# Patient Record
Sex: Female | Born: 1950 | Race: White | Hispanic: No | Smoking: Never smoker
Health system: Southern US, Community
[De-identification: ages and names within clinical notes are randomized; demographics above are authoritative.]

## PROBLEM LIST (undated history)

## (undated) DIAGNOSIS — R600 Localized edema: Secondary | ICD-10-CM

## (undated) DIAGNOSIS — G8929 Other chronic pain: Secondary | ICD-10-CM

## (undated) HISTORY — PX: BACK SURGERY: SHX140

## (undated) HISTORY — DX: Localized edema: R60.0

## (undated) HISTORY — PX: GASTRIC BYPASS: SHX52

---

## 1987-04-17 HISTORY — PX: ABDOMINAL HYSTERECTOMY: SHX81

## 2004-12-09 ENCOUNTER — Emergency Department: Payer: Self-pay | Admitting: Emergency Medicine

## 2006-05-09 ENCOUNTER — Ambulatory Visit: Payer: Self-pay | Admitting: Gastroenterology

## 2006-05-30 ENCOUNTER — Ambulatory Visit: Payer: Self-pay | Admitting: Gastroenterology

## 2007-05-13 ENCOUNTER — Ambulatory Visit: Payer: Self-pay | Admitting: General Practice

## 2008-06-02 ENCOUNTER — Ambulatory Visit: Payer: Self-pay | Admitting: General Practice

## 2009-09-14 ENCOUNTER — Ambulatory Visit: Payer: Self-pay | Admitting: Oncology

## 2009-09-19 ENCOUNTER — Emergency Department: Payer: Self-pay | Admitting: Emergency Medicine

## 2009-09-26 ENCOUNTER — Emergency Department: Payer: Self-pay | Admitting: Emergency Medicine

## 2009-10-14 ENCOUNTER — Ambulatory Visit: Payer: Self-pay | Admitting: Oncology

## 2009-10-21 ENCOUNTER — Emergency Department: Payer: Self-pay | Admitting: Emergency Medicine

## 2009-11-03 ENCOUNTER — Ambulatory Visit: Payer: Self-pay | Admitting: Oncology

## 2009-11-14 ENCOUNTER — Ambulatory Visit: Payer: Self-pay | Admitting: Oncology

## 2009-12-15 ENCOUNTER — Ambulatory Visit: Payer: Self-pay | Admitting: Oncology

## 2009-12-15 ENCOUNTER — Encounter: Payer: Self-pay | Admitting: Orthopedic Surgery

## 2010-01-14 ENCOUNTER — Emergency Department: Payer: Self-pay | Admitting: Emergency Medicine

## 2010-05-18 ENCOUNTER — Ambulatory Visit: Payer: Self-pay | Admitting: Oncology

## 2010-06-15 ENCOUNTER — Ambulatory Visit: Payer: Self-pay | Admitting: Oncology

## 2010-11-26 ENCOUNTER — Ambulatory Visit: Payer: Self-pay

## 2011-10-02 ENCOUNTER — Ambulatory Visit: Payer: Self-pay | Admitting: Family Medicine

## 2012-12-05 DIAGNOSIS — G8929 Other chronic pain: Secondary | ICD-10-CM | POA: Insufficient documentation

## 2012-12-11 ENCOUNTER — Emergency Department: Payer: Self-pay | Admitting: Emergency Medicine

## 2012-12-11 LAB — CBC
HCT: 36.8 % (ref 35.0–47.0)
HGB: 12.9 g/dL (ref 12.0–16.0)
MCH: 31.5 pg (ref 26.0–34.0)
MCHC: 35.2 g/dL (ref 32.0–36.0)
MCV: 90 fL (ref 80–100)
Platelet: 196 10*3/uL (ref 150–440)
RBC: 4.1 10*6/uL (ref 3.80–5.20)
RDW: 13.7 % (ref 11.5–14.5)
WBC: 4.3 10*3/uL (ref 3.6–11.0)

## 2012-12-11 LAB — BASIC METABOLIC PANEL
Anion Gap: 3 — ABNORMAL LOW (ref 7–16)
BUN: 13 mg/dL (ref 7–18)
Calcium, Total: 9.3 mg/dL (ref 8.5–10.1)
Chloride: 102 mmol/L (ref 98–107)
Co2: 31 mmol/L (ref 21–32)
Creatinine: 0.7 mg/dL (ref 0.60–1.30)
EGFR (African American): >60
EGFR (Non-African Amer.): >60
Glucose: 121 mg/dL — ABNORMAL HIGH (ref 65–99)
Osmolality: 273 (ref 275–301)
Potassium: 4 mmol/L (ref 3.5–5.1)
Sodium: 136 mmol/L (ref 136–145)

## 2012-12-11 LAB — TROPONIN I: Troponin-I: <0.02 ng/mL

## 2012-12-11 LAB — CK TOTAL AND CKMB (NOT AT ARMC)
CK, Total: 74 U/L (ref 21–215)
CK-MB: 0.9 ng/mL (ref 0.5–3.6)

## 2012-12-11 LAB — PROTIME-INR
INR: 1
Prothrombin Time: 13.1 secs (ref 11.5–14.7)

## 2012-12-30 ENCOUNTER — Ambulatory Visit: Payer: Self-pay | Admitting: Family Medicine

## 2014-10-11 DIAGNOSIS — M1812 Unilateral primary osteoarthritis of first carpometacarpal joint, left hand: Secondary | ICD-10-CM | POA: Diagnosis not present

## 2014-10-13 DIAGNOSIS — H6123 Impacted cerumen, bilateral: Secondary | ICD-10-CM | POA: Diagnosis not present

## 2014-10-13 DIAGNOSIS — H6092 Unspecified otitis externa, left ear: Secondary | ICD-10-CM | POA: Diagnosis not present

## 2014-12-06 DIAGNOSIS — M1812 Unilateral primary osteoarthritis of first carpometacarpal joint, left hand: Secondary | ICD-10-CM | POA: Diagnosis not present

## 2015-02-06 ENCOUNTER — Encounter: Payer: Self-pay | Admitting: Adult Health

## 2015-02-06 ENCOUNTER — Emergency Department: Payer: BLUE CROSS/BLUE SHIELD

## 2015-02-06 ENCOUNTER — Emergency Department
Admission: EM | Admit: 2015-02-06 | Discharge: 2015-02-06 | Disposition: A | Discharge status: Home / Self Care | Payer: BLUE CROSS/BLUE SHIELD | Attending: Emergency Medicine | Admitting: Emergency Medicine

## 2015-02-06 DIAGNOSIS — S6992XA Unspecified injury of left wrist, hand and finger(s), initial encounter: Secondary | ICD-10-CM | POA: Diagnosis not present

## 2015-02-06 DIAGNOSIS — S60212A Contusion of left wrist, initial encounter: Secondary | ICD-10-CM

## 2015-02-06 DIAGNOSIS — S40021A Contusion of right upper arm, initial encounter: Secondary | ICD-10-CM

## 2015-02-06 DIAGNOSIS — S40011A Contusion of right shoulder, initial encounter: Secondary | ICD-10-CM | POA: Diagnosis not present

## 2015-02-06 DIAGNOSIS — F419 Anxiety disorder, unspecified: Secondary | ICD-10-CM | POA: Insufficient documentation

## 2015-02-06 DIAGNOSIS — S0081XA Abrasion of other part of head, initial encounter: Secondary | ICD-10-CM | POA: Insufficient documentation

## 2015-02-06 DIAGNOSIS — S29012A Strain of muscle and tendon of back wall of thorax, initial encounter: Secondary | ICD-10-CM | POA: Insufficient documentation

## 2015-02-06 DIAGNOSIS — M25511 Pain in right shoulder: Secondary | ICD-10-CM | POA: Diagnosis not present

## 2015-02-06 DIAGNOSIS — M549 Dorsalgia, unspecified: Secondary | ICD-10-CM | POA: Diagnosis not present

## 2015-02-06 DIAGNOSIS — S161XXA Strain of muscle, fascia and tendon at neck level, initial encounter: Secondary | ICD-10-CM | POA: Insufficient documentation

## 2015-02-06 DIAGNOSIS — M25532 Pain in left wrist: Secondary | ICD-10-CM | POA: Diagnosis not present

## 2015-02-06 DIAGNOSIS — W01198A Fall on same level from slipping, tripping and stumbling with subsequent striking against other object, initial encounter: Secondary | ICD-10-CM | POA: Diagnosis not present

## 2015-02-06 DIAGNOSIS — S39012A Strain of muscle, fascia and tendon of lower back, initial encounter: Secondary | ICD-10-CM | POA: Insufficient documentation

## 2015-02-06 DIAGNOSIS — S199XXA Unspecified injury of neck, initial encounter: Secondary | ICD-10-CM | POA: Diagnosis not present

## 2015-02-06 DIAGNOSIS — Y998 Other external cause status: Secondary | ICD-10-CM | POA: Insufficient documentation

## 2015-02-06 DIAGNOSIS — S299XXA Unspecified injury of thorax, initial encounter: Secondary | ICD-10-CM | POA: Diagnosis not present

## 2015-02-06 DIAGNOSIS — W19XXXA Unspecified fall, initial encounter: Secondary | ICD-10-CM

## 2015-02-06 DIAGNOSIS — R52 Pain, unspecified: Secondary | ICD-10-CM

## 2015-02-06 DIAGNOSIS — IMO0001 Reserved for inherently not codable concepts without codable children: Secondary | ICD-10-CM

## 2015-02-06 DIAGNOSIS — Y9389 Activity, other specified: Secondary | ICD-10-CM | POA: Insufficient documentation

## 2015-02-06 DIAGNOSIS — S29019A Strain of muscle and tendon of unspecified wall of thorax, initial encounter: Secondary | ICD-10-CM

## 2015-02-06 DIAGNOSIS — Y92002 Bathroom of unspecified non-institutional (private) residence single-family (private) house as the place of occurrence of the external cause: Secondary | ICD-10-CM | POA: Insufficient documentation

## 2015-02-06 DIAGNOSIS — M542 Cervicalgia: Secondary | ICD-10-CM | POA: Diagnosis not present

## 2015-02-06 DIAGNOSIS — S3992XA Unspecified injury of lower back, initial encounter: Secondary | ICD-10-CM | POA: Diagnosis not present

## 2015-02-06 HISTORY — DX: Other chronic pain: G89.29

## 2015-02-06 MED ORDER — BACITRACIN ZINC 500 UNIT/GM EX OINT
TOPICAL_OINTMENT | CUTANEOUS | Status: AC
Start: 2015-02-06 — End: 2015-02-06
  Administered 2015-02-06: 1 via TOPICAL
  Filled 2015-02-06: qty 0.9

## 2015-02-06 MED ORDER — BACITRACIN ZINC 500 UNIT/GM EX OINT
TOPICAL_OINTMENT | CUTANEOUS | Status: AC
  Administered 2015-02-06: 1 via TOPICAL

## 2015-02-06 MED ORDER — MORPHINE SULFATE (PF) 4 MG/ML IV SOLN
4.0000 mg | Freq: Once | INTRAVENOUS | Status: AC
  Administered 2015-02-06: 4 mg via INTRAVENOUS
  Filled 2015-02-06: qty 1

## 2015-02-06 MED ORDER — MORPHINE SULFATE (PF) 2 MG/ML IV SOLN
2.0000 mg | Freq: Once | INTRAVENOUS | Status: AC
  Administered 2015-02-06: 2 mg via INTRAVENOUS
  Filled 2015-02-06: qty 1

## 2015-02-06 MED ORDER — ONDANSETRON HCL 4 MG/2ML IJ SOLN
4.0000 mg | Freq: Once | INTRAMUSCULAR | Status: AC
  Administered 2015-02-06: 4 mg via INTRAVENOUS
  Filled 2015-02-06: qty 2

## 2015-02-06 NOTE — Discharge Instructions (Signed)
You were evaluated after fall and found to have abrasion of the face, bruising and/or strain to the neck, back, right shoulder and left wrist.  Return to the emergency room for any worsening condition including weakness, numbness, confusion altered mental status, chest pain, abdominal pain, or any other symptoms concerning to you.   Abrasion An abrasion is a cut or scrape on the surface of your skin. An abrasion does not go through all of the layers of your skin. It is important to take good care of your abrasion to prevent infection. HOME CARE Medicines  Take or apply medicines only as told by your doctor.  If you were prescribed an antibiotic ointment, finish all of it even if you start to feel better. Wound Care  Clean the wound with mild soap and water 2-3 times per day or as told by your doctor. Pat your wound dry with a clean towel. Do not rub it.  There are many ways to close and cover a wound. Follow instructions from your doctor about:  How to take care of your wound.  When and how you should change your bandage (dressing).  When and how you should take off your dressing.  Check your wound every day for signs of infection. Watch for:  Redness, swelling, or pain.  Fluid, blood, or pus. General Instructions  Keep the dressing dry as told by your doctor. Do not take baths, swim, use a hot tub, or do anything that would put your wound underwater until your doctor says it is okay.  If there is swelling, raise (elevate) the injured area above the level of your heart while you are sitting or lying down.  Keep all follow-up visits as told by your doctor. This is important. GET HELP IF:  You were given a tetanus shot and you have any of these where the needle went in:  Swelling.  Very bad pain.  Redness.  Bleeding.  Medicine does not help your pain.  You have any of these at the site of the wound:  More redness.  More swelling.  More pain. GET HELP RIGHT AWAY  IF:  You have a red streak going away from your wound.  You have a fever.  You have fluid, blood, or pus coming from your wound.  There is a bad smell coming from your wound.   This information is not intended to replace advice given to you by your health care provider. Make sure you discuss any questions you have with your health care provider.   Document Released: 09/19/2007 Document Revised: 08/17/2014 Document Reviewed: 03/31/2014 Elsevier Interactive Patient Education 2016 [employer]  Cervical Sprain A cervical sprain is when the tissues (ligaments) that hold the neck bones in place stretch or tear. HOME CARE   Put ice on the injured area.  Put ice in a plastic bag.  Place a towel between your skin and the bag.  Leave the ice on for 15-20 minutes, 3-4 times a day.  You may have been given a collar to wear. This collar keeps your neck from moving while you heal.  Do not take the collar off unless told by your doctor.  If you have long hair, keep it outside of the collar.  Ask your doctor before changing the position of your collar. You may need to change its position over time to make it more comfortable.  If you are allowed to take off the collar for cleaning or bathing, follow your doctor's instructions on  how to do it safely.  Keep your collar clean by wiping it with mild soap and water. Dry it completely. If the collar has removable pads, remove them every 1-2 days to hand wash them with soap and water. Allow them to air dry. They should be dry before you wear them in the collar.  Do not drive while wearing the collar.  Only take medicine as told by your doctor.  Keep all doctor visits as told.  Keep all physical therapy visits as told.  Adjust your work station so that you have good posture while you work.  Avoid positions and activities that make your problems worse.  Warm up and stretch before being active. GET HELP IF:  Your pain is not controlled  with medicine.  You cannot take less pain medicine over time as planned.  Your activity level does not improve as expected. GET HELP RIGHT AWAY IF:   You are bleeding.  Your stomach is upset.  You have an allergic reaction to your medicine.  You develop new problems that you cannot explain.  You lose feeling (become numb) or you cannot move any part of your body (paralysis).  You have tingling or weakness in any part of your body.  Your symptoms get worse. Symptoms include:  Pain, soreness, stiffness, puffiness (swelling), or a burning feeling in your neck.  Pain when your neck is touched.  Shoulder or upper back pain.  Limited ability to move your neck.  Headache.  Dizziness.  Your hands or arms feel week, lose feeling, or tingle.  Muscle spasms.  Difficulty swallowing or chewing. MAKE SURE YOU:   Understand these instructions.  Will watch your condition.  Will get help right away if you are not doing well or get worse.   This information is not intended to replace advice given to you by your health care provider. Make sure you discuss any questions you have with your health care provider.   Document Released: 09/19/2007 Document Revised: 12/03/2012 Document Reviewed: 10/08/2012 Elsevier Interactive Patient Education 2016 [place] A contusion is a deep bruise. Contusions are the result of a blunt injury to tissues and muscle fibers under the skin. The injury causes bleeding under the skin. The skin overlying the contusion may turn blue, purple, or yellow. Minor injuries will give you a painless contusion, but more severe contusions may stay painful and swollen for a few weeks.  CAUSES  This condition is usually caused by a blow, trauma, or direct force to an area of the body. SYMPTOMS  Symptoms of this condition include:  Swelling of the injured area.  Pain and tenderness in the injured area.  Discoloration. The area may have redness and  then turn blue, purple, or yellow. DIAGNOSIS  This condition is diagnosed based on a physical exam and medical history. An X-ray, CT scan, or MRI may be needed to determine if there are any associated injuries, such as broken bones (fractures). TREATMENT  Specific treatment for this condition depends on what area of the body was injured. In general, the best treatment for a contusion is resting, icing, applying pressure to (compression), and elevating the injured area. This is often called the RICE strategy. Over-the-counter anti-inflammatory medicines may also be recommended for pain control.  HOME CARE INSTRUCTIONS   Rest the injured area.  If directed, apply ice to the injured area:  Put ice in a plastic bag.  Place a towel between your skin and the bag.  Leave  the ice on for 20 minutes, 2-3 times per day.  If directed, apply light compression to the injured area using an elastic bandage. Make sure the bandage is not wrapped too tightly. Remove and reapply the bandage as directed by your health care provider.  If possible, raise (elevate) the injured area above the level of your heart while you are sitting or lying down.  Take over-the-counter and prescription medicines only as told by your health care provider. SEEK MEDICAL CARE IF:  Your symptoms do not improve after several days of treatment.  Your symptoms get worse.  You have difficulty moving the injured area. SEEK IMMEDIATE MEDICAL CARE IF:   You have severe pain.  You have numbness in a hand or foot.  Your hand or foot turns pale or cold.   This information is not intended to replace advice given to you by your health care provider. Make sure you discuss any questions you have with your health care provider.   Document Released: 01/10/2005 Document Revised: 12/22/2014 Document Reviewed: 08/18/2014 Elsevier Interactive Patient Education [employer].

## 2015-02-06 NOTE — ED Notes (Signed)
Pt. Was on the second floor of waiting in ICU.  Pt. Stated she stood up to use bathroom and fell to knees and forward striking face on floor.  Pt. Has abrasion to forehead and bridge of nose.  Pt. Also states pain to neck and lt. Wrist.

## 2015-02-06 NOTE — ED Notes (Signed)
MD at bedside. 

## 2015-02-06 NOTE — ED Notes (Signed)
Helped pt. To bathroom by taking stretcher into bathroom, with assistance pt. Able to stand and use bathroom in room.Marland Kitchen

## 2015-02-06 NOTE — ED Notes (Signed)
Pt alert and oriented X4, active, cooperative, pt in NAD. RR even and unlabored, color WNL.  Pt wheeled back to CCU by ED Tech to be with admitted husband.

## 2015-02-06 NOTE — ED Notes (Addendum)
Pt here with husband who is a pt in CCU post arrest, while sitting on a bench in the waiting room-she states she dozed off and remembers falling forward off bench and landing on carpeted floor. abraision to forehead, nose and c/o right sided neck pain. Pt is tearful and would like to go back upstairs to be with husband. Denies blood thinners.

## 2015-02-06 NOTE — ED Notes (Signed)
Pt states 10/10 pain in left arm, right shoulder right neck and face. Pt tearful.

## 2015-02-06 NOTE — ED Provider Notes (Signed)
[facility] Emergency Department Provider Note   ____________________________________________  Time seen: 7 AM I have reviewed the triage vital signs and the triage nursing note.  HISTORY  Chief Complaint Fall   Historian Patient  HPI Morgan Dixon is a 64 y.o. female who is here for evaluation after fall. Patient was reportedly in the low bleeding return to the ICU where her husband is patient, when she stood up to use the bathroom she felt weak and fell down to her knees and then fell forward striking her face. Patient complaining of right shoulder pain, left wrist pain, and pain "all the way down "her spine. She does have a history of prior spinal surgeries. She denies loss of consciousness. Floor is carpeted.    Past Medical History  Diagnosis Date  . Chronic pain     There are no active problems to display for this patient.   No past surgical history on file. spinal surgeries  No current outpatient prescriptions on file. buprenorphine for chronic pain.  Allergies Prozac  No family history on file.  Social History Social History  Substance Use Topics  . Smoking status: Not on file  . Smokeless tobacco: Not on file  . Alcohol Use: Not on file   lives at home with husband.  Review of Systems  Constitutional: Negative for fever. Eyes: Negative for visual changes. ENT: Negative for sore throat. Cardiovascular: Negative for chest pain. Respiratory: Negative for shortness of breath. Gastrointestinal: Negative for abdominal pain, vomiting and diarrhea. Genitourinary: Negative for dysuria. Musculoskeletal: Positive for back pain. Skin: Negative for rash. Neurological: Negative for headache. Negative for new weakness or numbness. 10 point Review of Systems otherwise negative ____________________________________________   PHYSICAL EXAM:  VITAL SIGNS: ED Triage Vitals  Enc Vitals Group     BP 02/06/15 0515 150/117 mmHg     Pulse  Rate 02/06/15 0515 100     Resp 02/06/15 0515 18     Temp 02/06/15 0515 98.5 F (36.9 C)     Temp Source 02/06/15 0515 Oral     SpO2 02/06/15 0515 97 %     Weight --      Height --      Head Cir --      Peak Flow --      Pain Score 02/06/15 0515 10     Pain Loc --      Pain Edu? --      Excl. in [place]? --      Constitutional: Alert and oriented. Tearful and anxious.  Eyes: Conjunctivae are normal. PERRL. Normal extraocular movements. ENT   Head: Normocephalic. Abrasion over forehead and bridge of nose.   Nose: No congestion/rhinnorhea.   Mouth/Throat: Mucous membranes are moist. No jaw malocclusion   Neck: No stridor. Mild tenderness to palpation paraspinous and base the neck. No C-spine step-off. Cardiovascular/Chest: Normal rate, regular rhythm.  No murmurs, rubs, or gallops. Respiratory: Normal respiratory effort without tachypnea nor retractions. Breath sounds are clear and equal bilaterally. No wheezes/rales/rhonchi. Gastrointestinal: Soft. No distention, no guarding, no rebound. Nontender  Genitourinary/rectal:Deferred Musculoskeletal: Pelvis stable. Nontender range of motion of the hips and lower extremities. Patient has some tenderness over the anterior joint margin of the right shoulder and pain with active range of motion. No deformity. Neurovascularly intact bilaterally upper extremities. Left wrist tender with active range of motion, but no point tenderness on palpation or deformity. Neurologic:  Normal speech and language. No gross or focal neurologic deficits are appreciated. Skin:  Skin is warm, dry and intact. No rash noted.   ____________________________________________   EKG I, Lisa Roca, MD, the attending physician have personally viewed and interpreted all ECGs.   No EKG performed ____________________________________________  LABS (pertinent positives/negatives)  None  ____________________________________________  RADIOLOGY All Xrays  were viewed by me. Imaging interpreted by Radiologist.  C-spine x-ray completed: No evidence of fracture or subluxation along the cervical spine.  Left wrist complete: No acute osseous abnormalities. Mild degenerative changes at radial border of the carpus  Right shoulder:  No acute osseous abnormalities  Lumbar spine: Osseous demineralization with degenerative disc disease changes. No acute abnormalities  Thoracic spine:  Diffuse spondylosis and degenerative disc disease. No acute finding by plain radiography __________________________________________  PROCEDURES  Procedure(s) performed: None  Critical Care performed: None  ____________________________________________   ED COURSE / ASSESSMENT AND PLAN  CONSULTATIONS: None  Pertinent labs & imaging results that were available during my care of the patient were reviewed by me and considered in my medical decision making (see chart for details).  Fall sounds mechanical/orthostatic. I'm not suspicious of an underlying cardiac or neurologic event leading to the fall. In terms of trauma, patient is not on blood thinners, and had no loss of consciousness, has neurologically intact exam. She does have some abrasions over her forehead and nose, but not suspicious of an intracranial traumatic injury.  She is complaining of pain from her cervical spine and thoracic spine and lumbar spine which I suspect is likely strain, however was imaged to make sure there is no fracture.  Other extremity pain included right shoulder and left wrist. X-rays negative, consistent with contusion and/or strain.  Patient really wants to go back upstairs to be with her husband who is in ICU. Patient instructed to be very careful when she sitting for long periods at times and uncomfortable chairs, etc., to take care with standing and walking to prevent additional fall/injuries.  Patient / Family / Caregiver informed of clinical course, medical decision-making  process, and agree with plan.   I discussed return precautions, follow-up instructions, and discharged instructions with patient and/or family.  ___________________________________________   FINAL CLINICAL IMPRESSION(S) / ED DIAGNOSES   Final diagnoses:  Pain  Facial abrasion, initial encounter  Contusion shoulder/arm, right, initial encounter  Contusion, wrist, left, initial encounter  Strain, cervical, initial encounter  Strain of thoracic spine, initial encounter  Strain of lumbar spine, initial encounter       Lisa Roca, MD 02/06/15 [phone]

## 2015-02-06 NOTE — ED Notes (Signed)
Pt family at bedside with patient. Pt states pain 7/10 at this time. Denies further needs.

## 2015-02-06 NOTE — ED Notes (Signed)
Report from Matt, RN

## 2015-02-06 NOTE — ED Notes (Signed)
Pt tearful, states that she wants to go back to CCU to be with her husband

## 2015-02-14 DIAGNOSIS — F1190 Opioid use, unspecified, uncomplicated: Secondary | ICD-10-CM | POA: Diagnosis present

## 2015-03-02 ENCOUNTER — Other Ambulatory Visit: Payer: Self-pay

## 2015-05-27 ENCOUNTER — Other Ambulatory Visit: Payer: Self-pay | Admitting: Nurse Practitioner

## 2015-05-27 DIAGNOSIS — Z1231 Encounter for screening mammogram for malignant neoplasm of breast: Secondary | ICD-10-CM

## 2015-06-09 ENCOUNTER — Ambulatory Visit
Admission: RE | Admit: 2015-06-09 | Discharge: 2015-06-09 | Disposition: A | Discharge status: Home / Self Care | Payer: BLUE CROSS/BLUE SHIELD | Source: Ambulatory Visit | Attending: Nurse Practitioner | Admitting: Nurse Practitioner

## 2015-06-09 DIAGNOSIS — Z1231 Encounter for screening mammogram for malignant neoplasm of breast: Secondary | ICD-10-CM | POA: Diagnosis not present

## 2015-09-06 ENCOUNTER — Other Ambulatory Visit: Payer: Self-pay

## 2017-05-16 DIAGNOSIS — Z9689 Presence of other specified functional implants: Secondary | ICD-10-CM | POA: Insufficient documentation

## 2017-07-11 ENCOUNTER — Other Ambulatory Visit: Payer: Self-pay | Admitting: Physician Assistant

## 2017-07-11 DIAGNOSIS — Z1239 Encounter for other screening for malignant neoplasm of breast: Secondary | ICD-10-CM

## 2017-12-12 ENCOUNTER — Ambulatory Visit (INDEPENDENT_AMBULATORY_CARE_PROVIDER_SITE_OTHER): Payer: Medicare Other | Admitting: Vascular Surgery

## 2017-12-12 ENCOUNTER — Encounter (INDEPENDENT_AMBULATORY_CARE_PROVIDER_SITE_OTHER): Payer: Self-pay | Admitting: Vascular Surgery

## 2017-12-12 VITALS — BP 105/57 | HR 68 | Resp 16 | Ht 66.0 in | Wt 238.8 lb

## 2017-12-12 DIAGNOSIS — M79604 Pain in right leg: Secondary | ICD-10-CM | POA: Insufficient documentation

## 2017-12-12 DIAGNOSIS — R600 Localized edema: Secondary | ICD-10-CM

## 2017-12-12 DIAGNOSIS — M79605 Pain in left leg: Secondary | ICD-10-CM | POA: Diagnosis not present

## 2017-12-12 NOTE — Progress Notes (Signed)
Subjective:    Patient ID: Morgan Dixon, female    DOB: 1950/07/24, 67 y.o.   MRN: [id number] Chief Complaint  Patient presents with  . New Patient (Initial Visit)    ref Fath le edema   Presents as a new patient referred by Dr. Ubaldo Glassing for evaluation of bilateral lower extremity pain and edema.  The patient endorses a long-standing history of swelling to the bilateral legs.  The patient notes that over the last few months, the swelling has worsened.  The patient denies any recent surgery or trauma.  The patient's edema is associated with some discomfort.  The patient notes that she experiences this discomfort at night and describes it as a throbbing in her bilateral calves.  The patient has noticed a reddish discoloration to the bilateral calves.  And what looks like "hard bubbles".  The patient denies any DVT history.  The patient feels that her symptoms have progressed to the point that she is unable to function on a daily basis and they have become lifestyle limiting.  The patient feels that the excessive amount of edema in her legs is making it harder for her to ambulate.  At this time, the patient is not engaging conservative therapy including wearing medical grade 1 compression socks, elevating her legs are remaining active.  The patient denies any recent bouts of cellulitis, nausea, vomiting or fever.  Review of Systems  Constitutional: Negative.   HENT: Negative.   Eyes: Negative.   Respiratory: Negative.   Cardiovascular: Positive for leg swelling.       Lower Extremity Pain  Gastrointestinal: Negative.   Endocrine: Negative.   Genitourinary: Negative.   Musculoskeletal: Negative.   Skin: Negative.   Allergic/Immunologic: Negative.   Neurological: Negative.   Hematological: Negative.   Psychiatric/Behavioral: Negative.       Objective:   Physical Exam  Constitutional: She is oriented to person, place, and time. She appears well-developed and well-nourished. No distress.    HENT:  Head: Normocephalic and atraumatic.  Right Ear: External ear normal.  Left Ear: External ear normal.  Eyes: Pupils are equal, round, and reactive to light. Conjunctivae and EOM are normal.  Neck: Normal range of motion.  Cardiovascular: Normal rate, regular rhythm, normal heart sounds and intact distal pulses.  Pulses:      Radial pulses are 2+ on the right side, and 2+ on the left side.  Unable to palpate pedal pulses due to body habitus and edema however the bilateral feet are warm  Pulmonary/Chest: Effort normal and breath sounds normal.  Musculoskeletal: Normal range of motion. She exhibits edema (Right lower extremity: Moderate nonpitting edema noted.  Left lower extremity: Moderate to severe edema noted nonpitting.).  Neurological: She is alert and oriented to person, place, and time.  Skin: She is not diaphoretic.  There is severe stasis dermatitis noted to the bilateral calves.  Mild fibrosis.  There is no active cellulitis or ulceration at this time.  Minimal scattered less than 1 cm varicosities noted bilaterally  Psychiatric: She has a normal mood and affect. Her behavior is normal. Judgment and thought content normal.  Vitals reviewed.  BP (!) 105/57 (BP Location: Right Arm)   Pulse 68   Resp 16   Ht 5\' 6"  (1.676 m)   Wt 238 lb 12.8 oz (108.3 kg)   BMI 38.54 kg/m   Past Medical History:  Diagnosis Date  . Chronic pain    Social History   Socioeconomic History  . Marital  status: Married    Spouse name: Not on file  . Number of children: Not on file  . Years of education: Not on file  . Highest education level: Not on file  Occupational History  . Not on file  Social Needs  . Financial resource strain: Not on file  . Food insecurity:    Worry: Not on file    Inability: Not on file  . Transportation needs:    Medical: Not on file    Non-medical: Not on file  Tobacco Use  . Smoking status: Never Smoker  . Smokeless tobacco: Never Used  Substance and  Sexual Activity  . Alcohol use: Never    Frequency: Never  . Drug use: Never  . Sexual activity: Not on file  Lifestyle  . Physical activity:    Days per week: Not on file    Minutes per session: Not on file  . Stress: Not on file  Relationships  . Social connections:    Talks on phone: Not on file    Gets together: Not on file    Attends religious service: Not on file    Active member of club or organization: Not on file    Attends meetings of clubs or organizations: Not on file    Relationship status: Not on file  . Intimate partner violence:    Fear of current or ex partner: Not on file    Emotionally abused: Not on file    Physically abused: Not on file    Forced sexual activity: Not on file  Other Topics Concern  . Not on file  Social History Narrative  . Not on file   Past Surgical History:  Procedure Laterality Date  . ABDOMINAL HYSTERECTOMY  1989   Family History  Problem Relation Age of Onset  . Breast cancer Maternal Aunt 60   Allergies  Allergen Reactions  . Diazepam Rash  . Penicillins Itching  . Latex Itching    When used gloves in the past  . Prozac [Fluoxetine Hcl] Itching and Rash      Assessment & Plan:  Presents as a new patient referred by Dr. Ubaldo Glassing for evaluation of bilateral lower extremity pain and edema.  The patient endorses a long-standing history of swelling to the bilateral legs.  The patient notes that over the last few months, the swelling has worsened.  The patient denies any recent surgery or trauma.  The patient's edema is associated with some discomfort.  The patient notes that she experiences this discomfort at night and describes it as a throbbing in her bilateral calves.  The patient has noticed a reddish discoloration to the bilateral calves.  And what looks like "hard bubbles".  The patient denies any DVT history.  The patient feels that her symptoms have progressed to the point that she is unable to function on a daily basis and they  have become lifestyle limiting.  The patient feels that the excessive amount of edema in her legs is making it harder for her to ambulate.  At this time, the patient is not engaging conservative therapy including wearing medical grade 1 compression socks, elevating her legs are remaining active.  The patient denies any recent bouts of cellulitis, nausea, vomiting or fever.  1. Bilateral lower extremity edema - New Due to the patient's moderate to severe bilateral lower extremity edema in an effort to gain control I will place her in 3 layers zinc oxide and wraps. The patient is to come  in for weekly bilateral lower extremity 3 layer of zinc oxide and wraps to the bilateral lower examinee for approximately one month At her one month follow-up we will have her undergo a bilateral lower extremity venous duplex to rule out any contributing venous versus lymphatic disease Once I have gained control of the patient's edema I will transition her into medical grade 1 compression socks In addition, behavioral modification including elevation during the day will be initiated. The patient was instructed to call the office in the interim if any worsening edema or ulcerations to the legs, feet or toes occurs. The patient expresses their understanding.  - VAS [place] LOWER EXTREMITY VENOUS REFLUX; Future  2. Pain in both lower extremities - New Patient with multiple risk factors for peripheral artery disease Unable to palpate pedal pulses on exam Patient with cramping to the bilateral calves I will have the patient come back to the office and undergo bilateral ABI to assess for any contributing peripheral artery disease I have discussed with the patient at length the risk factors for and pathogenesis of atherosclerotic disease and encouraged a healthy diet, regular exercise regimen and blood pressure / glucose control.  The patient was encouraged to call the office in the interim if he experiences any claudication  like symptoms, rest pain or ulcers to his feet / toes.  - VAS [place] ABI WITH/WO TBI; Future  Current Outpatient Medications on File Prior to Visit  Medication Sig Dispense Refill  . aspirin EC 81 MG tablet Take by mouth.    . buprenorphine (SUBUTEX) 8 MG SUBL SL tablet   0  . furosemide (LASIX) 80 MG tablet Take by mouth.    Marland Kitchen lisinopril (PRINIVIL,ZESTRIL) 10 MG tablet Take by mouth.    Derrill Memo ON 01/27/2018] tapentadol HCl (NUCYNTA) 75 MG tablet 1-2 po TID prn     No current facility-administered medications on file prior to visit.    There are no Patient Instructions on file for this visit. No follow-ups on file.  Morgan Michl A Xinyi Batton, PA-C

## 2017-12-17 ENCOUNTER — Telehealth (INDEPENDENT_AMBULATORY_CARE_PROVIDER_SITE_OTHER): Payer: Self-pay

## 2017-12-17 NOTE — Telephone Encounter (Signed)
The patient should come in today and change from the zinc oxide 3 layer [place] wrap to the calamine.  She can also take a Benadryl.

## 2017-12-17 NOTE — Telephone Encounter (Signed)
Patient called and stated that she is having severe irritation to her legs since the Unna boots have been applied.   She would like to know what she needs to do or take to help alleviate the irritation, redness, and itching?

## 2017-12-17 NOTE — Telephone Encounter (Signed)
Called the patient back to offer her an appointment for today, she couldn't take that but she could do Wednesday morning. I offered her first appointment on Wednesday, and she accepted. She agrees to take a Benadryl today when we hang up and one before bedtime.

## 2017-12-18 ENCOUNTER — Ambulatory Visit (INDEPENDENT_AMBULATORY_CARE_PROVIDER_SITE_OTHER): Payer: BLUE CROSS/BLUE SHIELD | Admitting: Nurse Practitioner

## 2017-12-18 ENCOUNTER — Encounter (INDEPENDENT_AMBULATORY_CARE_PROVIDER_SITE_OTHER): Payer: Self-pay

## 2017-12-18 VITALS — BP 125/60 | HR 70 | Resp 13 | Ht 66.0 in | Wt 236.0 lb

## 2017-12-18 DIAGNOSIS — R600 Localized edema: Secondary | ICD-10-CM

## 2017-12-18 NOTE — Progress Notes (Signed)
History of Present Illness  There is no documented history at this time  Assessments & Plan   There are no diagnoses linked to this encounter.    Additional instructions  Subjective:  Patient presents with venous ulcer of the Bilateral lower extremity.    Procedure:  3 layer unna wrap was placed Bilateral lower extremity.   Plan:   Follow up in one week.  

## 2017-12-19 ENCOUNTER — Encounter (INDEPENDENT_AMBULATORY_CARE_PROVIDER_SITE_OTHER): Payer: Medicare Other

## 2017-12-25 ENCOUNTER — Ambulatory Visit (INDEPENDENT_AMBULATORY_CARE_PROVIDER_SITE_OTHER): Payer: BLUE CROSS/BLUE SHIELD | Admitting: Nurse Practitioner

## 2017-12-25 ENCOUNTER — Encounter (INDEPENDENT_AMBULATORY_CARE_PROVIDER_SITE_OTHER): Payer: Self-pay

## 2017-12-25 VITALS — BP 126/72 | HR 66 | Resp 16 | Ht 65.0 in | Wt 238.0 lb

## 2017-12-25 DIAGNOSIS — M79604 Pain in right leg: Secondary | ICD-10-CM

## 2017-12-25 DIAGNOSIS — M79605 Pain in left leg: Secondary | ICD-10-CM

## 2017-12-25 DIAGNOSIS — R600 Localized edema: Secondary | ICD-10-CM

## 2017-12-25 NOTE — Progress Notes (Signed)
History of Present Illness  There is no documented history at this time  Assessments & Plan   There are no diagnoses linked to this encounter.    Additional instructions  Subjective:  Patient presents with venous ulcer of the Bilateral lower extremity.    Procedure:  3 layer unna wrap was placed Bilateral lower extremity.   Plan:   Follow up in one week.  

## 2017-12-26 ENCOUNTER — Encounter (INDEPENDENT_AMBULATORY_CARE_PROVIDER_SITE_OTHER): Payer: Medicare Other

## 2018-01-01 ENCOUNTER — Ambulatory Visit (INDEPENDENT_AMBULATORY_CARE_PROVIDER_SITE_OTHER): Payer: BLUE CROSS/BLUE SHIELD | Admitting: Nurse Practitioner

## 2018-01-01 ENCOUNTER — Encounter (INDEPENDENT_AMBULATORY_CARE_PROVIDER_SITE_OTHER): Payer: Self-pay

## 2018-01-01 VITALS — BP 109/58 | HR 66 | Resp 17 | Ht 66.0 in | Wt 232.0 lb

## 2018-01-01 DIAGNOSIS — R600 Localized edema: Secondary | ICD-10-CM

## 2018-01-01 DIAGNOSIS — M79604 Pain in right leg: Secondary | ICD-10-CM

## 2018-01-01 DIAGNOSIS — M79605 Pain in left leg: Secondary | ICD-10-CM

## 2018-01-01 NOTE — Progress Notes (Signed)
History of Present Illness  There is no documented history at this time  Assessments & Plan   There are no diagnoses linked to this encounter.    Additional instructions  Subjective:  Patient presents with venous ulcer of the Bilateral lower extremity.    Procedure:  3 layer unna wrap was placed Bilateral lower extremity.   Plan:   Follow up in one week.  

## 2018-01-01 NOTE — Progress Notes (Signed)
Subjective:    Patient ID: Morgan Dixon, female    DOB: Sep 05, 1950, 67 y.o.   MRN: [id number] Chief Complaint  Patient presents with  . Follow-up    Bilateral Unna boot check    HPI  Morgan Dixon is a 67 y.o. female that is following up today for her 4-week recheck. The swelling has improved quite a bit and the pain associated with swelling has decreased substantially. There have not been any interval development of a ulcerations or wounds.  The patient states that she will obtain compression socks today.  We spoke about obtaining medical grade 1 compression stockings with 20 to 30 mmHg firmness.  The patient also states elevation during the day and exercise is being done too.  Patient denies any fever, nausea, vomiting, chills, diarrhea.  The patient denies any chest pain or shortness of breath.  Patient denies any claudication-like symptoms.     Review of Systems: Negative Unless Checked Constitutional: [] Weight loss  [] Fever  [] Chills Cardiac: [] Chest pain   [] Chest pressure   [] Palpitations   [] Shortness of breath when laying flat   [] Shortness of breath with exertion. Vascular:  [] Pain in legs with walking   [] Pain in legs with standing  [] History of DVT   [] Phlebitis   [] Swelling in legs   [] Varicose veins   [] Non-healing ulcers Pulmonary:   [] Uses home oxygen   [] Productive cough   [] Hemoptysis   [] Wheeze  [] COPD   [] Asthma Neurologic:  [] Dizziness   [] Seizures   [] History of stroke   [] History of TIA  [] Aphasia   [] Vissual changes   [] Weakness or numbness in arm   [] Weakness or numbness in leg Musculoskeletal:   [] Joint swelling   [] Joint pain   [] Low back pain Hematologic:  [] Easy bruising  [] Easy bleeding   [] Hypercoagulable state   [] Anemic Gastrointestinal:  [] Diarrhea   [] Vomiting  [] Gastroesophageal reflux/heartburn   [] Difficulty swallowing. Genitourinary:  [] Chronic kidney disease   [] Difficult urination  [] Frequent urination   [] Blood in urine Skin:   [] Rashes   [] Ulcers  Psychological:  [] History of anxiety   []  History of major depression.     Objective:   Physical Exam  BP (!) 109/58 (BP Location: Right Arm, Patient Position: Sitting)   Pulse 66   Resp 17   Ht 5\' 6"  (1.676 m)   Wt 232 lb (105.2 kg)   BMI 37.45 kg/m   Past Medical History:  Diagnosis Date  . Chronic pain   . Edema extremities      Gen: WD/WN, NAD Head: [place]/AT, No temporalis wasting.  Ear/Nose/Throat: Hearing grossly intact, nares w/o erythema or drainage Eyes: PER, EOMI, sclera nonicteric.  Neck: Supple, no masses.  No JVD.  Pulmonary:  Good air movement, no use of accessory muscles.  Cardiac: RRR Vascular:  Moderate nonpitting edema.  Venous stasis changes no evidence on bilateral calves.  Scattered varicosities noted bilaterally.  No active ulceration or cellulitis present.  Dermal thickening present bilaterally Vessel Right Left  Radial  2+  2+  Gastrointestinal: soft, non-distended. No guarding/no peritoneal signs.  Musculoskeletal: M/S 5/5 throughout.  No deformity or atrophy.  Neurologic: Pain and light touch intact in extremities.  Symmetrical.  Speech is fluent. Motor exam as listed above. Psychiatric: Judgment intact, Mood & affect appropriate for pt's clinical situation. Dermatologic: No Venous rashes. No Ulcers Noted.  No changes consistent with cellulitis. Lymph : No Cervical lymphadenopathy, no lichenification or skin changes of chronic lymphedema.   Social History  Socioeconomic History  . Marital status: Married    Spouse name: Not on file  . Number of children: Not on file  . Years of education: Not on file  . Highest education level: Not on file  Occupational History  . Not on file  Social Needs  . Financial resource strain: Not on file  . Food insecurity:    Worry: Not on file    Inability: Not on file  . Transportation needs:    Medical: Not on file    Non-medical: Not on file  Tobacco Use  . Smoking status: Never  Smoker  . Smokeless tobacco: Never Used  Substance and Sexual Activity  . Alcohol use: Never    Frequency: Never  . Drug use: Never  . Sexual activity: Not on file  Lifestyle  . Physical activity:    Days per week: Not on file    Minutes per session: Not on file  . Stress: Not on file  Relationships  . Social connections:    Talks on phone: Not on file    Gets together: Not on file    Attends religious service: Not on file    Active member of club or organization: Not on file    Attends meetings of clubs or organizations: Not on file    Relationship status: Not on file  . Intimate partner violence:    Fear of current or ex partner: Not on file    Emotionally abused: Not on file    Physically abused: Not on file    Forced sexual activity: Not on file  Other Topics Concern  . Not on file  Social History Narrative  . Not on file    Past Surgical History:  Procedure Laterality Date  . ABDOMINAL HYSTERECTOMY  1989    Family History  Problem Relation Age of Onset  . Breast cancer Maternal Aunt 60    Allergies  Allergen Reactions  . Diazepam Rash  . Penicillins Itching  . Rofecoxib Swelling  . Latex Itching    When used gloves in the past  . Prozac [Fluoxetine Hcl] Itching and Rash  . Zinc Oxide [Mexsana] Rash       Assessment & Plan:   1. Bilateral lower extremity edema The patient is still exhibiting edema today it is decreased from its previous state.  The patient wishes to come out of the Unna boots today to see how she will fare utilizing compression socks.  The patient has an appointment to follow-up with [place] in 1 week for [place] for bilateral lower extremity venous reflux.  We will assess her swelling at that time and whether further Unna boot intervention is necessary.  The patient was given a prescription for medical grade 120 to 30 mmHg compression socks today and instructed to begin wearing today.  She was also instructed to place compression socks on first  thing in the morning and remove them prior to bed in the evening.  She was instructed not to sleep in the compression socks.  Elevation and exercise were also emphasized.  Patient understood.  2. Pain in both lower extremities Patient states that the pain is greatly decreased.  The urinary wraps have helped greatly with her swelling, pain and discomfort.    Current Outpatient Medications on File Prior to Visit  Medication Sig Dispense Refill  . aspirin EC 81 MG tablet Take by mouth.    . buprenorphine (SUBUTEX) 8 MG SUBL SL tablet   0  . cyclobenzaprine (  FLEXERIL) 10 MG tablet TAKE 1 TABLET BY MOUTH TWICE DAILY AS NEEDED FOR MUSCLE SPASM    . diclofenac sodium (VOLTAREN) 1 % GEL   2  . furosemide (LASIX) 80 MG tablet Take by mouth.    . gabapentin (NEURONTIN) 300 MG capsule   3  . Liraglutide -Weight Management (SAXENDA) 18 MG/3ML SOPN     . lisinopril (PRINIVIL,ZESTRIL) 10 MG tablet Take by mouth.    . Multiple Vitamin (MULTIVITAMIN) capsule Take by mouth.    Derrill Memo ON 01/27/2018] tapentadol HCl (NUCYNTA) 75 MG tablet 1-2 po TID prn     No current facility-administered medications on file prior to visit.     There are no Patient Instructions on file for this visit. No follow-ups on file.   Kris Hartmann, NP

## 2018-01-02 ENCOUNTER — Encounter (INDEPENDENT_AMBULATORY_CARE_PROVIDER_SITE_OTHER): Payer: Medicare Other

## 2018-01-09 ENCOUNTER — Encounter (INDEPENDENT_AMBULATORY_CARE_PROVIDER_SITE_OTHER): Payer: Self-pay | Admitting: Vascular Surgery

## 2018-01-09 ENCOUNTER — Ambulatory Visit (INDEPENDENT_AMBULATORY_CARE_PROVIDER_SITE_OTHER): Payer: Medicare Other | Admitting: Vascular Surgery

## 2018-01-09 VITALS — BP 132/72 | HR 81 | Resp 16 | Ht 66.0 in | Wt 233.0 lb

## 2018-01-09 DIAGNOSIS — M79604 Pain in right leg: Secondary | ICD-10-CM

## 2018-01-09 DIAGNOSIS — R600 Localized edema: Secondary | ICD-10-CM

## 2018-01-09 DIAGNOSIS — M79605 Pain in left leg: Secondary | ICD-10-CM

## 2018-01-09 NOTE — Progress Notes (Signed)
Subjective:    Patient ID: Morgan Dixon, female    DOB: [birth year], 67 y.o.   MRN: [id number] Chief Complaint  Patient presents with  . Follow-up    unna check   The patient presents for a monthly Unna boot follow-up.  The patient has been undergoing weekly 3 layer zinc oxide Unna wrap therapy to the bilateral lower extremity for a lymphedema exacerbation.  The patient presents today with some improvement.  The patient has purchased a pair of medical grade 1 compression socks.  The patient continues to elevate her legs heart level or higher on a daily basis multiple times a day.  The patient is trying to remain active as possible.  The patient notes some improvement in her edema.  The patient notes that her "skin is looking better".  The patient still experiences some pain with ambulation particularly to the bilateral calves.  The patient denies any rest pain or ulcer formation to the bilateral lower extremity.  The patient denies any fever, nausea vomiting.  Review of Systems  Constitutional: Negative.   HENT: Negative.   Eyes: Negative.   Respiratory: Negative.   Cardiovascular: Positive for leg swelling.       Lower Extremity Pain  Gastrointestinal: Negative.   Endocrine: Negative.   Genitourinary: Negative.   Musculoskeletal: Negative.   Skin: Negative.   Allergic/Immunologic: Negative.   Neurological: Negative.   Hematological: Negative.   Psychiatric/Behavioral: Negative.       Objective:   Physical Exam  Constitutional: She is oriented to person, place, and time. She appears well-developed and well-nourished. No distress.  HENT:  Head: Normocephalic and atraumatic.  Right Ear: External ear normal.  Left Ear: External ear normal.  Eyes: Pupils are equal, round, and reactive to light. Conjunctivae and EOM are normal.  Neck: Normal range of motion.  Cardiovascular: Normal rate, regular rhythm, normal heart sounds and intact distal pulses.  Pulses:      Radial pulses  are 2+ on the right side, and 2+ on the left side.  Hard to palpate pedal pulses due to body habitus and edema however the bilateral feet are warm.  There is good capillary refill.  Pulmonary/Chest: Effort normal and breath sounds normal.  Musculoskeletal: Normal range of motion. She exhibits edema (Mild to moderate nonpitting edema bilaterally).  Neurological: She is alert and oriented to person, place, and time.  Skin: She is not diaphoretic.  There is an improvement patient skin.  She does have moderate stasis dermatitis noted to the bilateral calves.  Psychiatric: She has a normal mood and affect. Her behavior is normal. Judgment and thought content normal.  Vitals reviewed.  BP 132/72 (BP Location: Right Arm)   Pulse 81   Resp 16   Ht 5\' 6"  (1.676 m)   Wt 233 lb (105.7 kg)   BMI 37.61 kg/m   Past Medical History:  Diagnosis Date  . Chronic pain   . Edema extremities    Social History   Socioeconomic History  . Marital status: Married    Spouse name: Not on file  . Number of children: Not on file  . Years of education: Not on file  . Highest education level: Not on file  Occupational History  . Not on file  Social Needs  . Financial resource strain: Not on file  . Food insecurity:    Worry: Not on file    Inability: Not on file  . Transportation needs:    Medical: Not on file  Non-medical: Not on file  Tobacco Use  . Smoking status: Never Smoker  . Smokeless tobacco: Never Used  Substance and Sexual Activity  . Alcohol use: Never    Frequency: Never  . Drug use: Never  . Sexual activity: Not on file  Lifestyle  . Physical activity:    Days per week: Not on file    Minutes per session: Not on file  . Stress: Not on file  Relationships  . Social connections:    Talks on phone: Not on file    Gets together: Not on file    Attends religious service: Not on file    Active member of club or organization: Not on file    Attends meetings of clubs or  organizations: Not on file    Relationship status: Not on file  . Intimate partner violence:    Fear of current or ex partner: Not on file    Emotionally abused: Not on file    Physically abused: Not on file    Forced sexual activity: Not on file  Other Topics Concern  . Not on file  Social History Narrative  . Not on file   Past Surgical History:  Procedure Laterality Date  . ABDOMINAL HYSTERECTOMY  1989   Family History  Problem Relation Age of Onset  . Breast cancer Maternal Aunt 60   Allergies  Allergen Reactions  . Diazepam Rash  . Penicillins Itching  . Rofecoxib Swelling  . Latex Itching    When used gloves in the past  . Prozac [Fluoxetine Hcl] Itching and Rash  . Zinc Oxide [Mexsana] Rash      Assessment & Plan:  The patient presents for a monthly Unna boot follow-up.  The patient has been undergoing weekly 3 layer zinc oxide Unna wrap therapy to the bilateral lower extremity for a lymphedema exacerbation.  The patient presents today with some improvement.  The patient has purchased a pair of medical grade 1 compression socks.  The patient continues to elevate her legs heart level or higher on a daily basis multiple times a day.  The patient is trying to remain active as possible.  The patient notes some improvement in her edema.  The patient notes that her "skin is looking better".  The patient still experiences some pain with ambulation particularly to the bilateral calves.  The patient denies any rest pain or ulcer formation to the bilateral lower extremity.  The patient denies any fever, nausea vomiting.  1. Bilateral lower extremity edema - Stable The patient will now transition into wearing medical grade 1 compression socks.  She knows to place them in the a.m. and remove them in the p.m. Patient is to continue to elevate her legs heart level higher as much as possible We will continue to remain active The patient back and have her undergo bilateral lower  extremity venous duplex to rule out any contributing venous versus lymphatic disease. The patient was instructed to call the office in the interim if any worsening edema or ulcerations to the legs, feet or toes occurs. The patient expresses their understanding.  - VAS [place] LOWER EXTREMITY VENOUS REFLUX; Future  2. Pain in both lower extremities - Stable The patient does have multiple risk factors for peripheral artery disease Patient continues to experience bilateral calf claudication even though her bilateral lower extremity edema has improved Bring her back to undergo a bilateral ABI to rule out any peripheral artery disease I have discussed with the patient at  length the risk factors for and pathogenesis of atherosclerotic disease and encouraged a healthy diet, regular exercise regimen and blood pressure / glucose control.  The patient was encouraged to call the office in the interim if he experiences any claudication like symptoms, rest pain or ulcers to his feet / toes  - VAS [place] ABI WITH/WO TBI; Future  Current Outpatient Medications on File Prior to Visit  Medication Sig Dispense Refill  . aspirin EC 81 MG tablet Take by mouth.    . buprenorphine (SUBUTEX) 8 MG SUBL SL tablet   0  . cyclobenzaprine (FLEXERIL) 10 MG tablet TAKE 1 TABLET BY MOUTH TWICE DAILY AS NEEDED FOR MUSCLE SPASM    . diclofenac sodium (VOLTAREN) 1 % GEL   2  . furosemide (LASIX) 80 MG tablet Take by mouth.    . gabapentin (NEURONTIN) 300 MG capsule   3  . Liraglutide -Weight Management (SAXENDA) 18 MG/3ML SOPN     . lisinopril (PRINIVIL,ZESTRIL) 10 MG tablet Take by mouth.    . Multiple Vitamin (MULTIVITAMIN) capsule Take by mouth.    Derrill Memo ON 01/27/2018] tapentadol HCl (NUCYNTA) 75 MG tablet 1-2 po TID prn     No current facility-administered medications on file prior to visit.    There are no Patient Instructions on file for this visit. No follow-ups on file.  Chavis Tessler A Myrical Andujo, PA-C

## 2018-01-16 ENCOUNTER — Ambulatory Visit (INDEPENDENT_AMBULATORY_CARE_PROVIDER_SITE_OTHER): Payer: Medicare Other | Admitting: Vascular Surgery

## 2018-01-16 ENCOUNTER — Encounter (INDEPENDENT_AMBULATORY_CARE_PROVIDER_SITE_OTHER): Payer: Medicare Other

## 2018-12-16 ENCOUNTER — Other Ambulatory Visit: Payer: Self-pay | Admitting: Family Medicine

## 2018-12-16 DIAGNOSIS — Z1231 Encounter for screening mammogram for malignant neoplasm of breast: Secondary | ICD-10-CM

## 2018-12-16 DIAGNOSIS — Z13820 Encounter for screening for osteoporosis: Secondary | ICD-10-CM

## 2019-01-28 ENCOUNTER — Other Ambulatory Visit: Payer: Medicare Other

## 2019-04-14 ENCOUNTER — Emergency Department (HOSPITAL_COMMUNITY)
Admission: EM | Admit: 2019-04-14 | Discharge: 2019-04-14 | Discharge status: Against Medical Advice | Payer: Medicare HMO

## 2019-04-14 ENCOUNTER — Other Ambulatory Visit: Payer: Self-pay

## 2019-04-14 DIAGNOSIS — F1123 Opioid dependence with withdrawal: Secondary | ICD-10-CM | POA: Insufficient documentation

## 2019-04-14 DIAGNOSIS — R030 Elevated blood-pressure reading, without diagnosis of hypertension: Secondary | ICD-10-CM | POA: Diagnosis not present

## 2019-04-14 DIAGNOSIS — Z91040 Latex allergy status: Secondary | ICD-10-CM | POA: Diagnosis not present

## 2019-04-14 DIAGNOSIS — Z7982 Long term (current) use of aspirin: Secondary | ICD-10-CM | POA: Insufficient documentation

## 2019-04-14 DIAGNOSIS — Z791 Long term (current) use of non-steroidal anti-inflammatories (NSAID): Secondary | ICD-10-CM | POA: Insufficient documentation

## 2019-04-14 DIAGNOSIS — Z79899 Other long term (current) drug therapy: Secondary | ICD-10-CM | POA: Diagnosis not present

## 2019-04-14 NOTE — ED Notes (Signed)
Patient called several attempts patient left before triage.

## 2019-04-15 ENCOUNTER — Encounter (HOSPITAL_COMMUNITY): Payer: Self-pay

## 2019-04-15 ENCOUNTER — Emergency Department (HOSPITAL_COMMUNITY)
Admission: EM | Admit: 2019-04-15 | Discharge: 2019-04-15 | Disposition: A | Discharge status: Home / Self Care | Payer: Medicare HMO | Attending: Emergency Medicine | Admitting: Emergency Medicine

## 2019-04-15 ENCOUNTER — Other Ambulatory Visit: Payer: Self-pay

## 2019-04-15 DIAGNOSIS — R030 Elevated blood-pressure reading, without diagnosis of hypertension: Secondary | ICD-10-CM

## 2019-04-15 DIAGNOSIS — F1193 Opioid use, unspecified with withdrawal: Secondary | ICD-10-CM

## 2019-04-15 DIAGNOSIS — F1123 Opioid dependence with withdrawal: Secondary | ICD-10-CM

## 2019-04-15 MED ORDER — HYDROMORPHONE HCL 2 MG/ML IJ SOLN
2.0000 mg | Freq: Once | INTRAMUSCULAR | Status: AC
  Administered 2019-04-15: 2 mg via INTRAMUSCULAR
  Filled 2019-04-15: qty 1

## 2019-04-15 MED ORDER — ONDANSETRON HCL 4 MG PO TABS: 4.0000 mg | ORAL_TABLET | Freq: Four times a day (QID) | ORAL | 0 refills | Status: DC | PRN

## 2019-04-15 MED ORDER — ONDANSETRON 8 MG PO TBDP
8.0000 mg | ORAL_TABLET | Freq: Once | ORAL | Status: AC
  Administered 2019-04-15: 8 mg via ORAL
  Filled 2019-04-15: qty 1

## 2019-04-15 MED ORDER — CLONIDINE HCL 0.1 MG PO TABS: ORAL_TABLET | ORAL | 0 refills | Status: DC

## 2019-04-15 MED ORDER — CLONIDINE HCL 0.2 MG PO TABS
0.2000 mg | ORAL_TABLET | Freq: Once | ORAL | Status: AC
  Administered 2019-04-15: 0.2 mg via ORAL
  Filled 2019-04-15: qty 1

## 2019-04-15 NOTE — ED Provider Notes (Signed)
[facility] EMERGENCY DEPARTMENT Provider Note   CSN: [id number] Arrival date & time: 04/14/19  2243   History Chief Complaint  Patient presents with  . Abdominal Pain    "withdrawel from opiate" per pt    Morgan Dixon is a 68 y.o. female.  The history is provided by the patient.  Abdominal Pain She has chronic back pain for which she takes oxycodone 10 mg as often as 6 times a day.  She had surgery on her right wrist, and had increased pain in the postop period and took more oxycodone than she is supposed to take.  She ran out of oxycodone 3 days ago, and is to see her pain management doctor tomorrow.  She is complaining of generalized abdominal pain which she thinks may be from opioid withdrawal.  There is also some nausea and she has had loose stools but not overt diarrhea.  She denies fever, chills, sweats.  Past Medical History:  Diagnosis Date  . Chronic pain   . Edema extremities     Patient Active Problem List   Diagnosis Date Noted  . Bilateral lower extremity edema 12/12/2017  . Pain in both lower extremities 12/12/2017    Past Surgical History:  Procedure Laterality Date  . ABDOMINAL HYSTERECTOMY  1989  . BACK SURGERY       OB History   No obstetric history on file.     Family History  Problem Relation Age of Onset  . Breast cancer Maternal Aunt 79    Social History   Tobacco Use  . Smoking status: Never Smoker  . Smokeless tobacco: Never Used  Substance Use Topics  . Alcohol use: Never  . Drug use: Never    Home Medications Prior to Admission medications   Medication Sig Start Date End Date Taking? Authorizing Provider  aspirin EC 81 MG tablet Take by mouth.    [provider]  buprenorphine (SUBUTEX) 8 MG SUBL SL tablet  11/29/17   [provider]  cyclobenzaprine (FLEXERIL) 10 MG tablet TAKE 1 TABLET BY MOUTH TWICE DAILY AS NEEDED FOR MUSCLE SPASM 11/30/17   [provider]  diclofenac sodium (VOLTAREN) 1 %  GEL  12/09/17   [provider]  furosemide (LASIX) 80 MG tablet Take by mouth.    [provider]  gabapentin (NEURONTIN) 300 MG capsule  11/29/17   [provider]  Liraglutide -Weight Management ([place]) 18 MG/3ML SOPN  11/19/17   [provider]  lisinopril (PRINIVIL,ZESTRIL) 10 MG tablet Take by mouth.    [provider]  Multiple Vitamin (MULTIVITAMIN) capsule Take by mouth.    [provider]  tapentadol HCl (NUCYNTA) 75 MG tablet 1-2 po TID prn 01/27/18   [provider]    Allergies    Diazepam, Penicillins, Rofecoxib, Latex, Prozac [fluoxetine hcl], and Zinc oxide [mexsana]  Review of Systems   Review of Systems  Gastrointestinal: Positive for abdominal pain.  All other systems reviewed and are negative.   Physical Exam Updated Vital Signs BP (!) 173/67 (BP Location: Right Arm)   Pulse 65   Temp 98.1 F (36.7 C) (Oral)   Resp 17   Ht 5\' 6"  (1.676 m)   Wt 109.8 kg   SpO2 99%   BMI 39.06 kg/m   Physical Exam Vitals and nursing note reviewed.   68 year old female, resting comfortably and in no acute distress. Vital signs are significant for elevated blood pressure. Oxygen saturation is 99%, which is  normal. Head is normocephalic and atraumatic. PERRLA, EOMI. Oropharynx is clear. Neck is nontender and supple without adenopathy or JVD. Back is nontender and there is no CVA tenderness. Lungs are clear without rales, wheezes, or rhonchi. Chest is nontender. Heart has regular rate and rhythm without murmur. Abdomen is soft, flat, with mild tenderness rather diffusely.  There are no masses or hepatosplenomegaly and peristalsis is normoactive. Extremities have 1+ edema, full range of motion is present. Skin is warm and dry without rash. Neurologic: Mental status is normal, cranial nerves are intact, there are no motor or sensory deficits.  ED Results / Procedures / Treatments    Procedures Procedures    Medications Ordered in ED Medications  cloNIDine (CATAPRES) tablet 0.2 mg (has no administration in time range)  ondansetron (ZOFRAN-ODT) disintegrating tablet 8 mg (8 mg Oral Given 04/15/19 0332)  HYDROmorphone (DILAUDID) injection 2 mg (2 mg Intramuscular Given 04/15/19 [id number])    ED Course  I have reviewed the triage vital signs and the nursing notes.  MDM Rules/Calculators/A&P Opioid withdrawal syndrome.  Old records are reviewed confirming recent wrist surgery, chronic pain management.  I have explained to the patient that I cannot prescribe her opioids.  She is given an injection of hydromorphone and is sent home with prescriptions for clonidine and ondansetron.  Recommended that she follow-up with her pain management doctor as soon as possible to see if she can get a new prescription for oxycodone.  Final Clinical Impression(s) / ED Diagnoses Final diagnoses:  Opioid withdrawal ([place])  Elevated blood pressure reading without diagnosis of hypertension    Rx / DC Orders ED Discharge Orders         Ordered    ondansetron (ZOFRAN) 4 MG tablet  Every 6 hours PRN     04/15/19 0326    cloNIDine (CATAPRES) 0.1 MG tablet     04/15/19 [id number]           Delora Fuel, [place] 123456 [phone]

## 2019-04-15 NOTE — ED Triage Notes (Signed)
PT ran out of OxyContin (10 mg 6 times a day) 3 days ago. Pt reports going to pain clinic in [place] (Dr Wyn Quaker). Pt has appt Thursday afternoon with pain clinic, but is requesting medication to "get her through" until then. PT says her abdominal pain started this morning and "thinks it may be from withdrawals". PT had surgery on wrist in October 3, and says that is why she is out of pain meds early.

## 2019-04-15 NOTE — Discharge Instructions (Signed)
Please contact your pain management clinic to see if they can give you your narcotic prescription a little early.

## 2019-04-27 ENCOUNTER — Ambulatory Visit: Payer: Medicare HMO | Admitting: Gastroenterology

## 2019-04-29 ENCOUNTER — Ambulatory Visit
Admission: RE | Admit: 2019-04-29 | Discharge: 2019-04-29 | Disposition: A | Discharge status: Home / Self Care | Payer: Medicare HMO | Source: Ambulatory Visit | Attending: Family Medicine | Admitting: Family Medicine

## 2019-04-29 ENCOUNTER — Other Ambulatory Visit: Payer: Self-pay

## 2019-04-29 DIAGNOSIS — Z780 Asymptomatic menopausal state: Secondary | ICD-10-CM | POA: Insufficient documentation

## 2019-04-29 DIAGNOSIS — Z1231 Encounter for screening mammogram for malignant neoplasm of breast: Secondary | ICD-10-CM | POA: Diagnosis present

## 2019-04-29 DIAGNOSIS — Z13820 Encounter for screening for osteoporosis: Secondary | ICD-10-CM | POA: Diagnosis present

## 2019-06-08 ENCOUNTER — Ambulatory Visit: Payer: Medicare HMO | Admitting: Gastroenterology

## 2019-06-08 ENCOUNTER — Encounter: Payer: Self-pay | Admitting: *Deleted

## 2019-06-08 DIAGNOSIS — K625 Hemorrhage of anus and rectum: Secondary | ICD-10-CM

## 2019-06-14 DIAGNOSIS — Z9884 Bariatric surgery status: Secondary | ICD-10-CM | POA: Insufficient documentation

## 2019-08-03 ENCOUNTER — Ambulatory Visit: Payer: Medicare HMO | Admitting: Dermatology

## 2020-01-04 ENCOUNTER — Other Ambulatory Visit: Payer: Self-pay

## 2020-01-04 ENCOUNTER — Ambulatory Visit: Payer: Medicare HMO | Admitting: Dermatology

## 2020-01-04 DIAGNOSIS — L578 Other skin changes due to chronic exposure to nonionizing radiation: Secondary | ICD-10-CM | POA: Diagnosis not present

## 2020-01-04 DIAGNOSIS — Z1283 Encounter for screening for malignant neoplasm of skin: Secondary | ICD-10-CM

## 2020-01-04 DIAGNOSIS — L820 Inflamed seborrheic keratosis: Secondary | ICD-10-CM | POA: Diagnosis not present

## 2020-01-04 DIAGNOSIS — L570 Actinic keratosis: Secondary | ICD-10-CM

## 2020-01-04 DIAGNOSIS — L821 Other seborrheic keratosis: Secondary | ICD-10-CM

## 2020-01-04 NOTE — Progress Notes (Signed)
   New Patient Visit  Subjective  Morgan Dixon is a 69 y.o. female who presents for the following: Skin Problem (spots on the face and R chest growing and irritating ). The patient presents for Total-Body Skin Exam (TBSE) for skin cancer screening and mole check.  The following portions of the chart were reviewed this encounter and updated as appropriate:  Tobacco  Allergies  Meds  Problems  Med Hx  Surg Hx  Fam Hx     Review of Systems:  No other skin or systemic complaints except as noted in HPI or Assessment and Plan.  Objective  Well appearing patient in no apparent distress; mood and affect are within normal limits.  A full examination was performed including scalp, head, eyes, ears, nose, lips, neck, chest, axillae, abdomen, back, buttocks, bilateral upper extremities, bilateral lower extremities, hands, feet, fingers, toes, fingernails, and toenails. All findings within normal limits unless otherwise noted below.  Objective  Right chest: Erythematous keratotic or waxy stuck-on papule or plaque.   Objective  forehead (17): Erythematous thin papules/macules with gritty scale.    Assessment & Plan  Inflamed seborrheic keratosis Right chest  Destruction of lesion - Right chest Complexity: simple   Destruction method: cryotherapy   Informed consent: discussed and consent obtained   Timeout:  patient name, date of birth, surgical site, and procedure verified Lesion destroyed using liquid nitrogen: Yes   Region frozen until ice ball extended beyond lesion: Yes   Outcome: patient tolerated procedure well with no complications   Post-procedure details: wound care instructions given    AK (actinic keratosis) (17) forehead  Destruction of lesion - forehead Complexity: simple   Destruction method: cryotherapy   Informed consent: discussed and consent obtained   Timeout:  patient name, date of birth, surgical site, and procedure verified Lesion destroyed using  liquid nitrogen: Yes   Region frozen until ice ball extended beyond lesion: Yes   Outcome: patient tolerated procedure well with no complications   Post-procedure details: wound care instructions given    Actinic Damage - diffuse scaly erythematous macules with underlying dyspigmentation - Recommend daily broad spectrum sunscreen SPF 30+ to sun-exposed areas, reapply every 2 hours as needed.  - Call for new or changing lesions.  Seborrheic Keratoses - Stuck-on, waxy, tan-brown papules and plaques  - Discussed benign etiology and prognosis. - Observe - Call for any changes  Return in about 2 months (around 03/05/2020) for Aks.  IMarye Round, CMA, am acting as scribe for Sarina Ser, MD .  Documentation: I have reviewed the above documentation for accuracy and completeness, and I agree with the above.  Sarina Ser, MD

## 2020-01-04 NOTE — Patient Instructions (Signed)
Cryotherapy Aftercare  . Wash gently with soap and water everyday.   . Apply Vaseline and Band-Aid daily until healed. Recommend daily broad spectrum sunscreen SPF 30+ to sun-exposed areas, reapply every 2 hours as needed. Call for new or changing lesions.  

## 2020-01-05 ENCOUNTER — Encounter: Payer: Self-pay | Admitting: Dermatology

## 2020-03-03 ENCOUNTER — Other Ambulatory Visit: Payer: Self-pay

## 2020-03-03 ENCOUNTER — Ambulatory Visit: Payer: Medicare HMO | Admitting: Dermatology

## 2020-03-03 DIAGNOSIS — L570 Actinic keratosis: Secondary | ICD-10-CM | POA: Diagnosis not present

## 2020-03-03 DIAGNOSIS — L578 Other skin changes due to chronic exposure to nonionizing radiation: Secondary | ICD-10-CM

## 2020-03-03 NOTE — Progress Notes (Signed)
   Follow-Up Visit   Subjective  Morgan Dixon is a 69 y.o. female who presents for the following: Actinic Keratosis (Pt here for 2 months f/u on precancers on her face, she c/o pink scaly patches ).  The following portions of the chart were reviewed this encounter and updated as appropriate:  Tobacco  Allergies  Meds  Problems  Med Hx  Surg Hx  Fam Hx     Review of Systems:  No other skin or systemic complaints except as noted in HPI or Assessment and Plan.  Objective  Well appearing patient in no apparent distress; mood and affect are within normal limits.  A focused examination was performed including face. Relevant physical exam findings are noted in the Assessment and Plan.  Objective  Head - Anterior (Face) (4): Erythematous thin papules/macules with gritty scale.    Assessment & Plan  AK (actinic keratosis) (4) Head - Anterior (Face)  Destruction of lesion - Head - Anterior (Face) Complexity: simple   Destruction method: cryotherapy   Informed consent: discussed and consent obtained   Timeout:  patient name, date of birth, surgical site, and procedure verified Lesion destroyed using liquid nitrogen: Yes   Region frozen until ice ball extended beyond lesion: Yes   Outcome: patient tolerated procedure well with no complications   Post-procedure details: wound care instructions given     Actinic Damage - chronic, secondary to cumulative UV radiation exposure/sun exposure over time - diffuse scaly erythematous macules with underlying dyspigmentation - Recommend daily broad spectrum sunscreen SPF 30+ to sun-exposed areas, reapply every 2 hours as needed.  - Call for new or changing lesions.  - Discussed "Field Treatment" for Severe, Confluent Actinic Changes with Pre-Cancerous Actinic Keratoses due to cumulative sun exposure/UV radiation exposure over time Field treatment involves treatment of an entire area of skin that has confluent Actinic Changes (Sun/  Ultraviolet light damage) and PreCancerous Actinic Keratoses by method of PhotoDynamic Therapy (PDT) and/or prescription Topical Chemotherapy agents such as 5-fluorouracil, 5-fluorouracil/calcipotriene, and/or imiquimod.  The purpose is to decrease the number of clinically evident and subclinical PreCancerous lesions to prevent progression to development of skin cancer by chemically destroying early precancer changes that may or may not be visible.  It has been shown to reduce the risk of developing skin cancer in the treated area. As a result of treatment, redness, scaling, crusting, and open sores may occur during treatment course. One or more than one of these methods may be used and may have to be used several times to control, suppress and eliminate the PreCancerous changes. Discussed treatment course, expected reaction, and possible side effects.  Return in about 6 months (around 08/31/2020).  IMarye Round, CMA, am acting as scribe for Sarina Ser, MD .  Documentation: I have reviewed the above documentation for accuracy and completeness, and I agree with the above.  Sarina Ser, MD

## 2020-03-03 NOTE — Patient Instructions (Signed)
Cryotherapy Aftercare  . Wash gently with soap and water everyday.   . Apply Vaseline and Band-Aid daily until healed.  

## 2020-03-15 ENCOUNTER — Encounter: Payer: Self-pay | Admitting: Dermatology

## 2020-03-28 ENCOUNTER — Other Ambulatory Visit: Payer: Self-pay | Admitting: Family Medicine

## 2020-03-28 DIAGNOSIS — Z1231 Encounter for screening mammogram for malignant neoplasm of breast: Secondary | ICD-10-CM

## 2020-05-24 ENCOUNTER — Other Ambulatory Visit: Payer: Self-pay

## 2020-05-24 ENCOUNTER — Emergency Department
Admission: EM | Admit: 2020-05-24 | Discharge: 2020-05-25 | Disposition: A | Discharge status: Other Acute Inpatient Hospital | Payer: Medicare HMO | Attending: Emergency Medicine | Admitting: Emergency Medicine

## 2020-05-24 ENCOUNTER — Emergency Department: Payer: Medicare HMO

## 2020-05-24 DIAGNOSIS — Z91040 Latex allergy status: Secondary | ICD-10-CM | POA: Diagnosis not present

## 2020-05-24 DIAGNOSIS — Z7982 Long term (current) use of aspirin: Secondary | ICD-10-CM | POA: Insufficient documentation

## 2020-05-24 DIAGNOSIS — Z9884 Bariatric surgery status: Secondary | ICD-10-CM

## 2020-05-24 DIAGNOSIS — Z980 Intestinal bypass and anastomosis status: Secondary | ICD-10-CM | POA: Insufficient documentation

## 2020-05-24 DIAGNOSIS — Z79899 Other long term (current) drug therapy: Secondary | ICD-10-CM | POA: Diagnosis not present

## 2020-05-24 DIAGNOSIS — R1013 Epigastric pain: Secondary | ICD-10-CM | POA: Insufficient documentation

## 2020-05-24 DIAGNOSIS — Z20822 Contact with and (suspected) exposure to covid-19: Secondary | ICD-10-CM | POA: Insufficient documentation

## 2020-05-24 LAB — COMPREHENSIVE METABOLIC PANEL
ALT: 14 U/L (ref 0–44)
AST: 24 U/L (ref 15–41)
Albumin: 3.8 g/dL (ref 3.5–5.0)
Alkaline Phosphatase: 62 U/L (ref 38–126)
Anion gap: 12 (ref 5–15)
BUN: 30 mg/dL — ABNORMAL HIGH (ref 8–23)
CO2: 26 mmol/L (ref 22–32)
Calcium: 9.3 mg/dL (ref 8.9–10.3)
Chloride: 103 mmol/L (ref 98–111)
Creatinine, Ser: 0.79 mg/dL (ref 0.44–1.00)
GFR, Estimated: >60 mL/min (ref >60)
Glucose, Bld: 149 mg/dL — ABNORMAL HIGH (ref 70–99)
Potassium: 4 mmol/L (ref 3.5–5.1)
Sodium: 141 mmol/L (ref 135–145)
Total Bilirubin: 1.5 mg/dL — ABNORMAL HIGH (ref 0.3–1.2)
Total Protein: 7.4 g/dL (ref 6.5–8.1)

## 2020-05-24 LAB — URINALYSIS, COMPLETE (UACMP) WITH MICROSCOPIC
Bacteria, UA: NONE SEEN
Bilirubin Urine: NEGATIVE
Glucose, UA: NEGATIVE mg/dL
Hgb urine dipstick: NEGATIVE
Ketones, ur: 20 mg/dL — AB
Leukocytes,Ua: NEGATIVE
Nitrite: NEGATIVE
Protein, ur: NEGATIVE mg/dL
Specific Gravity, Urine: 1.032 — ABNORMAL HIGH (ref 1.005–1.030)
pH: 5 (ref 5.0–8.0)

## 2020-05-24 LAB — RESP PANEL BY RT-PCR (FLU A&B, COVID) ARPGX2
Influenza A by PCR: NEGATIVE
Influenza B by PCR: NEGATIVE
SARS Coronavirus 2 by RT PCR: NEGATIVE

## 2020-05-24 LAB — URINE DRUG SCREEN, QUALITATIVE (ARMC ONLY)
Amphetamines, Ur Screen: NOT DETECTED
Barbiturates, Ur Screen: NOT DETECTED
Benzodiazepine, Ur Scrn: NOT DETECTED
Cannabinoid 50 Ng, Ur ~~LOC~~: NOT DETECTED
Cocaine Metabolite,Ur ~~LOC~~: NOT DETECTED
MDMA (Ecstasy)Ur Screen: NOT DETECTED
Methadone Scn, Ur: NOT DETECTED
Opiate, Ur Screen: NOT DETECTED
Phencyclidine (PCP) Ur S: NOT DETECTED
Tricyclic, Ur Screen: NOT DETECTED

## 2020-05-24 LAB — CBC WITH DIFFERENTIAL/PLATELET
Abs Immature Granulocytes: 0.02 10*3/uL (ref 0.00–0.07)
Basophils Absolute: 0 10*3/uL (ref 0.0–0.1)
Basophils Relative: 0 %
Eosinophils Absolute: 0 10*3/uL (ref 0.0–0.5)
Eosinophils Relative: 0 %
HCT: 38.4 % (ref 36.0–46.0)
Hemoglobin: 12.6 g/dL (ref 12.0–15.0)
Immature Granulocytes: 0 %
Lymphocytes Relative: 15 %
Lymphs Abs: 1.1 10*3/uL (ref 0.7–4.0)
MCH: 30.7 pg (ref 26.0–34.0)
MCHC: 32.8 g/dL (ref 30.0–36.0)
MCV: 93.4 fL (ref 80.0–100.0)
Monocytes Absolute: 0.2 10*3/uL (ref 0.1–1.0)
Monocytes Relative: 3 %
Neutro Abs: 6 10*3/uL (ref 1.7–7.7)
Neutrophils Relative %: 82 %
Platelets: 214 10*3/uL (ref 150–400)
RBC: 4.11 MIL/uL (ref 3.87–5.11)
RDW: 12.6 % (ref 11.5–15.5)
WBC: 7.4 10*3/uL (ref 4.0–10.5)
nRBC: 0 % (ref 0.0–0.2)

## 2020-05-24 LAB — LIPASE, BLOOD: Lipase: 194 U/L — ABNORMAL HIGH (ref 11–51)

## 2020-05-24 LAB — POC SARS CORONAVIRUS 2 AG -  ED: SARS Coronavirus 2 Ag: NEGATIVE

## 2020-05-24 LAB — ETHANOL: Alcohol, Ethyl (B): <10 mg/dL (ref <10)

## 2020-05-24 MED ORDER — ONDANSETRON HCL 4 MG/2ML IJ SOLN
4.0000 mg | Freq: Once | INTRAMUSCULAR | Status: AC
  Administered 2020-05-24: 4 mg via INTRAVENOUS
  Filled 2020-05-24: qty 2

## 2020-05-24 MED ORDER — HYDROMORPHONE HCL 1 MG/ML IJ SOLN
1.0000 mg | INTRAMUSCULAR | Status: DC | PRN
  Administered 2020-05-24 – 2020-05-25 (×5): 1 mg via INTRAVENOUS
  Filled 2020-05-24 (×5): qty 1

## 2020-05-24 MED ORDER — HYDROMORPHONE HCL 1 MG/ML IJ SOLN
1.0000 mg | INTRAMUSCULAR | Status: AC
  Administered 2020-05-24: 1 mg via INTRAVENOUS
  Filled 2020-05-24: qty 1

## 2020-05-24 MED ORDER — KETAMINE HCL 10 MG/ML IJ SOLN
0.3000 mg/kg | Freq: Once | INTRAMUSCULAR | Status: AC
  Administered 2020-05-24: 33 mg via INTRAVENOUS
  Filled 2020-05-24: qty 1

## 2020-05-24 MED ORDER — ONDANSETRON HCL 4 MG/2ML IJ SOLN: 4.0000 mg | INTRAMUSCULAR | Status: DC | PRN

## 2020-05-24 MED ORDER — HALOPERIDOL LACTATE 5 MG/ML IJ SOLN
2.5000 mg | Freq: Once | INTRAMUSCULAR | Status: AC
  Administered 2020-05-24: 2.5 mg via INTRAVENOUS
  Filled 2020-05-24: qty 1

## 2020-05-24 MED ORDER — PANTOPRAZOLE SODIUM 40 MG IV SOLR
40.0000 mg | Freq: Once | INTRAVENOUS | Status: AC
  Administered 2020-05-24: 40 mg via INTRAVENOUS
  Filled 2020-05-24: qty 40

## 2020-05-24 MED ORDER — HALOPERIDOL LACTATE 5 MG/ML IJ SOLN
2.5000 mg | Freq: Once | INTRAMUSCULAR | Status: AC
  Administered 2020-05-25: 2.5 mg via INTRAVENOUS
  Filled 2020-05-24: qty 1

## 2020-05-24 MED ORDER — HYDROMORPHONE HCL 1 MG/ML IJ SOLN
1.0000 mg | Freq: Once | INTRAMUSCULAR | Status: AC
  Administered 2020-05-24: 1 mg via INTRAVENOUS
  Filled 2020-05-24: qty 1

## 2020-05-24 MED ORDER — SODIUM CHLORIDE 0.9 % IV BOLUS
1000.0000 mL | Freq: Once | INTRAVENOUS | Status: AC
  Administered 2020-05-24: 1000 mL via INTRAVENOUS

## 2020-05-24 MED ORDER — PANTOPRAZOLE SODIUM 40 MG IV SOLR: 40.0000 mg | Freq: Two times a day (BID) | INTRAVENOUS | Status: DC

## 2020-05-24 MED ORDER — DEXTROSE IN LACTATED RINGERS 5 % IV SOLN: INTRAVENOUS | Status: DC

## 2020-05-24 MED ORDER — IOHEXOL 300 MG/ML  SOLN
100.0000 mL | Freq: Once | INTRAMUSCULAR | Status: AC | PRN
  Administered 2020-05-24: 100 mL via INTRAVENOUS

## 2020-05-24 NOTE — ED Provider Notes (Signed)
[facility] Emergency Department Provider Note  ____________________________________________  Time seen: Approximately 6:16 PM  I have reviewed the triage vital signs and the nursing notes.   HISTORY  Chief Complaint Abdominal Pain    HPI Morgan Dixon is a 70 y.o. female with a past history of chronic pain, lymphedema, daily Suboxone use who comes to the ED by EMS due to epigastric pain that started this morning.  Constant, worsening, gradual onset, severe and 10/10 in intensity, radiating to bilateral flanks.  Associated with nausea and vomiting.  No constipation or diarrhea.  Prior to today she was in her usual state of health.  Normal bowel movements, no fevers.  EMS also report that the patient's husband died about 2 weeks ago.  They noted multiple liquor bottles in the home.   Past Medical History:  Diagnosis Date  . Chronic pain   . Edema extremities      Patient Active Problem List   Diagnosis Date Noted  . Bilateral lower extremity edema 12/12/2017  . Pain in both lower extremities 12/12/2017     Past Surgical History:  Procedure Laterality Date  . ABDOMINAL HYSTERECTOMY  1989  . BACK SURGERY       Prior to Admission medications   Medication Sig Start Date End Date Taking? Authorizing Provider  aspirin EC 81 MG tablet Take by mouth.    [provider]  buprenorphine (SUBUTEX) 8 MG SUBL SL tablet  11/29/17   [provider]  cloNIDine (CATAPRES) 0.1 MG tablet 1 tab po tid x 2 days, then bid x 2 days, then once daily x 2 days 44/81/85   Delora Fuel, MD  cyclobenzaprine (FLEXERIL) 10 MG tablet TAKE 1 TABLET BY MOUTH TWICE DAILY AS NEEDED FOR MUSCLE SPASM 11/30/17   [provider]  diclofenac sodium (VOLTAREN) 1 % GEL  12/09/17   [provider]  furosemide (LASIX) 80 MG tablet Take by mouth.    [provider]  gabapentin (NEURONTIN) 300 MG capsule  11/29/17   [provider]   Liraglutide -Weight Management ([place]) 18 MG/3ML SOPN  11/19/17   [provider]  lisinopril (PRINIVIL,ZESTRIL) 10 MG tablet Take by mouth.    [provider]  Multiple Vitamin (MULTIVITAMIN) capsule Take by mouth.    [provider]  ondansetron (ZOFRAN) 4 MG tablet Take 1 tablet (4 mg total) by mouth every 6 (six) hours as needed. 63/14/97   Delora Fuel, MD  tapentadol HCl (NUCYNTA) 75 MG tablet 1-2 po TID prn 01/27/18   [provider]     Allergies Diazepam, Penicillins, Rofecoxib, Latex, Prozac [fluoxetine hcl], and Zinc oxide [mexsana]   Family History  Problem Relation Age of Onset  . Breast cancer Maternal Aunt 60    Social History Social History   Tobacco Use  . Smoking status: Never Smoker  . Smokeless tobacco: Never Used  Vaping Use  . Vaping Use: Never used  Substance Use Topics  . Alcohol use: Never  . Drug use: Never    Review of Systems  Constitutional:   No fever or chills.  ENT:   No sore throat. No rhinorrhea. Cardiovascular:   No chest pain or syncope. Respiratory:   No dyspnea or cough. Gastrointestinal:   Positive as above for abdominal pain and vomiting Musculoskeletal:   Negative for focal pain or swelling All other systems reviewed and are negative except as documented above in ROS and HPI.  ____________________________________________   PHYSICAL EXAM:  VITAL SIGNS: ED Triage Vitals  Enc Vitals Group     BP 05/24/20 1712 101/75     Pulse Rate 05/24/20 1712 60     Resp 05/24/20 1712 20     Temp 05/24/20 1712 98.2 F (36.8 C)     Temp Source 05/24/20 1712 Oral     SpO2 05/24/20 1712 100 %     Weight 05/24/20 1709 242 lb 8.1 oz (110 kg)     Height 05/24/20 1709 5\' 6"  (1.676 m)     Head Circumference --      Peak Flow --      Pain Score 05/24/20 1707 10     Pain Loc --      Pain Edu? --      Excl. in [place]? --     Vital signs reviewed, nursing assessments reviewed.   Constitutional:   Alert  and oriented. Non-toxic appearance. Eyes:   Conjunctivae are normal. EOMI. PERRL. ENT      Head:   Normocephalic and atraumatic.      Nose:   Normal      Mouth/Throat:   Normal.      Neck:   No meningismus. Full ROM. Hematological/Lymphatic/Immunilogical:   No cervical lymphadenopathy. Cardiovascular:   RRR. Symmetric bilateral radial and DP pulses.  No murmurs. Cap refill less than 2 seconds. Respiratory:   Normal respiratory effort without tachypnea/retractions. Breath sounds are clear and equal bilaterally. No wheezes/rales/rhonchi. Gastrointestinal:   Soft with epigastric tenderness.  Nondistended.  No CVA tenderness, no rebound rigidity or guarding.   Musculoskeletal:   Normal range of motion in all extremities. No joint effusions.  No lower extremity tenderness.  Chronic lymphedema Neurologic:   Normal speech and language.  Motor grossly intact. No acute focal neurologic deficits are appreciated.  Skin:    Skin is warm, dry and intact. No rash noted.  No petechiae, purpura, or bullae.  ____________________________________________    LABS (pertinent positives/negatives) (all labs ordered are listed, but only abnormal results are displayed) Labs Reviewed  COMPREHENSIVE METABOLIC PANEL - Abnormal; Notable for the following components:      Result Value   Glucose, Bld 149 (*)    BUN 30 (*)    Total Bilirubin 1.5 (*)    All other components within normal limits  LIPASE, BLOOD - Abnormal; Notable for the following components:   Lipase 194 (*)    All other components within normal limits  URINALYSIS, COMPLETE (UACMP) WITH MICROSCOPIC - Abnormal; Notable for the following components:   Color, Urine YELLOW (*)    APPearance CLEAR (*)    Specific Gravity, Urine 1.032 (*)    Ketones, ur 20 (*)    All other components within normal limits  RESP PANEL BY RT-PCR (FLU A&B, COVID) ARPGX2  CBC WITH DIFFERENTIAL/PLATELET  ETHANOL  URINE DRUG SCREEN, QUALITATIVE ([facility] ONLY)  POC SARS  CORONAVIRUS 2 AG -  ED   ____________________________________________   EKG  Prehospital EKG performed by EMS interpreted by me  Date: 05/24/2020  Rate: 65  Rhythm: normal sinus rhythm  QRS Axis: normal  Intervals: normal  ST/T Wave abnormalities: normal  Conduction Disutrbances: none  Narrative Interpretation: unremarkable      ____________________________________________    RADIOLOGY  CT ABDOMEN PELVIS W CONTRAST  Result Date: 05/24/2020 CLINICAL DATA:  Epigastric pain radiating to flank history of gastric bypass EXAM: CT ABDOMEN AND PELVIS WITH CONTRAST TECHNIQUE: Multidetector CT imaging of the abdomen and pelvis was performed using the standard protocol  following bolus administration of intravenous contrast. CONTRAST:  182mL OMNIPAQUE IOHEXOL 300 MG/ML  SOLN COMPARISON:  None. FINDINGS: Lower chest: The visualized heart size within normal limits. No pericardial fluid/thickening. No hiatal hernia. The visualized portions of the lungs are clear. Hepatobiliary: The liver is normal in density without focal abnormality.The main portal vein is patent. The patient is status post cholecystectomy. Mild periportal edema is noted. Pancreas: Unremarkable. No pancreatic ductal dilatation or surrounding inflammatory changes. Spleen: Normal in size without focal abnormality. Adrenals/Urinary Tract: Both adrenal glands appear normal. The kidneys and collecting system appear normal without evidence of urinary tract calculus or hydronephrosis. Bladder is unremarkable. Stomach/Bowel: The patient is status post gastric bypass. The gastric pouch is displaced within the left upper quadrant with adjacent sutures and anastomosed to the jejunum which is also displacing the left upper quadrant. Within the remnant stomach there is a heterogeneous hyperdense masslike expansion of the stomach diffusely with diffuse wall thickening and edema with surrounding mesenteric edema which extends to the gastroduodenal  junction. There is a mildly dilated fluid-filled duodenum seen. The remainder of the small bowel is unremarkable. Scattered colonic diverticula are noted without diverticulitis. Vascular/Lymphatic: Scattered small aortocaval and para-aortic lymph nodes are noted. There is also small lymph nodes seen within the lesser curvature of the stomach. No acute vascular abnormality noted. Reproductive: The patient is status post hysterectomy. No adnexal masses or collections seen. Other: A small fat containing right anterior upper abdominal wall hernia is noted. There is also a lower right anterior abdominal wall hernia containing fat and a small portion of small bowel. Musculoskeletal: No acute or significant osseous findings. IMPRESSION: Masslike dilation of the remnant stomach with diffuse wall thickening and edema to the level of the gastroduodenal junction. There is surrounding fat stranding changes and scattered small lymph nodes. This could be due to extensive gastritis or underlying gastric neoplasm. Diverticulosis without diverticulitis. Electronically Signed   By: Prudencio Pair M.D.   On: 05/24/2020 19:11    ____________________________________________   PROCEDURES Procedures  ____________________________________________  DIFFERENTIAL DIAGNOSIS   Pancreatitis, gastritis, bowel obstruction, diverticulitis, opiate withdrawal  CLINICAL IMPRESSION / ASSESSMENT AND PLAN / ED COURSE  Medications ordered in the ED: Medications  dextrose 5 % in lactated ringers infusion (has no administration in time range)  pantoprazole (PROTONIX) injection 40 mg (has no administration in time range)  sodium chloride 0.9 % bolus 1,000 mL (0 mLs Intravenous Stopped 05/24/20 1818)  HYDROmorphone (DILAUDID) injection 1 mg (1 mg Intravenous Given 05/24/20 1716)  ondansetron (ZOFRAN) injection 4 mg (4 mg Intravenous Given 05/24/20 1715)  pantoprazole (PROTONIX) injection 40 mg (40 mg Intravenous Given 05/24/20 1716)  ketamine  (KETALAR) injection 33 mg (33 mg Intravenous Given 05/24/20 1759)  iohexol (OMNIPAQUE) 300 MG/ML solution 100 mL (100 mLs Intravenous Contrast Given 05/24/20 1836)  haloperidol lactate (HALDOL) injection 2.5 mg (2.5 mg Intravenous Given 05/24/20 1818)  HYDROmorphone (DILAUDID) injection 1 mg (1 mg Intravenous Given 05/24/20 1933)  HYDROmorphone (DILAUDID) injection 1 mg (1 mg Intravenous Given 05/24/20 2104)    Pertinent labs & imaging results that were available during my care of the patient were reviewed by me and considered in my medical decision making (see chart for details).  Morgan Dixon was evaluated in Emergency Department on 05/24/2020 for the symptoms described in the history of present illness. She was evaluated in the context of the global COVID-19 pandemic, which necessitated consideration that the patient might be at risk for infection with the SARS-CoV-2 virus that  causes COVID-19. Institutional protocols and algorithms that pertain to the evaluation of patients at risk for COVID-19 are in a state of rapid change based on information released by regulatory bodies including the CDC and federal and state organizations. These policies and algorithms were followed during the patient's care in the ED.   Patient presents with severe epigastric pain with tenderness.  This is different from her chronic pain syndrome.  Vital signs are unremarkable.  Will give IV Dilaudid 1 mg, Zofran IV, Protonix IV, obtain labs and CT scan.  ----------------------------------------- 6:22 PM on 05/24/2020 -----------------------------------------  Patient had persistent severe pain and vomiting, so Haldol and ketamine were ordered for nausea and pain relief.  ----------------------------------------- 11:13 PM on 05/24/2020 -----------------------------------------  CT scan shows normal gastric pouch connecting esophagus and jejunal anastomosis.  The gastric remnant appears edematous, inflamed, possibly  obstructed.  No signs of ischemia or perforation.  Presentation and findings discussed with GI Dr. Allen Norris who advises that since this area is not accessible by endoscope or NG tube, she will need bariatric surgery for management.  Patient was admitted to [facility] bariatric surgery in early March 2021 for similar presentation, [facility] was contacted and Dr. Geroge Baseman of bariatric surgery has accepted the patient       ____________________________________________   FINAL CLINICAL IMPRESSION(S) / ED DIAGNOSES    Final diagnoses:  History of Roux-en-Y gastric bypass  Intractable epigastric abdominal pain     ED Discharge Orders    None      Portions of this note were generated with dragon dictation software. Dictation errors may occur despite best attempts at proofreading.   Carrie Mew, MD 05/24/20 2314

## 2020-05-24 NOTE — ED Notes (Signed)
Patient yelling and crying out in pain post receiving dilaudid, patient reports she took suboxone earlier today, patient informed pain will be harder to control. MD notified. Patient to be given ketamine.

## 2020-05-24 NOTE — ED Triage Notes (Signed)
Pt to ED via [facility], c/o 78/67 pain above umbilicus. Radiating to flanks bilaterally Similar abdominal pain 1 year ago but was not in flanks No hx renal stones, denies burning or pain with urination NSR with rare unifocal PVCs, unremarkable 12 lead with EMS Pronounced bilateral lower extremity edema, at baseline for her Does have vomiting, started today. Normal BMs Allergic to valium CBG 135, vomiting clear fluid.

## 2020-05-24 NOTE — ED Notes (Signed)
Arnell CT to push over CT to [facility]

## 2020-05-24 NOTE — ED Notes (Signed)
Patient transported to CT 

## 2020-05-24 NOTE — ED Notes (Signed)
Pt lying in bed at this time quiet and calm -- per provider plan for admission.  NS bolus infusing via gravity to 20G R FA.  Urine and COVID PCR collections still pending.  Will monitor for acute changes and maintain plan of care.

## 2020-05-24 NOTE — ED Notes (Addendum)
(  correct time 2250hrs) Pt lying in bed awake, quiet and calm -- cooperative at this time with staff replacing lead that is off - no needs, questions, concerns verbalized as pt awaits hospital to hospital transfer to [facility]

## 2020-05-24 NOTE — ED Notes (Signed)
Patient vomiting again, MD notified.

## 2020-05-24 NOTE — ED Notes (Signed)
Patient aware of need for urine specimen.

## 2020-05-24 NOTE — ED Notes (Signed)
Dr Joni Fears sent secure chat that patient requesting additional pain medicine

## 2020-05-24 NOTE — ED Notes (Addendum)
Patient crying in pain, no vomiting at this time. MD aware. Awaiting CT.

## 2020-05-24 NOTE — ED Notes (Signed)
Pt awake and alert- reports ongoing generalized abd pain rated 4/10 described as constant, tight pain with associated n/v - denies diarrhea/fever/chills-- pt states she is still nauseated and had last vomiting episode prior to being transported to CT

## 2020-05-25 NOTE — ED Notes (Signed)
Per rep @ [facility] patient is currently on wait list for bed. Transfer pending bed placement

## 2020-08-17 ENCOUNTER — Encounter (HOSPITAL_COMMUNITY): Payer: Self-pay

## 2020-08-17 ENCOUNTER — Other Ambulatory Visit: Payer: Self-pay

## 2020-08-17 ENCOUNTER — Inpatient Hospital Stay (HOSPITAL_COMMUNITY)
Admission: EM | Admit: 2020-08-17 | Discharge: 2020-09-05 | DRG: 064 | Disposition: A | Discharge status: Home Health | Payer: Medicare HMO | Attending: Internal Medicine | Admitting: Internal Medicine

## 2020-08-17 ENCOUNTER — Emergency Department (HOSPITAL_COMMUNITY): Payer: Medicare HMO

## 2020-08-17 DIAGNOSIS — I639 Cerebral infarction, unspecified: Secondary | ICD-10-CM | POA: Diagnosis present

## 2020-08-17 DIAGNOSIS — R1030 Lower abdominal pain, unspecified: Secondary | ICD-10-CM

## 2020-08-17 DIAGNOSIS — Z7982 Long term (current) use of aspirin: Secondary | ICD-10-CM

## 2020-08-17 DIAGNOSIS — Z79899 Other long term (current) drug therapy: Secondary | ICD-10-CM

## 2020-08-17 DIAGNOSIS — R4701 Aphasia: Secondary | ICD-10-CM | POA: Diagnosis present

## 2020-08-17 DIAGNOSIS — D49512 Neoplasm of unspecified behavior of left kidney: Secondary | ICD-10-CM | POA: Diagnosis present

## 2020-08-17 DIAGNOSIS — R29703 NIHSS score 3: Secondary | ICD-10-CM | POA: Diagnosis present

## 2020-08-17 DIAGNOSIS — I63412 Cerebral infarction due to embolism of left middle cerebral artery: Principal | ICD-10-CM | POA: Diagnosis present

## 2020-08-17 DIAGNOSIS — R7401 Elevation of levels of liver transaminase levels: Secondary | ICD-10-CM | POA: Diagnosis not present

## 2020-08-17 DIAGNOSIS — E669 Obesity, unspecified: Secondary | ICD-10-CM | POA: Diagnosis present

## 2020-08-17 DIAGNOSIS — R011 Cardiac murmur, unspecified: Secondary | ICD-10-CM | POA: Diagnosis not present

## 2020-08-17 DIAGNOSIS — I619 Nontraumatic intracerebral hemorrhage, unspecified: Secondary | ICD-10-CM | POA: Diagnosis present

## 2020-08-17 DIAGNOSIS — G894 Chronic pain syndrome: Secondary | ICD-10-CM | POA: Diagnosis present

## 2020-08-17 DIAGNOSIS — S31109D Unspecified open wound of abdominal wall, unspecified quadrant without penetration into peritoneal cavity, subsequent encounter: Secondary | ICD-10-CM

## 2020-08-17 DIAGNOSIS — E876 Hypokalemia: Secondary | ICD-10-CM | POA: Diagnosis present

## 2020-08-17 DIAGNOSIS — Z888 Allergy status to other drugs, medicaments and biological substances status: Secondary | ICD-10-CM | POA: Diagnosis not present

## 2020-08-17 DIAGNOSIS — K219 Gastro-esophageal reflux disease without esophagitis: Secondary | ICD-10-CM | POA: Diagnosis present

## 2020-08-17 DIAGNOSIS — Z20822 Contact with and (suspected) exposure to covid-19: Secondary | ICD-10-CM | POA: Diagnosis present

## 2020-08-17 DIAGNOSIS — R7989 Other specified abnormal findings of blood chemistry: Secondary | ICD-10-CM

## 2020-08-17 DIAGNOSIS — F32A Depression, unspecified: Secondary | ICD-10-CM | POA: Diagnosis present

## 2020-08-17 DIAGNOSIS — Z9884 Bariatric surgery status: Secondary | ICD-10-CM | POA: Diagnosis not present

## 2020-08-17 DIAGNOSIS — Z803 Family history of malignant neoplasm of breast: Secondary | ICD-10-CM | POA: Diagnosis not present

## 2020-08-17 DIAGNOSIS — R778 Other specified abnormalities of plasma proteins: Secondary | ICD-10-CM

## 2020-08-17 DIAGNOSIS — R109 Unspecified abdominal pain: Secondary | ICD-10-CM | POA: Diagnosis present

## 2020-08-17 DIAGNOSIS — Z91040 Latex allergy status: Secondary | ICD-10-CM | POA: Diagnosis not present

## 2020-08-17 DIAGNOSIS — K922 Gastrointestinal hemorrhage, unspecified: Secondary | ICD-10-CM

## 2020-08-17 DIAGNOSIS — R4182 Altered mental status, unspecified: Secondary | ICD-10-CM | POA: Diagnosis present

## 2020-08-17 DIAGNOSIS — E785 Hyperlipidemia, unspecified: Secondary | ICD-10-CM | POA: Diagnosis present

## 2020-08-17 DIAGNOSIS — N390 Urinary tract infection, site not specified: Secondary | ICD-10-CM | POA: Insufficient documentation

## 2020-08-17 DIAGNOSIS — N2889 Other specified disorders of kidney and ureter: Secondary | ICD-10-CM | POA: Diagnosis present

## 2020-08-17 DIAGNOSIS — D649 Anemia, unspecified: Secondary | ICD-10-CM | POA: Diagnosis present

## 2020-08-17 DIAGNOSIS — Z6835 Body mass index (BMI) 35.0-35.9, adult: Secondary | ICD-10-CM

## 2020-08-17 DIAGNOSIS — I4891 Unspecified atrial fibrillation: Secondary | ICD-10-CM

## 2020-08-17 DIAGNOSIS — F419 Anxiety disorder, unspecified: Secondary | ICD-10-CM | POA: Diagnosis present

## 2020-08-17 DIAGNOSIS — G9341 Metabolic encephalopathy: Secondary | ICD-10-CM | POA: Diagnosis present

## 2020-08-17 DIAGNOSIS — I10 Essential (primary) hypertension: Secondary | ICD-10-CM | POA: Diagnosis present

## 2020-08-17 DIAGNOSIS — G939 Disorder of brain, unspecified: Secondary | ICD-10-CM | POA: Diagnosis not present

## 2020-08-17 DIAGNOSIS — Z86718 Personal history of other venous thrombosis and embolism: Secondary | ICD-10-CM | POA: Diagnosis not present

## 2020-08-17 DIAGNOSIS — F418 Other specified anxiety disorders: Secondary | ICD-10-CM | POA: Diagnosis not present

## 2020-08-17 DIAGNOSIS — R059 Cough, unspecified: Secondary | ICD-10-CM | POA: Diagnosis not present

## 2020-08-17 DIAGNOSIS — R0600 Dyspnea, unspecified: Secondary | ICD-10-CM

## 2020-08-17 DIAGNOSIS — R0689 Other abnormalities of breathing: Secondary | ICD-10-CM

## 2020-08-17 DIAGNOSIS — D496 Neoplasm of unspecified behavior of brain: Secondary | ICD-10-CM

## 2020-08-17 LAB — COMPREHENSIVE METABOLIC PANEL
ALT: 27 U/L (ref 0–44)
AST: 76 U/L — ABNORMAL HIGH (ref 15–41)
Albumin: 3.4 g/dL — ABNORMAL LOW (ref 3.5–5.0)
Alkaline Phosphatase: 57 U/L (ref 38–126)
Anion gap: 8 (ref 5–15)
BUN: 13 mg/dL (ref 8–23)
CO2: 30 mmol/L (ref 22–32)
Calcium: 8.9 mg/dL (ref 8.9–10.3)
Chloride: 97 mmol/L — ABNORMAL LOW (ref 98–111)
Creatinine, Ser: 0.73 mg/dL (ref 0.44–1.00)
GFR, Estimated: >60 mL/min (ref >60)
Glucose, Bld: 108 mg/dL — ABNORMAL HIGH (ref 70–99)
Potassium: 3.3 mmol/L — ABNORMAL LOW (ref 3.5–5.1)
Sodium: 135 mmol/L (ref 135–145)
Total Bilirubin: 2.1 mg/dL — ABNORMAL HIGH (ref 0.3–1.2)
Total Protein: 6.6 g/dL (ref 6.5–8.1)

## 2020-08-17 LAB — URINALYSIS, ROUTINE W REFLEX MICROSCOPIC
Bacteria, UA: NONE SEEN
Bilirubin Urine: NEGATIVE
Bilirubin Urine: NEGATIVE
Glucose, UA: NEGATIVE mg/dL
Glucose, UA: NEGATIVE mg/dL
Ketones, ur: NEGATIVE mg/dL
Ketones, ur: NEGATIVE mg/dL
Leukocytes,Ua: NEGATIVE
Leukocytes,Ua: NEGATIVE
Nitrite: NEGATIVE
Nitrite: NEGATIVE
Protein, ur: NEGATIVE mg/dL
Protein, ur: NEGATIVE mg/dL
Specific Gravity, Urine: 1.011 (ref 1.005–1.030)
Specific Gravity, Urine: 1.042 — ABNORMAL HIGH (ref 1.005–1.030)
pH: 8 (ref 5.0–8.0)
pH: 6 (ref 5.0–8.0)

## 2020-08-17 LAB — LACTIC ACID, PLASMA
Lactic Acid, Venous: 1.3 mmol/L (ref 0.5–1.9)
Lactic Acid, Venous: 1.5 mmol/L (ref 0.5–1.9)

## 2020-08-17 LAB — CBC
HCT: 34.1 % — ABNORMAL LOW (ref 36.0–46.0)
Hemoglobin: 11.3 g/dL — ABNORMAL LOW (ref 12.0–15.0)
MCH: 29.5 pg (ref 26.0–34.0)
MCHC: 33.1 g/dL (ref 30.0–36.0)
MCV: 89 fL (ref 80.0–100.0)
Platelets: 147 10*3/uL — ABNORMAL LOW (ref 150–400)
RBC: 3.83 MIL/uL — ABNORMAL LOW (ref 3.87–5.11)
RDW: 14.6 % (ref 11.5–15.5)
WBC: 4.6 10*3/uL (ref 4.0–10.5)
nRBC: 0 % (ref 0.0–0.2)

## 2020-08-17 LAB — RAPID URINE DRUG SCREEN, HOSP PERFORMED
Amphetamines: NOT DETECTED
Barbiturates: NOT DETECTED
Benzodiazepines: NOT DETECTED
Cocaine: NOT DETECTED
Opiates: NOT DETECTED
Tetrahydrocannabinol: NOT DETECTED

## 2020-08-17 LAB — TROPONIN I (HIGH SENSITIVITY)
Troponin I (High Sensitivity): 21 ng/L — ABNORMAL HIGH (ref <18)
Troponin I (High Sensitivity): 23 ng/L — ABNORMAL HIGH (ref <18)

## 2020-08-17 LAB — PROTIME-INR
INR: 1.1 (ref 0.8–1.2)
Prothrombin Time: 14 seconds (ref 11.4–15.2)

## 2020-08-17 LAB — CBG MONITORING, ED: Glucose-Capillary: 106 mg/dL — ABNORMAL HIGH (ref 70–99)

## 2020-08-17 LAB — AMMONIA: Ammonia: 29 umol/L (ref 9–35)

## 2020-08-17 LAB — POC OCCULT BLOOD, ED: Fecal Occult Bld: POSITIVE — AB

## 2020-08-17 MED ORDER — MORPHINE SULFATE (PF) 2 MG/ML IV SOLN
2.0000 mg | INTRAVENOUS | Status: DC | PRN
  Administered 2020-08-17 – 2020-08-18 (×2): 2 mg via INTRAVENOUS
  Filled 2020-08-17 (×3): qty 1

## 2020-08-17 MED ORDER — SODIUM CHLORIDE 0.9 % IV BOLUS
1000.0000 mL | Freq: Once | INTRAVENOUS | Status: AC
  Administered 2020-08-17: 1000 mL via INTRAVENOUS

## 2020-08-17 MED ORDER — SODIUM CHLORIDE 0.9 % IV SOLN
1.0000 g | INTRAVENOUS | Status: DC
  Administered 2020-08-17 – 2020-08-18 (×2): 1 g via INTRAVENOUS
  Filled 2020-08-17 (×2): qty 10

## 2020-08-17 MED ORDER — IOHEXOL 300 MG/ML  SOLN
100.0000 mL | Freq: Once | INTRAMUSCULAR | Status: AC | PRN
  Administered 2020-08-17: 100 mL via INTRAVENOUS

## 2020-08-17 NOTE — ED Notes (Signed)
Ok to talk to [employer] per Daughter Jolene Provost.

## 2020-08-17 NOTE — H&P (Incomplete)
History and Physical  Morgan Dixon VEL:[id number] DOB: Feb 01, 1951 DOA: 08/17/2020  Referring physician: Marcello Fennel, PA-C PCP: Alfonse Flavors, MD  Patient coming from: Home  Chief Complaint: Altered mental status  HPI: Morgan Dixon is a 70 y.o. female with medical history significant for GERD, hypertension, chronic pain, s/p gastric bypass who presents to the emergency department accompanied by a family friend due to altered mental status.  At bedside, patient's mental status appears to have improved, she complained of 1 day onset of lower abdominal pain and burning sensation on urination with several days onset of urinary frequency.  Abdominal pain was sharp and persistent, it was rated at 5-6/10 on pain scale, there was no known alleviating/aggravating factors.  She denies nausea, vomiting or diarrhea.  Last bowel movement was yesterday and was normal, she denies blood in stool or black stool.  Per daughter (by phone), patient has chronic history of intermittent confusion and forgetfulness which was recently occurring with more frequency whereby patient continues to repeat same questions that has already been answered, it was initially thought that it may be due to dementia, since there was history of dementia in the family. Patient was recently seen at [facility] on 3/29 due to concern for gastric remnant leakage, she had a hernia repair (4/4).  ED Course:  In the emergency department, she was hemodynamically stable, BP was 144/63.  Other vital signs were within normal range.  Work-up in the ED showed normocytic anemia, hypokalemia, elevated AST, T bili 2.1, troponin 21 > 23.  FOBT was positive.  Urinalysis was unimpressive for UTI.  Chest x-ray showed no acute abnormality CT abdomen and pelvis with contrast showed no acute or masses within the abdomen or pelvis and no evidence of bowel obstruction or inflammation.  Small left renal cortical mass which is larger than prior exam  was noted.  CT of head without contrast showed hypoattenuation left temporoparietal lobe with an appearance worrisome for neoplasm. IV hydration was provided, neurosurgery at [facility] was consulted and recommended admitting patient to [facility] for closer observation.  Daughter( by phone) and patient preferred to stay here at [facility] to do the MRI, since they will only go to [facility] if any abnormality is noted on the MRI.  Hospitalist was asked admit patient for further evaluation and management.  Review of Systems: Constitutional: Negative for chills and fever.  HENT: Negative for ear pain and sore throat.   Eyes: Negative for pain and visual disturbance.  Respiratory: Negative for cough, chest tightness and shortness of breath.   Cardiovascular: Negative for chest pain and palpitations.  Gastrointestinal: Positive for lower abdominal pain and negative for vomiting.  Endocrine: Negative for polyphagia and polyuria.  Genitourinary: Positive for burning sensation on urination, increased urinary frequency.  Negative for decreased urine volume, enuresis, hematuria Musculoskeletal: Negative for arthralgias and back pain.  Skin: Negative for color change and rash.  Allergic/Immunologic: Negative for immunocompromised state.  Neurological: Negative for tremors, syncope, speech difficulty, weakness, light-headedness and headaches.  Hematological: Does not bruise/bleed easily.  All other systems reviewed and are negative   Past Medical History:  Diagnosis Date  . Chronic pain   . Edema extremities    Past Surgical History:  Procedure Laterality Date  . ABDOMINAL HYSTERECTOMY  1989  . BACK SURGERY    . GASTRIC BYPASS      Social History:  reports that she has never smoked. She has never used smokeless tobacco. She reports that she does not drink  alcohol and does not use drugs.   Allergies  Allergen Reactions  . Diazepam Rash  . Penicillins Itching  . Rofecoxib Swelling  . Latex Itching    When used  gloves in the past  . Prozac [Fluoxetine Hcl] Itching and Rash  . Zinc Oxide [[place]] Rash    Family History  Problem Relation Age of Onset  . Breast cancer Maternal Aunt 84    ***  Prior to Admission medications   Medication Sig Start Date End Date Taking? Authorizing Provider  aspirin EC 81 MG tablet Take 81 mg by mouth daily.   Yes [provider]  buprenorphine (SUBUTEX) 8 MG SUBL SL tablet Place 8 mg under the tongue 3 (three) times daily.   Yes [provider]  buPROPion (WELLBUTRIN SR) 150 MG 12 hr tablet Take by mouth. 05/18/20  Yes [provider]  enoxaparin (LOVENOX) 40 MG/0.4ML injection Inject 40 mg into the skin daily.   Yes [provider]  fluticasone [facility] ALLERGY RELIEF) 50 MCG/ACT nasal spray 2 spray in each nostril 05/21/18  Yes [provider]  hydrochlorothiazide (HYDRODIURIL) 25 MG tablet Take 1 tablet by mouth daily. 06/07/20  Yes [provider]  ondansetron (ZOFRAN-ODT) 4 MG disintegrating tablet Take by mouth. 08/12/20  Yes [provider]  pantoprazole (PROTONIX) 40 MG tablet Take by mouth. 07/25/20 07/25/21 Yes [provider]  potassium chloride (KLOR-CON) 10 MEQ tablet Take by mouth. 08/12/20 08/26/20 Yes [provider]  sertraline (ZOLOFT) 25 MG tablet Take 1 tablet by mouth daily. 05/18/20  Yes [provider]  tiZANidine (ZANAFLEX) 4 MG tablet 1 tablet as needed 06/16/20  Yes [provider]  acetaminophen (TYLENOL) 500 MG tablet Take 500 mg by mouth every 6 (six) hours as needed.    [provider]  buprenorphine (SUBUTEX) 8 MG SUBL SL tablet  11/29/17   [provider]  celecoxib (CELEBREX) 100 MG capsule Take 100 mg by mouth every 12 (twelve) hours. 07/24/20   [provider]  cloNIDine (CATAPRES) 0.1 MG tablet 1 tab po tid x 2 days, then bid x 2 days, then once daily x 2 days 123456   Delora Fuel, MD  Cyanocobalamin (VITAMIN B12) 1000  MCG TBCR 1 tablet    [provider]  cyclobenzaprine (FLEXERIL) 10 MG tablet TAKE 1 TABLET BY MOUTH TWICE DAILY AS NEEDED FOR MUSCLE SPASM 11/30/17   [provider]  diclofenac sodium (VOLTAREN) 1 % GEL  12/09/17   [provider]  furosemide (LASIX) 80 MG tablet Take by mouth.    [provider]  gabapentin (NEURONTIN) 300 MG capsule  11/29/17   [provider]  Liraglutide -Weight Management ([place]) 18 MG/3ML SOPN  11/19/17   [provider]  lisinopril (PRINIVIL,ZESTRIL) 10 MG tablet Take by mouth.    [provider]  loperamide (IMODIUM) 2 MG capsule Take 2 mg by mouth 2 (two) times daily. 07/24/20   [provider]  methocarbamol (ROBAXIN) 750 MG tablet Take 750 mg by mouth 2 (two) times daily. 07/24/20   [provider]  Multiple Vitamin (MULTIVITAMIN) capsule Take by mouth.    [provider]  ondansetron (ZOFRAN) 4 MG tablet Take 1 tablet (4 mg total) by mouth every 6 (six) hours as needed. 123456   Delora Fuel, MD  oxyCODONE (OXY IR/ROXICODONE) 5 MG immediate release tablet SMARTSIG:1-3 Tablet(s) By Mouth Every 4 Hours PRN 07/24/20   [provider]  oxyCODONE-acetaminophen (PERCOCET/ROXICET) 5-325 MG tablet  Take by mouth. 06/01/20   [provider]  potassium chloride (KLOR-CON) 10 MEQ tablet Take 10 mEq by mouth 2 (two) times daily. 08/12/20   [provider]  pregabalin (LYRICA) 100 MG capsule Take 100 mg by mouth 3 (three) times daily. 08/12/20   [provider]  tapentadol HCl (NUCYNTA) 75 MG tablet 1-2 po TID prn 01/27/18   [provider]    Physical Exam: BP (!) 149/60   Pulse 66   Temp 99.3 F (37.4 C) (Oral)   Resp 16   Ht 5\' 6"  (1.676 m)   Wt 97.5 kg   SpO2 98%   BMI 34.70 kg/m   . General: 70 y.o. year-old female well developed in no acute distress.  Alert and oriented x3. Marland Kitchen HEENT: NCAT, EOMI . Neck: Supple, trachea  medial . Cardiovascular: Regular rate and rhythm with no rubs or gallops.  No thyromegaly or JVD noted.  No lower extremity edema. 2/4 pulses in all 4 extremities. Marland Kitchen Respiratory: Clear to auscultation with no wheezes or rales. Good inspiratory effort. . Abdomen: Soft nontender nondistended with normal bowel sounds x4 quadrants. . Muskuloskeletal: No cyanosis, clubbing or edema noted bilaterally . Neuro: CN II-XII intact, sensation, reflexes intact . Skin: No ulcerative lesions noted or rashes . Psychiatry: Mood is appropriate for condition and setting          Labs on Admission:  Basic Metabolic Panel: Recent Labs  Lab 08/17/20 1407  NA 135  K 3.3*  CL 97*  CO2 30  GLUCOSE 108*  BUN 13  CREATININE 0.73  CALCIUM 8.9   Liver Function Tests: Recent Labs  Lab 08/17/20 1407  AST 76*  ALT 27  ALKPHOS 57  BILITOT 2.1*  PROT 6.6  ALBUMIN 3.4*   No results for input(s): LIPASE, AMYLASE in the last 168 hours. Recent Labs  Lab 08/17/20 1722  AMMONIA 29   CBC: Recent Labs  Lab 08/17/20 1407  WBC 4.6  HGB 11.3*  HCT 34.1*  MCV 89.0  PLT 147*   Cardiac Enzymes: No results for input(s): CKTOTAL, CKMB, CKMBINDEX, TROPONINI in the last 168 hours.  BNP (last 3 results) No results for input(s): BNP in the last 8760 hours.  ProBNP (last 3 results) No results for input(s): PROBNP in the last 8760 hours.  CBG: Recent Labs  Lab 08/17/20 1404  GLUCAP 106*    Radiological Exams on Admission: CT Head Wo Contrast  Result Date: 08/17/2020 CLINICAL DATA:  Altered mental status. EXAM: CT HEAD WITHOUT CONTRAST TECHNIQUE: Contiguous axial images were obtained from the base of the skull through the vertex without intravenous contrast. COMPARISON:  None. FINDINGS: Brain: Hypoattenuation in the posterior left temporal and parietal lobes is mainly in the white matter with overlying cortex largely preserved worrisome for tumor. No hemorrhage, mass, midline shift or abnormal  extra-axial fluid collection is identified. No hydrocephalus or pneumocephalus. Vascular: No hyperdense vessel or unexpected calcification. Skull: Intact.  No focal lesion. Sinuses/Orbits: Negative. Other: None. IMPRESSION: Hypoattenuation left temporoparietal lobe has an appearance worrisome for neoplasm. Recommend brain MRI with and without contrast. Electronically Signed   By: Inge Rise M.D.   On: 08/17/2020 16:46   CT Abdomen Pelvis W Contrast  Result Date: 08/17/2020 CLINICAL DATA:  Abdominal pain. Recent gastric bypass surgery. Positive Hemoccult. EXAM: CT ABDOMEN AND PELVIS WITH CONTRAST TECHNIQUE: Multidetector CT imaging of the abdomen and pelvis was performed using the standard protocol following bolus administration of intravenous contrast. CONTRAST:  133mL OMNIPAQUE  IOHEXOL 300 MG/ML  SOLN COMPARISON:  05/24/2020. FINDINGS: Lower chest: No acute abnormality. Hepatobiliary: Liver normal in size. No mass or focal lesion. Status post cholecystectomy. No bile duct dilation. Pancreas: Pancreatic atrophy.  No mass.  No inflammation. Spleen: Prominent spleen, 13 cm in greatest dimension, unchanged. No splenic masses. Adrenals/Urinary Tract: No adrenal masses. Kidneys are normal in overall size and position. Symmetric renal enhancement and excretion. Small mass arises from the posteromedial midpole of the left kidney, 1.3 cm in greatest dimension, increased in size from the prior CT where it measured 1 cm. Mass has average Hounsfield units of 54. No other renal masses, no stones and no hydronephrosis. Ureters are normal in course and in caliber. Bladder is unremarkable. Stomach/Bowel: Stomach demonstrates postsurgical changes. Small gastric remnant. Stomach appears to into the into a gastrojejunostomy. Duodenum appears ligated, lying porta hepatis. Small bowel and colon are normal in caliber. No wall thickening. No evidence of inflammation. Vascular/Lymphatic: No significant vascular findings are  present. No enlarged abdominal or pelvic lymph nodes. Reproductive: Status post hysterectomy. No adnexal masses. Other: No abdominal wall hernia. Scarring in the midline upper abdomen where there was a small hernia on the prior exam. No ascites. Musculoskeletal: No fracture or acute finding. No bone lesion. Stable spine stimulator leads along the posterior lower thoracic spinal canal. IMPRESSION: 1. No acute abnormalities within the abdomen or pelvis. No evidence of bowel obstruction or inflammation. 2. 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 No evidence of a surgery complication. 3. Previously seen ventral hernia has been repaired. No residual or new abdominal wall hernia. 4. Small left renal cortical mass, posteromedial midpole, which is larger than prior exam. This could reflect a small renal neoplasm. Recommend follow-up renal MRI without and with contrast, if this patient can tolerate that procedure. If patient cannot have MRI, recommend follow-up CT the abdomen without and with contrast in 3-6 months to assess for stability. Electronically Signed   By: Lajean Manes M.D.   On: 08/17/2020 16:48   DG Chest Port 1 View  Result Date: 08/17/2020 CLINICAL DATA:  Altered mental status and fevers EXAM: PORTABLE CHEST 1 VIEW COMPARISON:  12/11/2012 FINDINGS: Cardiac shadow is within normal limits. Postsurgical changes are again seen and stable. The lungs are well aerated without focal infiltrate. No acute bony abnormality is seen. IMPRESSION: No acute abnormality noted. Electronically Signed   By: Inez Catalina M.D.   On: 08/17/2020 18:48    EKG: I independently viewed the EKG done and my findings are as followed: Normal sinus rhythm at a rate of 63 bpm  Assessment/Plan Present on Admission: . Abdominal pain  Principal Problem:   Abdominal pain Active Problems:   Altered mental status   Acute lower UTI   Hypokalemia   GI bleed   Elevated AST (SGOT)    Elevated troponin I level   Left renal mass   Brain lesion  Abdominal pain Patient complained of lower abdominal pain due to unknown cause CT abdomen and pelvis with contrast showed no acute abnormalities within the abdominal pelvis Continue IV morphine 2 mg every 4 hours as needed  UTI POA She complained of dysuria and urinary frequency, though urinalysis was unimpressive for UTI Continue IV ceftriaxone  Altered mental status-improved Mental status improved at bedside, she was alert and oriented x3 Continue fall precaution and neurochecks Continue to monitor patient.  Hypokalemia K+ 3.3, this will be replenished  GI bleed FOBT was positive, though patient denied  black stool or bright red blood per rectum Continue IV Protonix H/H11.3/34.1; this was 12.6/38.4 about 2 months ago Gastroenterologist will be consulted in the morning     Elevated troponin possible secondary to type II demand ischemia Elevated AST Left renal mass Left temporoparietal lobe hypoattenuation      DVT prophylaxis: ***   Code Status: ***   Family Communication: ***   Disposition Plan: ***   Consults called: ***   Admission status: ***     Bernadette Hoit MD [facility] Pager [phone]  If 7PM-7AM, please contact night-coverage [email or web address] Password [facility]  08/17/2020, 11:31 PM

## 2020-08-17 NOTE — ED Notes (Signed)
EDP notified of positive hemoccult

## 2020-08-17 NOTE — ED Notes (Signed)
Pt with AMS and unable to sign MSE.

## 2020-08-17 NOTE — ED Triage Notes (Signed)
Pt brought to ED via [place] for AMS. Pt was found by community health nurse and noted to be altered. Unknown LKW. Pt with severe abdominal pain, recent gastric bypass surgery. CBG 138. IV 20 R AC, Fentanyl 100 mcg PTA. Pt oriented to name, and place. Confused on year of birth and current year.

## 2020-08-17 NOTE — ED Notes (Signed)
Oral care done.

## 2020-08-17 NOTE — ED Provider Notes (Signed)
[facility] EMERGENCY DEPARTMENT Provider Note   CSN: [id number] Arrival date & time: 08/17/20  1346     History Chief Complaint  Patient presents with  . Altered Mental Status    Morgan Dixon is a 70 y.o. female.  HPI   We will defer HPI due to level 5 caveat altered mental status.   Patient with significant medical history of gastric bypass 1986, recurrent dilation of the gastric remnant, venting gastrotomy tube, chronic pain, presents to the emergency department altered.  Unable to obtain much information patient as she is altered, she was able to tell me her name, where she was, but was unaware of what was going on or what day it was.  She noted that she had some slight pain in her stomach but cannot tell me where, denies any nausea vomiting diarrhea or constipation.    After reviewing patient's chart she has extensive abdominal history, most of her care is at [facility], she was recently seen at [facility] on 03/30 concern for leak from her remnant this was unremarkable, she then had a hernia repair and was later discharged on 04/9.  From triage note patient presents altered, her at home parents noted that she was acting funny and sent here for further evaluation. Past Medical History:  Diagnosis Date  . Chronic pain   . Edema extremities     Patient Active Problem List   Diagnosis Date Noted  . Abdominal pain 08/17/2020  . Bilateral lower extremity edema 12/12/2017  . Pain in both lower extremities 12/12/2017    Past Surgical History:  Procedure Laterality Date  . ABDOMINAL HYSTERECTOMY  1989  . BACK SURGERY    . GASTRIC BYPASS       OB History   No obstetric history on file.     Family History  Problem Relation Age of Onset  . Breast cancer Maternal Aunt 77    Social History   Tobacco Use  . Smoking status: Never Smoker  . Smokeless tobacco: Never Used  Vaping Use  . Vaping Use: Never used  Substance Use Topics  . Alcohol use: Never  . Drug use: Never     Home Medications Prior to Admission medications   Medication Sig Start Date End Date Taking? Authorizing Provider  aspirin EC 81 MG tablet Take 81 mg by mouth daily.   Yes [provider]  buprenorphine (SUBUTEX) 8 MG SUBL SL tablet Place 8 mg under the tongue 3 (three) times daily.   Yes [provider]  buPROPion (WELLBUTRIN SR) 150 MG 12 hr tablet Take by mouth. 05/18/20  Yes [provider]  enoxaparin (LOVENOX) 40 MG/0.4ML injection Inject 40 mg into the skin daily.   Yes [provider]  fluticasone [facility] ALLERGY RELIEF) 50 MCG/ACT nasal spray 2 spray in each nostril 05/21/18  Yes [provider]  hydrochlorothiazide (HYDRODIURIL) 25 MG tablet Take 1 tablet by mouth daily. 06/07/20  Yes [provider]  ondansetron (ZOFRAN-ODT) 4 MG disintegrating tablet Take by mouth. 08/12/20  Yes [provider]  pantoprazole (PROTONIX) 40 MG tablet Take by mouth. 07/25/20 07/25/21 Yes [provider]  potassium chloride (KLOR-CON) 10 MEQ tablet Take by mouth. 08/12/20 08/26/20 Yes [provider]  sertraline (ZOLOFT) 25 MG tablet Take 1 tablet by mouth daily. 05/18/20  Yes [provider]  tiZANidine (ZANAFLEX) 4 MG tablet 1 tablet as needed 06/16/20  Yes [provider]  acetaminophen (TYLENOL) 500 MG tablet Take 500 mg by mouth  every 6 (six) hours as needed.    [provider]  buprenorphine (SUBUTEX) 8 MG SUBL SL tablet  11/29/17   [provider]  celecoxib (CELEBREX) 100 MG capsule Take 100 mg by mouth every 12 (twelve) hours. 07/24/20   [provider]  cloNIDine (CATAPRES) 0.1 MG tablet 1 tab po tid x 2 days, then bid x 2 days, then once daily x 2 days 09/81/19   Delora Fuel, MD  Cyanocobalamin (VITAMIN B12) 1000 MCG TBCR 1 tablet    [provider]  cyclobenzaprine (FLEXERIL) 10 MG tablet TAKE 1 TABLET BY MOUTH TWICE DAILY AS NEEDED FOR MUSCLE SPASM 11/30/17   [provider]  diclofenac sodium (VOLTAREN) 1 % GEL  12/09/17   [provider]  furosemide (LASIX) 80 MG tablet Take by mouth.    [provider]  gabapentin (NEURONTIN) 300 MG capsule  11/29/17   [provider]  Liraglutide -Weight Management ([place]) 18 MG/3ML SOPN  11/19/17   [provider]  lisinopril (PRINIVIL,ZESTRIL) 10 MG tablet Take by mouth.    [provider]  loperamide (IMODIUM) 2 MG capsule Take 2 mg by mouth 2 (two) times daily. 07/24/20   [provider]  methocarbamol (ROBAXIN) 750 MG tablet Take 750 mg by mouth 2 (two) times daily. 07/24/20   [provider]  Multiple Vitamin (MULTIVITAMIN) capsule Take by mouth.    [provider]  ondansetron (ZOFRAN) 4 MG tablet Take 1 tablet (4 mg total) by mouth every 6 (six) hours as needed. 14/78/29   Delora Fuel, MD  oxyCODONE (OXY IR/ROXICODONE) 5 MG immediate release tablet SMARTSIG:1-3 Tablet(s) By Mouth Every 4 Hours PRN 07/24/20   [provider]  oxyCODONE-acetaminophen (PERCOCET/ROXICET) 5-325 MG tablet Take by mouth. 06/01/20   [provider]  potassium chloride (KLOR-CON) 10 MEQ tablet Take 10 mEq by mouth 2 (two) times daily. 08/12/20   [provider]  pregabalin (LYRICA) 100 MG capsule Take 100 mg by mouth 3 (three) times daily. 08/12/20   [provider]  tapentadol HCl (NUCYNTA) 75 MG tablet 1-2 po TID prn 01/27/18   [provider]    Allergies    Diazepam, Penicillins, Rofecoxib, Latex, Prozac [fluoxetine hcl], and Zinc oxide [mexsana]  Review of Systems   Review of Systems  Physical Exam Updated Vital Signs BP (!) 143/66   Pulse 62   Temp 99.3 F (37.4 C) (Oral)   Resp 17   Ht 5\' 6"  (1.676 m)   Wt 97.5 kg   SpO2 94%   BMI 34.70 kg/m   Physical Exam Vitals and nursing note reviewed.  Constitutional:      General: She is not in acute distress.    Appearance: She is ill-appearing.      Comments: Patient appears to be in a deconditioned state  HENT:     Head: Normocephalic and atraumatic.     Nose: No congestion.     Mouth/Throat:     Mouth: Mucous membranes are moist.     Pharynx: Oropharynx is clear.  Eyes:     Extraocular Movements: Extraocular movements intact.     Conjunctiva/sclera: Conjunctivae normal.     Pupils: Pupils are equal, round, and reactive to light.  Cardiovascular:     Rate and Rhythm: Normal rate and regular rhythm.     Pulses: Normal pulses.     Heart sounds: Murmur heard.  No friction rub. No gallop.      Comments: Patient has  a systolic murmur Pulmonary:     Effort: No respiratory distress.     Breath sounds: No wheezing, rhonchi or rales.  Abdominal:     General: There is no distension.     Palpations: Abdomen is soft.     Tenderness: There is no abdominal tenderness. There is no right CVA tenderness or left CVA tenderness.     Comments: Abdomen was visualized she has a noted wound in her right upper quadrant, with packing placed in, there is no surrounding erythema or edema, no drainage or discharge present, area is nontender to palpation, no fluctuant induration noted.    Abdomen was nondistended, normative bowel sounds, dull to percussion, and was nontender, no guarding, rebound tenderness, peritoneal sign.  Musculoskeletal:     Right lower leg: No edema.     Left lower leg: No edema.  Skin:    General: Skin is warm and dry.  Neurological:     Mental Status: She is alert.  Psychiatric:        Mood and Affect: Mood normal.     ED Results / Procedures / Treatments   Labs (all labs ordered are listed, but only abnormal results are displayed) Labs Reviewed  COMPREHENSIVE METABOLIC PANEL - Abnormal; Notable for the following components:      Result Value   Potassium 3.3 (*)    Chloride 97 (*)    Glucose, Bld 108 (*)    Albumin 3.4 (*)    AST 76 (*)    Total Bilirubin 2.1 (*)    All other components within normal limits  CBC  - Abnormal; Notable for the following components:   RBC 3.83 (*)    Hemoglobin 11.3 (*)    HCT 34.1 (*)    Platelets 147 (*)    All other components within normal limits  URINALYSIS, ROUTINE W REFLEX MICROSCOPIC - Abnormal; Notable for the following components:   Hgb urine dipstick MODERATE (*)    Bacteria, UA RARE (*)    Non Squamous Epithelial 0-5 (*)    All other components within normal limits  CBG MONITORING, ED - Abnormal; Notable for the following components:   Glucose-Capillary 106 (*)    All other components within normal limits  POC OCCULT BLOOD, ED - Abnormal; Notable for the following components:   Fecal Occult Bld POSITIVE (*)    All other components within normal limits  TROPONIN I (HIGH SENSITIVITY) - Abnormal; Notable for the following components:   Troponin I (High Sensitivity) 21 (*)    All other components within normal limits  TROPONIN I (HIGH SENSITIVITY) - Abnormal; Notable for the following components:   Troponin I (High Sensitivity) 23 (*)    All other components within normal limits  CULTURE, BLOOD (ROUTINE X 2)  CULTURE, BLOOD (ROUTINE X 2)  URINE CULTURE  LACTIC ACID, PLASMA  LACTIC ACID, PLASMA  PROTIME-INR  AMMONIA  RAPID URINE DRUG SCREEN, HOSP PERFORMED  URINALYSIS, ROUTINE W REFLEX MICROSCOPIC    EKG EKG Interpretation  Date/Time:  Wednesday Aug 17 2020 13:55:47 EDT Ventricular Rate:  59 PR Interval:  167 QRS Duration: 101 QT Interval:  412 QTC Calculation: 409 R Axis:   14 Text Interpretation: Sinus rhythm Supraventricular bigeminy , new since last tracing Confirmed by Dorie Rank [phone]) on 08/17/2020 5:57:03 PM   Radiology CT Head Wo Contrast  Result Date: 08/17/2020 CLINICAL DATA:  Altered mental status. EXAM: CT HEAD WITHOUT CONTRAST TECHNIQUE: Contiguous axial images were obtained from the base of the  skull through the vertex without intravenous contrast. COMPARISON:  None. FINDINGS: Brain: Hypoattenuation in the posterior left  temporal and parietal lobes is mainly in the white matter with overlying cortex largely preserved worrisome for tumor. No hemorrhage, mass, midline shift or abnormal extra-axial fluid collection is identified. No hydrocephalus or pneumocephalus. Vascular: No hyperdense vessel or unexpected calcification. Skull: Intact.  No focal lesion. Sinuses/Orbits: Negative. Other: None. IMPRESSION: Hypoattenuation left temporoparietal lobe has an appearance worrisome for neoplasm. Recommend brain MRI with and without contrast. Electronically Signed   By: Inge Rise M.D.   On: 08/17/2020 16:46   CT Abdomen Pelvis W Contrast  Result Date: 08/17/2020 CLINICAL DATA:  Abdominal pain. Recent gastric bypass surgery. Positive Hemoccult. EXAM: CT ABDOMEN AND PELVIS WITH CONTRAST TECHNIQUE: Multidetector CT imaging of the abdomen and pelvis was performed using the standard protocol following bolus administration of intravenous contrast. CONTRAST:  177mL OMNIPAQUE IOHEXOL 300 MG/ML  SOLN COMPARISON:  05/24/2020. FINDINGS: Lower chest: No acute abnormality. Hepatobiliary: Liver normal in size. No mass or focal lesion. Status post cholecystectomy. No bile duct dilation. Pancreas: Pancreatic atrophy.  No mass.  No inflammation. Spleen: Prominent spleen, 13 cm in greatest dimension, unchanged. No splenic masses. Adrenals/Urinary Tract: No adrenal masses. Kidneys are normal in overall size and position. Symmetric renal enhancement and excretion. Small mass arises from the posteromedial midpole of the left kidney, 1.3 cm in greatest dimension, increased in size from the prior CT where it measured 1 cm. Mass has average Hounsfield units of 54. No other renal masses, no stones and no hydronephrosis. Ureters are normal in course and in caliber. Bladder is unremarkable. Stomach/Bowel: Stomach demonstrates postsurgical changes. Small gastric remnant. Stomach appears to into the into a gastrojejunostomy. Duodenum appears ligated, lying porta  hepatis. Small bowel and colon are normal in caliber. No wall thickening. No evidence of inflammation. Vascular/Lymphatic: No significant vascular findings are present. No enlarged abdominal or pelvic lymph nodes. Reproductive: Status post hysterectomy. No adnexal masses. Other: No abdominal wall hernia. Scarring in the midline upper abdomen where there was a small hernia on the prior exam. No ascites. Musculoskeletal: No fracture or acute finding. No bone lesion. Stable spine stimulator leads along the posterior lower thoracic spinal canal. IMPRESSION: 1. No acute abnormalities within the abdomen or pelvis. No evidence of bowel obstruction or inflammation. 2. 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 No evidence of a surgery complication. 3. Previously seen ventral hernia has been repaired. No residual or new abdominal wall hernia. 4. Small left renal cortical mass, posteromedial midpole, which is larger than prior exam. This could reflect a small renal neoplasm. Recommend follow-up renal MRI without and with contrast, if this patient can tolerate that procedure. If patient cannot have MRI, recommend follow-up CT the abdomen without and with contrast in 3-6 months to assess for stability. Electronically Signed   By: Lajean Manes M.D.   On: 08/17/2020 16:48   DG Chest Port 1 View  Result Date: 08/17/2020 CLINICAL DATA:  Altered mental status and fevers EXAM: PORTABLE CHEST 1 VIEW COMPARISON:  12/11/2012 FINDINGS: Cardiac shadow is within normal limits. Postsurgical changes are again seen and stable. The lungs are well aerated without focal infiltrate. No acute bony abnormality is seen. IMPRESSION: No acute abnormality noted. Electronically Signed   By: Inez Catalina M.D.   On: 08/17/2020 18:48    Procedures Procedures   Medications Ordered in ED Medications  morphine 2 MG/ML injection 2 mg (2 mg Intravenous  Given 08/17/20 2111)  sodium chloride 0.9 % bolus  1,000 mL (1,000 mLs Intravenous New Bag/Given 08/17/20 1457)  iohexol (OMNIPAQUE) 300 MG/ML solution 100 mL (100 mLs Intravenous Contrast Given 08/17/20 1615)    ED Course  I have reviewed the triage vital signs and the nursing notes.  Pertinent labs & imaging results that were available during my care of the patient were reviewed by me and considered in my medical decision making (see chart for details).    MDM Rules/Calculators/A&P                         Initial impression-patient presents with altered mental status.  She appears ill appearing, vital signs notable for fever, concern for possible intra-abdominal abnormality as she had recent surgery.  Will obtain basic lab work-up, obtain CT head for altered mental status, CT abdomen pelvis for possible abdominal infection and reassess.  Work-up-CBC shows normocytic anemia appears to be a baseline for patient, hemoglobin 11.3, CMP shows hypokalemia 3.3, hyperglycemia 108, elevated AST of 76, elevated T bili of 2.1 appears to be baseline for patient.  Prothrombin time 14, INR 1.1, Hemoccult positive.  First  troponin is 21, UA negative for nitrates, leukocytes, no red blood cells or white blood cells, rare bacteria, rapid urine  Negative, lactic 1.5.  CT head shows possible neoplasm in the left temporoparietal lobe.  Recommend MRI with and without contrast for further evaluation.  CT abdomen pelvis reveals small left renal cortical mass concern for possible small renal neoplasm recommend MRI without contrast for further evaluation.  Reassessment patient reassessed, has no complaints this time, updated her on imaging concerning for possible metastatic disease.  Patient is agreement for admission at this time.  Will consult hospitalist for admission.  Consult-consulted hospitalist Dr. Josephine Cables the hospitalist team, he is concerned that patient need to be followed by neurosurgery.  He would like me to talk with neurosurgery see if they rather her here or  transfer to [facility].  Spoke with Pincus Large of neurosurgery she recommends transferring patient to [facility], admitted to the hospitalist team and they will come down and consult after MRI of brain without contrast was completed.  Spoke with Dr.Adefeso who spoke with patient and daughter who is at bedside and they would like to stay here at [facility] till MRI has been completed.  Patient will be admitted at  [facility] and MRI will be completed.  Rule out- I have a low suspicion for systemic infection as patient is nontoxic-appearing, vital signs reassuring, no obvious source infection on my exam.  Patient was noted to be febrile on presentation but I suspect this may be secondary due to possible malignancy which should cause elevated temperature, she has no lactic, no leukocytosis, urine and chest are both negative for acute findings. I have low suspicion for intracranial head bleed or CVA she is no neurodeficits on my exam, head CT negative for acute bleeds.  Does show possible neoplasm this could be explanation for her altered mental status.  Low suspicion for liver or gallbladder normalities as she has no right upper quadrant pain, lab work appears at baseline, CT abdomen pelvis negative for acute findings. low Suspicion for UTI, pyelonephritis, kidney stones as UA is negative for signs of infection or hematuria, CT ab pelvis reveals possible neoplasm in the left kidney suspect this may be the source of her neoplasm noted in her head.  low suspicion for metabolic encephalopathy as  liver enzymes are within normal limits, ammonia level was 29.  Low suspicion for ACS as EKG shows no signs of ischemia, patient had negative delta troponin.  She does have an elevated troponin possibly sleeping secondary to renal function.  Low suspicion patient in the emergent endoscopy or colonoscopy as abdomen soft nontender to palpation, vital signs are reassuring, patient's hemoglobin within normal limits.  Suspect  patient will need further GI work-up for GI bleed as she is Hemoccult positive.  Plan-admit to medicine due to altered mental status.  Patient will be turned over to hospitalist team.  1.  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  Will need MRI with and without contrast of left kidney as well as brain.  2.  Elevated troponin-doubt ACS at this time suspect this might be due to renal insufficiency, possible neoplasm in the left kidney is causing this..  3.  Hemoccult positive-suspect patient may have GI bleed, will need GI work-up.       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

## 2020-08-17 NOTE — H&P (Signed)
History and Physical  Morgan Dixon HYW:[id number] DOB: 25-Feb-1951 DOA: 08/17/2020  Referring physician: Marcello Fennel, PA-C PCP: Alfonse Flavors, MD  Patient coming from: Home  Chief Complaint: Altered mental status  HPI: Morgan Dixon is a 70 y.o. female with medical history significant for GERD, hypertension, chronic pain, s/p gastric bypass who presents to the emergency department accompanied by a family friend due to altered mental status.  At bedside, patient's mental status appears to have improved, she complained of 1 day onset of lower abdominal pain and burning sensation on urination with several days onset of urinary frequency.  Abdominal pain was sharp and persistent, it was rated at 5-6/10 on pain scale, there was no known alleviating/aggravating factors.  She denies nausea, vomiting or diarrhea.  Last bowel movement was yesterday and was normal, she denies blood in stool or black stool.  Per daughter (by phone), patient has chronic history of intermittent confusion and forgetfulness which was recently occurring with more frequency whereby patient continues to repeat same questions that has already been answered, it was initially thought that it may be due to dementia, since there was history of dementia in the family. Patient was recently seen at [facility] on 3/29 due to concern for gastric remnant leakage, she had a hernia repair (4/4).  ED Course:  In the emergency department, she was hemodynamically stable, BP was 144/63.  Other vital signs were within normal range.  Work-up in the ED showed normocytic anemia, hypokalemia, elevated AST, T bili 2.1, troponin 21 > 23.  FOBT was positive.  Urinalysis was unimpressive for UTI.  Chest x-ray showed no acute abnormality CT abdomen and pelvis with contrast showed no acute or masses within the abdomen or pelvis and no evidence of bowel obstruction or inflammation.  Small left renal cortical mass which is larger than prior exam  was noted.  CT of head without contrast showed hypoattenuation left temporoparietal lobe with an appearance worrisome for neoplasm. IV hydration was provided, neurosurgery at [facility] was consulted and recommended admitting patient to [facility] for closer observation.  Daughter( by phone) and patient preferred to stay here at [facility] to do the MRI, since they will only go to [facility] if any abnormality is noted on the MRI.  Hospitalist was asked admit patient for further evaluation and management.  Review of Systems: Constitutional: Negative for chills and fever.  HENT: Negative for ear pain and sore throat.   Eyes: Negative for pain and visual disturbance.  Respiratory: Negative for cough, chest tightness and shortness of breath.   Cardiovascular: Negative for chest pain and palpitations.  Gastrointestinal: Positive for lower abdominal pain and negative for vomiting.  Endocrine: Negative for polyphagia and polyuria.  Genitourinary: Positive for burning sensation on urination, increased urinary frequency.  Negative for decreased urine volume, enuresis, hematuria Musculoskeletal: Negative for arthralgias and back pain.  Skin: Negative for color change and rash.  Allergic/Immunologic: Negative for immunocompromised state.  Neurological: Negative for tremors, syncope, speech difficulty, weakness, light-headedness and headaches.  Hematological: Does not bruise/bleed easily.  All other systems reviewed and are negative   Past Medical History:  Diagnosis Date  . Chronic pain   . Edema extremities    Past Surgical History:  Procedure Laterality Date  . ABDOMINAL HYSTERECTOMY  1989  . BACK SURGERY    . GASTRIC BYPASS      Social History:  reports that she has never smoked. She has never used smokeless tobacco. She reports that she does not drink  alcohol and does not use drugs.   Allergies  Allergen Reactions  . Diazepam Rash  . Penicillins Itching  . Rofecoxib Swelling  . Latex Itching    When used  gloves in the past  . Prozac [Fluoxetine Hcl] Itching and Rash  . Zinc Oxide [Mexsana] Rash    Family History  Problem Relation Age of Onset  . Breast cancer Maternal Aunt 60     Prior to Admission medications   Medication Sig Start Date End Date Taking? Authorizing Provider  aspirin EC 81 MG tablet Take 81 mg by mouth daily.   Yes [provider]  buprenorphine (SUBUTEX) 8 MG SUBL SL tablet Place 8 mg under the tongue 3 (three) times daily.   Yes [provider]  buPROPion (WELLBUTRIN SR) 150 MG 12 hr tablet Take by mouth. 05/18/20  Yes [provider]  enoxaparin (LOVENOX) 40 MG/0.4ML injection Inject 40 mg into the skin daily.   Yes [provider]  fluticasone [facility] ALLERGY RELIEF) 50 MCG/ACT nasal spray 2 spray in each nostril 05/21/18  Yes [provider]  hydrochlorothiazide (HYDRODIURIL) 25 MG tablet Take 1 tablet by mouth daily. 06/07/20  Yes [provider]  ondansetron (ZOFRAN-ODT) 4 MG disintegrating tablet Take by mouth. 08/12/20  Yes [provider]  pantoprazole (PROTONIX) 40 MG tablet Take by mouth. 07/25/20 07/25/21 Yes [provider]  potassium chloride (KLOR-CON) 10 MEQ tablet Take by mouth. 08/12/20 08/26/20 Yes [provider]  sertraline (ZOLOFT) 25 MG tablet Take 1 tablet by mouth daily. 05/18/20  Yes [provider]  tiZANidine (ZANAFLEX) 4 MG tablet 1 tablet as needed 06/16/20  Yes [provider]  acetaminophen (TYLENOL) 500 MG tablet Take 500 mg by mouth every 6 (six) hours as needed.    [provider]  buprenorphine (SUBUTEX) 8 MG SUBL SL tablet  11/29/17   [provider]  celecoxib (CELEBREX) 100 MG capsule Take 100 mg by mouth every 12 (twelve) hours. 07/24/20   [provider]  cloNIDine (CATAPRES) 0.1 MG tablet 1 tab po tid x 2 days, then bid x 2 days, then once daily x 2 days 03/49/17   Delora Fuel, MD  Cyanocobalamin (VITAMIN B12) 1000 MCG  TBCR 1 tablet    [provider]  cyclobenzaprine (FLEXERIL) 10 MG tablet TAKE 1 TABLET BY MOUTH TWICE DAILY AS NEEDED FOR MUSCLE SPASM 11/30/17   [provider]  diclofenac sodium (VOLTAREN) 1 % GEL  12/09/17   [provider]  furosemide (LASIX) 80 MG tablet Take by mouth.    [provider]  gabapentin (NEURONTIN) 300 MG capsule  11/29/17   [provider]  Liraglutide -Weight Management (McLeod) 18 MG/3ML SOPN  11/19/17   [provider]  lisinopril (PRINIVIL,ZESTRIL) 10 MG tablet Take by mouth.    [provider]  loperamide (IMODIUM) 2 MG capsule Take 2 mg by mouth 2 (two) times daily. 07/24/20   [provider]  methocarbamol (ROBAXIN) 750 MG tablet Take 750 mg by mouth 2 (two) times daily. 07/24/20   [provider]  Multiple Vitamin (MULTIVITAMIN) capsule Take by mouth.    [provider]  ondansetron (ZOFRAN) 4 MG tablet Take 1 tablet (4 mg total) by mouth every 6 (six) hours as needed. 91/50/56   Delora Fuel, MD  oxyCODONE (OXY IR/ROXICODONE) 5 MG immediate release tablet SMARTSIG:1-3 Tablet(s) By Mouth Every 4 Hours PRN 07/24/20   [provider]  oxyCODONE-acetaminophen (PERCOCET/ROXICET) 5-325 MG tablet Take  by mouth. 06/01/20   [provider]  potassium chloride (KLOR-CON) 10 MEQ tablet Take 10 mEq by mouth 2 (two) times daily. 08/12/20   [provider]  pregabalin (LYRICA) 100 MG capsule Take 100 mg by mouth 3 (three) times daily. 08/12/20   [provider]  tapentadol HCl (NUCYNTA) 75 MG tablet 1-2 po TID prn 01/27/18   [provider]    Physical Exam: BP (!) 149/60   Pulse 66   Temp 99.3 F (37.4 C) (Oral)   Resp 16   Ht 5\' 6"  (1.676 m)   Wt 97.5 kg   SpO2 98%   BMI 34.70 kg/m   . General: 70 y.o. year-old female well developed in no acute distress.  Alert and oriented x3. Marland Kitchen HEENT: NCAT, EOMI . Neck: Supple, trachea  medial . Cardiovascular: Regular rate and rhythm with no rubs or gallops.  No thyromegaly or JVD noted.  No lower extremity edema. 2/4 pulses in all 4 extremities. Marland Kitchen Respiratory: Clear to auscultation with no wheezes or rales. Good inspiratory effort. . Abdomen: Soft nontender nondistended with normal bowel sounds x4 quadrants. . Muskuloskeletal: No cyanosis, clubbing or edema noted bilaterally . Neuro: CN II-XII intact, sensation, reflexes intact . Skin: No ulcerative lesions noted or rashes . Psychiatry: Mood is appropriate for condition and setting          Labs on Admission:  Basic Metabolic Panel: Recent Labs  Lab 08/17/20 1407  NA 135  K 3.3*  CL 97*  CO2 30  GLUCOSE 108*  BUN 13  CREATININE 0.73  CALCIUM 8.9   Liver Function Tests: Recent Labs  Lab 08/17/20 1407  AST 76*  ALT 27  ALKPHOS 57  BILITOT 2.1*  PROT 6.6  ALBUMIN 3.4*   No results for input(s): LIPASE, AMYLASE in the last 168 hours. Recent Labs  Lab 08/17/20 1722  AMMONIA 29   CBC: Recent Labs  Lab 08/17/20 1407  WBC 4.6  HGB 11.3*  HCT 34.1*  MCV 89.0  PLT 147*   Cardiac Enzymes: No results for input(s): CKTOTAL, CKMB, CKMBINDEX, TROPONINI in the last 168 hours.  BNP (last 3 results) No results for input(s): BNP in the last 8760 hours.  ProBNP (last 3 results) No results for input(s): PROBNP in the last 8760 hours.  CBG: Recent Labs  Lab 08/17/20 1404  GLUCAP 106*    Radiological Exams on Admission: CT Head Wo Contrast  Result Date: 08/17/2020 CLINICAL DATA:  Altered mental status. EXAM: CT HEAD WITHOUT CONTRAST TECHNIQUE: Contiguous axial images were obtained from the base of the skull through the vertex without intravenous contrast. COMPARISON:  None. FINDINGS: Brain: Hypoattenuation in the posterior left temporal and parietal lobes is mainly in the white matter with overlying cortex largely preserved worrisome for tumor. No hemorrhage, mass, midline shift or abnormal  extra-axial fluid collection is identified. No hydrocephalus or pneumocephalus. Vascular: No hyperdense vessel or unexpected calcification. Skull: Intact.  No focal lesion. Sinuses/Orbits: Negative. Other: None. IMPRESSION: Hypoattenuation left temporoparietal lobe has an appearance worrisome for neoplasm. Recommend brain MRI with and without contrast. Electronically Signed   By: Inge Rise M.D.   On: 08/17/2020 16:46   CT Abdomen Pelvis W Contrast  Result Date: 08/17/2020 CLINICAL DATA:  Abdominal pain. Recent gastric bypass surgery. Positive Hemoccult. EXAM: CT ABDOMEN AND PELVIS WITH CONTRAST TECHNIQUE: Multidetector CT imaging of the abdomen and pelvis was performed using the standard protocol following bolus administration of intravenous contrast. CONTRAST:  169mL OMNIPAQUE IOHEXOL  300 MG/ML  SOLN COMPARISON:  05/24/2020. FINDINGS: Lower chest: No acute abnormality. Hepatobiliary: Liver normal in size. No mass or focal lesion. Status post cholecystectomy. No bile duct dilation. Pancreas: Pancreatic atrophy.  No mass.  No inflammation. Spleen: Prominent spleen, 13 cm in greatest dimension, unchanged. No splenic masses. Adrenals/Urinary Tract: No adrenal masses. Kidneys are normal in overall size and position. Symmetric renal enhancement and excretion. Small mass arises from the posteromedial midpole of the left kidney, 1.3 cm in greatest dimension, increased in size from the prior CT where it measured 1 cm. Mass has average Hounsfield units of 54. No other renal masses, no stones and no hydronephrosis. Ureters are normal in course and in caliber. Bladder is unremarkable. Stomach/Bowel: Stomach demonstrates postsurgical changes. Small gastric remnant. Stomach appears to into the into a gastrojejunostomy. Duodenum appears ligated, lying porta hepatis. Small bowel and colon are normal in caliber. No wall thickening. No evidence of inflammation. Vascular/Lymphatic: No significant vascular findings are  present. No enlarged abdominal or pelvic lymph nodes. Reproductive: Status post hysterectomy. No adnexal masses. Other: No abdominal wall hernia. Scarring in the midline upper abdomen where there was a small hernia on the prior exam. No ascites. Musculoskeletal: No fracture or acute finding. No bone lesion. Stable spine stimulator leads along the posterior lower thoracic spinal canal. IMPRESSION: 1. No acute abnormalities within the abdomen or pelvis. No evidence of bowel obstruction or inflammation. 2. 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 No evidence of a surgery complication. 3. Previously seen ventral hernia has been repaired. No residual or new abdominal wall hernia. 4. Small left renal cortical mass, posteromedial midpole, which is larger than prior exam. This could reflect a small renal neoplasm. Recommend follow-up renal MRI without and with contrast, if this patient can tolerate that procedure. If patient cannot have MRI, recommend follow-up CT the abdomen without and with contrast in 3-6 months to assess for stability. Electronically Signed   By: Lajean Manes M.D.   On: 08/17/2020 16:48   DG Chest Port 1 View  Result Date: 08/17/2020 CLINICAL DATA:  Altered mental status and fevers EXAM: PORTABLE CHEST 1 VIEW COMPARISON:  12/11/2012 FINDINGS: Cardiac shadow is within normal limits. Postsurgical changes are again seen and stable. The lungs are well aerated without focal infiltrate. No acute bony abnormality is seen. IMPRESSION: No acute abnormality noted. Electronically Signed   By: Inez Catalina M.D.   On: 08/17/2020 18:48    EKG: I independently viewed the EKG done and my findings are as followed: Normal sinus rhythm at a rate of 63 bpm  Assessment/Plan Present on Admission: . Abdominal pain  Principal Problem:   Abdominal pain Active Problems:   Altered mental status   Acute lower UTI   Hypokalemia   GI bleed   Elevated AST (SGOT)    Elevated troponin I level   Left renal mass   Brain lesion  Abdominal pain Patient complained of lower abdominal pain due to unknown cause CT abdomen and pelvis with contrast showed no acute abnormalities within the abdominal pelvis Continue IV morphine 2 mg every 4 hours as needed  UTI POA She complained of dysuria and urinary frequency, though urinalysis was unimpressive for UTI Continue IV ceftriaxone  Altered mental status-improved Mental status improved at bedside, she was alert and oriented x3 Continue fall precaution and neurochecks Continue to monitor patient.  Hypokalemia K+ 3.3, this will be replenished   GI bleed FOBT was positive, though patient denied  black stool or bright red blood per rectum Continue IV Protonix H/H11.3/34.1; this was 12.6/38.4 about 2 months ago Gastroenterologist will be consulted in the morning  Elevated troponin possible secondary to type II demand ischemia Troponin 21 > 23, patient denies any chest pain EKG personally reviewed showed normal sinus rhythm at rate of 63 bpm Continue to trend troponin  Elevated AST AST 76, no known cause at this time Continue to monitor liver enzymes  Left renal mass CT abdomen and pelvis showed small left renal cortical mass which is larger than prior exam Follow-up renal MRI without and with contrast recommended  Left temporoparietal lobe hypoattenuation CT of head without contrast showed hypoattenuation left temporoparietal lobe with an appearance worrisome for neoplasm.  Brain MRI with and without contrast recommended Daughter states that patient has several years of increasing confusion and dementia, these symptoms have recently worsened and was thought may be due to patient's altered mental status as mentioned above. ED PA consulted with neurosurgery at [facility] and it was recommended for patient to be admitted to [facility], however, patient and daughter (by phone) will prefer to go to [facility] if any abnormality shows  up in the MRI, so they decided for patient to stay here at [facility] and do MRI in the morning, with plan to go to [facility] if needed.  Obesity (BMI 34.7) Patient will be counseled on diet and lifestyle modification   DVT prophylaxis: SCDs  Code Status: Full code  Family Communication: Family friend at bedside, daughter (by phone), all questions answered to satisfaction  Disposition Plan:  Patient is from:                        home Anticipated DC to:                   SNF or family members home Anticipated DC date:               2-3 days Anticipated DC barriers:           Patient requires inpatient management of UTI, abdominal pain, imaging studies for left renal mass and brain hypoattenuation, as well as pending GI consult   Consults called: Gastroenterology  Admission status: Inpatient   Bernadette Hoit MD [facility]  08/17/2020, 11:31 PM

## 2020-08-17 NOTE — ED Notes (Signed)
Lab in room for blood draw

## 2020-08-18 ENCOUNTER — Encounter (HOSPITAL_COMMUNITY): Payer: Self-pay | Admitting: Internal Medicine

## 2020-08-18 ENCOUNTER — Inpatient Hospital Stay (HOSPITAL_COMMUNITY): Payer: Medicare HMO

## 2020-08-18 DIAGNOSIS — N390 Urinary tract infection, site not specified: Secondary | ICD-10-CM | POA: Diagnosis not present

## 2020-08-18 DIAGNOSIS — G9341 Metabolic encephalopathy: Secondary | ICD-10-CM | POA: Diagnosis present

## 2020-08-18 LAB — CBC
HCT: 31.4 % — ABNORMAL LOW (ref 36.0–46.0)
Hemoglobin: 10.3 g/dL — ABNORMAL LOW (ref 12.0–15.0)
MCH: 29.3 pg (ref 26.0–34.0)
MCHC: 32.8 g/dL (ref 30.0–36.0)
MCV: 89.5 fL (ref 80.0–100.0)
Platelets: 141 10*3/uL — ABNORMAL LOW (ref 150–400)
RBC: 3.51 MIL/uL — ABNORMAL LOW (ref 3.87–5.11)
RDW: 15.1 % (ref 11.5–15.5)
WBC: 4.3 10*3/uL (ref 4.0–10.5)
nRBC: 0 % (ref 0.0–0.2)

## 2020-08-18 LAB — GLUCOSE, CAPILLARY: Glucose-Capillary: 151 mg/dL — ABNORMAL HIGH (ref 70–99)

## 2020-08-18 LAB — COMPREHENSIVE METABOLIC PANEL
ALT: 23 U/L (ref 0–44)
AST: 64 U/L — ABNORMAL HIGH (ref 15–41)
Albumin: 2.9 g/dL — ABNORMAL LOW (ref 3.5–5.0)
Alkaline Phosphatase: 49 U/L (ref 38–126)
Anion gap: 6 (ref 5–15)
BUN: 9 mg/dL (ref 8–23)
CO2: 30 mmol/L (ref 22–32)
Calcium: 8.5 mg/dL — ABNORMAL LOW (ref 8.9–10.3)
Chloride: 100 mmol/L (ref 98–111)
Creatinine, Ser: 0.74 mg/dL (ref 0.44–1.00)
GFR, Estimated: >60 mL/min (ref >60)
Glucose, Bld: 97 mg/dL (ref 70–99)
Potassium: 3.2 mmol/L — ABNORMAL LOW (ref 3.5–5.1)
Sodium: 136 mmol/L (ref 135–145)
Total Bilirubin: 2.1 mg/dL — ABNORMAL HIGH (ref 0.3–1.2)
Total Protein: 5.7 g/dL — ABNORMAL LOW (ref 6.5–8.1)

## 2020-08-18 LAB — PROTIME-INR
INR: 1.1 (ref 0.8–1.2)
Prothrombin Time: 14.5 seconds (ref 11.4–15.2)

## 2020-08-18 LAB — TROPONIN I (HIGH SENSITIVITY)
Troponin I (High Sensitivity): 24 ng/L — ABNORMAL HIGH (ref <18)
Troponin I (High Sensitivity): 21 ng/L — ABNORMAL HIGH (ref <18)

## 2020-08-18 LAB — APTT: aPTT: 29 seconds (ref 24–36)

## 2020-08-18 LAB — HIV ANTIBODY (ROUTINE TESTING W REFLEX): HIV Screen 4th Generation wRfx: NONREACTIVE

## 2020-08-18 LAB — SARS CORONAVIRUS 2 (TAT 6-24 HRS): SARS Coronavirus 2: NEGATIVE

## 2020-08-18 LAB — PHOSPHORUS: Phosphorus: 3.5 mg/dL (ref 2.5–4.6)

## 2020-08-18 LAB — MAGNESIUM: Magnesium: 1.8 mg/dL (ref 1.7–2.4)

## 2020-08-18 MED ORDER — SODIUM CHLORIDE 0.9 % IV SOLN: INTRAVENOUS | Status: DC

## 2020-08-18 MED ORDER — POTASSIUM CHLORIDE CRYS ER 20 MEQ PO TBCR
40.0000 meq | EXTENDED_RELEASE_TABLET | Freq: Once | ORAL | Status: AC
  Administered 2020-08-18: 40 meq via ORAL
  Filled 2020-08-18: qty 2

## 2020-08-18 MED ORDER — OXYCODONE HCL 5 MG PO TABS
5.0000 mg | ORAL_TABLET | ORAL | Status: DC | PRN
Start: 2020-08-18 — End: 2020-08-25
  Administered 2020-08-18 – 2020-08-25 (×26): 5 mg via ORAL
  Filled 2020-08-18 (×27): qty 1

## 2020-08-18 MED ORDER — SERTRALINE HCL 50 MG PO TABS
25.0000 mg | ORAL_TABLET | Freq: Every day | ORAL | Status: DC
  Administered 2020-08-18 – 2020-08-28 (×11): 25 mg via ORAL
  Filled 2020-08-18 (×11): qty 1

## 2020-08-18 MED ORDER — PANTOPRAZOLE SODIUM 40 MG IV SOLR
40.0000 mg | INTRAVENOUS | Status: DC
  Administered 2020-08-18 – 2020-08-23 (×6): 40 mg via INTRAVENOUS
  Filled 2020-08-18 (×6): qty 40

## 2020-08-18 MED ORDER — METHOCARBAMOL 500 MG PO TABS
500.0000 mg | ORAL_TABLET | Freq: Three times a day (TID) | ORAL | Status: DC
  Administered 2020-08-18 – 2020-08-28 (×30): 500 mg via ORAL
  Filled 2020-08-18 (×30): qty 1

## 2020-08-18 MED ORDER — ALPRAZOLAM 0.5 MG PO TABS
0.5000 mg | ORAL_TABLET | Freq: Two times a day (BID) | ORAL | Status: DC | PRN
  Administered 2020-08-18 – 2020-08-26 (×3): 0.5 mg via ORAL
  Filled 2020-08-18 (×3): qty 1

## 2020-08-18 MED ORDER — FENTANYL CITRATE (PF) 100 MCG/2ML IJ SOLN
25.0000 ug | INTRAMUSCULAR | Status: DC | PRN
Start: 2020-08-18 — End: 2020-08-24
  Administered 2020-08-18 – 2020-08-24 (×19): 25 ug via INTRAVENOUS
  Filled 2020-08-18 (×20): qty 2

## 2020-08-18 MED ORDER — CHLORHEXIDINE GLUCONATE CLOTH 2 % EX PADS
6.0000 | MEDICATED_PAD | Freq: Every day | CUTANEOUS | Status: DC
  Administered 2020-08-18 – 2020-08-20 (×3): 6 via TOPICAL

## 2020-08-18 NOTE — Progress Notes (Signed)
Patient Demographics:    Morgan Dixon, is a 70 y.o. female, DOB - 03-Feb-1951, WVP:[id number]  Admit date - 08/17/2020   Admitting Physician Bernadette Hoit, DO  Outpatient Primary MD for the patient is Zhou-Talbert, [name], MD  LOS - 1   Chief Complaint  Patient presents with  . 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 Newman Pies from [place] [facility] accepting in consultation, patient will stay on a [facility] as the primary team - A/p 1)Brain Lesion-left parietal and left posterior temporal lobe--- may be able to do MRI of the brain (patient used to have a spinal stimulator but the generator has been removed she may still have some wires), if possible left over wires from prior spinal stimulator precludes MRI brain imaging,  I spoke with Dr. Newman Pies on 08/18/20 , he recommends considering CT head with and  without contrast at that time (if unable to do MRI brain) -Please call and notify Dr. Newman Pies of [place] [facility] when patient arrives at [place] so patient can be seen in consultation  2)S/p Prior Billroth 2 procedure Now with Possible GI bleed--- -Heme positive stool on admission  -Discussed with GI team at [facility] -Please reconsult GI service at [facility] after neurosurgical evaluation unless patient starts bleeding actively or H&H drops significantly -Continue Protonix and monitor H&H  3)Left Renal Mass--  If patient cannot have MRI for further evaluation then, recommend follow-up CT the abdomen without and with contrast in 3-6 months to assess for stability.  4)Anterior abdominal wall wound--- please see photos in epic - anterior abdominal wall with clean base dime size open wound  , does Not look infected  5)Acute Anemia-- Hgb is down to 10.3 from 11.2 on admission after hydration, suspect some hemodilution effect -Hemoglobin usually above 12 -Please see #2 above  6)Possible UTI--- low clinical index of suspicion, continue Rocephin pending urine culture results  7)Chronic pain syndrome--previously had a neurostimulator, patient sees pain clinic at [facility] is negative -Hold Lyrica, hold Nucynta, hold Neurontin, -Continue methocarbamol ,stop Zanaflex -Okay to use iv fentanyl as needed for severe pain and oxycodone as needed for moderate pain  8)Acute metabolic encephalopathy--suspect secondary to #1  above  9)Depression and Anxiety--okay to continue Zoloft, may use Xanax as needed   Disposition/Need for in-Hospital Stay- patient unable to be discharged at this time due to --acute metabolic encephalopathy due to presumed left-sided brain mass requiring further neurosurgical evaluation  Status is: Inpatient  Remains inpatient appropriate because:Please see disposition above   Disposition: The patient is from: Home              Anticipated d/c is  to: TBD--- may need SNF rehab              Anticipated d/c date is: 2 days              Patient currently is not medically stable to d/c. Barriers: Not Clinically Stable-   Code Status :  -  Code Status: Full Code   Family Communication:   Discussed with pt's daughter [name] and pt';s son [name]   Consults  :  Dr. Newman Pies [facility] neurosurgery)  DVT Prophylaxis  :   - SCDs   SCDs Start: 08/18/20 0050   Lab Results  Component Value Date   PLT 141 (L) 08/18/2020    Inpatient Medications  Scheduled Meds: . Chlorhexidine Gluconate Cloth  6 each Topical Daily  . methocarbamol  500 mg Oral TID  . pantoprazole (PROTONIX) IV  40 mg Intravenous Q24H  . sertraline  25 mg Oral Daily   Continuous Infusions: . cefTRIAXone (ROCEPHIN)  IV Stopped (08/18/20 0000)   PRN Meds:.ALPRAZolam, fentaNYL (SUBLIMAZE) injection, oxyCODONE    Anti-infectives (From admission, onward)   Start     Dose/Rate Route Frequency Ordered Stop   08/17/20 2130  cefTRIAXone (ROCEPHIN) 1 g in sodium chloride 0.9 % 100 mL IVPB        1 g 200 mL/hr over 30 Minutes Intravenous Every 24 hours 08/17/20 2115          Objective:   Vitals:   08/18/20 0500 08/18/20 0600 08/18/20 0835 08/18/20 1000  BP: 135/64 132/75 (!) 148/72 (!) 155/72  Pulse: (!) 58  73 67  Resp: 17 18 (!) 22 18  Temp:      TempSrc:      SpO2: 99% 98% 96% 96%  Weight:      Height:        Wt Readings from Last 3 Encounters:  08/17/20 97.5 kg  05/24/20 110 kg  04/15/19 109.8 kg     Intake/Output Summary (Last 24 hours) at 08/18/2020 1617 Last data filed at 08/17/2020 2355 Gross per 24 hour  Intake --  Output 600 ml  Net -600 ml   Physical Exam  Gen:- Awake , confused, disoriented, difficulty with finding the right words HEENT:- [place].AT, No sclera icterus Neck-Supple Neck,No JVD,.  Lungs-  CTAB , fair symmetrical air movement CV- S1, S2 normal, regular  Abd-  +ve B.Sounds, Abd Soft, No tenderness,  anterior abdominal wall with clean base dime size open wound  , does Not look infected Extremity/Skin:- No  edema, pedal pulses present  Neuro-Psych-affect is anxious, disoriented (confused, disoriented, difficulty with finding the right words), has generalized weakness, no obvious new focal deficits, no tremors   Data Review:   Micro Results Recent Results (from the past 240 hour(s))  Blood culture (routine x 2)     Status: None (Preliminary result)   Collection Time: 08/17/20  3:51 PM   Specimen: Vein; Blood  Result Value Ref Range Status   Specimen Description BLOOD RIGHT HAND  Final  Special Requests   Final    Blood Culture adequate volume BOTTLES DRAWN AEROBIC AND ANAEROBIC Performed at [facility], [address] [place], [place] [address]    Culture PENDING  Incomplete   Report Status PENDING  Incomplete  Blood culture (routine x 2)     Status: None (Preliminary result)   Collection Time: 08/17/20  3:51 PM   Specimen: Vein; Blood  Result Value Ref Range Status   Specimen Description BLOOD LEFT ANTECUBITAL  Final   Special Requests   Final    Blood Culture adequate volume BOTTLES DRAWN AEROBIC AND ANAEROBIC Performed at [facility], [address], [place], [place] [address]    Culture PENDING  Incomplete   Report Status PENDING  Incomplete  SARS CORONAVIRUS 2 (TAT 6-24 HRS) Nasopharyngeal Nasopharyngeal Swab     Status: None   Collection Time: 08/17/20  9:39 PM   Specimen: Nasopharyngeal Swab  Result Value Ref Range Status   SARS Coronavirus 2 NEGATIVE NEGATIVE Final    Comment: (NOTE) SARS-CoV-2 target nucleic acids are NOT DETECTED.  The SARS-CoV-2 RNA is generally detectable in upper and lower respiratory specimens during the acute phase of infection. Negative results do not preclude SARS-CoV-2 infection, do not rule out co-infections with other pathogens, and should not be used as the sole basis for treatment or other patient management decisions. Negative  results must be combined with clinical observations, patient history, and epidemiological information. The expected result is Negative.  Fact Sheet for Patients: SugarRoll.be  Fact Sheet for Healthcare Providers: https://www.woods-mathews.com/  This test is not yet approved or cleared by the [place] FDA and  has been authorized for detection and/or diagnosis of SARS-CoV-2 by FDA under an Emergency Use Authorization (EUA). This EUA will remain  in effect (meaning this test can be used) for the duration of the COVID-19 declaration under Se ction 564(b)(1) of the Act, 21 U.S.C. section 360bbb-3(b)(1), unless the authorization is terminated or revoked sooner.  Performed at [facility], [place] [address] [place], [place] [address]     Radiology Reports CT Head Wo Contrast  Result Date: 08/17/2020 CLINICAL DATA:  Altered mental status. EXAM: CT HEAD WITHOUT CONTRAST TECHNIQUE: Contiguous axial images were obtained from the base of the skull through the vertex without intravenous contrast. COMPARISON:  None. FINDINGS: Brain: Hypoattenuation in the posterior left temporal and parietal lobes is mainly in the white matter with overlying cortex largely preserved worrisome for tumor. No hemorrhage, mass, midline shift or abnormal extra-axial fluid collection is identified. No hydrocephalus or pneumocephalus. Vascular: No hyperdense vessel or unexpected calcification. Skull: Intact.  No focal lesion. Sinuses/Orbits: Negative. Other: None. IMPRESSION: Hypoattenuation left temporoparietal lobe has an appearance worrisome for neoplasm. Recommend brain MRI with and without contrast. Electronically Signed   By: Inge Rise M.D.   On: 08/17/2020 16:46   CT Abdomen Pelvis W Contrast  Result Date: 08/17/2020 CLINICAL DATA:  Abdominal pain. Recent gastric bypass surgery. Positive Hemoccult. EXAM: CT ABDOMEN AND PELVIS WITH CONTRAST TECHNIQUE:  Multidetector CT imaging of the abdomen and pelvis was performed using the standard protocol following bolus administration of intravenous contrast. CONTRAST:  138mL OMNIPAQUE IOHEXOL 300 MG/ML  SOLN COMPARISON:  05/24/2020. FINDINGS: Lower chest: No acute abnormality. Hepatobiliary: Liver normal in size. No mass or focal lesion. Status post cholecystectomy. No bile duct dilation. Pancreas: Pancreatic atrophy.  No mass.  No inflammation. Spleen: Prominent spleen, 13 cm in greatest dimension, unchanged. No splenic masses. Adrenals/Urinary Tract: No adrenal masses. Kidneys  are normal in overall size and position. Symmetric renal enhancement and excretion. Small mass arises from the posteromedial midpole of the left kidney, 1.3 cm in greatest dimension, increased in size from the prior CT where it measured 1 cm. Mass has average Hounsfield units of 54. No other renal masses, no stones and no hydronephrosis. Ureters are normal in course and in caliber. Bladder is unremarkable. Stomach/Bowel: Stomach demonstrates postsurgical changes. Small gastric remnant. Stomach appears to into the into a gastrojejunostomy. Duodenum appears ligated, lying porta hepatis. Small bowel and colon are normal in caliber. No wall thickening. No evidence of inflammation. Vascular/Lymphatic: No significant vascular findings are present. No enlarged abdominal or pelvic lymph nodes. Reproductive: Status post hysterectomy. No adnexal masses. Other: No abdominal wall hernia. Scarring in the midline upper abdomen where there was a small hernia on the prior exam. No ascites. Musculoskeletal: No fracture or acute finding. No bone lesion. Stable spine stimulator leads along the posterior lower thoracic spinal canal. IMPRESSION: 1. No acute abnormalities within the abdomen or pelvis. No evidence of bowel obstruction or inflammation. 2. 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 No evidence of a surgery  complication. 3. Previously seen ventral hernia has been repaired. No residual or new abdominal wall hernia. 4. Small left renal cortical mass, posteromedial midpole, which is larger than prior exam. This could reflect a small renal neoplasm. Recommend follow-up renal MRI without and with contrast, if this patient can tolerate that procedure. If patient cannot have MRI, recommend follow-up CT the abdomen without and with contrast in 3-6 months to assess for stability. Electronically Signed   By: Lajean Manes M.D.   On: 08/17/2020 16:48   DG Chest Port 1 View  Result Date: 08/17/2020 CLINICAL DATA:  Altered mental status and fevers EXAM: PORTABLE CHEST 1 VIEW COMPARISON:  12/11/2012 FINDINGS: Cardiac shadow is within normal limits. Postsurgical changes are again seen and stable. The lungs are well aerated without focal infiltrate. No acute bony abnormality is seen. IMPRESSION: No acute abnormality noted. Electronically Signed   By: Inez Catalina M.D.   On: 08/17/2020 18:48     CBC Recent Labs  Lab 08/17/20 1407 08/18/20 0325  WBC 4.6 4.3  HGB 11.3* 10.3*  HCT 34.1* 31.4*  PLT 147* 141*  MCV 89.0 89.5  MCH 29.5 29.3  MCHC 33.1 32.8  RDW 14.6 15.1    Chemistries  Recent Labs  Lab 08/17/20 1407 08/18/20 0325  NA 135 136  K 3.3* 3.2*  CL 97* 100  CO2 30 30  GLUCOSE 108* 97  BUN 13 9  CREATININE 0.73 0.74  CALCIUM 8.9 8.5*  MG  --  1.8  AST 76* 64*  ALT 27 23  ALKPHOS 57 49  BILITOT 2.1* 2.1*   ------------------------------------------------------------------------------------------------------------------ No results for input(s): CHOL, HDL, LDLCALC, TRIG, CHOLHDL, LDLDIRECT in the last 72 hours.  No results found for: HGBA1C ------------------------------------------------------------------------------------------------------------------ No results for input(s): TSH, T4TOTAL, T3FREE, THYROIDAB in the last 72 hours.  Invalid input(s):  FREET3 ------------------------------------------------------------------------------------------------------------------ No results for input(s): VITAMINB12, FOLATE, FERRITIN, TIBC, IRON, RETICCTPCT in the last 72 hours.  Coagulation profile Recent Labs  Lab 08/17/20 1407 08/18/20 0325  INR 1.1 1.1    No results for input(s): DDIMER in the last 72 hours.  Cardiac Enzymes No results for input(s): CKMB, TROPONINI, MYOGLOBIN in the last 168 hours.  Invalid input(s): CK ------------------------------------------------------------------------------------------------------------------ No results found for: BNP   Roxan Hockey M.D on 08/18/2020 at 4:17 PM  Go to [email or web address] - for contact info  [facility] - Office  [phone]

## 2020-08-18 NOTE — Progress Notes (Signed)
Patient transferred to the unit, vitals stable. Purwick placed. Able to make needs known. Call light in reach. Orientation to unit and room provided. Questions answered.   2 rn skin assessment completed with Arletha Pili, RN. Patient noted to have a wound in her mid abdomen, packed with 1/4in strip and covered with guaze and meplex tape.

## 2020-08-18 NOTE — Consult Note (Signed)
Discussed with Dr. Denton Brick. Patient has stat MRI brain for worsening altered mental status. Patient with complex abdominal surgical history, typically seen at [facility]. GI consulted for heme + stool. No overt GI bleeding. Presenting Hgb 11.3. There is discussion of possible transfer to [facility] pending MRI brain results. Will hold off on GI consult until initial work up completed (given no overt GI bleeding). He will re-consult if services needed.   Laureen Ochs. Bernarda Caffey [facility] [phone] 5/5/20227:52 AM

## 2020-08-18 NOTE — ED Notes (Signed)
Pt awake speaking alert to oriented to self.  Will continue to monitor.

## 2020-08-19 DIAGNOSIS — N390 Urinary tract infection, site not specified: Secondary | ICD-10-CM | POA: Diagnosis not present

## 2020-08-19 LAB — GLUCOSE, CAPILLARY
Glucose-Capillary: 120 mg/dL — ABNORMAL HIGH (ref 70–99)
Glucose-Capillary: 122 mg/dL — ABNORMAL HIGH (ref 70–99)
Glucose-Capillary: 126 mg/dL — ABNORMAL HIGH (ref 70–99)
Glucose-Capillary: 127 mg/dL — ABNORMAL HIGH (ref 70–99)
Glucose-Capillary: 99 mg/dL (ref 70–99)

## 2020-08-19 LAB — MRSA PCR SCREENING: MRSA by PCR: NEGATIVE

## 2020-08-19 LAB — URINE CULTURE: Culture: NO GROWTH

## 2020-08-19 MED ORDER — AMLODIPINE BESYLATE 5 MG PO TABS
5.0000 mg | ORAL_TABLET | Freq: Every day | ORAL | Status: DC
  Administered 2020-08-19 – 2020-09-05 (×18): 5 mg via ORAL
  Filled 2020-08-19 (×18): qty 1

## 2020-08-19 MED ORDER — HYDRALAZINE HCL 20 MG/ML IJ SOLN
10.0000 mg | Freq: Four times a day (QID) | INTRAMUSCULAR | Status: DC | PRN
  Administered 2020-08-22 – 2020-08-23 (×2): 10 mg via INTRAVENOUS
  Filled 2020-08-19 (×2): qty 1

## 2020-08-19 NOTE — Progress Notes (Signed)
Dr. Christella Noa notified of patient arrival.

## 2020-08-19 NOTE — Evaluation (Signed)
Physical Therapy Evaluation Patient Details Name: Morgan Dixon MRN: [id number] DOB: May 11, 1950 Today's Date: 08/19/2020   History of Present Illness  Morgan Dixon is a 70 y.o. female with medical history significant for GERD, hypertension, chronic pain, s/p gastric bypass who presents to the emergency department accompanied by a family friend due to altered mental status.  At bedside, patient's mental status appears to have improved, she complained of 1 day onset of lower abdominal pain and burning sensation on urination with several days onset of urinary frequency.  Abdominal pain was sharp and persistent, it was rated at 5-6/10 on pain scale, there was no known alleviating/aggravating factors.  She denies nausea, vomiting or diarrhea.  Last bowel movement was yesterday and was normal, she denies blood in stool or black stool.   Per daughter (by phone), patient has chronic history of intermittent confusion and forgetfulness which was recently occurring with more frequency whereby patient continues to repeat same questions that has already been answered, it was initially thought that it may be due to dementia, since there was history of dementia in the family.  Patient was recently seen at [facility] on 3/29 due to concern for gastric remnant leakage, she had a hernia repair (4/4).    Clinical Impression  Patient demonstrates labored movement for sitting up at bedside and ambulating to commode.  Patient required use of RW due to poor standing balance, limited mostly due to c/o severe stomach pain, briefly sat in chair before requesting to lie down due to pain - RN aware.  Patient will benefit from continued physical therapy in hospital and recommended venue below to increase strength, balance, endurance for safe ADLs and gait.    Follow Up Recommendations SNF;Supervision for mobility/OOB;Supervision - Intermittent    Equipment Recommendations  None recommended by PT    Recommendations for Other  Services       Precautions / Restrictions Precautions Precautions: Fall Restrictions Weight Bearing Restrictions: No      Mobility  Bed Mobility Overal bed mobility: Needs Assistance Bed Mobility: Supine to Sit;Sit to Supine     Supine to sit: Supervision Sit to supine: Supervision        Transfers Overall transfer level: Needs assistance Equipment used: Rolling walker (2 wheeled) Transfers: Sit to/from [facility] Sit to Stand: Min assist Stand pivot transfers: Min assist       General transfer comment: increased time, labored movement  Ambulation/Gait Ambulation/Gait assistance: Min assist Gait Distance (Feet): 15 Feet Assistive device: Rolling walker (2 wheeled) Gait Pattern/deviations: Decreased step length - right;Decreased step length - left;Decreased stride length Gait velocity: decreased   General Gait Details: slow labored cadence with leaning over RW due to BLE weakness, limited mostly due to c/o severe stomach pain  Stairs            Wheelchair Mobility    Modified Rankin (Stroke Patients Only)       Balance Overall balance assessment: Needs assistance Sitting-balance support: Feet supported;No upper extremity supported Sitting balance-Leahy Scale: Fair Sitting balance - Comments: fair/good seated at EOB   Standing balance support: During functional activity;No upper extremity supported Standing balance-Leahy Scale: Poor Standing balance comment: fair using RW                             Pertinent Vitals/Pain Pain Assessment: 0-10 Pain Score: 7  Pain Location: stomach Pain Descriptors / Indicators: Grimacing;Guarding;Aching Pain Intervention(s): Limited activity within patient's tolerance;Monitored  during session;Repositioned;Patient requesting pain meds-RN notified    Home Living Family/patient expects to be discharged to:: Private residence Living Arrangements: Alone Available Help at Discharge:  Other (Comment) (has no help per patient) Type of Home: House Home Access: Ramped entrance     Home Layout: One level Home Equipment: Walker - 2 wheels;Shower seat      Prior Function Level of Independence: Independent with assistive device(s)         Comments: Hydrographic surveyor, drives     Journalist, newspaper        Extremity/Trunk Assessment   Upper Extremity Assessment Upper Extremity Assessment: Generalized weakness    Lower Extremity Assessment Lower Extremity Assessment: Generalized weakness    Cervical / Trunk Assessment Cervical / Trunk Assessment: Normal  Communication   Communication: No difficulties  Cognition Arousal/Alertness: Awake/alert Behavior During Therapy: WFL for tasks assessed/performed Overall Cognitive Status: Within Functional Limits for tasks assessed                                        General Comments      Exercises     Assessment/Plan    PT Assessment Patient needs continued PT services  PT Problem List Decreased strength;Decreased activity tolerance;Decreased balance;Decreased mobility       PT Treatment Interventions DME instruction;Gait training;Stair training;Functional mobility training;Therapeutic activities;Therapeutic exercise;Balance training;Patient/family education    PT Goals (Current goals can be found in the Care Plan section)  Acute Rehab PT Goals Patient Stated Goal: return home able to take care of self PT Goal Formulation: With patient Time For Goal Achievement: 09/02/20 Potential to Achieve Goals: Good    Frequency Min 3X/week   Barriers to discharge        Co-evaluation               AM-PAC PT "6 Clicks" Mobility  Outcome Measure Help needed turning from your back to your side while in a flat bed without using bedrails?: A Little Help needed moving from lying on your back to sitting on the side of a flat bed without using bedrails?: A Little Help needed moving to and from  a bed to a chair (including a wheelchair)?: A Little Help needed standing up from a chair using your arms (e.g., wheelchair or bedside chair)?: A Little Help needed to walk in hospital room?: A Lot Help needed climbing 3-5 steps with a railing? : A Lot 6 Click Score: 16    End of Session   Activity Tolerance: Patient tolerated treatment well;Patient limited by fatigue;Patient limited by pain Patient left: in bed;with call bell/phone within reach Nurse Communication: Mobility status PT Visit Diagnosis: Unsteadiness on feet (R26.81);Other abnormalities of gait and mobility (R26.89);Muscle weakness (generalized) (M62.81)    Time: 2706-2376 PT Time Calculation (min) (ACUTE ONLY): 20 min   Charges:   PT Evaluation $PT Eval Moderate Complexity: 1 Mod PT Treatments $Therapeutic Activity: 8-22 mins        11:16 AM, 08/19/20 Lonell Grandchild, MPT Physical Therapist with [facility] 336 [phone] office [phone] mobile phone

## 2020-08-19 NOTE — TOC Initial Note (Signed)
Transition of Care [facility]) - Initial/Assessment Note    Patient Details  Name: Morgan Dixon MRN: [id number] Date of Birth: 1951/03/30  Transition of Care [facility]) CM/SW Contact:    Shade Flood, LCSW Phone Number: 08/19/2020, 2:02 PM  Clinical Narrative:                  Pt admitted from home where she lives alone. PT recommending SNF rehab. Discussed with pt and family and offered CMS provider options. Daughter requests referrals to [facility] area as it is closer to her.  Will refer. Pt will need insurance authorization. TOC will follow.  Expected Discharge Plan: Skilled Nursing Facility Barriers to Discharge: Continued Medical Work up,Insurance Authorization   Patient Goals and CMS Choice Patient states their goals for this hospitalization and ongoing recovery are:: get better CMS Medicare.gov Compare Post Acute Care list provided to:: Patient Represenative (must comment) Choice offered to / list presented to : Adult Children  Expected Discharge Plan and Services Expected Discharge Plan: [place] In-house Referral: Clinical Social Work   Post Acute Care Choice: [place] Living arrangements for the past 2 months: [place]                                      Prior Living Arrangements/Services Living arrangements for the past 2 months: Single Family Home Lives with:: Self Patient language and need for interpreter reviewed:: Yes Do you feel safe going back to the place where you live?: Yes      Need for Family Participation in Patient Care: Yes (Comment) Care giver support system in place?: No (comment)   Criminal Activity/Legal Involvement Pertinent to Current Situation/Hospitalization: No - Comment as needed  Activities of Daily Living Home Assistive Devices/Equipment: None ADL Screening (condition at time of admission) Patient's cognitive ability adequate to safely complete daily activities?: No Is the patient  deaf or have difficulty hearing?: No Does the patient have difficulty seeing, even when wearing glasses/contacts?: No Does the patient have difficulty concentrating, remembering, or making decisions?: Yes Patient able to express need for assistance with ADLs?: Yes Does the patient have difficulty dressing or bathing?: No Independently performs ADLs?: Yes (appropriate for developmental age) Does the patient have difficulty walking or climbing stairs?: Yes Weakness of Legs: Both Weakness of Arms/Hands: None  Permission Sought/Granted Permission sought to share information with : Chartered certified accountant granted to share information with : Yes, Verbal Permission Granted     Permission granted to share info w AGENCY: SNF        Emotional Assessment       Orientation: : Oriented to Self,Oriented to Place,Oriented to Situation Alcohol / Substance Use: Not Applicable Psych Involvement: No (comment)  Admission diagnosis:  Neoplasm, 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 Acute metabolic encephalopathy 57/32/2025  . Abdominal pain 08/17/2020  . Altered mental status 08/17/2020  . Acute lower UTI 08/17/2020  . Hypokalemia 08/17/2020  . GI bleed 08/17/2020  . Elevated AST (SGOT) 08/17/2020  . Elevated troponin I level 08/17/2020  . Left renal mass 08/17/2020  . 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 Bilateral lower extremity edema 12/12/2017  . Pain in both lower extremities 12/12/2017   PCP:  Curtis Sites  Chauncey Cruel, MD Pharmacy:   [place], [place] - [address] [place] [address] Phone: [phone] Fax: [phone]     Social Determinants of Health (SDOH) Interventions    Readmission Risk  Interventions Readmission Risk Prevention Plan 08/19/2020  Medication Screening Complete  Transportation Screening Complete  Some recent data might be hidden

## 2020-08-19 NOTE — Progress Notes (Signed)
Patient Demographics:    Morgan Dixon, is a 70 y.o. female, DOB - [birth year], DVV:[id number]  Admit date - 08/17/2020   Admitting Physician Bernadette Hoit, DO  Outpatient Primary MD for the patient is Zhou-Talbert, [name], MD  LOS - 2   Chief Complaint  Patient presents with  . 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 Newman Pies from [place] neurosurgery accepting in consultation, patient will stay on a [facility] service as the primary team - A/p 1)Brain Lesion-left parietal and left posterior temporal lobe--- may be able to do MRI of the brain (patient used to have a spinal stimulator but the generator has been removed she may still have some wires), if possible left over wires from prior spinal stimulator precludes MRI brain imaging,  I spoke with Dr. Newman Pies on 08/18/20 , he recommends considering CT head with and without contrast at that time (if unable to do MRI brain) -Please call and notify Dr. Newman Pies of [place]  neurosurgery when patient arrives at [place] so patient can be seen in consultation  2)S/p Prior Billroth 2 procedure Now with Possible GI bleed--- -Heme positive stool on admission  -Discussed with GI team at [facility] -Please reconsult GI service at [facility] after neurosurgical evaluation unless patient starts bleeding actively or H&H drops significantly -Continue Protonix and monitor H&H  3)Left Renal Mass--  If patient cannot have MRI for further evaluation then, recommend follow-up CT the abdomen without and with contrast in 3-6 months to assess for stability.  4)Anterior abdominal wall wound--- please see photos in epic - anterior abdominal wall with clean base dime size open wound  , does Not look infected -Wound care consult appreciated--- recommends Twice daily  place a small piece of saline moistened gauze into the abdominal wound, cover with dry gauze, tape in place.  5)Acute Anemia-- Hgb is down to 10.3 from 11.2 on admission after hydration, suspect some hemodilution effect -Hemoglobin usually above 12 -Please see #2 above  6)Possible UTI--- low clinical index of suspicion,  -Okay to stop Rocephin as urine and blood cultures are negative  7)Chronic pain syndrome--previously had a neurostimulator, patient sees pain clinic at [place] is negative -Hold Lyrica, hold Nucynta, hold Neurontin, -Continue methocarbamol ,stop Zanaflex -Okay to use iv fentanyl as needed  for severe pain and oxycodone as needed for moderate pain  8)Acute metabolic encephalopathy--suspect secondary to #1 above  9)Depression and Anxiety--okay to continue Zoloft, may use Xanax as needed  10) social/ethics--plan of care discussed with patient and her daughter, patient remains a full code without limitations to treatment  Disposition/Need for in-Hospital Stay- patient unable to be discharged at this time due to --acute metabolic encephalopathy due to presumed left-sided brain mass  requiring further neurosurgical evaluation  Status is: Inpatient  Remains inpatient appropriate because:Please see disposition above   Disposition: The patient is from: Home              Anticipated d/c is to: TBD--- may need SNF rehab              Anticipated d/c date is: 2 days              Patient currently is not medically stable to d/c. Barriers: Not Clinically Stable-   Code Status :  -  Code Status: Full Code   Family Communication:   Discussed with pt's daughter Morgan Dixon and pt';s son Morgan Dixon   Consults  :  Dr. Newman Pies [facility] neurosurgery)  DVT Prophylaxis  :   - SCDs   SCDs Start: 08/18/20 0050   Lab Results  Component Value Date   PLT 141 (L) 08/18/2020    Inpatient Medications  Scheduled Meds: . Chlorhexidine Gluconate Cloth  6 each Topical Daily  . methocarbamol  500 mg Oral TID  . pantoprazole (PROTONIX) IV  40 mg Intravenous Q24H  . sertraline  25 mg Oral Daily   Continuous Infusions: . sodium chloride 75 mL/hr at 08/19/20 0600   PRN Meds:.ALPRAZolam, fentaNYL (SUBLIMAZE) injection, oxyCODONE    Anti-infectives (From admission, onward)   Start     Dose/Rate Route Frequency Ordered Stop   08/17/20 2130  cefTRIAXone (ROCEPHIN) 1 g in sodium chloride 0.9 % 100 mL IVPB  Status:  Discontinued        1 g 200 mL/hr over 30 Minutes Intravenous Every 24 hours 08/17/20 2115 08/19/20 0955        Objective:   Vitals:   08/19/20 0400 08/19/20 0600 08/19/20 0700 08/19/20 0737  BP:    (!) 169/57  Pulse: 65 (!) 53 68 (!) 50  Resp:      Temp:      TempSrc:      SpO2: 97% 94% 98% 96%  Weight:      Height:        Wt Readings from Last 3 Encounters:  08/19/20 99.8 kg  05/24/20 110 kg  04/15/19 109.8 kg     Intake/Output Summary (Last 24 hours) at 08/19/2020 1009 Last data filed at 08/19/2020 0600 Gross per 24 hour  Intake 975 ml  Output 751 ml  Net 224 ml   Physical Exam  Gen:- Awake , less confused, still having difficulty  with finding the right words HEENT:- [place].AT, No sclera icterus Neck-Supple Neck,No JVD,.  Lungs-  CTAB , fair symmetrical air movement CV- S1, S2 normal, regular  Abd-  +ve B.Sounds, Abd Soft, No tenderness, anterior abdominal wall with clean base dime size open wound  , does Not look infected Extremity/Skin:- No  edema, pedal pulses present  Neuro-Psych-affect is less anxious, disoriented (confused, disoriented, difficulty with finding the right words), has generalized weakness, no obvious NEW focal deficits, no tremors   Data Review:   Micro Results Recent Results (from the past 240 hour(s))  Blood culture (routine x 2)     Status: None (Preliminary result)   Collection Time: 08/17/20  3:51 PM   Specimen: BLOOD RIGHT HAND  Result Value Ref Range Status   Specimen Description BLOOD RIGHT HAND  Final   Special Requests   Final    Blood Culture adequate volume BOTTLES DRAWN AEROBIC AND ANAEROBIC   Culture   Final    NO GROWTH 2 DAYS Performed at [facility], [address], [place], [place] [address]    Report Status PENDING  Incomplete  Blood culture (routine x 2)     Status: None (Preliminary result)   Collection Time: 08/17/20  3:51 PM   Specimen: BLOOD  Result Value Ref Range Status   Specimen Description BLOOD LEFT ANTECUBITAL  Final   Special Requests   Final    Blood Culture adequate volume BOTTLES DRAWN AEROBIC AND ANAEROBIC   Culture   Final    NO GROWTH 2 DAYS Performed at [facility], [address] [place], [place] [address]    Report Status PENDING  Incomplete  Urine culture     Status: None   Collection Time: 08/17/20  9:00 PM   Specimen: Urine, Random  Result Value Ref Range Status   Specimen Description   Final    URINE, RANDOM Performed at [facility], [address] [place], [place] [address]    Special Requests   Final    NONE Performed at [facility], [address] [place], [place] [address]    Culture   Final    NO GROWTH Performed at [facility], [place] [address], [place], [place] [address]    Report Status 08/19/2020 FINAL  Final  SARS CORONAVIRUS 2 (TAT 6-24 HRS) Nasopharyngeal Nasopharyngeal Swab     Status: None   Collection Time: 08/17/20  9:39 PM   Specimen: Nasopharyngeal Swab  Result Value Ref Range Status   SARS Coronavirus 2 NEGATIVE NEGATIVE Final    Comment: (NOTE) SARS-CoV-2 target nucleic acids are NOT DETECTED.  The SARS-CoV-2 RNA is generally detectable in upper and lower respiratory specimens during the acute phase of infection. Negative results do not preclude SARS-CoV-2 infection, do not rule out co-infections with other pathogens, and should not be used as the sole basis for treatment or other patient management decisions. Negative results must be combined with clinical observations, patient history, and epidemiological information. The expected result is Negative.  Fact Sheet for Patients: SugarRoll.be  Fact Sheet for Healthcare Providers: https://www.woods-mathews.com/  This test is not yet approved or cleared by the [place] FDA and  has been authorized for detection and/or diagnosis of SARS-CoV-2 by FDA under an Emergency Use Authorization (EUA). This EUA will remain  in effect (meaning this test can be used) for the duration of the COVID-19 declaration under Se ction 564(b)(1) of the Act, 21 U.S.C. section 360bbb-3(b)(1), unless the authorization is terminated or revoked sooner.  Performed at [facility], [place] [address], [place], [place] [address]     Radiology Reports CT Head Wo Contrast  Result Date: 08/17/2020 CLINICAL DATA:  Altered mental status. EXAM: CT HEAD WITHOUT CONTRAST TECHNIQUE: Contiguous axial images were obtained from the base of the skull through the vertex without intravenous contrast. COMPARISON:  None. FINDINGS: Brain: Hypoattenuation in the posterior left temporal and parietal lobes is mainly in the white  matter with overlying cortex largely preserved worrisome for tumor. No hemorrhage, mass, midline shift or abnormal extra-axial fluid collection is identified.  No hydrocephalus or pneumocephalus. Vascular: No hyperdense vessel or unexpected calcification. Skull: Intact.  No focal lesion. Sinuses/Orbits: Negative. Other: None. IMPRESSION: Hypoattenuation left temporoparietal lobe has an appearance worrisome for neoplasm. Recommend brain MRI with and without contrast. Electronically Signed   By: Inge Rise M.D.   On: 08/17/2020 16:46   CT Abdomen Pelvis W Contrast  Result Date: 08/17/2020 CLINICAL DATA:  Abdominal pain. Recent gastric bypass surgery. Positive Hemoccult. EXAM: CT ABDOMEN AND PELVIS WITH CONTRAST TECHNIQUE: Multidetector CT imaging of the abdomen and pelvis was performed using the standard protocol following bolus administration of intravenous contrast. CONTRAST:  158mL OMNIPAQUE IOHEXOL 300 MG/ML  SOLN COMPARISON:  05/24/2020. FINDINGS: Lower chest: No acute abnormality. Hepatobiliary: Liver normal in size. No mass or focal lesion. Status post cholecystectomy. No bile duct dilation. Pancreas: Pancreatic atrophy.  No mass.  No inflammation. Spleen: Prominent spleen, 13 cm in greatest dimension, unchanged. No splenic masses. Adrenals/Urinary Tract: No adrenal masses. Kidneys are normal in overall size and position. Symmetric renal enhancement and excretion. Small mass arises from the posteromedial midpole of the left kidney, 1.3 cm in greatest dimension, increased in size from the prior CT where it measured 1 cm. Mass has average Hounsfield units of 54. No other renal masses, no stones and no hydronephrosis. Ureters are normal in course and in caliber. Bladder is unremarkable. Stomach/Bowel: Stomach demonstrates postsurgical changes. Small gastric remnant. Stomach appears to into the into a gastrojejunostomy. Duodenum appears ligated, lying porta hepatis. Small bowel and colon are normal in  caliber. No wall thickening. No evidence of inflammation. Vascular/Lymphatic: No significant vascular findings are present. No enlarged abdominal or pelvic lymph nodes. Reproductive: Status post hysterectomy. No adnexal masses. Other: No abdominal wall hernia. Scarring in the midline upper abdomen where there was a small hernia on the prior exam. No ascites. Musculoskeletal: No fracture or acute finding. No bone lesion. Stable spine stimulator leads along the posterior lower thoracic spinal canal. IMPRESSION: 1. No acute abnormalities within the abdomen or pelvis. No evidence of bowel obstruction or inflammation. 2. 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 No evidence of a surgery complication. 3. Previously seen ventral hernia has been repaired. No residual or new abdominal wall hernia. 4. Small left renal cortical mass, posteromedial midpole, which is larger than prior exam. This could reflect a small renal neoplasm. Recommend follow-up renal MRI without and with contrast, if this patient can tolerate that procedure. If patient cannot have MRI, recommend follow-up CT the abdomen without and with contrast in 3-6 months to assess for stability. Electronically Signed   By: Lajean Manes M.D.   On: 08/17/2020 16:48   DG Chest Port 1 View  Result Date: 08/17/2020 CLINICAL DATA:  Altered mental status and fevers EXAM: PORTABLE CHEST 1 VIEW COMPARISON:  12/11/2012 FINDINGS: Cardiac shadow is within normal limits. Postsurgical changes are again seen and stable. The lungs are well aerated without focal infiltrate. No acute bony abnormality is seen. IMPRESSION: No acute abnormality noted. Electronically Signed   By: Inez Catalina M.D.   On: 08/17/2020 18:48     CBC Recent Labs  Lab 08/17/20 1407 08/18/20 0325  WBC 4.6 4.3  HGB 11.3* 10.3*  HCT 34.1* 31.4*  PLT 147* 141*  MCV 89.0 89.5  MCH 29.5 29.3  MCHC 33.1 32.8  RDW 14.6 15.1    Chemistries  Recent  Labs  Lab 08/17/20 1407 08/18/20 0325  NA 135 136  K 3.3* 3.2*  CL 97*  100  CO2 30 30  GLUCOSE 108* 97  BUN 13 9  CREATININE 0.73 0.74  CALCIUM 8.9 8.5*  MG  --  1.8  AST 76* 64*  ALT 27 23  ALKPHOS 57 49  BILITOT 2.1* 2.1*   ------------------------------------------------------------------------------------------------------------------ No results for input(s): CHOL, HDL, LDLCALC, TRIG, CHOLHDL, LDLDIRECT in the last 72 hours.  No results found for: HGBA1C ------------------------------------------------------------------------------------------------------------------ No results for input(s): TSH, T4TOTAL, T3FREE, THYROIDAB in the last 72 hours.  Invalid input(s): FREET3 ------------------------------------------------------------------------------------------------------------------ No results for input(s): VITAMINB12, FOLATE, FERRITIN, TIBC, IRON, RETICCTPCT in the last 72 hours.  Coagulation profile Recent Labs  Lab 08/17/20 1407 08/18/20 0325  INR 1.1 1.1    No results for input(s): DDIMER in the last 72 hours.  Cardiac Enzymes No results for input(s): CKMB, TROPONINI, MYOGLOBIN in the last 168 hours.  Invalid input(s): CK ------------------------------------------------------------------------------------------------------------------ No results found for: BNP   Roxan Hockey M.D on 08/19/2020 at 10:09 AM  Go to [email or web address] - for contact info  [facility] - Office  [phone]

## 2020-08-19 NOTE — Plan of Care (Signed)
  Problem: Acute Rehab PT Goals(only PT should resolve) Goal: Pt Will Go Supine/Side To Sit Outcome: Progressing Flowsheets (Taken 08/19/2020 1116) Pt will go Supine/Side to Sit: with supervision Goal: Patient Will Transfer Sit To/From Stand Outcome: Progressing Flowsheets (Taken 08/19/2020 1116) Patient will transfer sit to/from stand: with supervision Goal: Pt Will Transfer Bed To Chair/Chair To Bed Outcome: Progressing Flowsheets (Taken 08/19/2020 1116) Pt will Transfer Bed to Chair/Chair to Bed: with supervision Goal: Pt Will Ambulate Outcome: Progressing Flowsheets (Taken 08/19/2020 1116) Pt will Ambulate:  75 feet  with supervision  with rolling walker   11:17 AM, 08/19/20 Lonell Grandchild, MPT Physical Therapist with [facility] [phone] office [phone] mobile phone

## 2020-08-19 NOTE — NC FL2 (Signed)
[place] LEVEL OF CARE SCREENING TOOL     IDENTIFICATION  Patient Name: Morgan Dixon Birthdate: 05/27/1950 Sex: female Admission Date (Current Location): 08/17/2020  County and [place] Number:  [employer] and Address:  [place] [address], [place]      Provider Number: [id number]  Attending Physician Name and Address:  Donne Hazel, MD  Relative Name and Phone Number:       Current Level of Care: Hospital Recommended Level of Care: [place] Prior Approval Number:    Date Approved/Denied:   PASRR Number: [id number]  Discharge Plan: SNF    Current Diagnoses: Patient Active Problem List   Diagnosis Date Noted  . Acute metabolic encephalopathy 30/86/5784  . Abdominal pain 08/17/2020  . Altered mental status 08/17/2020  . Acute lower UTI 08/17/2020  . Hypokalemia 08/17/2020  . GI bleed 08/17/2020  . Elevated AST (SGOT) 08/17/2020  . Elevated troponin I level 08/17/2020  . Left renal mass 08/17/2020  . 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 Bilateral lower extremity edema 12/12/2017  . Pain in both lower extremities 12/12/2017    Orientation RESPIRATION BLADDER Height & Weight     Self,Time,Situation,Place    Incontinent Weight: 220 lb 0.3 oz (99.8 kg) Height:  5\' 6"  (167.6 cm)  BEHAVIORAL SYMPTOMS/MOOD NEUROLOGICAL BOWEL NUTRITION STATUS      Continent Diet (see dc summary)  AMBULATORY STATUS COMMUNICATION OF NEEDS Skin   Extensive Assist Verbally Normal                       Personal Care Assistance Level of Assistance  Bathing,Feeding,Dressing Bathing Assistance: Limited assistance Feeding assistance: Independent Dressing Assistance: Limited assistance     Functional Limitations Info  Sight,Hearing,Speech Sight Info: Adequate Hearing Info: Adequate Speech Info: Adequate    SPECIAL CARE FACTORS FREQUENCY  PT (By licensed PT),OT  (By licensed OT)     PT Frequency: 5x week OT Frequency: 3x week            Contractures Contractures Info: Not present    Additional Factors Info  Code Status,Allergies Code Status Info: Full Allergies Info: Diazepam, Penicillins, Rofecoxib, Latex, Prozac, Zinc Oxide           Current Medications (08/19/2020):  This is the current hospital active medication list Current Facility-Administered Medications  Medication Dose Route Frequency Provider Last Rate Last Admin  . 0.9 %  sodium chloride infusion   Intravenous Continuous Emokpae, Courage, MD 75 mL/hr at 08/19/20 0600 Infusion Verify at 08/19/20 0600  . ALPRAZolam Duanne Moron) tablet 0.5 mg  0.5 mg Oral BID PRN Roxan Hockey, MD   0.5 mg at 08/18/20 2000  . Chlorhexidine Gluconate Cloth 2 % PADS 6 each  6 each Topical Daily Heath Lark D, DO   6 each at 08/19/20 [phone]  . fentaNYL (SUBLIMAZE) injection 25 mcg  25 mcg Intravenous Q2H PRN Roxan Hockey, MD   25 mcg at 08/19/20 0500  . methocarbamol (ROBAXIN) tablet 500 mg  500 mg Oral TID Roxan Hockey, MD   500 mg at 08/19/20 0921  . oxyCODONE (Oxy IR/ROXICODONE) immediate release tablet 5 mg  5 mg Oral Q4H PRN Denton Brick, Courage, MD   5 mg at 08/19/20 1351  . pantoprazole (PROTONIX) injection 40 mg  40 mg Intravenous Q24H Adefeso, Oladapo, DO   40 mg at 08/18/20 2315  . sertraline (ZOLOFT) tablet 25 mg  25  mg Oral Daily Roxan Hockey, MD   25 mg at 08/19/20 5366     Discharge Medications: Please see discharge summary for a list of discharge medications.  Relevant Imaging Results:  Relevant Lab Results:   Additional Information SSN: [place] , [place]

## 2020-08-19 NOTE — Progress Notes (Signed)
Report given to Kathyrn Drown, RN at transfer facility via telephone. Patient A&O x4. Vital signs WNL. No apparent signs of distress noted. Patient transported from [facility] ICU 01 to [place] [facility] [place] 28C-01 via carelink at this time.

## 2020-08-19 NOTE — Care Management Important Message (Signed)
Important Message  Patient Details  Name: Morgan Dixon MRN: [id number] Date of Birth: 11-19-1950   Medicare Important Message Given:  Yes     Tommy Medal 08/19/2020, 11:59 AM

## 2020-08-19 NOTE — Consult Note (Signed)
WOC Nurse Consult Note: Patient receiving care in [facility]. Chart and image reviewed in completing this consult Reason for Consult: abdominal wound Wound type: unclear etiolgy Pressure Injury POA: Yes/No/NA Measurement: To be provided by the bedside RN in the flowsheet section Wound bed: pink Drainage (amount, consistency, odor) none  Periwound: intact Dressing procedure/placement/frequency: Twice daily  place a small piece of saline moistened gauze into the abdominal wound, cover with dry gauze, tape in place. [facility] nurse will not follow at this time.  Please re-consult the [facility] team if needed.  Val Riles, RN, MSN, CWOCN, CNS-BC, pager [phone]

## 2020-08-20 ENCOUNTER — Inpatient Hospital Stay (HOSPITAL_COMMUNITY): Payer: Medicare HMO

## 2020-08-20 DIAGNOSIS — N390 Urinary tract infection, site not specified: Secondary | ICD-10-CM | POA: Diagnosis not present

## 2020-08-20 DIAGNOSIS — I639 Cerebral infarction, unspecified: Secondary | ICD-10-CM

## 2020-08-20 LAB — COMPREHENSIVE METABOLIC PANEL
ALT: 23 U/L (ref 0–44)
AST: 44 U/L — ABNORMAL HIGH (ref 15–41)
Albumin: 2.7 g/dL — ABNORMAL LOW (ref 3.5–5.0)
Alkaline Phosphatase: 48 U/L (ref 38–126)
Anion gap: 4 — ABNORMAL LOW (ref 5–15)
BUN: <5 mg/dL — ABNORMAL LOW (ref 8–23)
CO2: 24 mmol/L (ref 22–32)
Calcium: 8.4 mg/dL — ABNORMAL LOW (ref 8.9–10.3)
Chloride: 109 mmol/L (ref 98–111)
Creatinine, Ser: 0.78 mg/dL (ref 0.44–1.00)
GFR, Estimated: >60 mL/min (ref >60)
Glucose, Bld: 97 mg/dL (ref 70–99)
Potassium: 3.6 mmol/L (ref 3.5–5.1)
Sodium: 137 mmol/L (ref 135–145)
Total Bilirubin: 1.4 mg/dL — ABNORMAL HIGH (ref 0.3–1.2)
Total Protein: 5.3 g/dL — ABNORMAL LOW (ref 6.5–8.1)

## 2020-08-20 LAB — CBC
HCT: 31.4 % — ABNORMAL LOW (ref 36.0–46.0)
Hemoglobin: 10.4 g/dL — ABNORMAL LOW (ref 12.0–15.0)
MCH: 29.3 pg (ref 26.0–34.0)
MCHC: 33.1 g/dL (ref 30.0–36.0)
MCV: 88.5 fL (ref 80.0–100.0)
Platelets: 137 10*3/uL — ABNORMAL LOW (ref 150–400)
RBC: 3.55 MIL/uL — ABNORMAL LOW (ref 3.87–5.11)
RDW: 15 % (ref 11.5–15.5)
WBC: 3.5 10*3/uL — ABNORMAL LOW (ref 4.0–10.5)
nRBC: 0 % (ref 0.0–0.2)

## 2020-08-20 LAB — GLUCOSE, CAPILLARY
Glucose-Capillary: 100 mg/dL — ABNORMAL HIGH (ref 70–99)
Glucose-Capillary: 109 mg/dL — ABNORMAL HIGH (ref 70–99)

## 2020-08-20 MED ORDER — ASPIRIN 300 MG RE SUPP
300.0000 mg | Freq: Every day | RECTAL | Status: DC
  Filled 2020-08-20: qty 1

## 2020-08-20 MED ORDER — ASPIRIN 325 MG PO TABS
325.0000 mg | ORAL_TABLET | Freq: Every day | ORAL | Status: DC
  Administered 2020-08-21 – 2020-08-26 (×6): 325 mg via ORAL
  Filled 2020-08-20 (×6): qty 1

## 2020-08-20 MED ORDER — IOHEXOL 300 MG/ML  SOLN
75.0000 mL | Freq: Once | INTRAMUSCULAR | Status: AC | PRN
  Administered 2020-08-20: 75 mL via INTRAVENOUS

## 2020-08-20 MED ORDER — IOHEXOL 350 MG/ML SOLN
75.0000 mL | Freq: Once | INTRAVENOUS | Status: AC | PRN
  Administered 2020-08-20: 75 mL via INTRAVENOUS

## 2020-08-20 MED ORDER — STROKE: EARLY STAGES OF RECOVERY BOOK
Freq: Once | Status: AC
  Filled 2020-08-20: qty 1

## 2020-08-20 NOTE — Progress Notes (Signed)
Patient ID: Morgan Dixon, female   DOB: 1950-11-21, 70 y.o.   MRN: [id number] BP (!) 153/70   Pulse 60   Temp 98.9 F (37.2 C) (Oral)   Resp 18   Ht 5\' 6"  (1.676 m)   Wt 99.8 kg   SpO2 98%   BMI 35.51 kg/m  I have seen the patient briefly. I spoke with her daughter via phone. Currently waiting for the CT then will be able to leave a full consult.

## 2020-08-20 NOTE — Progress Notes (Signed)
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 CXR reviewed with patient with leads remaining -Neurosurgery consulted. Plan for CT head with and without contrast, pending -noted to have word finding difficulties this AM  2)S/p Prior Billroth 2 procedure Now with Possible GI bleed--- -Heme positive stool on admission  -GI was consulted at [facility]. Per GI, since no overt GI bleeding was seen with stable hgb, official GI consult has been put on hold until neuro/neurosurgical work up has been completed  3)Left Renal Mass--  If patient cannot have MRI for further evaluation then, recommend follow-up CT the abdomen without and with contrast in 3-6 months to assess for stability.  4)Anterior abdominal wall wound--- please see photos in epic - anterior abdominal wall with clean base dime size open wound  , does Not look infected -Wound care consult  appreciated--- recommends Twice daily  place a small piece of saline moistened gauze into the abdominal wound, cover with dry gauze, tape in place. -Stable  5)Acute Anemia-- Hgb is down to 10.3 from 11.2 on admission after hydration, suspect some hemodilution effect -Hemoglobin usually above 12, hgb has remained stable thus far, reviewed trends -Please see #2 above  6)UTI ruled out--- low clinical index of suspicion,  -stopped rocephin  7)Chronic pain syndrome--previously had a neurostimulator, patient sees pain clinic at [facility] is negative -Hold Lyrica, hold Nucynta, hold Neurontin, -Continue methocarbamol ,stop Zanaflex -Okay to use iv fentanyl as needed for severe pain and oxycodone as needed for moderate pain  8)Acute metabolic encephalopathy--suspect secondary to #1 above  9)Depression and Anxiety--okay to continue Zoloft, may use Xanax as needed  10) social/ethics--Full Code noted  DVT prophylaxis: SCD's Code Status: Full Family Communication: Pt in room, family not at bedside  Status is: Inpatient  Remains inpatient appropriate because:Inpatient level of care appropriate due to severity of illness   Dispo: The patient is from: Home              Anticipated d/c is to: Home              Patient currently is not medically stable to d/c.   Difficult to place patient No       Consultants:   GI  Neurosurgery  Procedures:     Antimicrobials: Anti-infectives (From admission, onward)   Start     Dose/Rate Route Frequency Ordered Stop   08/17/20 2130  cefTRIAXone (ROCEPHIN) 1 g in sodium chloride 0.9 % 100 mL IVPB  Status:  Discontinued        1 g 200 mL/hr over 30 Minutes Intravenous Every 24 hours 08/17/20 2115 08/19/20 0955       Subjective: Feeling sad about her current diagnosis and tearful when talking about her deceased husband who recently died from lung cancer.  Objective: Vitals:   08/20/20 0348 08/20/20 0700 08/20/20 1208 08/20/20  1547  BP: (!) 165/74 (!) 168/70 (!) 154/72 (!) 153/70  Pulse: 65 (!) 57 (!) 55 60  Resp: 17 18    Temp: 98 F (36.7 C) 98.6 F (37 C) 98 F (36.7 C) 98.9 F (37.2 C)  TempSrc:  Oral Oral Oral  SpO2: 96% 96% 98% 98%  Weight:      Height:        Intake/Output Summary (Last 24 hours) at 08/20/2020 1657 Last data filed at 08/20/2020 1200 Gross per 24 hour  Intake 1552.43 ml  Output 900 ml  Net 652.43 ml   Filed Weights   08/17/20 1352 08/19/20 0328  Weight: 97.5 kg 99.8 kg    Examination: General exam: Awake, laying in bed, in nad Respiratory system: Normal respiratory effort, no wheezing Cardiovascular system: regular rate, s1, s2 Gastrointestinal system: Soft, nondistended, positive BS Central nervous system: CN2-12 grossly intact, strength intact, word finding difficulty Extremities: Perfused, no clubbing Skin: Normal skin turgor, no notable skin lesions seen Psychiatry: Mood normal // no visual hallucinations   Data Reviewed: I have personally reviewed following labs and imaging studies  CBC: Recent Labs  Lab 08/17/20 1407 08/18/20 0325 08/20/20 0145  WBC 4.6 4.3 3.5*  HGB 11.3* 10.3* 10.4*  HCT 34.1* 31.4* 31.4*  MCV 89.0 89.5 88.5  PLT 147* 141* 542*   Basic Metabolic Panel: Recent Labs  Lab 08/17/20 1407 08/18/20 0325 08/20/20 0145  NA 135 136 137  K 3.3* 3.2* 3.6  CL 97* 100 109  CO2 30 30 24   GLUCOSE 108* 97 97  BUN 13 9 <5*  CREATININE 0.73 0.74 0.78  CALCIUM 8.9 8.5* 8.4*  MG  --  1.8  --   PHOS  --  3.5  --    GFR: Estimated Creatinine Clearance: 79.1 mL/min (by C-G formula based on SCr of 0.78 mg/dL). Liver Function Tests: Recent Labs  Lab 08/17/20 1407 08/18/20 0325 08/20/20 0145  AST 76* 64* 44*  ALT 27 23 23   ALKPHOS 57 49 48  BILITOT 2.1* 2.1* 1.4*  PROT 6.6 5.7* 5.3*  ALBUMIN 3.4* 2.9* 2.7*   No results for input(s): LIPASE, AMYLASE in the last 168 hours. Recent Labs  Lab 08/17/20 1722  AMMONIA 29   Coagulation  Profile: Recent Labs  Lab 08/17/20 1407 08/18/20 0325  INR 1.1 1.1   Cardiac Enzymes: No results for input(s): CKTOTAL, CKMB, CKMBINDEX, TROPONINI in the last 168 hours. BNP (last 3 results) No results for input(s): PROBNP in the last 8760 hours. HbA1C: No results for input(s): HGBA1C in the last 72 hours. CBG: Recent Labs  Lab 08/19/20 1126 08/19/20 1728 08/19/20 2353 08/20/20 0606 08/20/20 1209  GLUCAP 126* 127* 122* 100* 109*   Lipid Profile: No results for input(s): CHOL, HDL, LDLCALC, TRIG, CHOLHDL, LDLDIRECT in the last 72 hours. Thyroid Function Tests: No results for input(s): TSH, T4TOTAL, FREET4, T3FREE, THYROIDAB in the last 72 hours. Anemia Panel: No results for input(s): VITAMINB12, FOLATE, FERRITIN, TIBC, IRON, RETICCTPCT in the last 72 hours. Sepsis Labs: Recent Labs  Lab 08/17/20 1407 08/17/20 1551  LATICACIDVEN 1.3 1.5    Recent  Results (from the past 240 hour(s))  Blood culture (routine x 2)     Status: None (Preliminary result)   Collection Time: 08/17/20  3:51 PM   Specimen: BLOOD RIGHT HAND  Result Value Ref Range Status   Specimen Description BLOOD RIGHT HAND  Final   Special Requests   Final    Blood Culture adequate volume BOTTLES DRAWN AEROBIC AND ANAEROBIC   Culture   Final    NO GROWTH 3 DAYS Performed at [facility], [address] [place], [place] [address]    Report Status PENDING  Incomplete  Blood culture (routine x 2)     Status: None (Preliminary result)   Collection Time: 08/17/20  3:51 PM   Specimen: BLOOD  Result Value Ref Range Status   Specimen Description BLOOD LEFT ANTECUBITAL  Final   Special Requests   Final    Blood Culture adequate volume BOTTLES DRAWN AEROBIC AND ANAEROBIC   Culture   Final    NO GROWTH 3 DAYS Performed at [facility], [address], [place], [place] [address]    Report Status PENDING  Incomplete  Urine culture     Status: None   Collection Time: 08/17/20  9:00 PM   Specimen: Urine, Random   Result Value Ref Range Status   Specimen Description   Final    URINE, RANDOM Performed at [facility], [address] [place], [place] [address]    Special Requests   Final    NONE Performed at [facility], [address] [place], [place] [address]    Culture   Final    NO GROWTH Performed at [facility], [place] [address] [place],  [address]    Report Status 08/19/2020 FINAL  Final  SARS CORONAVIRUS 2 (TAT 6-24 HRS) Nasopharyngeal Nasopharyngeal Swab     Status: None   Collection Time: 08/17/20  9:39 PM   Specimen: Nasopharyngeal Swab  Result Value Ref Range Status   SARS Coronavirus 2 NEGATIVE NEGATIVE Final    Comment: (NOTE) SARS-CoV-2 target nucleic acids are NOT DETECTED.  The SARS-CoV-2 RNA is generally detectable in upper and lower respiratory specimens during the acute phase of infection. Negative results do not preclude SARS-CoV-2 infection, do not rule out co-infections with other pathogens, and should not be used as the sole basis for treatment or other patient management decisions. Negative results must be combined with clinical observations, patient history, and epidemiological information. The expected result is Negative.  Fact Sheet for Patients: SugarRoll.be  Fact Sheet for Healthcare Providers: https://www.woods-mathews.com/  This test is not yet approved or cleared by the [place] FDA and  has been authorized for detection and/or diagnosis of SARS-CoV-2 by FDA under an Emergency Use Authorization (EUA). This EUA will remain  in effect (meaning this test can be used) for the duration of the COVID-19 declaration under Se ction 564(b)(1) of the Act, 21 U.S.C. section 360bbb-3(b)(1), unless the authorization is terminated or revoked sooner.  Performed at [facility], [place] [address], [place],  [address]   MRSA PCR Screening     Status: None   Collection Time: 08/19/20  9:30 AM    Specimen: Nasopharyngeal  Result Value Ref Range Status   MRSA by PCR NEGATIVE NEGATIVE Final    Comment:        The GeneXpert MRSA Assay (FDA approved for NASAL specimens only), is one component of a comprehensive MRSA colonization surveillance program. It is not intended to diagnose MRSA infection  nor to guide or monitor treatment for MRSA infections. Performed at [facility], [address] [place], [place] [address]      Radiology Studies: DG CHEST PORT 1 VIEW  Result Date: 08/20/2020 CLINICAL DATA:  Dyspnea EXAM: PORTABLE CHEST 1 VIEW COMPARISON:  08/17/2020 chest radiograph. FINDINGS: Inferior approach spinal stimulator leads terminate over the the lower thoracic spinal canal. Surgical clips in the upper abdomen bilaterally. Stable cardiomediastinal silhouette with normal heart size. No pneumothorax. No pleural effusion. Lungs appear clear, with no acute consolidative airspace disease and no pulmonary edema. IMPRESSION: No active disease. Electronically Signed   By: Ilona Sorrel M.D.   On: 08/20/2020 09:10    Scheduled Meds: . amLODipine  5 mg Oral Daily  . Chlorhexidine Gluconate Cloth  6 each Topical Daily  . methocarbamol  500 mg Oral TID  . pantoprazole (PROTONIX) IV  40 mg Intravenous Q24H  . sertraline  25 mg Oral Daily   Continuous Infusions: . sodium chloride Stopped (08/20/20 1139)     LOS: 3 days   Marylu Lund, MD [facility] Pager On Amion  If 7PM-7AM, please contact night-coverage 08/20/2020, 4:57 PM

## 2020-08-20 NOTE — Progress Notes (Signed)
Patient ID: Morgan Dixon, female   DOB: 1950-08-22, 70 y.o.   MRN: [id number] Given that there is no mass, there is no need for a consultation. May consult neurology for mental status changes.  Thank you.  Films reviewed, and have spoken with the neuroradiologist.

## 2020-08-20 NOTE — Consult Note (Signed)
Neurology Consultation  Reason for Consult: Altered mental status Referring Physician: Dr. Marylu Lund, [facility]  CC: Altered mental status  History is obtained from: Chart, patient  HPI: Morgan Dixon is a 70 y.o. female past medical history of chronic back pain/chronic pain, hypertension, status post prior gastric bypass surgery, admitted to [facility] on 08/17/2020 for concerns for altered mental status-admitted for work-up for acute metabolic encephalopathy with head imaging in the form of CT head reporting a left parietal temporal lobe hypodensity concerning for mass as well as a left renal mass.  Further imaging of the head with CT head with contrast revealed a possible subacute stroke rather than a mass in the left parietal area. Patient is not able to undergo an MRI due to a spinal stimulator. She is unable to tell me when her last known well clearly was but it was clearly before 08/17/2020 when she presented to the hospital. She says she has been having a lot of trouble bringing out words and completing sentences.  She says she knows what she wants to say but the words do not come out right all the time. Denies any more than usual pain at this time.  Denies headaches.  Denies visual changes.   LKW: Sometime before 08/17/2020 tpa given?: no, outside the window Premorbid modified Rankin scale (mRS): Unable to clearly ascertain but lives at home independently so I would imagine it is a 0 or 1.  No family at bedside at this time to confirm  ROS: Full ROS was performed and is negative except as noted in the HPI.  Past Medical History:  Diagnosis Date  . Chronic pain   . Edema extremities     Family History  Problem Relation Age of Onset  . Breast cancer Maternal Aunt 60    Social History:   reports that she has never smoked. She has never used smokeless tobacco. She reports that she does not drink alcohol and does not use drugs.  Medications  Current  Facility-Administered Medications:  .  0.9 %  sodium chloride infusion, , Intravenous, Continuous, Emokpae, Courage, MD, Last Rate: 75 mL/hr at 08/20/20 1947, New Bag at 08/20/20 1947 .  ALPRAZolam Duanne Moron) tablet 0.5 mg, 0.5 mg, Oral, BID PRN, Denton Brick, Courage, MD, 0.5 mg at 08/18/20 2000 .  amLODipine (NORVASC) tablet 5 mg, 5 mg, Oral, Daily, Emokpae, Courage, MD, 5 mg at 08/20/20 1030 .  Chlorhexidine Gluconate Cloth 2 % PADS 6 each, 6 each, Topical, Daily, Heath Lark D, DO, 6 each at 08/20/20 1830 .  fentaNYL (SUBLIMAZE) injection 25 mcg, 25 mcg, Intravenous, Q2H PRN, Denton Brick, Courage, MD, 25 mcg at 08/20/20 2118 .  hydrALAZINE (APRESOLINE) injection 10 mg, 10 mg, Intravenous, Q6H PRN, Emokpae, Courage, MD .  methocarbamol (ROBAXIN) tablet 500 mg, 500 mg, Oral, TID, Emokpae, Courage, MD, 500 mg at 08/20/20 2118 .  oxyCODONE (Oxy IR/ROXICODONE) immediate release tablet 5 mg, 5 mg, Oral, Q4H PRN, Denton Brick, Courage, MD, 5 mg at 08/20/20 1901 .  pantoprazole (PROTONIX) injection 40 mg, 40 mg, Intravenous, Q24H, Adefeso, Oladapo, DO, 40 mg at 08/20/20 0015 .  sertraline (ZOLOFT) tablet 25 mg, 25 mg, Oral, Daily, Emokpae, Courage, MD, 25 mg at 08/20/20 1029  Exam: Current vital signs: BP (!) 164/65 (BP Location: Left Arm)   Pulse 62   Temp 98.8 F (37.1 C) (Oral)   Resp 19   Ht 5\' 6"  (1.676 m)   Wt 99.8 kg   SpO2 98%   BMI  35.51 kg/m  Vital signs in last 24 hours: Temp:  [98 F (36.7 C)-99.2 F (37.3 C)] 98.8 F (37.1 C) (05/07 2022) Pulse Rate:  [55-65] 62 (05/07 2022) Resp:  [17-20] 19 (05/07 2022) BP: (148-168)/(65-74) 164/65 (05/07 2022) SpO2:  [96 %-98 %] 98 % (05/07 2022) General: Awake alert in no distress HEENT: Normocephalic atraumatic, dry oral mucous membranes, no thyromegaly Lungs: Clear to auscultation with no wheezing Cardiovascular: Regular rate rhythm with no murmurs Abdomen, obese, nontender Extremities warm well perfused with trace edema Neurological exam Awake  alert oriented x3 There is no dysarthria present and her speech Her naming is intact, comprehension is intact, repetition is intact but she has obvious word finding difficulties while trying to answer questions or trying to tell me certain facts-such as when I asked her why she cannot get an MRI, she started saying "I have a------------, Lamont Snowball I am drawing a blank---" I had to say stimulator before she could come up with the word stimulator.  So she has more of expressive aphasia than receptive aphasia on my examination currently. Cranial nerves: Pupils equal round react light, extraocular movements intact, visual fields full, face appears symmetric, facial sensation intact, tongue and palate midline. Motor exam: She has no drift in any of the 4 extremities-symmetric 5/5 all over with some pain component precluding a good exam. Sensory exam: Diminished sensation on the right leg compared to the left and she extinguishes the right side on double 17 stimulation. Coordination: No obvious dysmetria Gait testing deferred NIH SS 1a Level of Conscious.: 0 1b LOC Questions: 0 1c LOC Commands: 0 2 Best Gaze: 0 3 Visual: 0 4 Facial Palsy: 0 5a Motor Arm - left: 0 5b Motor Arm - Right: 0 6a Motor Leg - Left: 0 6b Motor Leg - Right: 0 7 Limb Ataxia: 0 8 Sensory: 1 9 Best Language: 1 10 Dysarthria: 0 11 Extinct. and Inatten.: 1 TOTAL: 3   Labs I have reviewed labs in epic and the results pertinent to this consultation are: CBC    Component Value Date/Time   WBC 3.5 (L) 08/20/2020 0145   RBC 3.55 (L) 08/20/2020 0145   HGB 10.4 (L) 08/20/2020 0145   HGB 12.9 12/11/2012 1744   HCT 31.4 (L) 08/20/2020 0145   HCT 36.8 12/11/2012 1744   PLT 137 (L) 08/20/2020 0145   PLT 196 12/11/2012 1744   MCV 88.5 08/20/2020 0145   MCV 90 12/11/2012 1744   MCH 29.3 08/20/2020 0145   MCHC 33.1 08/20/2020 0145   RDW 15.0 08/20/2020 0145   RDW 13.7 12/11/2012 1744   LYMPHSABS 1.1 05/24/2020 1717   MONOABS  0.2 05/24/2020 1717   EOSABS 0.0 05/24/2020 1717   BASOSABS 0.0 05/24/2020 1717    CMP     Component Value Date/Time   NA 137 08/20/2020 0145   NA 136 12/11/2012 1744   K 3.6 08/20/2020 0145   K 4.0 12/11/2012 1744   CL 109 08/20/2020 0145   CL 102 12/11/2012 1744   CO2 24 08/20/2020 0145   CO2 31 12/11/2012 1744   GLUCOSE 97 08/20/2020 0145   GLUCOSE 121 (H) 12/11/2012 1744   BUN <5 (L) 08/20/2020 0145   BUN 13 12/11/2012 1744   CREATININE 0.78 08/20/2020 0145   CREATININE 0.70 12/11/2012 1744   CALCIUM 8.4 (L) 08/20/2020 0145   CALCIUM 9.3 12/11/2012 1744   PROT 5.3 (L) 08/20/2020 0145   ALBUMIN 2.7 (L) 08/20/2020 0145   AST 44 (H) 08/20/2020  0145   ALT 23 08/20/2020 0145   ALKPHOS 48 08/20/2020 0145   BILITOT 1.4 (H) 08/20/2020 0145   GFRNONAA >60 08/20/2020 0145   GFRNONAA >60 12/11/2012 1744   GFRAA >60 12/11/2012 1744    Imaging I have reviewed the images obtained:  CT-scan of the brain- with and without contrast on 08/20/2020 and comparison from 3 days ago shows stable appearance of the cortical and subcortical hypoattenuation in the left posterior frontal and parietal lobe.  There is some enhancement in the cortex without a discrete mass lesion.  Neoplasm is less likely.  There is concern for a venous infarct versus a subacute arterial infarct.  MRI follow-up was recommended but patient has a spinal cord stimulator that is not compatible per verbal report.  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 Needs full stroke work-up.  Impression: Left frontoparietal hypodensity on CT concerning for subacute infarct-radiology concern for a venous infarct, could be an arterial infarct of subacute nature as well. Etiology of the stroke currently under  investigation  Recommendations: Telemetry Frequent neurochecks CTA head and neck 2D echo A1c next lipid panel Aspirin 325 p.o. daily Atorvastatin 80 p.o. daily PT/OT/speech therapy No need for permissive hypertension N.p.o. until cleared by bedside swallow screen or formal swallow evaluation Might need long-term cardiac monitoring to evaluate for embolic source if no arrhythmias noted on telemetry. Plan was briefly discussed with the hospitalist and the daytime neurologist. Stroke team will continue to follow.  -- Amie Portland, MD Neurologist [facility] Pager: [phone]

## 2020-08-20 NOTE — Plan of Care (Signed)
  Problem: Education: Goal: Knowledge of General Education information will improve Description Including pain rating scale, medication(s)/side effects and non-pharmacologic comfort measures Outcome: Progressing   Problem: Health Behavior/Discharge Planning: Goal: Ability to manage health-related needs will improve Outcome: Progressing   Problem: Clinical Measurements: Goal: Ability to maintain clinical measurements within normal limits will improve Outcome: Progressing Goal: Will remain free from infection Outcome: Progressing Goal: Diagnostic test results will improve Outcome: Progressing Goal: Respiratory complications will improve Outcome: Progressing Goal: Cardiovascular complication will be avoided Outcome: Progressing   Problem: Activity: Goal: Risk for activity intolerance will decrease Outcome: Progressing   Problem: Nutrition: Goal: Adequate nutrition will be maintained Outcome: Progressing   Problem: Elimination: Goal: Will not experience complications related to bowel motility Outcome: Progressing Goal: Will not experience complications related to urinary retention Outcome: Progressing   Problem: Pain Managment: Goal: General experience of comfort will improve Outcome: Progressing   Problem: Safety: Goal: Ability to remain free from injury will improve Outcome: Progressing   

## 2020-08-20 NOTE — Progress Notes (Signed)
Patient ID: Morgan Dixon, female   DOB: 1950-12-23, 70 y.o.   MRN: [id number] Please obtain CT with and without contrast since MRI not possible.

## 2020-08-20 NOTE — Progress Notes (Signed)
Patient arrived to unit [facility] bed 28 via care link. Assisted patient to bed by nursing staff.Patient alert and oriented x 4 at present time.Oriented patient to nursing unit and call bell system.No acute distress noted at present time. Call bell,phone ,personal items within reach,and bed alarm set for safety.

## 2020-08-21 ENCOUNTER — Inpatient Hospital Stay (HOSPITAL_COMMUNITY): Payer: Medicare HMO

## 2020-08-21 DIAGNOSIS — R011 Cardiac murmur, unspecified: Secondary | ICD-10-CM | POA: Diagnosis not present

## 2020-08-21 DIAGNOSIS — R4182 Altered mental status, unspecified: Secondary | ICD-10-CM | POA: Diagnosis not present

## 2020-08-21 DIAGNOSIS — N390 Urinary tract infection, site not specified: Secondary | ICD-10-CM | POA: Diagnosis not present

## 2020-08-21 DIAGNOSIS — G939 Disorder of brain, unspecified: Secondary | ICD-10-CM | POA: Diagnosis not present

## 2020-08-21 LAB — COMPREHENSIVE METABOLIC PANEL
ALT: 20 U/L (ref 0–44)
AST: 32 U/L (ref 15–41)
Albumin: 2.7 g/dL — ABNORMAL LOW (ref 3.5–5.0)
Alkaline Phosphatase: 48 U/L (ref 38–126)
Anion gap: 7 (ref 5–15)
BUN: 5 mg/dL — ABNORMAL LOW (ref 8–23)
CO2: 25 mmol/L (ref 22–32)
Calcium: 8.7 mg/dL — ABNORMAL LOW (ref 8.9–10.3)
Chloride: 105 mmol/L (ref 98–111)
Creatinine, Ser: 0.7 mg/dL (ref 0.44–1.00)
GFR, Estimated: >60 mL/min (ref >60)
Glucose, Bld: 98 mg/dL (ref 70–99)
Potassium: 3.8 mmol/L (ref 3.5–5.1)
Sodium: 137 mmol/L (ref 135–145)
Total Bilirubin: 0.9 mg/dL (ref 0.3–1.2)
Total Protein: 5.6 g/dL — ABNORMAL LOW (ref 6.5–8.1)

## 2020-08-21 LAB — LIPID PANEL
Cholesterol: 162 mg/dL (ref 0–200)
HDL: 58 mg/dL (ref >40)
LDL Cholesterol: 88 mg/dL (ref 0–99)
Total CHOL/HDL Ratio: 2.8 RATIO
Triglycerides: 81 mg/dL (ref <150)
VLDL: 16 mg/dL (ref 0–40)

## 2020-08-21 LAB — ECHOCARDIOGRAM COMPLETE
Area-P 1/2: 2.95 cm2
Height: 66 in
S' Lateral: 2.1 cm
Weight: 3520.31 oz

## 2020-08-21 LAB — GLUCOSE, CAPILLARY
Glucose-Capillary: 106 mg/dL — ABNORMAL HIGH (ref 70–99)
Glucose-Capillary: 111 mg/dL — ABNORMAL HIGH (ref 70–99)
Glucose-Capillary: 112 mg/dL — ABNORMAL HIGH (ref 70–99)
Glucose-Capillary: 128 mg/dL — ABNORMAL HIGH (ref 70–99)

## 2020-08-21 LAB — CBC
HCT: 32.1 % — ABNORMAL LOW (ref 36.0–46.0)
Hemoglobin: 10.5 g/dL — ABNORMAL LOW (ref 12.0–15.0)
MCH: 29.1 pg (ref 26.0–34.0)
MCHC: 32.7 g/dL (ref 30.0–36.0)
MCV: 88.9 fL (ref 80.0–100.0)
Platelets: 151 10*3/uL (ref 150–400)
RBC: 3.61 MIL/uL — ABNORMAL LOW (ref 3.87–5.11)
RDW: 15.1 % (ref 11.5–15.5)
WBC: 3.7 10*3/uL — ABNORMAL LOW (ref 4.0–10.5)
nRBC: 0 % (ref 0.0–0.2)

## 2020-08-21 LAB — HEMOGLOBIN A1C
Hgb A1c MFr Bld: 4.9 % (ref 4.8–5.6)
Mean Plasma Glucose: 93.93 mg/dL

## 2020-08-21 MED ORDER — CLOPIDOGREL BISULFATE 75 MG PO TABS
75.0000 mg | ORAL_TABLET | Freq: Every day | ORAL | Status: DC
  Administered 2020-08-22 – 2020-09-01 (×11): 75 mg via ORAL
  Filled 2020-08-21 (×11): qty 1

## 2020-08-21 NOTE — Progress Notes (Signed)
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 Active Problems:   Abdominal pain   Altered mental status   Hypokalemia   GI bleed   Elevated AST (SGOT)   Elevated troponin I level   Left renal mass   Acute metabolic encephalopathy  1)Brain Lesion ruled out with L parietal subacute infarct ruled in -Initial concern was for possible L brain mass on non-contrast CT head -Pt was transferred to [facility] for formal Neurosurgical eval -CT head with contrast performed which ruled out mass, but demonstrated L parietal subacute venous infarct vs possible subacute arterial infarct -Neurology has since been consulted -2d echo reviewed, appears unremarkable -Continues to have word-finding difficulties, but understands language without issue -SNF has been recommended by therapy, TOC has been consulted  2)S/p Prior Billroth 2 procedure Now with Possible GI bleed--- -Heme positive stool on admission  -GI was consulted at [facility]. Per GI, since no overt GI bleeding was seen with stable hgb, official GI consult has been put on hold until neuro/neurosurgical work up has been completed -Hgb has remained stable  3)Left Renal Mass--  If patient cannot have MRI for further evaluation then, recommend  follow-up CT the abdomen without and with contrast in 3-6 months to assess for stability.  4)Anterior abdominal wall wound--- please see photos in epic - anterior abdominal wall with clean base dime size open wound  , does Not look infected -Wound care consult appreciated--- recommends Twice daily  place a small piece of saline moistened gauze into the abdominal wound, cover with dry gauze, tape in place. -Remains stable  5)Acute Anemia-- Hgb is down to 10.3 from 11.2 on admission after hydration, suspect some hemodilution effect -Hemoglobin usually above 12, hgb has remained stable thus far, reviewed trends -Please see #2 above  6)UTI ruled out--- low clinical index of suspicion,  -stopped rocephin -Remains stable  7)Chronic pain syndrome--previously had a neurostimulator, patient sees pain clinic at [place] is negative -Hold Lyrica, hold Nucynta, hold Neurontin, -Continue methocarbamol ,stop Zanaflex -Okay to use iv fentanyl as needed for severe pain and oxycodone as needed for moderate pain  8)Acute metabolic encephalopathy--suspect secondary to #1 above  9)Depression and Anxiety--okay to continue Zoloft, may use Xanax as needed  10) social/ethics--Full Code noted  DVT prophylaxis: SCD's Code Status: Full Family Communication: Pt in room, family not at bedside  Status is: Inpatient  Remains inpatient appropriate because:Inpatient level of care appropriate due to severity of illness   Dispo: The patient is from: Home              Anticipated d/c is to: SNF              Patient currently is not medically stable to d/c.   Difficult to place patient No       Consultants:   GI  Neurosurgery  Neurology  Procedures:     Antimicrobials: Anti-infectives (From admission, onward)   Start     Dose/Rate Route Frequency Ordered Stop   08/17/20 2130  cefTRIAXone (ROCEPHIN) 1 g in sodium chloride 0.9 % 100 mL IVPB  Status:  Discontinued        1 g 200 mL/hr  over 30 Minutes Intravenous Every 24 hours 08/17/20 2115 08/19/20 0955      Subjective: Feeling relieved that a brain mass was ruled out. Still having some frustration with inability to get certain words out  Objective: Vitals:   08/21/20 0600 08/21/20 0820 08/21/20 1239 08/21/20 1556  BP: (!) 154/62 (!) 162/94 (!) 146/61 (!) 153/78  Pulse: (!) 58 61 (!) 56 70  Resp: 18 18    Temp: 97.9 F (36.6 C) 98.5 F (36.9 C) 98.6 F (37 C) 98.8 F (37.1 C)  TempSrc: Oral Oral Oral Oral  SpO2: 97% 98% 97% 100%  Weight:      Height:        Intake/Output Summary (Last 24 hours) at 08/21/2020 1729 Last data filed at 08/21/2020 1557 Gross per 24 hour  Intake 1187.04 ml  Output 2150 ml  Net -962.96 ml   Filed Weights   08/17/20 1352 08/19/20 0328  Weight: 97.5 kg 99.8 kg    Examination: General exam: Conversant, in no acute distress Respiratory system: normal chest rise, clear, no audible wheezing Cardiovascular system: regular rhythm, s1-s2 Gastrointestinal system: Nondistended, nontender, pos BS Central nervous system: No seizures, no tremors Extremities: No cyanosis, no joint deformities Skin: No rashes, no pallor Psychiatry: Affect normal // no auditory hallucinations   Data Reviewed: I have personally reviewed following labs and imaging studies  CBC: Recent Labs  Lab 08/17/20 1407 08/18/20 0325 08/20/20 0145 08/21/20 0238  WBC 4.6 4.3 3.5* 3.7*  HGB 11.3* 10.3* 10.4* 10.5*  HCT 34.1* 31.4* 31.4* 32.1*  MCV 89.0 89.5 88.5 88.9  PLT 147* 141* 137* [id number]   Basic Metabolic Panel: Recent Labs  Lab 08/17/20 1407 08/18/20 0325 08/20/20 0145 08/21/20 0238  NA 135 136 137 137  K 3.3* 3.2* 3.6 3.8  CL 97* 100 109 105  CO2 30 30 24 25   GLUCOSE 108* 97 97 98  BUN 13 9 <5* 5*  CREATININE 0.73 0.74 0.78 0.70  CALCIUM 8.9 8.5* 8.4* 8.7*  MG  --  1.8  --   --   PHOS  --  3.5  --   --    GFR: Estimated Creatinine Clearance: 79.1 mL/min (by C-G formula based on SCr of 0.7  mg/dL). Liver Function Tests: Recent Labs  Lab 08/17/20 1407 08/18/20 0325 08/20/20 0145 08/21/20 0238  AST 76* 64* 44* 32  ALT 27 23 23 20   ALKPHOS 57 49 48 48  BILITOT 2.1* 2.1* 1.4* 0.9  PROT 6.6 5.7* 5.3* 5.6*  ALBUMIN 3.4* 2.9* 2.7* 2.7*   No results for input(s): LIPASE, AMYLASE in the last 168 hours. Recent Labs  Lab 08/17/20 1722  AMMONIA 29   Coagulation Profile: Recent Labs  Lab 08/17/20 1407 08/18/20 0325  INR 1.1 1.1   Cardiac Enzymes: No results for input(s): CKTOTAL, CKMB, CKMBINDEX, TROPONINI in the last 168 hours. BNP (last 3 results) No results for input(s): PROBNP in the last 8760 hours. HbA1C: Recent Labs    08/21/20 0238  HGBA1C 4.9   CBG: Recent Labs  Lab 08/20/20 0606 08/20/20 1209 08/21/20 0057 08/21/20 0614 08/21/20 1236  GLUCAP 100* 109* 111* 106* 112*  Lipid Profile: Recent Labs    08/21/20 0238  CHOL 162  HDL 58  LDLCALC 88  TRIG 81  CHOLHDL 2.8   Thyroid Function Tests: No results for input(s): TSH, T4TOTAL, FREET4, T3FREE, THYROIDAB in the last 72 hours. Anemia Panel: No results for input(s): VITAMINB12, FOLATE, FERRITIN, TIBC, IRON, RETICCTPCT in the last 72 hours. Sepsis Labs: Recent Labs  Lab 08/17/20 1407 08/17/20 1551  LATICACIDVEN 1.3 1.5    Recent Results (from the past 240 hour(s))  Blood culture (routine x 2)     Status: None (Preliminary result)   Collection Time: 08/17/20  3:51 PM   Specimen: BLOOD RIGHT HAND  Result Value Ref Range Status   Specimen Description BLOOD RIGHT HAND  Final   Special Requests   Final    Blood Culture adequate volume BOTTLES DRAWN AEROBIC AND ANAEROBIC   Culture   Final    NO GROWTH 4 DAYS Performed at [facility], [address] [place], [place] [address]    Report Status PENDING  Incomplete  Blood culture (routine x 2)     Status: None (Preliminary result)   Collection Time: 08/17/20  3:51 PM   Specimen: BLOOD  Result Value Ref Range Status   Specimen  Description BLOOD LEFT ANTECUBITAL  Final   Special Requests   Final    Blood Culture adequate volume BOTTLES DRAWN AEROBIC AND ANAEROBIC   Culture   Final    NO GROWTH 4 DAYS Performed at [facility], [address] [place], [place] [address]    Report Status PENDING  Incomplete  Urine culture     Status: None   Collection Time: 08/17/20  9:00 PM   Specimen: Urine, Random  Result Value Ref Range Status   Specimen Description   Final    URINE, RANDOM Performed at [facility], [address] [place], [place] [address]    Special Requests   Final    NONE Performed at [facility], [address], [place], [place] [address]    Culture   Final    NO GROWTH Performed at [facility], [place] [address], [place], [place] [address]    Report Status 08/19/2020 FINAL  Final  SARS CORONAVIRUS 2 (TAT 6-24 HRS) Nasopharyngeal Nasopharyngeal Swab     Status: None   Collection Time: 08/17/20  9:39 PM   Specimen: Nasopharyngeal Swab  Result Value Ref Range Status   SARS Coronavirus 2 NEGATIVE NEGATIVE Final    Comment: (NOTE) SARS-CoV-2 target nucleic acids are NOT DETECTED.  The SARS-CoV-2 RNA is generally detectable in upper and lower respiratory specimens during the acute phase of infection. Negative results do not preclude SARS-CoV-2 infection, do not rule out co-infections with other pathogens, and should not be used as the sole basis for treatment or other patient management decisions. Negative results must be combined with clinical observations, patient history, and epidemiological information. The expected result is Negative.  Fact Sheet for Patients: SugarRoll.be  Fact Sheet for Healthcare Providers: https://www.woods-mathews.com/  This test is not yet approved or cleared by the [place] FDA and  has been authorized for detection and/or diagnosis of SARS-CoV-2 by FDA under an Emergency Use Authorization (EUA). This EUA will  remain  in effect (meaning this test can be used) for the duration of the COVID-19 declaration under Se ction 564(b)(1) of the Act, 21 U.S.C. section 360bbb-3(b)(1), unless the authorization is terminated or revoked sooner.  Performed at [facility], [place],  [place] [address]   MRSA PCR Screening     Status: None   Collection Time: 08/19/20  9:30 AM   Specimen: Nasopharyngeal  Result Value Ref Range Status   MRSA by PCR NEGATIVE NEGATIVE Final    Comment:        The GeneXpert MRSA Assay (FDA approved for NASAL specimens only), is one component of a comprehensive MRSA colonization surveillance program. It is not intended to diagnose MRSA infection nor to guide or monitor treatment for MRSA infections. Performed at [facility], [address] [place], [place] [address]      Radiology Studies: CT ANGIO HEAD W OR WO CONTRAST  Result Date: 08/20/2020 CLINICAL DATA:  Ataxia EXAM: CT ANGIOGRAPHY HEAD AND NECK TECHNIQUE: Multidetector CT imaging of the head and neck was performed using the standard protocol during bolus administration of intravenous contrast. Multiplanar CT image reconstructions and MIPs were obtained to evaluate the vascular anatomy. Carotid stenosis measurements (when applicable) are obtained utilizing NASCET criteria, using the distal internal carotid diameter as the denominator. CONTRAST:  62mL OMNIPAQUE IOHEXOL 350 MG/ML SOLN COMPARISON:  None. FINDINGS: CTA NECK FINDINGS SKELETON: There is no bony spinal canal stenosis. No lytic or blastic lesion. OTHER NECK: Normal pharynx, larynx and major salivary glands. No cervical lymphadenopathy. Unremarkable thyroid gland. UPPER CHEST: No pneumothorax or pleural effusion. No nodules or masses. AORTIC ARCH: There is no calcific atherosclerosis of the aortic arch. There is no aneurysm, dissection or hemodynamically significant stenosis of the visualized portion of the aorta. Conventional 3 vessel aortic  branching pattern. The visualized proximal subclavian arteries are widely patent. RIGHT CAROTID SYSTEM: Normal without aneurysm, dissection or stenosis. LEFT CAROTID SYSTEM: Normal without aneurysm, dissection or stenosis. VERTEBRAL ARTERIES: Left dominant configuration. Both origins are clearly patent. There is no dissection, occlusion or flow-limiting stenosis to the skull base (V1-V3 segments). CTA HEAD FINDINGS POSTERIOR CIRCULATION: --Vertebral arteries: Normal V4 segments. --Inferior cerebellar arteries: Normal. --Basilar artery: Normal. --Superior cerebellar arteries: Normal. --Posterior cerebral arteries (PCA): Normal. ANTERIOR CIRCULATION: --Intracranial internal carotid arteries: Normal. --Anterior cerebral arteries (ACA): Normal. Both A1 segments are present. Patent anterior communicating artery (a-comm). --Middle cerebral arteries (MCA): Normal. ANATOMIC VARIANTS: None Review of the MIP images confirms the above findings. IMPRESSION: Normal CTA of the head and neck. Electronically Signed   By: Ulyses Jarred M.D.   On: 08/20/2020 22:31   CT HEAD W & WO CONTRAST  Result Date: 08/20/2020 CLINICAL DATA:  Brain mass.  Abnormal CT. EXAM: CT HEAD WITHOUT AND WITH CONTRAST TECHNIQUE: Contiguous axial images were obtained from the base of the skull through the vertex without and with intravenous contrast CONTRAST:  26mL OMNIPAQUE IOHEXOL 300 MG/ML  SOLN COMPARISON:  CT head without contrast 08/17/2020. FINDINGS: Brain: The area of hypoattenuation involving the posterior left frontal lobe and left parietal lobe is again noted. There is some cortical hypoattenuation and cortical sparing. Postcontrast images demonstrate some enhancement within the cortex. No mass lesion is present. No other focal enhancement is present. Moderate atrophy and white matter disease is otherwise stable. Brainstem and cerebellum are within normal limits. Vascular: Atherosclerotic calcifications are present within the cavernous internal  carotid arteries. No hyperdense vessel is present. Skull: Calvarium is intact. No focal lytic or blastic lesions are present. No significant extracranial soft tissue lesion is present. Sinuses/Orbits: The paranasal sinuses and mastoid air cells are clear. The globes and orbits are within normal limits. IMPRESSION: 1. Stable appearance of cortical and subcortical hypoattenuation in the left posterior frontal and parietal  lobe. There is some enhancement the cortex without a discrete mass lesion. Neoplasm is considered unlikely. This is concerning for a venous infarct. Arterial infarct considered less likely. Given the patient cannot have MRI, follow-up CT recommended to evaluate for expected evolution. 2. Stable atrophy and white matter disease. This likely reflects the sequela of chronic microvascular ischemia. 3. Atherosclerosis. Electronically Signed   By: San Morelle M.D.   On: 08/20/2020 18:01   CT ANGIO NECK W OR WO CONTRAST  Result Date: 08/20/2020 CLINICAL DATA:  Ataxia EXAM: CT ANGIOGRAPHY HEAD AND NECK TECHNIQUE: Multidetector CT imaging of the head and neck was performed using the standard protocol during bolus administration of intravenous contrast. Multiplanar CT image reconstructions and MIPs were obtained to evaluate the vascular anatomy. Carotid stenosis measurements (when applicable) are obtained utilizing NASCET criteria, using the distal internal carotid diameter as the denominator. CONTRAST:  74mL OMNIPAQUE IOHEXOL 350 MG/ML SOLN COMPARISON:  None. FINDINGS: CTA NECK FINDINGS SKELETON: There is no bony spinal canal stenosis. No lytic or blastic lesion. OTHER NECK: Normal pharynx, larynx and major salivary glands. No cervical lymphadenopathy. Unremarkable thyroid gland. UPPER CHEST: No pneumothorax or pleural effusion. No nodules or masses. AORTIC ARCH: There is no calcific atherosclerosis of the aortic arch. There is no aneurysm, dissection or hemodynamically significant stenosis of the  visualized portion of the aorta. Conventional 3 vessel aortic branching pattern. The visualized proximal subclavian arteries are widely patent. RIGHT CAROTID SYSTEM: Normal without aneurysm, dissection or stenosis. LEFT CAROTID SYSTEM: Normal without aneurysm, dissection or stenosis. VERTEBRAL ARTERIES: Left dominant configuration. Both origins are clearly patent. There is no dissection, occlusion or flow-limiting stenosis to the skull base (V1-V3 segments). CTA HEAD FINDINGS POSTERIOR CIRCULATION: --Vertebral arteries: Normal V4 segments. --Inferior cerebellar arteries: Normal. --Basilar artery: Normal. --Superior cerebellar arteries: Normal. --Posterior cerebral arteries (PCA): Normal. ANTERIOR CIRCULATION: --Intracranial internal carotid arteries: Normal. --Anterior cerebral arteries (ACA): Normal. Both A1 segments are present. Patent anterior communicating artery (a-comm). --Middle cerebral arteries (MCA): Normal. ANATOMIC VARIANTS: None Review of the MIP images confirms the above findings. IMPRESSION: Normal CTA of the head and neck. Electronically Signed   By: Ulyses Jarred M.D.   On: 08/20/2020 22:31   DG CHEST PORT 1 VIEW  Result Date: 08/20/2020 CLINICAL DATA:  Dyspnea EXAM: PORTABLE CHEST 1 VIEW COMPARISON:  08/17/2020 chest radiograph. FINDINGS: Inferior approach spinal stimulator leads terminate over the the lower thoracic spinal canal. Surgical clips in the upper abdomen bilaterally. Stable cardiomediastinal silhouette with normal heart size. No pneumothorax. No pleural effusion. Lungs appear clear, with no acute consolidative airspace disease and no pulmonary edema. IMPRESSION: No active disease. Electronically Signed   By: Ilona Sorrel M.D.   On: 08/20/2020 09:10   ECHOCARDIOGRAM COMPLETE  Result Date: 08/21/2020    ECHOCARDIOGRAM REPORT   Patient Name:   Morgan Dixon Date of Exam: 08/21/2020 Medical Rec #:  [id number]         Height:       66.0 in Accession #:    [id number]        Weight:        220.0 lb Date of Birth:  1950-12-20         BSA:          2.083 m Patient Age:    54 years          BP:           162/94 mmHg Patient Gender: F  HR:           64 bpm. Exam Location:  Inpatient Procedure: 2D Echo Indications:    stroke  History:        Patient has no prior history of Echocardiogram examinations.  Sonographer:    Delcie Roch Referring Phys: [id number] ASHISH ARORA  Sonographer Comments: No subcostal window. IMPRESSIONS  1. Left ventricular ejection fraction, by estimation, is 60 to 65%. The left ventricle has normal function. The left ventricle has no regional wall motion abnormalities. Left ventricular diastolic parameters were normal.  2. Right ventricular systolic function is normal. The right ventricular size is normal.  3. Left atrial size was mildly dilated.  4. The mitral valve is normal in structure. No evidence of mitral valve regurgitation. No evidence of mitral stenosis.  5. The aortic valve is normal in structure. Aortic valve regurgitation is not visualized. No aortic stenosis is present.  6. The inferior vena cava is normal in size with greater than 50% respiratory variability, suggesting right atrial pressure of 3 mmHg. FINDINGS  Left Ventricle: Left ventricular ejection fraction, by estimation, is 60 to 65%. The left ventricle has normal function. The left ventricle has no regional wall motion abnormalities. The left ventricular internal cavity size was normal in size. There is  no left ventricular hypertrophy. Left ventricular diastolic parameters were normal. Normal left ventricular filling pressure. Right Ventricle: The right ventricular size is normal. No increase in right ventricular wall thickness. Right ventricular systolic function is normal. Left Atrium: Left atrial size was mildly dilated. Right Atrium: Right atrial size was normal in size. Pericardium: There is no evidence of pericardial effusion. Mitral Valve: The mitral valve is normal in structure.  Mild mitral annular calcification. No evidence of mitral valve regurgitation. No evidence of mitral valve stenosis. Tricuspid Valve: The tricuspid valve is normal in structure. Tricuspid valve regurgitation is not demonstrated. No evidence of tricuspid stenosis. Aortic Valve: The aortic valve is normal in structure. Aortic valve regurgitation is not visualized. No aortic stenosis is present. Pulmonic Valve: The pulmonic valve was normal in structure. Pulmonic valve regurgitation is not visualized. No evidence of pulmonic stenosis. Aorta: The aortic root is normal in size and structure. Venous: The inferior vena cava is normal in size with greater than 50% respiratory variability, suggesting right atrial pressure of 3 mmHg. IAS/Shunts: No atrial level shunt detected by color flow Doppler.  LEFT VENTRICLE PLAX 2D LVIDd:         4.00 cm  Diastology LVIDs:         2.10 cm  LV e' medial:    7.51 cm/s LV PW:         1.00 cm  LV E/e' medial:  11.9 LV IVS:        1.10 cm  LV e' lateral:   10.30 cm/s LVOT diam:     1.90 cm  LV E/e' lateral: 8.7 LV SV:         82 LV SV Index:   39 LVOT Area:     2.84 cm  RIGHT VENTRICLE RV S prime:     18.50 cm/s TAPSE (M-mode): 1.9 cm LEFT ATRIUM             Index       RIGHT ATRIUM           Index LA diam:        4.10 cm 1.97 cm/m  RA Area:     12.50 cm LA Vol (A2C):  61.7 ml 29.63 ml/m RA Volume:   26.40 ml  12.68 ml/m LA Vol (A4C):   56.0 ml 26.89 ml/m LA Biplane Vol: 59.0 ml 28.33 ml/m  AORTIC VALVE LVOT Vmax:   135.00 cm/s LVOT Vmean:  86.900 cm/s LVOT VTI:    0.289 m  AORTA Ao Root diam: 3.00 cm Ao Asc diam:  3.10 cm MITRAL VALVE MV Area (PHT): 2.95 cm    SHUNTS MV Decel Time: 257 msec    Systemic VTI:  0.29 m MV E velocity: 89.40 cm/s  Systemic Diam: 1.90 cm MV A velocity: 88.80 cm/s MV E/A ratio:  1.01 Mihai Croitoru MD Electronically signed by Sanda Klein MD Signature Date/Time: 08/21/2020/1:14:10 PM    Final    CT VENOGRAM HEAD  Result Date: 08/20/2020 CLINICAL DATA:   Ataxia EXAM: CT VENOGRAM HEAD TECHNIQUE: Venographic phase images of the brain were obtained following the administration of IV contrast agent. CONTRAST:  4mL OMNIPAQUE IOHEXOL 350 MG/ML SOLN COMPARISON:  Head CT with and without contrast 08/20/2020 FINDINGS: Superior sagittal sinus: Normal. Straight sinus: Normal. Inferior sagittal sinus, vein of Galen and internal cerebral veins: Normal. Transverse sinuses: Normal. Sigmoid sinuses: Normal. Visualized jugular veins: Normal. There is leptomeningeal contrast enhancement over the area abnormal density in the left parietal lobe. IMPRESSION: 1. No dural venous sinus thrombosis. 2. Enhancing leptomeningeal contrast over the area of abnormal density in the left parietal lobe, consistent with subacute infarct. Electronically Signed   By: Ulyses Jarred M.D.   On: 08/20/2020 23:03    Scheduled Meds: . amLODipine  5 mg Oral Daily  . aspirin  300 mg Rectal Daily   Or  . aspirin  325 mg Oral Daily  . methocarbamol  500 mg Oral TID  . pantoprazole (PROTONIX) IV  40 mg Intravenous Q24H  . sertraline  25 mg Oral Daily   Continuous Infusions: . sodium chloride 75 mL/hr at 08/21/20 1018     LOS: 4 days   Marylu Lund, MD [facility] Pager On Amion  If 7PM-7AM, please contact night-coverage 08/21/2020, 5:29 PM

## 2020-08-21 NOTE — Progress Notes (Signed)
  Echocardiogram 2D Echocardiogram has been performed.  Morgan Dixon 08/21/2020, 11:30 AM

## 2020-08-21 NOTE — Progress Notes (Signed)
Physical Therapy Treatment Patient Details Name: Morgan Dixon MRN: [id number] DOB: 29-Apr-1950 Today's Date: 08/21/2020    History of Present Illness Pt is 70 yo female who presents to [facility] with AMS and dysphasia with CT showing L parietal hypodensity concerning for CVA vs mass as well as L renal mass. Pt also with abdominal wound and possible GI bleed. PMH: gastric bypass surgery, HTN, chronic back pain with spinal cord stimulator.    PT Comments    Pt very fatigued today, reports she has not been OOB since transfer to [facility]. Pt able to come to EOB but fatigued quickly in unsupported sitting with forward head and rounded shoulders within 5 mins. Min A needed to stand and it too increased time and great effort from pt. Pt ambulated 15' with RW and min A with increased WOB and need to sit at 15'. VSS despite SOB. COntinue to recommend SNF at d/c. PT will continue to follow.    Follow Up Recommendations  SNF;Supervision for mobility/OOB     Equipment Recommendations  None recommended by PT    Recommendations for Other Services OT consult     Precautions / Restrictions Precautions Precautions: Fall Restrictions Weight Bearing Restrictions: No    Mobility  Bed Mobility Overal bed mobility: Needs Assistance Bed Mobility: Supine to Sit     Supine to sit: Supervision     General bed mobility comments: pt able to come to EOB without physical assist. Pt with cervical and trunk flexion after sitting up only 3-5 mins, fatigued with all activity    Transfers Overall transfer level: Needs assistance Equipment used: Rolling walker (2 wheeled) Transfers: Sit to/from Stand Sit to Stand: Min assist         General transfer comment: min A for safety and pt exerted great effort to achieve full standing  Ambulation/Gait Ambulation/Gait assistance: Min assist Gait Distance (Feet): 15 Feet Assistive device: Rolling walker (2 wheeled) Gait Pattern/deviations: Decreased step length -  right;Decreased step length - left;Decreased stride length;Trunk flexed Gait velocity: decreased Gait velocity interpretation: <1.31 ft/sec, indicative of household ambulator General Gait Details: slow labored cadence with leaning over RW due to fatigue and SOB   Stairs             Wheelchair Mobility    Modified Rankin (Stroke Patients Only) Modified Rankin (Stroke Patients Only) Pre-Morbid Rankin Score: No symptoms Modified Rankin: Moderately severe disability     Balance Overall balance assessment: Needs assistance Sitting-balance support: Feet supported;No upper extremity supported Sitting balance-Leahy Scale: Fair Sitting balance - Comments: slumped posture EOB   Standing balance support: During functional activity;No upper extremity supported Standing balance-Leahy Scale: Poor Standing balance comment: heavily reliant on UE support                            Cognition Arousal/Alertness: Awake/alert Behavior During Therapy: WFL for tasks assessed/performed Overall Cognitive Status: Within Functional Limits for tasks assessed                                 General Comments: pt emotional end of session talking about the loss of her husband and her desire to heal and move on with her life but she is having many health issues since he passed      Exercises General Exercises - Lower Extremity Ankle Circles/Pumps: AROM;Both;10 reps;Seated    General Comments General comments (skin  integrity, edema, etc.): VSS on RA      Pertinent Vitals/Pain Pain Assessment: Faces Faces Pain Scale: Hurts even more Pain Location: L ear Pain Descriptors / Indicators: Grimacing;Aching Pain Intervention(s): Monitored during session    Home Living                      Prior Function            PT Goals (current goals can now be found in the care plan section) Acute Rehab PT Goals Patient Stated Goal: return home able to take care of self  and her dog, Berline Lopes PT Goal Formulation: With patient Time For Goal Achievement: 09/02/20 Potential to Achieve Goals: Good Progress towards PT goals: Progressing toward goals    Frequency    Min 3X/week      PT Plan Current plan remains appropriate    Co-evaluation              AM-PAC PT "6 Clicks" Mobility   Outcome Measure  Help needed turning from your back to your side while in a flat bed without using bedrails?: A Little Help needed moving from lying on your back to sitting on the side of a flat bed without using bedrails?: A Little Help needed moving to and from a bed to a chair (including a wheelchair)?: A Lot Help needed standing up from a chair using your arms (e.g., wheelchair or bedside chair)?: A Lot Help needed to walk in hospital room?: A Lot Help needed climbing 3-5 steps with a railing? : Total 6 Click Score: 13    End of Session Equipment Utilized During Treatment: Gait belt Activity Tolerance: Patient tolerated treatment well;Patient limited by fatigue Patient left: with call bell/phone within reach;in chair;with chair alarm set Nurse Communication: Mobility status PT Visit Diagnosis: Unsteadiness on feet (R26.81);Other abnormalities of gait and mobility (R26.89);Muscle weakness (generalized) (M62.81)     Time: 6378-5885 PT Time Calculation (min) (ACUTE ONLY): 30 min  Charges:  $Gait Training: 8-22 mins $Therapeutic Activity: 8-22 mins                     Leighton Roach, PT  Acute Rehab Services  Pager [phone] Office [place] 08/21/2020, 5:00 PM

## 2020-08-21 NOTE — Progress Notes (Signed)
STROKE TEAM PROGRESS NOTE   HISTORY OF PRESENT ILLNESS (per record) Morgan Dixon is a 70 y.o. female past medical history of chronic back pain/chronic pain, hypertension, status post prior gastric bypass surgery, admitted to [facility] on 08/17/2020 for concerns for altered mental status-admitted for work-up for acute metabolic encephalopathy with head imaging in the form of CT head reporting a left parietal temporal lobe hypodensity concerning for mass as well as a left renal mass. Further imaging of the head with CT head with contrast revealed a possible subacute stroke rather than a mass in the left parietal area. Patient is not able to undergo an MRI due to a spinal stimulator. She is unable to tell me when her last known well clearly was but it was clearly before 08/17/2020 when she presented to the hospital. She says she has been having a lot of trouble bringing out words and completing sentences.  She says she knows what she wants to say but the words do not come out right all the time. Denies any more than usual pain at this time.  Denies headaches.  Denies visual changes. LKW: Sometime before 08/17/2020 tpa given?: no, outside the window Premorbid modified Rankin scale (mRS): Unable to clearly ascertain but lives at home independently so I would imagine it is a 0 or 1.  No family at bedside at this time to confirm   INTERVAL HISTORY She is having her lunch and reports no complaints at this time. She did work so for her altered mental status and found to have a subacute left parietal occipital infarct which seems to be in a watershed distribution although there is concern about the infarct being the notes. 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    OBJECTIVE Vitals:   08/21/20 0600 08/21/20 0820 08/21/20 1239 08/21/20 1556  BP: (!) 154/62 (!) 162/94 (!) 146/61 (!) 153/78  Pulse: (!) 58 61 (!) 56 70  Resp: 18 18     Temp: 97.9 F (36.6 C) 98.5 F (36.9 C) 98.6 F (37 C) 98.8 F (37.1 C)  TempSrc: Oral Oral Oral Oral  SpO2: 97% 98% 97% 100%  Weight:      Height:        CBC:  Recent Labs  Lab 08/20/20 0145 08/21/20 0238  WBC 3.5* 3.7*  HGB 10.4* 10.5*  HCT 31.4* 32.1*  MCV 88.5 88.9  PLT 137* 025    Basic Metabolic Panel:  Recent Labs  Lab 08/18/20 0325 08/20/20 0145 08/21/20 0238  NA 136 137 137  K 3.2* 3.6 3.8  CL 100 109 105  CO2 30 24 25   GLUCOSE 97 97 98  BUN 9 <5* 5*  CREATININE 0.74 0.78 0.70  CALCIUM 8.5* 8.4* 8.7*  MG 1.8  --   --   PHOS 3.5  --   --     Lipid Panel:     Component Value Date/Time   CHOL 162 08/21/2020 0238   TRIG 81 08/21/2020 0238   HDL 58 08/21/2020 0238   CHOLHDL 2.8 08/21/2020 0238   VLDL 16 08/21/2020 0238   LDLCALC 88 08/21/2020 0238   HgbA1c:  Lab Results  Component Value Date   HGBA1C 4.9 08/21/2020   Urine Drug Screen:     Component Value Date/Time   LABOPIA NONE DETECTED 08/17/2020 1615   COCAINSCRNUR NONE DETECTED 08/17/2020 [place] DETECTED 05/24/2020 1959   LABBENZ  NONE DETECTED 08/17/2020 1615   AMPHETMU NONE DETECTED 08/17/2020 1615   THCU NONE DETECTED 08/17/2020 1615   LABBARB NONE DETECTED 08/17/2020 1615    Alcohol Level     Component Value Date/Time   ETH <10 05/24/2020 1717    IMAGING  CT ANGIO HEAD W OR WO CONTRAST  CT ANGIO NECK W OR WO CONTRAST 08/20/2020 IMPRESSION:  Normal CTA of the head and neck.  CT HEAD W & WO CONTRAST 08/20/2020 IMPRESSION:  1. Stable appearance of cortical and subcortical hypoattenuation in the left posterior frontal and parietal lobe. There is some enhancement the cortex without a discrete mass lesion. Neoplasm is considered unlikely. This is concerning for a venous infarct. Arterial infarct considered less likely. Given the patient cannot have MRI, follow-up CT recommended to evaluate for expected evolution.  2. Stable atrophy and white matter disease.  This likely reflects the sequela of chronic microvascular ischemia. 3. Atherosclerosis.   CT VENOGRAM HEAD 08/20/2020 IMPRESSION:  1. No dural venous sinus thrombosis.  2. Enhancing leptomeningeal contrast over the area of abnormal density in the left parietal lobe, consistent with subacute infarct.   DG CHEST PORT 1 VIEW 08/20/2020 IMPRESSION:  No active disease.   ECHOCARDIOGRAM COMPLETE 08/21/2020 IMPRESSIONS   1. Left ventricular ejection fraction, by estimation, is 60 to 65%. The left ventricle has normal function. The left ventricle has no regional wall motion abnormalities. Left ventricular diastolic parameters were normal.   2. Right ventricular systolic function is normal. The right ventricular size is normal.   3. Left atrial size was mildly dilated.   4. The mitral valve is normal in structure. No evidence of mitral valve regurgitation. No evidence of mitral stenosis.   5. The aortic valve is normal in structure. Aortic valve regurgitation is not visualized. No aortic stenosis is present.   6. The inferior vena cava is normal in size with greater than 50% respiratory variability, suggesting right atrial pressure of 3 mmHg.   ECG - SB rate 59 BPM. (See cardiology reading for complete details)   PHYSICAL EXAM Blood pressure (!) 153/78, pulse 70, temperature 98.8 F (37.1 C), temperature source Oral, resp. rate 18, height 5\' 6"  (1.676 m), weight 99.8 kg, SpO2 100 %.   GENERAL:  She is doing well at this time  HEENT:  normal  EXTREMITIES: No edema   SKIN: Normal by inspection.    MENTAL STATUS: Alert and oriented. Speech and cognition are generally intact although she has some subtle naming problems. Judgment and insight normal.   CRANIAL NERVES: Pupils are equal, round and reactive to light and accomodation; extra ocular movements are full, there is no significant nystagmus; visual fields are full - OS but no vision OD; upper and lower facial muscles are normal in strength  and symmetric, there is no flattening of the nasolabial folds; tongue is midline; uvula is midline  MOTOR: Normal tone, bulk and strength; no pronator drift.  COORDINATION: Left finger to nose is normal, right finger to nose is normal, No rest tremor; no intention tremor; no postural tremor; no bradykinesia.  SENSATION: Normal to light touch        ASSESSMENT/PLAN Ms. BELLATRIX DEVONSHIRE is a 70 y.o. female with history of chronic back pain/chronic pain, hypertension, status post prior gastric bypass surgery, and s/p spinal stimulator implant. presenting with AMS and speech difficulties. 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 This appears cryptogenic and therefore 30 day event monitor is recommended.  Resultant   Mild aphasia  Code Stroke CT Head - not ordered      CT head - Stable appearance of cortical and subcortical hypoattenuation in the left posterior frontal and parietal lobe.  MRI head - not ordered - spinal stimulator  CT VENOGRAM HEAD - no dural venous thrombosis  MRA head - not ordered  CTA H&N - negative  CT Perfusion - not ordered  Carotid Doppler - CTA neck ordered - carotid dopplers not indicated.  2D Echo - EF 60 - 65%. No cardiac source of emboli identified.   Sars Corona Virus 2 - negative  LDL - 88  HgbA1c - 4.9  UDS - negative  VTE prophylaxis - SCDs Diet  Diet Order            Diet Heart Room service appropriate? Yes; Fluid consistency: Thin  Diet effective now                 aspirin 81 mg daily prior to admission, now on aspirin 300 mg suppository daily.  Will do dual antiplatelet agents for 3 weeks and then aspirin 325 long-term.  Patient will be counseled to be compliant with her antithrombotic medications  Ongoing aggressive stroke risk factor management  Therapy recommendations:   pending  Disposition:  Pending  Hypertension  Home BP meds: HCTZ ; Catapres ; Zestril  Current BP meds: Norvasc  Stable . Permissive hypertension (OK if < 220/120) but gradually normalize in 5-7 days . Long-term BP goal normotensive  Hyperlipidemia  Home Lipid lowering medication: none   LDL 88, goal < 70  Current lipid lowering medication: none   Continue statin at discharge  Other Stroke Risk Factors  Advanced age  Obesity, Body mass index is 35.51 kg/m., recommend weight loss, diet and exercise as appropriate   Other Active Problems, Findings, Recommendations and/or Plan  Code status - Full code  Anemia    Hospital day # 4    To contact Stroke Continuity provider, please refer to [email or web address] After hours, contact General Neurology

## 2020-08-21 NOTE — Plan of Care (Signed)

## 2020-08-22 ENCOUNTER — Encounter (HOSPITAL_COMMUNITY): Payer: Self-pay | Admitting: Internal Medicine

## 2020-08-22 DIAGNOSIS — G939 Disorder of brain, unspecified: Secondary | ICD-10-CM

## 2020-08-22 DIAGNOSIS — I63412 Cerebral infarction due to embolism of left middle cerebral artery: Principal | ICD-10-CM

## 2020-08-22 DIAGNOSIS — N390 Urinary tract infection, site not specified: Secondary | ICD-10-CM | POA: Diagnosis not present

## 2020-08-22 DIAGNOSIS — R4182 Altered mental status, unspecified: Secondary | ICD-10-CM

## 2020-08-22 LAB — COMPREHENSIVE METABOLIC PANEL
ALT: 18 U/L (ref 0–44)
AST: 23 U/L (ref 15–41)
Albumin: 2.8 g/dL — ABNORMAL LOW (ref 3.5–5.0)
Alkaline Phosphatase: 47 U/L (ref 38–126)
Anion gap: 6 (ref 5–15)
BUN: 9 mg/dL (ref 8–23)
CO2: 26 mmol/L (ref 22–32)
Calcium: 8.9 mg/dL (ref 8.9–10.3)
Chloride: 107 mmol/L (ref 98–111)
Creatinine, Ser: 0.77 mg/dL (ref 0.44–1.00)
GFR, Estimated: >60 mL/min (ref >60)
Glucose, Bld: 99 mg/dL (ref 70–99)
Potassium: 3.6 mmol/L (ref 3.5–5.1)
Sodium: 139 mmol/L (ref 135–145)
Total Bilirubin: 1.1 mg/dL (ref 0.3–1.2)
Total Protein: 5.6 g/dL — ABNORMAL LOW (ref 6.5–8.1)

## 2020-08-22 LAB — CULTURE, BLOOD (ROUTINE X 2)
Culture: NO GROWTH
Culture: NO GROWTH
Special Requests: ADEQUATE
Special Requests: ADEQUATE

## 2020-08-22 LAB — GLUCOSE, CAPILLARY
Glucose-Capillary: 103 mg/dL — ABNORMAL HIGH (ref 70–99)
Glucose-Capillary: 151 mg/dL — ABNORMAL HIGH (ref 70–99)

## 2020-08-22 LAB — CBC
HCT: 32 % — ABNORMAL LOW (ref 36.0–46.0)
Hemoglobin: 10.5 g/dL — ABNORMAL LOW (ref 12.0–15.0)
MCH: 29.4 pg (ref 26.0–34.0)
MCHC: 32.8 g/dL (ref 30.0–36.0)
MCV: 89.6 fL (ref 80.0–100.0)
Platelets: 145 10*3/uL — ABNORMAL LOW (ref 150–400)
RBC: 3.57 MIL/uL — ABNORMAL LOW (ref 3.87–5.11)
RDW: 15.3 % (ref 11.5–15.5)
WBC: 3.2 10*3/uL — ABNORMAL LOW (ref 4.0–10.5)
nRBC: 0 % (ref 0.0–0.2)

## 2020-08-22 MED ORDER — ONDANSETRON HCL 4 MG/2ML IJ SOLN
4.0000 mg | Freq: Four times a day (QID) | INTRAMUSCULAR | Status: DC | PRN
  Administered 2020-08-22: 4 mg via INTRAVENOUS
  Filled 2020-08-22: qty 2

## 2020-08-22 MED ORDER — POLYETHYLENE GLYCOL 3350 17 G PO PACK
17.0000 g | PACK | Freq: Every day | ORAL | Status: DC
  Administered 2020-08-22 – 2020-09-02 (×2): 17 g via ORAL
  Filled 2020-08-22 (×12): qty 1

## 2020-08-22 MED ORDER — ATORVASTATIN CALCIUM 40 MG PO TABS
40.0000 mg | ORAL_TABLET | Freq: Every day | ORAL | Status: DC
  Administered 2020-08-22 – 2020-09-05 (×15): 40 mg via ORAL
  Filled 2020-08-22 (×15): qty 1

## 2020-08-22 MED ORDER — ACETAMINOPHEN 325 MG PO TABS
650.0000 mg | ORAL_TABLET | Freq: Four times a day (QID) | ORAL | Status: DC | PRN
  Administered 2020-08-22 – 2020-08-25 (×3): 650 mg via ORAL
  Filled 2020-08-22 (×3): qty 2
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
## 2020-08-22 NOTE — Evaluation (Addendum)
Occupational Therapy Evaluation Patient Details Name: Morgan Dixon MRN: [id number] DOB: 1950/11/08 Today's Date: 08/22/2020    History of Present Illness Pt is 70 yo female who presents to [facility] with AMS and dysphasia with CT showing L parietal hypodensity concerning for CVA vs mass as well as L renal mass. Pt also with abdominal wound and possible GI bleed. PMH: gastric bypass surgery, HTN, chronic back pain with spinal cord stimulator.   Clinical Impression   PTA, pt was living alone and was independent; enjoys taking care of her dog Berline Lopes. Pt currently requiring Min-Mod A for ADLs and Min A for functional mobility. Pt presenting with decreased balance and activity tolerance impacting her safe functional performance. Despite pain and fatigue, pt agreeable to therapy and performing grooming at sink. At end of session, pt becoming tearful when discussing the recent passing of her husband with MD. Pt would benefit from further acute OT to facilitate safe dc. Recommend dc to CIR for intensive OT to optimize safety, independence with ADLs, and return to PLOF.     Follow Up Recommendations   CIR    Equipment Recommendations  3 in 1 bedside commode    Recommendations for Other Services PT consult     Precautions / Restrictions Precautions Precautions: Fall      Mobility Bed Mobility Overal bed mobility: Needs Assistance Bed Mobility: Rolling;Sidelying to Sit Rolling: Supervision Sidelying to sit: Supervision       General bed mobility comments: Supervision for safety    Transfers Overall transfer level: Needs assistance Equipment used: 1 person hand held assist Transfers: Sit to/from Stand Sit to Stand: Min assist         General transfer comment: MIn A to gain balance    Balance Overall balance assessment: Needs assistance Sitting-balance support: No upper extremity supported;Feet supported Sitting balance-Leahy Scale: Fair     Standing balance support: No upper  extremity supported;During functional activity Standing balance-Leahy Scale: Poor Standing balance comment: Reliant on UE support for balance.                           ADL either performed or assessed with clinical judgement   ADL Overall ADL's : Needs assistance/impaired Eating/Feeding: Set up;Sitting   Grooming: Brushing hair;Min guard;Standing   Upper Body Bathing: Minimal assistance;Sitting   Lower Body Bathing: Minimal assistance;Sit to/from stand   Upper Body Dressing : Minimal assistance;Sitting   Lower Body Dressing: Moderate assistance;Sit to/from stand   Toilet Transfer: Minimal assistance;Ambulation (simulated to recliner)           Functional mobility during ADLs: Minimal assistance General ADL Comments: Pt presenting with decreased balance and activity tolerance. Faitgues quickly. Reliant on single hand held A for support     Vision Baseline Vision/History: Wears glasses Wears Glasses: Reading only Patient Visual Report: No change from baseline       Perception     Praxis      Pertinent Vitals/Pain Pain Assessment: 0-10 Pain Score: 7  Pain Location: adbomen Pain Descriptors / Indicators: Grimacing;Aching Pain Intervention(s): Monitored during session;Limited activity within patient's tolerance;Repositioned     Hand Dominance Right   Extremity/Trunk Assessment Upper Extremity Assessment Upper Extremity Assessment: Generalized weakness   Lower Extremity Assessment Lower Extremity Assessment: Generalized weakness   Cervical / Trunk Assessment Cervical / Trunk Assessment: Other exceptions Cervical / Trunk Exceptions: increased body habitus   Communication Communication Communication: No difficulties   Cognition Arousal/Alertness: Awake/alert Behavior During Therapy:  WFL for tasks assessed/performed Overall Cognitive Status: Within Functional Limits for tasks assessed                                 General  Comments: Seems fatigued and slightly flat. Very agreeable to therapy. Dr arriving at end of session, and ot becoming tearful as she talks about her husband's recent passing   General Comments  SpO2 98% on RA. Though SOB after mobility    Exercises     Shoulder Instructions      Home Living Family/patient expects to be discharged to:: Private residence Living Arrangements: Alone   Type of Home: House Home Access: Ramped entrance     Home Layout: One level     Bathroom Shower/Tub: Occupational psychologist: Handicapped height     Home Equipment: Environmental consultant - 2 wheels;Shower seat          Prior Functioning/Environment Level of Independence: Independent        Comments: ADLs, IADLs, and driving.  Does no use DME        OT Problem List: Decreased strength;Decreased range of motion;Decreased activity tolerance;Impaired balance (sitting and/or standing);Decreased knowledge of use of DME or AE;Decreased knowledge of precautions;Pain      OT Treatment/Interventions: Self-care/ADL training;Therapeutic exercise;Energy conservation;DME and/or AE instruction;Therapeutic activities;Patient/family education    OT Goals(Current goals can be found in the care plan section) Acute Rehab OT Goals Patient Stated Goal: return home able to take care of self and her dog, Berline Lopes OT Goal Formulation: With patient Time For Goal Achievement: 09/05/20 Potential to Achieve Goals: Good  OT Frequency: Min 2X/week   Barriers to D/C:            Co-evaluation              AM-PAC OT "6 Clicks" Daily Activity     Outcome Measure Help from another person eating meals?: None Help from another person taking care of personal grooming?: A Little Help from another person toileting, which includes using toliet, bedpan, or urinal?: A Little Help from another person bathing (including washing, rinsing, drying)?: A Little Help from another person to put on and taking off regular upper body  clothing?: A Little Help from another person to put on and taking off regular lower body clothing?: A Lot 6 Click Score: 18   End of Session Nurse Communication: Mobility status  Activity Tolerance: Patient tolerated treatment well Patient left: in chair;with call bell/phone within reach;with chair alarm set  OT Visit Diagnosis: Other abnormalities of gait and mobility (R26.89);Unsteadiness on feet (R26.81);Muscle weakness (generalized) (M62.81)                Time: 1610-9604 OT Time Calculation (min): 22 min Charges:  OT General Charges $OT Visit: 1 Visit OT Evaluation $OT Eval Moderate Complexity: [place], OTR/L Acute Rehab Pager: [phone] Office: [place] 08/22/2020, 9:58 AM

## 2020-08-22 NOTE — Consult Note (Signed)
Physical Medicine and Rehabilitation Consult   Reason for Consult: Stroke with functional deficits.  Referring Physician: Dr. Wyline Copas   HPI: Morgan Dixon is a 70 y.o. RH-female with history of chronic pain s/p spinal cord stimulator, gastric bypass, recent hernia repair 04/04;  who was admitted via [facility] on 08/17/20 with abdominal pain, frequency, confusion and difficulty talking with CT head showing question of left temporoparietal mass. UDS negative. Dr. Christella Noa recommended CT w/contras as spinal stimulator no MRI compatible. Follow up CT head showed stable appearance of cortical/subcortical hypoattenuation in left posterior frontal and parietal lobe without discrete mass and concerns of venous infarct. CT venogram was negative for dural venous sinus thrombosis and enhancing leptomeningeal contrast over left parietal lobe c/w subacute infarct. CTA head/neck was negative for stenosis, AVM or aneurysm. 2D echo showed EF 60-65% with no wall abnormality and cardiology consulted due to concerns of cardiac source of infarct. Patient has elected on 30 day event monitor after d/c as has an open abdominal wound and would prefer for it to heal first. Therapy ongoing and patient limited by fatigue, weakness and increased WOB affecting ADLs and mobility CIR recommended due to functional decline.   She reports that she has been staying with a friend since abdominal surgery and was back home?    Review of Systems  Constitutional: Negative for chills and fever.  HENT: Negative for hearing loss.   Eyes: Negative for blurred vision and double vision.  Respiratory: Positive for shortness of breath.   Cardiovascular: Positive for leg swelling. Negative for chest pain.  Gastrointestinal: Negative for abdominal pain and heartburn.  Genitourinary: Positive for urgency.  Musculoskeletal: Positive for back pain.  Skin: Negative for rash.  Neurological: Positive for speech change. Negative for dizziness and  headaches.     Past Medical History:  Diagnosis Date  . Chronic pain   . Edema extremities     Past Surgical History:  Procedure Laterality Date  . ABDOMINAL HYSTERECTOMY  1989  . BACK SURGERY    . GASTRIC BYPASS      Family History  Problem Relation Age of Onset  . Breast cancer Maternal Aunt 60  . Diabetes Father   . 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 . Lives alone and independent PTA. She  reports that she has never smoked. She has never used smokeless tobacco. She reports drinks alcohol occasionally. She does not use not use drugs.   Allergies  Allergen Reactions  . Bupropion Nausea And Vomiting  . Diazepam Rash  . Penicillins Itching  . Rofecoxib Swelling  . Latex Itching    When used gloves in the past  . Prozac [Fluoxetine Hcl] Itching and Rash  . Zinc Oxide [Mexsana] Rash    Medications Prior to Admission  Medication Sig Dispense Refill  . acetaminophen (TYLENOL) 500 MG tablet Take 500 mg by mouth every 6 (six) hours as needed for mild pain or moderate pain.    Marland Kitchen buPROPion (WELLBUTRIN SR) 150 MG 12 hr tablet Take 300 mg by mouth 2 (two) times daily.    Marland Kitchen enoxaparin (LOVENOX) 40 MG/0.4ML injection Inject 40 mg into the skin daily.    . hydrochlorothiazide (HYDRODIURIL) 25 MG tablet Take 1 tablet by mouth daily.    . Multiple Vitamin (MULTIVITAMIN) tablet Take 1 tablet by mouth daily. Bariatric vitamin    . naloxone (NARCAN) nasal spray 4 mg/0.1 mL Place 1 spray into  the nose as needed (overdose).    . ondansetron (ZOFRAN-ODT) 4 MG disintegrating tablet Take 4 mg by mouth every 8 (eight) hours as needed for nausea or vomiting.    . pantoprazole (PROTONIX) 40 MG tablet Take 40 mg by mouth daily.    . potassium chloride (KLOR-CON) 10 MEQ tablet Take 10 mEq by mouth 2 (two) times daily.    . pregabalin (LYRICA) 100 MG capsule Take 200 mg by mouth 2 (two) times daily.    . sertraline (ZOLOFT) 25  MG tablet Take 25 mg by mouth at bedtime.    Marland Kitchen tiZANidine (ZANAFLEX) 4 MG tablet Take 4 mg by mouth 3 (three) times daily as needed for muscle spasms.    . cloNIDine (CATAPRES) 0.1 MG tablet 1 tab po tid x 2 days, then bid x 2 days, then once daily x 2 days (Patient not taking: No sig reported) 12 tablet 0  . ondansetron (ZOFRAN) 4 MG tablet Take 1 tablet (4 mg total) by mouth every 6 (six) hours as needed. (Patient not taking: No sig reported) 20 tablet 0    Home: Home Living Family/patient expects to be discharged to:: Private residence Living Arrangements: Alone Available Help at Discharge: Other (Comment) (has no help per patient) Type of Home: House Home Access: Ramped entrance Home Layout: One level Bathroom Shower/Tub: Multimedia programmer: Handicapped height Bathroom Accessibility: Yes Home Equipment: Environmental consultant - 2 wheels,Shower seat  Functional History: Prior Function Level of Independence: Independent Comments: ADLs, IADLs, and driving.  Does no use DME Functional Status:  Mobility: Bed Mobility Overal bed mobility: Needs Assistance Bed Mobility: Rolling,Sidelying to Sit Rolling: Supervision Sidelying to sit: Supervision Supine to sit: Supervision Sit to supine: Supervision General bed mobility comments: Supervision for safety Transfers Overall transfer level: Needs assistance Equipment used: 1 person hand held assist Transfers: Sit to/from Stand Sit to Stand: Min assist Stand pivot transfers: Min assist General transfer comment: MIn A to gain balance Ambulation/Gait Ambulation/Gait assistance: Min assist Gait Distance (Feet): 15 Feet Assistive device: Rolling walker (2 wheeled) Gait Pattern/deviations: Decreased step length - right,Decreased step length - left,Decreased stride length,Trunk flexed General Gait Details: slow labored cadence with leaning over RW due to fatigue and SOB Gait velocity: decreased Gait velocity interpretation: <1.31 ft/sec,  indicative of household ambulator    ADL: ADL Overall ADL's : Needs assistance/impaired Eating/Feeding: Set up,Sitting Grooming: Brushing hair,Min guard,Standing Upper Body Bathing: Minimal assistance,Sitting Lower Body Bathing: Minimal assistance,Sit to/from stand Upper Body Dressing : Minimal assistance,Sitting Lower Body Dressing: Moderate assistance,Sit to/from stand Toilet Transfer: Minimal assistance,Ambulation (simulated to recliner) Functional mobility during ADLs: Minimal assistance General ADL Comments: Pt presenting with decreased balance and activity tolerance. Faitgues quickly. Reliant on single hand held A for support  Cognition: Cognition Overall Cognitive Status: Within Functional Limits for tasks assessed Orientation Level: Oriented X4 Cognition Arousal/Alertness: Awake/alert Behavior During Therapy: WFL for tasks assessed/performed Overall Cognitive Status: Within Functional Limits for tasks assessed General Comments: Seems fatigued and slightly flat. Very agreeable to therapy. Dr arriving at end of session, and ot becoming tearful as she talks about her husband's recent passing   Blood pressure (!) 155/62, pulse 60, temperature 97.9 F (36.6 C), temperature source Oral, resp. rate 18, height 5\' 6"  (1.676 m), weight 99.8 kg, SpO2 100 %. Physical Exam Vitals and nursing note reviewed.  Constitutional:      Appearance: Normal appearance.  HENT:     Head: Normocephalic and atraumatic.     Nose: Nose normal.  Eyes:     Extraocular Movements: Extraocular movements intact.     Conjunctiva/sclera: Conjunctivae normal.     Pupils: Pupils are equal, round, and reactive to light.  Cardiovascular:     Rate and Rhythm: Normal rate and regular rhythm.     Heart sounds: Normal heart sounds.  Pulmonary:     Effort: Pulmonary effort is normal. No respiratory distress.     Breath sounds: Normal breath sounds.  Abdominal:     General: Abdomen is flat. Bowel sounds are  normal. There is no distension.     Palpations: Abdomen is soft.     Tenderness: There is no abdominal tenderness.     Comments: Abdomen small 1 cm midline granulating area from abdominal hernia repair  Musculoskeletal:     Comments: Min edema BLE  No pain with upper extremity or lower extremity range of motion Healed scars from knee replacements bilaterally.  Skin:    General: Skin is warm and dry.  Neurological:     Mental Status: She is alert and oriented to person, place, and time.     Cranial Nerves: Cranial nerves are intact.     Sensory: Sensation is intact.     Motor: No tremor or abnormal muscle tone.     Comments:   Able to follow basic commands without difficulty.   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        Behavior: Behavior normal.    Gait is not tested Results for orders placed or performed during the hospital encounter of 08/17/20 (from the past 24 hour(s))  Glucose, capillary     Status: Abnormal   Collection Time: 08/21/20  5:48 PM  Result Value Ref Range   Glucose-Capillary 128 (H) 70 - 99 mg/dL  Glucose, capillary     Status: Abnormal   Collection Time: 08/22/20 12:08 AM  Result Value Ref Range   Glucose-Capillary 151 (H) 70 - 99 mg/dL  Glucose, capillary     Status: Abnormal   Collection Time: 08/22/20  6:21 AM  Result Value Ref Range   Glucose-Capillary 103 (H) 70 - 99 mg/dL  Comprehensive metabolic panel     Status: Abnormal   Collection Time: 08/22/20  7:16 AM  Result Value Ref Range   Sodium 139 135 - 145 mmol/L   Potassium 3.6 3.5 - 5.1 mmol/L   Chloride 107 98 - 111 mmol/L   CO2 26 22 - 32 mmol/L    Glucose, Bld 99 70 - 99 mg/dL   BUN 9 8 - 23 mg/dL   Creatinine, Ser 0.77 0.44 - 1.00 mg/dL   Calcium 8.9 8.9 - 10.3 mg/dL   Total Protein 5.6 (L) 6.5 - 8.1 g/dL   Albumin 2.8 (L) 3.5 - 5.0 g/dL   AST 23 15 - 41 U/L   ALT 18 0 - 44 U/L   Alkaline Phosphatase 47 38 - 126 U/L   Total Bilirubin 1.1 0.3 - 1.2 mg/dL   GFR, Estimated >60 >60 mL/min   Anion gap 6 5 - 15  CBC     Status: Abnormal   Collection Time: 08/22/20  7:16 AM  Result Value Ref Range   WBC 3.2 (L) 4.0 - 10.5 K/uL   RBC 3.57 (L) 3.87 - 5.11 MIL/uL   Hemoglobin 10.5 (L) 12.0 - 15.0 g/dL   HCT 32.0 (L) 36.0 - 46.0 %   MCV 89.6 80.0 - 100.0 fL   MCH 29.4 26.0 - 34.0 pg   MCHC 32.8 30.0 - 36.0 g/dL   RDW 15.3 11.5 - 15.5 %   Platelets 145 (L) 150 - 400 K/uL   nRBC 0.0 0.0 - 0.2 %   CT ANGIO HEAD W OR WO CONTRAST  Result Date: 08/20/2020 CLINICAL DATA:  Ataxia EXAM: CT ANGIOGRAPHY HEAD AND NECK TECHNIQUE: Multidetector CT imaging of the head and neck was performed using the standard protocol during bolus administration of intravenous contrast. Multiplanar CT image reconstructions and MIPs were obtained to evaluate the vascular anatomy. Carotid stenosis measurements (when applicable) are obtained utilizing NASCET criteria, using the distal internal carotid diameter as the denominator. CONTRAST:  73mL OMNIPAQUE IOHEXOL 350 MG/ML SOLN COMPARISON:  None. FINDINGS: CTA NECK FINDINGS SKELETON: There is no bony spinal canal stenosis. No lytic or blastic lesion. OTHER NECK: Normal pharynx, larynx and major salivary glands. No cervical lymphadenopathy. Unremarkable thyroid gland. UPPER CHEST: No pneumothorax or pleural effusion. No nodules or masses. AORTIC ARCH: There is no calcific atherosclerosis of the aortic arch. There is no aneurysm, dissection or hemodynamically significant stenosis of the visualized portion of the aorta. Conventional 3 vessel aortic branching pattern. The visualized proximal subclavian arteries are widely patent.  RIGHT CAROTID SYSTEM: Normal without aneurysm, dissection or stenosis. LEFT CAROTID SYSTEM: Normal without aneurysm, dissection or stenosis. VERTEBRAL ARTERIES: Left dominant configuration. Both origins are clearly patent. There is no dissection, occlusion or flow-limiting stenosis to the skull base (V1-V3 segments). CTA HEAD FINDINGS POSTERIOR CIRCULATION: --Vertebral arteries: Normal V4 segments. --Inferior cerebellar arteries: Normal. --Basilar artery: Normal. --Superior cerebellar arteries: Normal. --Posterior cerebral arteries (PCA): Normal. ANTERIOR CIRCULATION: --Intracranial internal carotid arteries: Normal. --Anterior cerebral arteries (ACA): Normal. Both A1 segments are present. Patent anterior communicating artery (a-comm). --Middle cerebral arteries (MCA): Normal. ANATOMIC VARIANTS: None Review of the MIP images confirms the above findings. IMPRESSION: Normal CTA of the head and neck. Electronically Signed   By: Ulyses Jarred M.D.   On: 08/20/2020 22:31   CT HEAD W & WO CONTRAST  Result Date: 08/20/2020 CLINICAL DATA:  Brain mass.  Abnormal CT. EXAM: CT HEAD WITHOUT AND WITH CONTRAST TECHNIQUE: Contiguous axial images were obtained from the base of the skull through the vertex without and with intravenous contrast CONTRAST:  25mL OMNIPAQUE IOHEXOL 300 MG/ML  SOLN COMPARISON:  CT head without contrast 08/17/2020. FINDINGS: Brain: The area of hypoattenuation involving the posterior left frontal lobe and left parietal lobe is again noted. There is some cortical hypoattenuation and cortical sparing. Postcontrast images demonstrate some enhancement within the cortex. No mass lesion is present. No other focal enhancement is present. Moderate atrophy and white matter disease is otherwise stable. Brainstem and cerebellum are within normal limits. Vascular: Atherosclerotic calcifications are present within the cavernous internal carotid arteries. No hyperdense vessel is present. Skull: Calvarium is intact. No  focal lytic or blastic lesions are present. No significant extracranial soft tissue lesion is present. Sinuses/Orbits: The paranasal sinuses and mastoid air cells are clear. The globes and orbits are within normal limits. IMPRESSION: 1. Stable appearance of cortical and subcortical hypoattenuation in the left posterior frontal and parietal lobe. There is some enhancement the cortex without a discrete mass lesion. Neoplasm is  considered unlikely. This is concerning for a venous infarct. Arterial infarct considered less likely. Given the patient cannot have MRI, follow-up CT recommended to evaluate for expected evolution. 2. Stable atrophy and white matter disease. This likely reflects the sequela of chronic microvascular ischemia. 3. Atherosclerosis. Electronically Signed   By: San Morelle M.D.   On: 08/20/2020 18:01   CT ANGIO NECK W OR WO CONTRAST  Result Date: 08/20/2020 CLINICAL DATA:  Ataxia EXAM: CT ANGIOGRAPHY HEAD AND NECK TECHNIQUE: Multidetector CT imaging of the head and neck was performed using the standard protocol during bolus administration of intravenous contrast. Multiplanar CT image reconstructions and MIPs were obtained to evaluate the vascular anatomy. Carotid stenosis measurements (when applicable) are obtained utilizing NASCET criteria, using the distal internal carotid diameter as the denominator. CONTRAST:  63mL OMNIPAQUE IOHEXOL 350 MG/ML SOLN COMPARISON:  None. FINDINGS: CTA NECK FINDINGS SKELETON: There is no bony spinal canal stenosis. No lytic or blastic lesion. OTHER NECK: Normal pharynx, larynx and major salivary glands. No cervical lymphadenopathy. Unremarkable thyroid gland. UPPER CHEST: No pneumothorax or pleural effusion. No nodules or masses. AORTIC ARCH: There is no calcific atherosclerosis of the aortic arch. There is no aneurysm, dissection or hemodynamically significant stenosis of the visualized portion of the aorta. Conventional 3 vessel aortic branching pattern.  The visualized proximal subclavian arteries are widely patent. RIGHT CAROTID SYSTEM: Normal without aneurysm, dissection or stenosis. LEFT CAROTID SYSTEM: Normal without aneurysm, dissection or stenosis. VERTEBRAL ARTERIES: Left dominant configuration. Both origins are clearly patent. There is no dissection, occlusion or flow-limiting stenosis to the skull base (V1-V3 segments). CTA HEAD FINDINGS POSTERIOR CIRCULATION: --Vertebral arteries: Normal V4 segments. --Inferior cerebellar arteries: Normal. --Basilar artery: Normal. --Superior cerebellar arteries: Normal. --Posterior cerebral arteries (PCA): Normal. ANTERIOR CIRCULATION: --Intracranial internal carotid arteries: Normal. --Anterior cerebral arteries (ACA): Normal. Both A1 segments are present. Patent anterior communicating artery (a-comm). --Middle cerebral arteries (MCA): Normal. ANATOMIC VARIANTS: None Review of the MIP images confirms the above findings. IMPRESSION: Normal CTA of the head and neck. Electronically Signed   By: Ulyses Jarred M.D.   On: 08/20/2020 22:31   ECHOCARDIOGRAM COMPLETE  Result Date: 08/21/2020    ECHOCARDIOGRAM REPORT   Patient Name:   Morgan Dixon Date of Exam: 08/21/2020 Medical Rec #:  [id number]         Height:       66.0 in Accession #:    [id number]        Weight:       220.0 lb Date of Birth:  11-07-1950         BSA:          2.083 m Patient Age:    89 years          BP:           162/94 mmHg Patient Gender: F                 HR:           64 bpm. Exam Location:  Inpatient Procedure: 2D Echo Indications:    stroke  History:        Patient has no prior history of Echocardiogram examinations.  Sonographer:    Johny Chess Referring Phys: [id number] ASHISH ARORA  Sonographer Comments: No subcostal window. IMPRESSIONS  1. Left ventricular ejection fraction, by estimation, is 60 to 65%. The left ventricle has normal function. The left ventricle has no regional wall motion abnormalities. Left ventricular diastolic  parameters were normal.  2. Right ventricular systolic function is normal. The right ventricular size is normal.  3. Left atrial size was mildly dilated.  4. The mitral valve is normal in structure. No evidence of mitral valve regurgitation. No evidence of mitral stenosis.  5. The aortic valve is normal in structure. Aortic valve regurgitation is not visualized. No aortic stenosis is present.  6. The inferior vena cava is normal in size with greater than 50% respiratory variability, suggesting right atrial pressure of 3 mmHg. FINDINGS  Left Ventricle: Left ventricular ejection fraction, by estimation, is 60 to 65%. The left ventricle has normal function. The left ventricle has no regional wall motion abnormalities. The left ventricular internal cavity size was normal in size. There is  no left ventricular hypertrophy. Left ventricular diastolic parameters were normal. Normal left ventricular filling pressure. Right Ventricle: The right ventricular size is normal. No increase in right ventricular wall thickness. Right ventricular systolic function is normal. Left Atrium: Left atrial size was mildly dilated. Right Atrium: Right atrial size was normal in size. Pericardium: There is no evidence of pericardial effusion. Mitral Valve: The mitral valve is normal in structure. Mild mitral annular calcification. No evidence of mitral valve regurgitation. No evidence of mitral valve stenosis. Tricuspid Valve: The tricuspid valve is normal in structure. Tricuspid valve regurgitation is not demonstrated. No evidence of tricuspid stenosis. Aortic Valve: The aortic valve is normal in structure. Aortic valve regurgitation is not visualized. No aortic stenosis is present. Pulmonic Valve: The pulmonic valve was normal in structure. Pulmonic valve regurgitation is not visualized. No evidence of pulmonic stenosis. Aorta: The aortic root is normal in size and structure. Venous: The inferior vena cava is normal in size with greater than  50% respiratory variability, suggesting right atrial pressure of 3 mmHg. IAS/Shunts: No atrial level shunt detected by color flow Doppler.  LEFT VENTRICLE PLAX 2D LVIDd:         4.00 cm  Diastology LVIDs:         2.10 cm  LV e' medial:    7.51 cm/s LV PW:         1.00 cm  LV E/e' medial:  11.9 LV IVS:        1.10 cm  LV e' lateral:   10.30 cm/s LVOT diam:     1.90 cm  LV E/e' lateral: 8.7 LV SV:         82 LV SV Index:   39 LVOT Area:     2.84 cm  RIGHT VENTRICLE RV S prime:     18.50 cm/s TAPSE (M-mode): 1.9 cm LEFT ATRIUM             Index       RIGHT ATRIUM           Index LA diam:        4.10 cm 1.97 cm/m  RA Area:     12.50 cm LA Vol (A2C):   61.7 ml 29.63 ml/m RA Volume:   26.40 ml  12.68 ml/m LA Vol (A4C):   56.0 ml 26.89 ml/m LA Biplane Vol: 59.0 ml 28.33 ml/m  AORTIC VALVE LVOT Vmax:   135.00 cm/s LVOT Vmean:  86.900 cm/s LVOT VTI:    0.289 m  AORTA Ao Root diam: 3.00 cm Ao Asc diam:  3.10 cm MITRAL VALVE MV Area (PHT): 2.95 cm    SHUNTS MV Decel Time: 257 msec    Systemic VTI:  0.29 m MV E velocity: 89.40 cm/s  Systemic Diam:  1.90 cm MV A velocity: 88.80 cm/s MV E/A ratio:  1.01 Mihai Croitoru MD Electronically signed by Sanda Klein MD Signature Date/Time: 08/21/2020/1:14:10 PM    Final    CT VENOGRAM HEAD  Result Date: 08/20/2020 CLINICAL DATA:  Ataxia EXAM: CT VENOGRAM HEAD TECHNIQUE: Venographic phase images of the brain were obtained following the administration of IV contrast agent. CONTRAST:  55mL OMNIPAQUE IOHEXOL 350 MG/ML SOLN COMPARISON:  Head CT with and without contrast 08/20/2020 FINDINGS: Superior sagittal sinus: Normal. Straight sinus: Normal. Inferior sagittal sinus, vein of Galen and internal cerebral veins: Normal. Transverse sinuses: Normal. Sigmoid sinuses: Normal. Visualized jugular veins: Normal. There is leptomeningeal contrast enhancement over the area abnormal density in the left parietal lobe. IMPRESSION: 1. No dural venous sinus thrombosis. 2. Enhancing leptomeningeal  contrast over the area of abnormal density in the left parietal lobe, consistent with subacute infarct. Electronically Signed   By: Ulyses Jarred M.D.   On: 08/20/2020 23:03     Assessment/Plan: Diagnosis: Left frontal parietal infarct question cardioembolic 1. Does the need for close, 24 hr/day medical supervision in concert with the patient's rehab needs make it unreasonable for this patient to be served in a less intensive setting? Yes 2. Co-Morbidities requiring supervision/potential complications: Morbid obesity, recent abdominal hernia repair, hypertension 3. Due to bladder management, bowel management, safety, skin/wound care, disease management, medication administration, pain management and patient education, does the patient require 24 hr/day rehab nursing? Yes 4. Does the patient require coordinated care of a physician, rehab nurse, therapy disciplines of PT OT speech 5.  to address physical and functional deficits in the context of the above medical diagnosis(es)? Yes Addressing deficits in the following areas: balance, endurance, locomotion, strength, transferring, bowel/bladder control, bathing, dressing, toileting and psychosocial support 6. Can the patient actively participate in an intensive therapy program of at least 3 hrs of therapy per day at least 5 days per week? Yes 7. The potential for patient to make measurable gains while on inpatient rehab is good 8. Anticipated functional outcomes upon discharge from inpatient rehab are modified independent and supervision  with PT, modified independent and supervision with OT, modified independent and supervision with SLP. 9. Estimated rehab length of stay to reach the above functional goals is: 10 to 14 days 10. Anticipated discharge destination: Home 11. Overall Rehab/Functional Prognosis: good  RECOMMENDATIONS: This patient's condition is appropriate for continued rehabilitative care in the following setting: CIR Patient has  agreed to participate in recommended program. Yes Note that insurance prior authorization may be required for reimbursement for recommended care.  Comment: Patient cannot identify a caregiver post rehab although she did stay with somebody postoperatively.   Bary Leriche, PA-C 08/22/2020  "I have personally performed a face to face diagnostic evaluation of this patient.  Additionally, I have reviewed and concur with the physician assistant's documentation above."  Charlett Blake M.D. [facility] Fellow [employer] Re[employer] Diplomate Am Board of Electrodiagnostic Med Fellow Am Board of Interventional Pain

## 2020-08-22 NOTE — Progress Notes (Signed)
STROKE TEAM PROGRESS NOTE   HISTORY OF PRESENT ILLNESS (per record) Morgan Dixon is a 70 y.o. female past medical history of chronic back pain/chronic pain, hypertension, status post prior gastric bypass surgery, admitted to [facility] on 08/17/2020 for concerns for altered mental status-admitted for work-up for acute metabolic encephalopathy with head imaging in the form of CT head reporting a left parietal temporal lobe hypodensity concerning for mass as well as a left renal mass. Further imaging of the head with CT head with contrast revealed a possible subacute stroke rather than a mass in the left parietal area. Patient is not able to undergo an MRI due to a spinal stimulator. She is unable to tell me when her last known well clearly was but it was clearly before 08/17/2020 when she presented to the hospital. She says she has been having a lot of trouble bringing out words and completing sentences.  She says she knows what she wants to say but the words do not come out right all the time. Denies any more than usual pain at this time.  Denies headaches.  Denies visual changes. LKW: Sometime before 08/17/2020 tpa given?: no, outside the window Premorbid modified Rankin scale (mRS): Unable to clearly ascertain but lives at home independently so I would imagine it is a 0 or 1.  No family at bedside at this time to confirm   INTERVAL HISTORY Sitting up in chair No visitors at bedside Difficulty with word finding, unable to repeat 3 word phrase, names well.  Vital signs stable.  CT scan of the head with contrast shows subacute left parietal infarct CT angiogram of the brain showed no large vessel stenosis or occlusion in the brain or neck.  CT venogram is unremarkable.  She cannot have an MRI as she has spinal cord stimulator.  Transthoracic echo showed normal ejection fraction of 60 to 65% without wall motion abnormalities.   OBJECTIVE Vitals:   08/21/20 2352 08/22/20 0338 08/22/20 0746  08/22/20 0929  BP: (!) 154/70 (!) 158/66 (!) 185/75 (!) 187/68  Pulse: (!) 58 (!) 53 69 66  Resp: 17 19 16    Temp: 98 F (36.7 C) (!) 97.5 F (36.4 C) 98.8 F (37.1 C) 98.3 F (36.8 C)  TempSrc: Oral Oral Oral Oral  SpO2: 98% 97% 100% 100%  Weight:      Height:        CBC:  Recent Labs  Lab 08/21/20 0238 08/22/20 0716  WBC 3.7* 3.2*  HGB 10.5* 10.5*  HCT 32.1* 32.0*  MCV 88.9 89.6  PLT 151 145*    Basic Metabolic Panel:  Recent Labs  Lab 08/18/20 0325 08/20/20 0145 08/21/20 0238 08/22/20 0716  NA 136   < > 137 139  K 3.2*   < > 3.8 3.6  CL 100   < > 105 107  CO2 30   < > 25 26  GLUCOSE 97   < > 98 99  BUN 9   < > 5* 9  CREATININE 0.74   < > 0.70 0.77  CALCIUM 8.5*   < > 8.7* 8.9  MG 1.8  --   --   --   PHOS 3.5  --   --   --    < > = values in this interval not displayed.    Lipid Panel:     Component Value Date/Time   CHOL 162 08/21/2020 0238   TRIG 81 08/21/2020 0238   HDL 58 08/21/2020 0238  CHOLHDL 2.8 08/21/2020 0238   VLDL 16 08/21/2020 0238   LDLCALC 88 08/21/2020 0238   HgbA1c:  Lab Results  Component Value Date   HGBA1C 4.9 08/21/2020   Urine Drug Screen:     Component Value Date/Time   LABOPIA NONE DETECTED 08/17/2020 1615   COCAINSCRNUR NONE DETECTED 08/17/2020 1615   COCAINSCRNUR NONE DETECTED 05/24/2020 1959   LABBENZ NONE DETECTED 08/17/2020 1615   AMPHETMU NONE DETECTED 08/17/2020 1615   THCU NONE DETECTED 08/17/2020 1615   LABBARB NONE DETECTED 08/17/2020 1615    Alcohol Level     Component Value Date/Time   ETH <10 05/24/2020 1717    IMAGING  CT ANGIO HEAD W OR WO CONTRAST  CT ANGIO NECK W OR WO CONTRAST 08/20/2020 IMPRESSION:  Normal CTA of the head and neck.  CT HEAD W & WO CONTRAST 08/20/2020 IMPRESSION:  1. Stable appearance of cortical and subcortical hypoattenuation in the left posterior frontal and parietal lobe. There is some enhancement the cortex without a discrete mass lesion. Neoplasm is considered  unlikely. This is concerning for a venous infarct. Arterial infarct considered less likely. Given the patient cannot have MRI, follow-up CT recommended to evaluate for expected evolution.  2. Stable atrophy and white matter disease. This likely reflects the sequela of chronic microvascular ischemia. 3. Atherosclerosis.   CT VENOGRAM HEAD 08/20/2020 IMPRESSION:  1. No dural venous sinus thrombosis.  2. Enhancing leptomeningeal contrast over the area of abnormal density in the left parietal lobe, consistent with subacute infarct.   DG CHEST PORT 1 VIEW 08/20/2020 IMPRESSION:  No active disease.   ECHOCARDIOGRAM COMPLETE 08/21/2020 IMPRESSIONS   1. Left ventricular ejection fraction, by estimation, is 60 to 65%. The left ventricle has normal function. The left ventricle has no regional wall motion abnormalities. Left ventricular diastolic parameters were normal.   2. Right ventricular systolic function is normal. The right ventricular size is normal.   3. Left atrial size was mildly dilated.   4. The mitral valve is normal in structure. No evidence of mitral valve regurgitation. No evidence of mitral stenosis.   5. The aortic valve is normal in structure. Aortic valve regurgitation is not visualized. No aortic stenosis is present.   6. The inferior vena cava is normal in size with greater than 50% respiratory variability, suggesting right atrial pressure of 3 mmHg.   ECG - SB rate 59 BPM. (See cardiology reading for complete details)   PHYSICAL EXAM Blood pressure (!) 187/68, pulse 66, temperature 98.3 F (36.8 C), temperature source Oral, resp. rate 16, height 5\' 6"  (1.676 m), weight 99.8 kg, SpO2 100 %.   GENERAL:  She is doing well at this time  HEENT:  normal  EXTREMITIES: No edema   SKIN: Normal by inspection.    MENTAL STATUS: Alert and oriented. Speech and cognition are generally intact although she has some subtle naming problems. Judgment and insight normal.   CRANIAL  NERVES: Pupils are equal, round and reactive to light and accomodation; extra ocular movements are full, there is no significant nystagmus; visual fields are full - OS but no vision OD; upper and lower facial muscles are normal in strength and symmetric, there is no flattening of the nasolabial folds; tongue is midline; uvula is midline  MOTOR: Normal tone, bulk and strength; no pronator drift.  COORDINATION: Left finger to nose is normal, right finger to nose is normal, No rest tremor; no intention tremor; no postural tremor; no bradykinesia.  SENSATION: Normal to light  touch        ASSESSMENT/PLAN Morgan Dixon is a 70 y.o. female with history of chronic back pain/chronic pain, hypertension, status post prior gastric bypass surgery, and s/p spinal stimulator implant. presenting with AMS and speech difficulties. 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 This appears cryptogenic and therefore 30 day event monitor is recommended.  Resultant   Mild aphasia  Code Stroke CT Head - not ordered      CT head - Stable appearance of cortical and subcortical hypoattenuation in the left posterior frontal and parietal lobe.  MRI head - not ordered - spinal stimulator  CT VENOGRAM HEAD - no dural venous thrombosis  MRA head - not ordered  CTA H&N - negative  CT Perfusion - not ordered  Carotid Doppler - CTA neck ordered - carotid dopplers not indicated.  2D Echo - EF 60 - 65%. No cardiac source of emboli identified.   Sars Corona Virus 2 - negative  LDL - 88  HgbA1c - 4.9  UDS - negative  VTE prophylaxis - SCDs Diet  Diet Order            Diet Heart Room service appropriate? Yes; Fluid consistency: Thin  Diet effective now                 aspirin 81 mg daily prior to admission, now on aspirin 300 mg suppository daily.  Will do dual  antiplatelet agents for 3 weeks and then aspirin 325 long-term.  Patient will be counseled to be compliant with her antithrombotic medications  Ongoing aggressive stroke risk factor management  Therapy recommendations:  pending  Disposition:  Pending  Hypertension  Home BP meds: HCTZ ; Catapres ; Zestril  Current BP meds: Norvasc  Stable . Permissive hypertension (OK if < 220/120) but gradually normalize in 5-7 days . Long-term BP goal normotensive  Hyperlipidemia  Home Lipid lowering medication: none   LDL 88, goal < 70  Current lipid lowering medication: none   Continue statin at discharge  Other Stroke Risk Factors  Advanced age  Obesity, Body mass index is 35.51 kg/m., recommend weight loss, diet and exercise as appropriate   Other Active Problems, Findings, Recommendations and/or Plan  Code status - Full code  Anemia    Hospital day # 5  Patient presented with subacute left parietal embolic infarct of cryptogenic etiology.  She cannot have an MRI due to spinal cord stimulator.  She needs loop recorder for prolonged cardiac monitoring for paroxysmal A. fib.  Recommend aspirin Plavix for 3 weeks followed by Plavix alone.  Add Lipitor 40 mg daily for elevated LDL.  Follow-up as an outpatient stroke clinic in 6 weeks.  Greater than 50% time during this 25-minute visit was spent in counseling and coordination of care and discussion with care team.  Stroke team will sign off.  Kindly call for questions. Antony Contras, MD To contact Stroke Continuity provider, please refer to [email or web address] After hours, contact General Neurology

## 2020-08-22 NOTE — Consult Note (Addendum)
ELECTROPHYSIOLOGY CONSULT NOTE  Patient ID: Morgan Dixon MRN: [id number], DOB/AGE: April 22, 1950   Admit date: 08/17/2020 Date of Consult: 08/22/2020  Primary Physician: Alfonse Flavors, MD Primary Cardiologist: none Reason for Consultation: Cryptogenic stroke ; recommendations regarding Implantable Loop Recorder, requested by Dr. Leonie Man  History of Present Illness Morgan Dixon was admitted on 08/17/2020 with AMS at [facility], initially w/u for metabolic etiology, head imaging  in the form of CT head reporting a left parietal temporal lobe hypodensity concerning for mass as well as a left renal mass. Further imaging of the head with CT head with contrast revealed a possible subacute stroke rather than a mass in the left parietal area. Patient is not able to undergo an MRI due to a spinal stimulator.    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 This appears cryptogenic and therefore 30 day event monitor is recommended. We have subsequently been asked to consider loop implant   she has undergone workup for stroke including echocardiogram and carotid angio.  The patient has been monitored on telemetry which has demonstrated sinus rhythm with no arrhythmias. Neurology has deferred TEE   Echocardiogram this admission demonstrated  IMPRESSIONS  1. Left ventricular ejection fraction, by estimation, is 60 to 65%. The  left ventricle has normal function. The left ventricle has no regional  wall motion abnormalities. Left ventricular diastolic parameters were  normal.  2. Right ventricular systolic function is normal. The right ventricular  size is normal.  3. Left atrial size was mildly dilated.  4. The mitral valve is normal in structure. No evidence of mitral valve  regurgitation. No evidence of  mitral stenosis.  5. The aortic valve is normal in structure. Aortic valve regurgitation is  not visualized. No aortic stenosis is present.  6. The inferior vena cava is normal in size with greater than 50%  respiratory variability, suggesting right atrial pressure of 3 mmHg.    Lab work is reviewed.  ++ she has an abdominal wound (described as having a clean base), care recommendations: place a small piece of saline moistened gauze into the abdominal wound, cover with dry gauze, tape in place. she has h/o open RYGB (1984, Dr. Maricela Curet) who has recently had intermittent BP limb obstruction with a dilated, fluid filled gastric remnant which was vented with a surgically placed G-tube. On 07/12/20, she then underwent a laparoscopic subtotal gastric remnant gastrectomy followed by port site hernia repair on on 07/12/20   Prior to admission, the patient denies chest pain, shortness of breath, dizziness, palpitations, or syncope.  She is recovering from their stroke with plans to SNF at discharge, pending bed.      Past Medical History:  Diagnosis Date  . Chronic pain   . Edema extremities      Surgical History:  Past Surgical History:  Procedure Laterality Date  . ABDOMINAL HYSTERECTOMY  1989  . BACK SURGERY    . GASTRIC BYPASS       Medications Prior to Admission  Medication Sig Dispense Refill Last Dose  . acetaminophen (TYLENOL) 500 MG tablet Take 500 mg by mouth every 6 (six) hours as needed for mild pain or moderate pain.     Marland Kitchen buPROPion (WELLBUTRIN SR) 150 MG 12 hr tablet Take 300 mg by mouth 2 (two) times daily.     Marland Kitchen  enoxaparin (LOVENOX) 40 MG/0.4ML injection Inject 40 mg into the skin daily.     . hydrochlorothiazide (HYDRODIURIL) 25 MG tablet Take 1 tablet by mouth daily.     . Multiple Vitamin (MULTIVITAMIN) tablet Take 1 tablet by mouth daily. Bariatric vitamin     . naloxone (NARCAN) nasal spray 4 mg/0.1 mL Place 1 spray into the nose as needed (overdose).     .  ondansetron (ZOFRAN-ODT) 4 MG disintegrating tablet Take 4 mg by mouth every 8 (eight) hours as needed for nausea or vomiting.     . pantoprazole (PROTONIX) 40 MG tablet Take 40 mg by mouth daily.     . potassium chloride (KLOR-CON) 10 MEQ tablet Take 10 mEq by mouth 2 (two) times daily.     . pregabalin (LYRICA) 100 MG capsule Take 200 mg by mouth 2 (two) times daily.     . sertraline (ZOLOFT) 25 MG tablet Take 25 mg by mouth at bedtime.     Marland Kitchen tiZANidine (ZANAFLEX) 4 MG tablet Take 4 mg by mouth 3 (three) times daily as needed for muscle spasms.     . cloNIDine (CATAPRES) 0.1 MG tablet 1 tab po tid x 2 days, then bid x 2 days, then once daily x 2 days (Patient not taking: No sig reported) 12 tablet 0 Not Taking at Unknown time  . ondansetron (ZOFRAN) 4 MG tablet Take 1 tablet (4 mg total) by mouth every 6 (six) hours as needed. (Patient not taking: No sig reported) 20 tablet 0 Not Taking at Unknown time    Inpatient Medications:  . amLODipine  5 mg Oral Daily  . aspirin  300 mg Rectal Daily   Or  . aspirin  325 mg Oral Daily  . clopidogrel  75 mg Oral Q breakfast  . methocarbamol  500 mg Oral TID  . pantoprazole (PROTONIX) IV  40 mg Intravenous Q24H  . sertraline  25 mg Oral Daily    Allergies:  Allergies  Allergen Reactions  . Bupropion Nausea And Vomiting  . Diazepam Rash  . Penicillins Itching  . Rofecoxib Swelling  . Latex Itching    When used gloves in the past  . Prozac [Fluoxetine Hcl] Itching and Rash  . Zinc Oxide [Mexsana] Rash    Social History   Socioeconomic History  . Marital status: Widowed    Spouse name: Not on file  . Number of children: Not on file  . Years of education: Not on file  . Highest education level: Not on file  Occupational History  . Not on file  Tobacco Use  . Smoking status: Never Smoker  . Smokeless tobacco: Never Used  Vaping Use  . Vaping Use: Never used  Substance and Sexual Activity  . Alcohol use: Never  . Drug use: Never  .  Sexual activity: Not on file  Other Topics Concern  . Not on file  Social History Narrative  . Not on file   Social Determinants of Health   Financial Resource Strain: Not on file  Food Insecurity: Not on file  Transportation Needs: Not on file  Physical Activity: Not on file  Stress: Not on file  Social Connections: Not on file  Intimate Partner Violence: Not on file     Family History  Problem Relation Age of Onset  . Breast cancer Maternal Aunt 60      Review of Systems: All other systems reviewed and are otherwise negative except as noted above.  Physical Exam: Vitals:  08/21/20 2352 08/22/20 0338 08/22/20 0746 08/22/20 0929  BP: (!) 154/70 (!) 158/66 (!) 185/75 (!) 187/68  Pulse: (!) 58 (!) 53 69 66  Resp: 17 19 16    Temp: 98 F (36.7 C) (!) 97.5 F (36.4 C) 98.8 F (37.1 C) 98.3 F (36.8 C)  TempSrc: Oral Oral Oral Oral  SpO2: 98% 97% 100% 100%  Weight:      Height:        GEN- The patient is well appearing, alert and oriented x 3 today.   Head- normocephalic, atraumatic Eyes-  Sclera clear, conjunctiva pink Ears- hearing intact Oropharynx- clear Neck- supple Lungs-  CTA b/l, normal work of breathing Heart- RRR, soft SM, rubs or gallops  GI- soft, NT, ND, wound is dressed, clean and dry Extremities- no clubbing, cyanosis, or edema MS- no significant deformity or atrophy Skin- no rash or lesion Psych- euthymic mood, full affect   Labs:   Lab Results  Component Value Date   WBC 3.2 (L) 08/22/2020   HGB 10.5 (L) 08/22/2020   HCT 32.0 (L) 08/22/2020   MCV 89.6 08/22/2020   PLT 145 (L) 08/22/2020    Recent Labs  Lab 08/22/20 0716  NA 139  K 3.6  CL 107  CO2 26  BUN 9  CREATININE 0.77  CALCIUM 8.9  PROT 5.6*  BILITOT 1.1  ALKPHOS 47  ALT 18  AST 23  GLUCOSE 99   Lab Results  Component Value Date   CKTOTAL 74 12/11/2012   CKMB 0.9 12/11/2012   TROPONINI < 0.02 12/11/2012   Lab Results  Component Value Date   CHOL 162  08/21/2020   Lab Results  Component Value Date   HDL 58 08/21/2020   Lab Results  Component Value Date   LDLCALC 88 08/21/2020   Lab Results  Component Value Date   TRIG 81 08/21/2020   Lab Results  Component Value Date   CHOLHDL 2.8 08/21/2020   No results found for: LDLDIRECT  No results found for: DDIMER   Radiology/Studies:  CT ANGIO HEAD W OR WO CONTRAST Result Date: 08/20/2020 CLINICAL DATA:  Ataxia EXAM: CT ANGIOGRAPHY HEAD AND NECK TECHNIQUE: Multidetector CT imaging of the head and neck was performed using the standard protocol during bolus administration of intravenous contrast. Multiplanar CT image reconstructions and MIPs were obtained to evaluate the vascular anatomy. Carotid stenosis measurements (when applicable) are obtained utilizing NASCET criteria, using the distal internal carotid diameter as the denominator. CONTRAST:  43mL OMNIPAQUE IOHEXOL 350 MG/ML SOLN COMPARISON:  None. FINDINGS: CTA NECK FINDINGS SKELETON: There is no bony spinal canal stenosis. No lytic or blastic lesion. OTHER NECK: Normal pharynx, larynx and major salivary glands. No cervical lymphadenopathy. Unremarkable thyroid gland. UPPER CHEST: No pneumothorax or pleural effusion. No nodules or masses. AORTIC ARCH: There is no calcific atherosclerosis of the aortic arch. There is no aneurysm, dissection or hemodynamically significant stenosis of the visualized portion of the aorta. Conventional 3 vessel aortic branching pattern. The visualized proximal subclavian arteries are widely patent. RIGHT CAROTID SYSTEM: Normal without aneurysm, dissection or stenosis. LEFT CAROTID SYSTEM: Normal without aneurysm, dissection or stenosis. VERTEBRAL ARTERIES: Left dominant configuration. Both origins are clearly patent. There is no dissection, occlusion or flow-limiting stenosis to the skull base (V1-V3 segments). CTA HEAD FINDINGS POSTERIOR CIRCULATION: --Vertebral arteries: Normal V4 segments. --Inferior cerebellar  arteries: Normal. --Basilar artery: Normal. --Superior cerebellar arteries: Normal. --Posterior cerebral arteries (PCA): Normal. ANTERIOR CIRCULATION: --Intracranial internal carotid arteries: Normal. --Anterior cerebral arteries (ACA): Normal.  Both A1 segments are present. Patent anterior communicating artery (a-comm). --Middle cerebral arteries (MCA): Normal. ANATOMIC VARIANTS: None Review of the MIP images confirms the above findings. IMPRESSION: Normal CTA of the head and neck. Electronically Signed   By: Ulyses Jarred M.D.   On: 08/20/2020 22:31     CT Head Wo Contrast Result Date: 08/17/2020 CLINICAL DATA:  Altered mental status. EXAM: CT HEAD WITHOUT CONTRAST TECHNIQUE: Contiguous axial images were obtained from the base of the skull through the vertex without intravenous contrast. COMPARISON:  None. FINDINGS: Brain: Hypoattenuation in the posterior left temporal and parietal lobes is mainly in the white matter with overlying cortex largely preserved worrisome for tumor. No hemorrhage, mass, midline shift or abnormal extra-axial fluid collection is identified. No hydrocephalus or pneumocephalus. Vascular: No hyperdense vessel or unexpected calcification. Skull: Intact.  No focal lesion. Sinuses/Orbits: Negative. Other: None. IMPRESSION: Hypoattenuation left temporoparietal lobe has an appearance worrisome for neoplasm. Recommend brain MRI with and without contrast. Electronically Signed   By: Inge Rise M.D.   On: 08/17/2020 16:46     CT HEAD W & WO CONTRAST Result Date: 08/20/2020 CLINICAL DATA:  Brain mass.  Abnormal CT. EXAM: CT HEAD WITHOUT AND WITH CONTRAST TECHNIQUE: Contiguous axial images were obtained from the base of the skull through the vertex without and with intravenous contrast CONTRAST:  28mL OMNIPAQUE IOHEXOL 300 MG/ML  SOLN COMPARISON:  CT head without contrast 08/17/2020. FINDINGS: Brain: The area of hypoattenuation involving the posterior left frontal lobe and left parietal  lobe is again noted. There is some cortical hypoattenuation and cortical sparing. Postcontrast images demonstrate some enhancement within the cortex. No mass lesion is present. No other focal enhancement is present. Moderate atrophy and white matter disease is otherwise stable. Brainstem and cerebellum are within normal limits. Vascular: Atherosclerotic calcifications are present within the cavernous internal carotid arteries. No hyperdense vessel is present. Skull: Calvarium is intact. No focal lytic or blastic lesions are present. No significant extracranial soft tissue lesion is present. Sinuses/Orbits: The paranasal sinuses and mastoid air cells are clear. The globes and orbits are within normal limits. IMPRESSION: 1. Stable appearance of cortical and subcortical hypoattenuation in the left posterior frontal and parietal lobe. There is some enhancement the cortex without a discrete mass lesion. Neoplasm is considered unlikely. This is concerning for a venous infarct. Arterial infarct considered less likely. Given the patient cannot have MRI, follow-up CT recommended to evaluate for expected evolution. 2. Stable atrophy and white matter disease. This likely reflects the sequela of chronic microvascular ischemia. 3. Atherosclerosis. Electronically Signed   By: San Morelle M.D.   On: 08/20/2020 18:01     CT Abdomen Pelvis W Contrast Result Date: 08/17/2020 CLINICAL DATA:  Abdominal pain. Recent gastric bypass surgery. Positive Hemoccult. EXAM: CT ABDOMEN AND PELVIS WITH CONTRAST TECHNIQUE: Multidetector CT imaging of the abdomen and pelvis was performed using the standard protocol following bolus administration of intravenous contrast. CONTRAST:  140mL OMNIPAQUE IOHEXOL 300 MG/ML  SOLN COMPARISON:  05/24/2020. FINDINGS: Lower chest: No acute abnormality. Hepatobiliary: Liver normal in size. No mass or focal lesion. Status post cholecystectomy. No bile duct dilation. Pancreas: Pancreatic atrophy.  No  mass.  No inflammation. Spleen: Prominent spleen, 13 cm in greatest dimension, unchanged. No splenic masses. Adrenals/Urinary Tract: No adrenal masses. Kidneys are normal in overall size and position. Symmetric renal enhancement and excretion. Small mass arises from the posteromedial midpole of the left kidney, 1.3 cm in greatest dimension, increased in size from the  prior CT where it measured 1 cm. Mass has average Hounsfield units of 54. No other renal masses, no stones and no hydronephrosis. Ureters are normal in course and in caliber. Bladder is unremarkable. Stomach/Bowel: Stomach demonstrates postsurgical changes. Small gastric remnant. Stomach appears to into the into a gastrojejunostomy. Duodenum appears ligated, lying porta hepatis. Small bowel and colon are normal in caliber. No wall thickening. No evidence of inflammation. Vascular/Lymphatic: No significant vascular findings are present. No enlarged abdominal or pelvic lymph nodes. Reproductive: Status post hysterectomy. No adnexal masses. Other: No abdominal wall hernia. Scarring in the midline upper abdomen where there was a small hernia on the prior exam. No ascites. Musculoskeletal: No fracture or acute finding. No bone lesion. Stable spine stimulator leads along the posterior lower thoracic spinal canal. IMPRESSION: 1. No acute abnormalities within the abdomen or pelvis. No evidence of bowel obstruction or inflammation. 2. 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 No evidence of a surgery complication. 3. Previously seen ventral hernia has been repaired. No residual or new abdominal wall hernia. 4. Small left renal cortical mass, posteromedial midpole, which is larger than prior exam. This could reflect a small renal neoplasm. Recommend follow-up renal MRI without and with contrast, if this patient can tolerate that procedure. If patient cannot have MRI, recommend follow-up CT the abdomen without and  with contrast in 3-6 months to assess for stability. Electronically Signed   By: Lajean Manes M.D.   On: 08/17/2020 16:48     CT VENOGRAM HEAD Result Date:08/20/2020 CLINICAL DATA:  Ataxia EXAM: CT VENOGRAM HEAD TECHNIQUE: Venographic phase images of the brain were obtained following the administration of IV contrast agent. CONTRAST:  69mL OMNIPAQUE IOHEXOL 350 MG/ML SOLN COMPARISON:  Head CT with and without contrast 08/20/2020 FINDINGS: Superior sagittal sinus: Normal. Straight sinus: Normal. Inferior sagittal sinus, vein of Galen and internal cerebral veins: Normal. Transverse sinuses: Normal. Sigmoid sinuses: Normal. Visualized jugular veins: Normal. There is leptomeningeal contrast enhancement over the area abnormal density in the left parietal lobe. IMPRESSION: 1. No dural venous sinus thrombosis. 2. Enhancing leptomeningeal contrast over the area of abnormal density in the left parietal lobe, consistent with subacute infarct. Electronically Signed   By: Ulyses Jarred M.D.   On: 08/20/2020 23:03    12-lead ECG Sb/SR All prior EKG's in EPIC reviewed with no documented atrial fibrillation  Telemetry SR  Assessment and Plan:  1. Cryptogenic stroke  I spoke at length with the patient about monitoring for afib with either a 30 day event monitor or an implantable loop recorder.  Risks, benefits, and alteratives to implantable loop recorder were discussed with the patient today.     GIven her current abdominal wound, recommend waiting on loop implant untilthis is healed completely. Dr. Quentin Ore has seen the patient I have made neurology APP aware of our recommendations I will make EP follow up out patient  Baldwin Jamaica, PA-C 08/22/2020

## 2020-08-22 NOTE — Progress Notes (Signed)
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 Active Problems:   Abdominal pain   Altered mental status   Hypokalemia   GI bleed   Elevated AST (SGOT)   Elevated troponin I level   Left renal mass   Acute metabolic encephalopathy  1)Brain Lesion ruled out with L parietal subacute infarct ruled in -Initial concern was for possible L brain mass on non-contrast CT head -Pt was transferred to [facility] for formal Neurosurgical eval -CT head with contrast performed which ruled out mass, but demonstrated L parietal subacute venous infarct vs possible subacute arterial infarct -Neurology has since been consulted -2d echo reviewed, appears unremarkable -Continues to have word-finding difficulties, ability to understand remains intact -Neurology had been following. Recommendation for aspirin with plavix x 3 weeks followed by plavix alone -SNF has been recommended by therapy, TOC has been consulted. CIR is also being evaluated -Cardiology consulted for loop recorder   2)S/p Prior Billroth 2 procedure Now with Possible GI bleed--- -Heme positive stool on admission  -Hgb has remained stable with no evidence of acute blood loss thus far -Discussed case with GI on call. Given concerns of  stroke, recommendation is to avoid endoscopic w/u until cleared neurologically or if acute blood loss is noted -Would continue to follow. Recommend close outpt f/u with pt's GI provider  3)Left Renal Mass--  If patient cannot have MRI for further evaluation then, recommend follow-up CT the abdomen without and with contrast in 3-6 months to assess for stability.  4)Anterior abdominal wall wound--- please see photos in epic - anterior abdominal wall with clean base dime size open wound  , does Not look infected -Wound care consult appreciated--- recommends Twice daily  place a small piece of saline moistened gauze into the abdominal wound, cover with dry gauze, tape in place. -Remains stable at this time  5)Acute Anemia-- Hgb is down to 10.3 from 11.2 on admission after hydration, suspect some hemodilution effect -Hemoglobin usually above 12, hgb has remained stable thus far, reviewed trends -Please see #2 above  6)UTI ruled out--- low clinical index of suspicion,  -stopped rocephin -Currently remains stable  7)Chronic pain syndrome--previously had a neurostimulator, patient sees pain clinic at [facility] is negative -Hold Lyrica, hold Nucynta, hold Neurontin, -Continue methocarbamol ,stop Zanaflex -Okay to use iv fentanyl as needed for severe pain and oxycodone as needed for moderate pain  8)Acute metabolic encephalopathy--suspect secondary to #1 above  9)Depression and Anxiety--okay to continue Zoloft, may use Xanax as needed  10) social/ethics--Full Code noted  DVT prophylaxis: SCD's Code Status: Full Family Communication: Pt in room, family not at bedside  Status is: Inpatient  Remains inpatient appropriate because:Inpatient level of care appropriate due to severity of illness   Dispo: The patient is from: Home  Anticipated d/c is to: SNF              Patient currently is not medically stable to d/c.   Difficult to place patient No   Consultants:    GI  Neurosurgery  Neurology  Cardiology  Procedures:     Antimicrobials: Anti-infectives (From admission, onward)   Start     Dose/Rate Route Frequency Ordered Stop   08/17/20 2130  cefTRIAXone (ROCEPHIN) 1 g in sodium chloride 0.9 % 100 mL IVPB  Status:  Discontinued        1 g 200 mL/hr over 30 Minutes Intravenous Every 24 hours 08/17/20 2115 08/19/20 0955      Subjective: Tearful when talking about her late husband who died several months ago after a short battle with lung cancer.  Objective: Vitals:   08/22/20 0746 08/22/20 0929 08/22/20 1158 08/22/20 1529  BP: (!) 185/75 (!) 187/68 (!) 155/62 (!) 148/49  Pulse: 69 66 60 72  Resp: 16  18 16   Temp: 98.8 F (37.1 C) 98.3 F (36.8 C) 97.9 F (36.6 C) 98.6 F (37 C)  TempSrc: Oral Oral Oral Oral  SpO2: 100% 100% 100% 100%  Weight:      Height:        Intake/Output Summary (Last 24 hours) at 08/22/2020 1803 Last data filed at 08/22/2020 1400 Gross per 24 hour  Intake 2298.59 ml  Output --  Net 2298.59 ml   Filed Weights   08/17/20 1352 08/19/20 0328  Weight: 97.5 kg 99.8 kg    Examination: General exam: Awake, sitting in chair, in nad Respiratory system: Normal respiratory effort, no wheezing Cardiovascular system: regular rate, s1, s2 Gastrointestinal system: Soft, nondistended, positive BS Central nervous system: CN2-12 grossly intact, strength intact, word finding difficulties Extremities: Perfused, no clubbing Skin: Normal skin turgor, no notable skin lesions seen Psychiatry: Mood sad, tearful // no visual hallucinations   Data Reviewed: I have personally reviewed following labs and imaging studies  CBC: Recent Labs  Lab 08/17/20 1407 08/18/20 0325 08/20/20 0145 08/21/20 0238 08/22/20 0716  WBC 4.6 4.3 3.5* 3.7* 3.2*  HGB 11.3* 10.3* 10.4* 10.5* 10.5*  HCT 34.1* 31.4* 31.4* 32.1* 32.0*  MCV 89.0 89.5 88.5 88.9 89.6  PLT 147* 141* 137* 151 [id number]*   Basic Metabolic Panel: Recent Labs   Lab 08/17/20 1407 08/18/20 0325 08/20/20 0145 08/21/20 0238 08/22/20 0716  NA 135 136 137 137 139  K 3.3* 3.2* 3.6 3.8 3.6  CL 97* 100 109 105 107  CO2 30 30 24 25 26   GLUCOSE 108* 97 97 98 99  BUN 13 9 <5* 5* 9  CREATININE 0.73 0.74 0.78 0.70 0.77  CALCIUM 8.9 8.5* 8.4* 8.7* 8.9  MG  --  1.8  --   --   --   PHOS  --  3.5  --   --   --    GFR: Estimated Creatinine Clearance: 79.1 mL/min (by C-G formula based on SCr of 0.77 mg/dL). Liver Function Tests: Recent Labs  Lab 08/17/20 1407 08/18/20 0325 08/20/20 0145 08/21/20 0238 08/22/20 0716  AST 76* 64* 44* 32 23  ALT 27 23 23 20 18   ALKPHOS 57 49 48 48 47  BILITOT 2.1* 2.1* 1.4* 0.9 1.1  PROT 6.6 5.7* 5.3* 5.6* 5.6*  ALBUMIN 3.4* 2.9* 2.7* 2.7* 2.8*   No results for input(s): LIPASE, AMYLASE in the last 168 hours. Recent Labs  Lab 08/17/20 1722  AMMONIA 29   Coagulation Profile: Recent Labs  Lab 08/17/20 1407 08/18/20 0325  INR 1.1 1.1   Cardiac Enzymes: No results for input(s): CKTOTAL, CKMB, CKMBINDEX, TROPONINI in the last 168 hours. BNP (last 3 results) No results for input(s): PROBNP in the last 8760 hours. HbA1C: Recent Labs    08/21/20 0238  HGBA1C 4.9   CBG: Recent Labs  Lab 08/21/20 0614 08/21/20 1236 08/21/20 1748 08/22/20 0008 08/22/20 0621  GLUCAP 106* 112* 128* 151* 103*   Lipid Profile: Recent Labs    08/21/20 0238  CHOL 162  HDL 58  LDLCALC 88  TRIG 81  CHOLHDL 2.8   Thyroid Function Tests: No results for input(s): TSH, T4TOTAL, FREET4, T3FREE, THYROIDAB in the last 72 hours. Anemia Panel: No results for input(s): VITAMINB12, FOLATE, FERRITIN, TIBC, IRON, RETICCTPCT in the last 72 hours. Sepsis Labs: Recent Labs  Lab 08/17/20 1407 08/17/20 1551  LATICACIDVEN 1.3 1.5    Recent Results (from the past 240 hour(s))  Blood culture (routine x 2)     Status: None   Collection Time: 08/17/20  3:51 PM   Specimen: BLOOD RIGHT HAND  Result Value Ref Range Status    Specimen Description BLOOD RIGHT HAND  Final   Special Requests   Final    Blood Culture adequate volume BOTTLES DRAWN AEROBIC AND ANAEROBIC   Culture   Final    NO GROWTH 5 DAYS Performed at [facility], [address], [place], [place] [address]    Report Status 08/22/2020 FINAL  Final  Blood culture (routine x 2)     Status: None   Collection Time: 08/17/20  3:51 PM   Specimen: BLOOD  Result Value Ref Range Status   Specimen Description BLOOD LEFT ANTECUBITAL  Final   Special Requests   Final    Blood Culture adequate volume BOTTLES DRAWN AEROBIC AND ANAEROBIC   Culture   Final    NO GROWTH 5 DAYS Performed at [facility], [address] [place], [place] [address]    Report Status 08/22/2020 FINAL  Final  Urine culture     Status: None   Collection Time: 08/17/20  9:00 PM   Specimen: Urine, Random  Result Value Ref Range Status   Specimen Description   Final    URINE, RANDOM Performed at [facility], [address] [place], [place] [address]    Special Requests   Final    NONE Performed at [facility], [address], [place], [place] [address]    Culture   Final    NO GROWTH Performed at [facility], [place] [address], [place], [place] [address]    Report Status 08/19/2020 FINAL  Final  SARS CORONAVIRUS 2 (TAT 6-24 HRS) Nasopharyngeal Nasopharyngeal Swab     Status: None   Collection Time: 08/17/20  9:39 PM   Specimen: Nasopharyngeal Swab  Result Value Ref Range Status   SARS Coronavirus 2 NEGATIVE NEGATIVE Final    Comment: (NOTE) SARS-CoV-2 target nucleic acids are NOT DETECTED.  The SARS-CoV-2 RNA is generally detectable in upper and lower respiratory specimens during the acute phase of infection. Negative results do not preclude SARS-CoV-2 infection, do not rule out co-infections with other pathogens, and should not be used as the sole basis for treatment or other patient management decisions. Negative results must be combined with clinical  observations, patient history, and epidemiological information. The expected result is Negative.  Fact Sheet for Patients: SugarRoll.be  Fact Sheet for Healthcare Providers: https://www.woods-mathews.com/  This test is not yet approved or cleared  by the [place] and  has been authorized for detection and/or diagnosis of SARS-CoV-2 by FDA under an Emergency Use Authorization (EUA). This EUA will remain  in effect (meaning this test can be used) for the duration of the COVID-19 declaration under Se ction 564(b)(1) of the Act, 21 U.S.C. section 360bbb-3(b)(1), unless the authorization is terminated or revoked sooner.  Performed at [facility], [place] [address] [place], [place] [address]   MRSA PCR Screening     Status: None   Collection Time: 08/19/20  9:30 AM   Specimen: Nasopharyngeal  Result Value Ref Range Status   MRSA by PCR NEGATIVE NEGATIVE Final    Comment:        The GeneXpert MRSA Assay (FDA approved for NASAL specimens only), is one component of a comprehensive MRSA colonization surveillance program. It is not intended to diagnose MRSA infection nor to guide or monitor treatment for MRSA infections. Performed at [facility], [address] [place], [place] [address]      Radiology Studies: CT ANGIO HEAD W OR WO CONTRAST  Result Date: 08/20/2020 CLINICAL DATA:  Ataxia EXAM: CT ANGIOGRAPHY HEAD AND NECK TECHNIQUE: Multidetector CT imaging of the head and neck was performed using the standard protocol during bolus administration of intravenous contrast. Multiplanar CT image reconstructions and MIPs were obtained to evaluate the vascular anatomy. Carotid stenosis measurements (when applicable) are obtained utilizing NASCET criteria, using the distal internal carotid diameter as the denominator. CONTRAST:  71mL OMNIPAQUE IOHEXOL 350 MG/ML SOLN COMPARISON:  None. FINDINGS: CTA NECK FINDINGS SKELETON: There is no bony  spinal canal stenosis. No lytic or blastic lesion. OTHER NECK: Normal pharynx, larynx and major salivary glands. No cervical lymphadenopathy. Unremarkable thyroid gland. UPPER CHEST: No pneumothorax or pleural effusion. No nodules or masses. AORTIC ARCH: There is no calcific atherosclerosis of the aortic arch. There is no aneurysm, dissection or hemodynamically significant stenosis of the visualized portion of the aorta. Conventional 3 vessel aortic branching pattern. The visualized proximal subclavian arteries are widely patent. RIGHT CAROTID SYSTEM: Normal without aneurysm, dissection or stenosis. LEFT CAROTID SYSTEM: Normal without aneurysm, dissection or stenosis. VERTEBRAL ARTERIES: Left dominant configuration. Both origins are clearly patent. There is no dissection, occlusion or flow-limiting stenosis to the skull base (V1-V3 segments). CTA HEAD FINDINGS POSTERIOR CIRCULATION: --Vertebral arteries: Normal V4 segments. --Inferior cerebellar arteries: Normal. --Basilar artery: Normal. --Superior cerebellar arteries: Normal. --Posterior cerebral arteries (PCA): Normal. ANTERIOR CIRCULATION: --Intracranial internal carotid arteries: Normal. --Anterior cerebral arteries (ACA): Normal. Both A1 segments are present. Patent anterior communicating artery (a-comm). --Middle cerebral arteries (MCA): Normal. ANATOMIC VARIANTS: None Review of the MIP images confirms the above findings. IMPRESSION: Normal CTA of the head and neck. Electronically Signed   By: Ulyses Jarred M.D.   On: 08/20/2020 22:31   CT ANGIO NECK W OR WO CONTRAST  Result Date: 08/20/2020 CLINICAL DATA:  Ataxia EXAM: CT ANGIOGRAPHY HEAD AND NECK TECHNIQUE: Multidetector CT imaging of the head and neck was performed using the standard protocol during bolus administration of intravenous contrast. Multiplanar CT image reconstructions and MIPs were obtained to evaluate the vascular anatomy. Carotid stenosis measurements (when applicable) are obtained  utilizing NASCET criteria, using the distal internal carotid diameter as the denominator. CONTRAST:  89mL OMNIPAQUE IOHEXOL 350 MG/ML SOLN COMPARISON:  None. FINDINGS: CTA NECK FINDINGS SKELETON: There is no bony spinal canal stenosis. No lytic or blastic lesion. OTHER NECK: Normal pharynx, larynx and major salivary glands. No cervical lymphadenopathy. Unremarkable thyroid gland. UPPER  CHEST: No pneumothorax or pleural effusion. No nodules or masses. AORTIC ARCH: There is no calcific atherosclerosis of the aortic arch. There is no aneurysm, dissection or hemodynamically significant stenosis of the visualized portion of the aorta. Conventional 3 vessel aortic branching pattern. The visualized proximal subclavian arteries are widely patent. RIGHT CAROTID SYSTEM: Normal without aneurysm, dissection or stenosis. LEFT CAROTID SYSTEM: Normal without aneurysm, dissection or stenosis. VERTEBRAL ARTERIES: Left dominant configuration. Both origins are clearly patent. There is no dissection, occlusion or flow-limiting stenosis to the skull base (V1-V3 segments). CTA HEAD FINDINGS POSTERIOR CIRCULATION: --Vertebral arteries: Normal V4 segments. --Inferior cerebellar arteries: Normal. --Basilar artery: Normal. --Superior cerebellar arteries: Normal. --Posterior cerebral arteries (PCA): Normal. ANTERIOR CIRCULATION: --Intracranial internal carotid arteries: Normal. --Anterior cerebral arteries (ACA): Normal. Both A1 segments are present. Patent anterior communicating artery (a-comm). --Middle cerebral arteries (MCA): Normal. ANATOMIC VARIANTS: None Review of the MIP images confirms the above findings. IMPRESSION: Normal CTA of the head and neck. Electronically Signed   By: Ulyses Jarred M.D.   On: 08/20/2020 22:31   ECHOCARDIOGRAM COMPLETE  Result Date: 08/21/2020    ECHOCARDIOGRAM REPORT   Patient Name:   Morgan Dixon Date of Exam: 08/21/2020 Medical Rec #:  [id number]         Height:       66.0 in Accession #:     [id number]        Weight:       220.0 lb Date of Birth:  08-10-1950         BSA:          2.083 m Patient Age:    38 years          BP:           162/94 mmHg Patient Gender: F                 HR:           64 bpm. Exam Location:  Inpatient Procedure: 2D Echo Indications:    stroke  History:        Patient has no prior history of Echocardiogram examinations.  Sonographer:    Johny Chess Referring Phys: [id number] ASHISH ARORA  Sonographer Comments: No subcostal window. IMPRESSIONS  1. Left ventricular ejection fraction, by estimation, is 60 to 65%. The left ventricle has normal function. The left ventricle has no regional wall motion abnormalities. Left ventricular diastolic parameters were normal.  2. Right ventricular systolic function is normal. The right ventricular size is normal.  3. Left atrial size was mildly dilated.  4. The mitral valve is normal in structure. No evidence of mitral valve regurgitation. No evidence of mitral stenosis.  5. The aortic valve is normal in structure. Aortic valve regurgitation is not visualized. No aortic stenosis is present.  6. The inferior vena cava is normal in size with greater than 50% respiratory variability, suggesting right atrial pressure of 3 mmHg. FINDINGS  Left Ventricle: Left ventricular ejection fraction, by estimation, is 60 to 65%. The left ventricle has normal function. The left ventricle has no regional wall motion abnormalities. The left ventricular internal cavity size was normal in size. There is  no left ventricular hypertrophy. Left ventricular diastolic parameters were normal. Normal left ventricular filling pressure. Right Ventricle: The right ventricular size is normal. No increase in right ventricular wall thickness. Right ventricular systolic function is normal. Left Atrium: Left atrial size was mildly dilated. Right Atrium: Right atrial size was normal in size. Pericardium: There  is no evidence of pericardial effusion. Mitral Valve: The mitral  valve is normal in structure. Mild mitral annular calcification. No evidence of mitral valve regurgitation. No evidence of mitral valve stenosis. Tricuspid Valve: The tricuspid valve is normal in structure. Tricuspid valve regurgitation is not demonstrated. No evidence of tricuspid stenosis. Aortic Valve: The aortic valve is normal in structure. Aortic valve regurgitation is not visualized. No aortic stenosis is present. Pulmonic Valve: The pulmonic valve was normal in structure. Pulmonic valve regurgitation is not visualized. No evidence of pulmonic stenosis. Aorta: The aortic root is normal in size and structure. Venous: The inferior vena cava is normal in size with greater than 50% respiratory variability, suggesting right atrial pressure of 3 mmHg. IAS/Shunts: No atrial level shunt detected by color flow Doppler.  LEFT VENTRICLE PLAX 2D LVIDd:         4.00 cm  Diastology LVIDs:         2.10 cm  LV e' medial:    7.51 cm/s LV PW:         1.00 cm  LV E/e' medial:  11.9 LV IVS:        1.10 cm  LV e' lateral:   10.30 cm/s LVOT diam:     1.90 cm  LV E/e' lateral: 8.7 LV SV:         82 LV SV Index:   39 LVOT Area:     2.84 cm  RIGHT VENTRICLE RV S prime:     18.50 cm/s TAPSE (M-mode): 1.9 cm LEFT ATRIUM             Index       RIGHT ATRIUM           Index LA diam:        4.10 cm 1.97 cm/m  RA Area:     12.50 cm LA Vol (A2C):   61.7 ml 29.63 ml/m RA Volume:   26.40 ml  12.68 ml/m LA Vol (A4C):   56.0 ml 26.89 ml/m LA Biplane Vol: 59.0 ml 28.33 ml/m  AORTIC VALVE LVOT Vmax:   135.00 cm/s LVOT Vmean:  86.900 cm/s LVOT VTI:    0.289 m  AORTA Ao Root diam: 3.00 cm Ao Asc diam:  3.10 cm MITRAL VALVE MV Area (PHT): 2.95 cm    SHUNTS MV Decel Time: 257 msec    Systemic VTI:  0.29 m MV E velocity: 89.40 cm/s  Systemic Diam: 1.90 cm MV A velocity: 88.80 cm/s MV E/A ratio:  1.01 Mihai Croitoru MD Electronically signed by Sanda Klein MD Signature Date/Time: 08/21/2020/1:14:10 PM    Final    CT VENOGRAM HEAD  Result  Date: 08/20/2020 CLINICAL DATA:  Ataxia EXAM: CT VENOGRAM HEAD TECHNIQUE: Venographic phase images of the brain were obtained following the administration of IV contrast agent. CONTRAST:  65mL OMNIPAQUE IOHEXOL 350 MG/ML SOLN COMPARISON:  Head CT with and without contrast 08/20/2020 FINDINGS: Superior sagittal sinus: Normal. Straight sinus: Normal. Inferior sagittal sinus, vein of Galen and internal cerebral veins: Normal. Transverse sinuses: Normal. Sigmoid sinuses: Normal. Visualized jugular veins: Normal. There is leptomeningeal contrast enhancement over the area abnormal density in the left parietal lobe. IMPRESSION: 1. No dural venous sinus thrombosis. 2. Enhancing leptomeningeal contrast over the area of abnormal density in the left parietal lobe, consistent with subacute infarct. Electronically Signed   By: Ulyses Jarred M.D.   On: 08/20/2020 23:03    Scheduled Meds: . amLODipine  5 mg Oral Daily  . aspirin  300 mg Rectal Daily   Or  . aspirin  325 mg Oral Daily  . atorvastatin  40 mg Oral Daily  . clopidogrel  75 mg Oral Q breakfast  . methocarbamol  500 mg Oral TID  . pantoprazole (PROTONIX) IV  40 mg Intravenous Q24H  . polyethylene glycol  17 g Oral Daily  . sertraline  25 mg Oral Daily   Continuous Infusions: . sodium chloride 75 mL/hr at 08/22/20 1400     LOS: 5 days   Marylu Lund, MD [facility] Pager On Amion  If 7PM-7AM, please contact night-coverage 08/22/2020, 6:03 PM

## 2020-08-22 NOTE — Progress Notes (Signed)
Inpatient Rehab Admissions Coordinator Note:   Per CSW request, pt was screened for CIR candidacy by Shann Medal, PT, DPT.  Discussed with OT as well.  At this time we are recommending a CIR consult and I will place an order per our protocol.  Please contact me with questions.   Shann Medal, PT, DPT [phone] 08/22/20 2:09 PM

## 2020-08-23 DIAGNOSIS — N390 Urinary tract infection, site not specified: Secondary | ICD-10-CM | POA: Diagnosis not present

## 2020-08-23 MED ORDER — LISINOPRIL 20 MG PO TABS
20.0000 mg | ORAL_TABLET | Freq: Every day | ORAL | Status: DC
  Administered 2020-08-23 – 2020-08-24 (×2): 20 mg via ORAL
  Filled 2020-08-23 (×2): qty 1

## 2020-08-23 MED ORDER — PANTOPRAZOLE SODIUM 40 MG PO TBEC
40.0000 mg | DELAYED_RELEASE_TABLET | Freq: Every day | ORAL | Status: DC
  Administered 2020-08-23 – 2020-09-04 (×13): 40 mg via ORAL
  Filled 2020-08-23 (×13): qty 1

## 2020-08-23 MED ORDER — NEOMYCIN-POLYMYXIN-HC 3.5-10000-1 OT SUSP
3.0000 [drp] | Freq: Four times a day (QID) | OTIC | Status: DC
  Administered 2020-08-23 – 2020-09-05 (×53): 3 [drp] via OTIC
  Filled 2020-08-23 (×3): qty 10

## 2020-08-23 MED ORDER — CARBAMIDE PEROXIDE 6.5 % OT SOLN
5.0000 [drp] | Freq: Two times a day (BID) | OTIC | Status: DC
  Administered 2020-08-23 – 2020-09-04 (×26): 5 [drp] via OTIC
  Filled 2020-08-23 (×2): qty 15

## 2020-08-23 NOTE — Progress Notes (Signed)
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 for formal Neurosurgical eval -CT head with contrast performed which ruled out mass, but demonstrated L parietal subacute venous infarct vs possible subacute arterial infarct -Neurology has since been consulted -2d echo reviewed, unremarkable -Neurology had been following. Recommendation for aspirin with plavix x 3 weeks followed by plavix alone -Possible CIR is pending -Cardiology consulted. Pt to be set up with patch monitor for AF with plan for possible loop implant as outpatient  2)S/p Prior [place] 2 procedure Now with Possible GI bleed--- -Heme positive stool on admission  -Hgb has remained stable with no evidence of acute blood loss thus far -Earlier discussed case with GI on call. Given concerns of recent stroke, recommendation is to avoid endoscopic w/u until cleared  neurologically or if pt develops acute blood loss - Recommend close outpt f/u with pt's GI provider  3)Left Renal Mass--  If patient cannot have MRI for further evaluation then, recommend follow-up CT the abdomen without and with contrast in 3-6 months to assess for stability.  4)Anterior abdominal wall wound--- please see photos in epic - anterior abdominal wall with clean base dime size open wound  , does Not look infected -WOC was consulted and had recommended small piece of saline moistened gauze into the abdominal wound, cover with dry gauze, tape in place -Remains stable at this time  5)Acute Anemia--  -Hgb trends have remained stable this admission -No evidence of acute blood loss -Repeat CBC in AM  -Please see #2 above  6)UTI ruled out--- low clinical index of suspicion,  -stopped rocephin -Currently remains stable  7)Chronic pain syndrome--previously had a neurostimulator, patient sees pain clinic at [facility] is negative -Hold Lyrica, hold Nucynta, hold Neurontin, -Continue methocarbamol ,stop Zanaflex -Okay to use iv fentanyl as needed for severe pain and oxycodone as needed for moderate pain  8)Acute metabolic encephalopathy--suspect secondary to #1 above  9)Depression and Anxiety--okay to continue Zoloft, may use Xanax as needed  10) social/ethics--Full code  DVT prophylaxis: SCD's Code Status: Full Family Communication: Pt in room, family not at bedside  Status is: Inpatient  Remains inpatient appropriate because:Inpatient level of care appropriate due to severity of illness   Dispo: The patient is from: Home              Anticipated d/c is to: CIR              Patient currently  is not medically stable to d/c.   Difficult to place patient No   Consultants:   GI  Neurosurgery  Neurology  Cardiology  CIR  Procedures:     Antimicrobials: Anti-infectives (From admission, onward)   Start     Dose/Rate Route Frequency Ordered Stop    08/17/20 2130  cefTRIAXone (ROCEPHIN) 1 g in sodium chloride 0.9 % 100 mL IVPB  Status:  Discontinued        1 g 200 mL/hr over 30 Minutes Intravenous Every 24 hours 08/17/20 2115 08/19/20 0955      Subjective: Without complaints currently. In good spirits this AM and hopeful for a good recovery  Objective: Vitals:   08/23/20 0318 08/23/20 0720 08/23/20 1023 08/23/20 1515  BP: (!) 165/65 (!) 181/73 (!) 169/59 (!) 175/75  Pulse: (!) 58 (!) 58 60 68  Resp: 17 16 16 18   Temp: 98.1 F (36.7 C) 98.5 F (36.9 C)  98.8 F (37.1 C)  TempSrc: Oral Oral  Oral  SpO2: 96% 99%  100%  Weight:      Height:        Intake/Output Summary (Last 24 hours) at 08/23/2020 1537 Last data filed at 08/23/2020 1220 Gross per 24 hour  Intake 1600.07 ml  Output 650 ml  Net 950.07 ml   Filed Weights   08/17/20 1352 08/19/20 0328  Weight: 97.5 kg 99.8 kg    Examination: General exam: Conversant, in no acute distress Respiratory system: normal chest rise, clear, no audible wheezing Cardiovascular system: regular rhythm, s1-s2 Gastrointestinal system: Nondistended, nontender, pos BS Central nervous system: No seizures, no tremors, word finding difficulties Extremities: No cyanosis, no joint deformities Skin: No rashes, no pallor Psychiatry: Affect normal // no auditory hallucinations    Data Reviewed: I have personally reviewed following labs and imaging studies  CBC: Recent Labs  Lab 08/17/20 1407 08/18/20 0325 08/20/20 0145 08/21/20 0238 08/22/20 0716  WBC 4.6 4.3 3.5* 3.7* 3.2*  HGB 11.3* 10.3* 10.4* 10.5* 10.5*  HCT 34.1* 31.4* 31.4* 32.1* 32.0*  MCV 89.0 89.5 88.5 88.9 89.6  PLT 147* 141* 137* 151 [id number]*   Basic Metabolic Panel: Recent Labs  Lab 08/17/20 1407 08/18/20 0325 08/20/20 0145 08/21/20 0238 08/22/20 0716  NA 135 136 137 137 139  K 3.3* 3.2* 3.6 3.8 3.6  CL 97* 100 109 105 107  CO2 30 30 24 25 26   GLUCOSE 108* 97 97 98 99  BUN 13 9 <5* 5* 9  CREATININE 0.73 0.74  0.78 0.70 0.77  CALCIUM 8.9 8.5* 8.4* 8.7* 8.9  MG  --  1.8  --   --   --   PHOS  --  3.5  --   --   --    GFR: Estimated Creatinine Clearance: 79.1 mL/min (by C-G formula based on SCr of 0.77 mg/dL). Liver Function Tests: Recent Labs  Lab 08/17/20 1407 08/18/20 0325 08/20/20 0145 08/21/20 0238 08/22/20 0716  AST 76* 64* 44* 32 23  ALT 27 23 23 20 18   ALKPHOS 57 49 48 48 47  BILITOT 2.1* 2.1* 1.4* 0.9 1.1  PROT 6.6 5.7* 5.3* 5.6* 5.6*  ALBUMIN 3.4* 2.9* 2.7* 2.7* 2.8*   No results for input(s): LIPASE, AMYLASE in the last 168 hours. Recent Labs  Lab 08/17/20 1722  AMMONIA 29   Coagulation Profile: Recent Labs  Lab 08/17/20 1407 08/18/20 0325  INR 1.1 1.1   Cardiac Enzymes: No results for input(s): CKTOTAL, CKMB, CKMBINDEX, TROPONINI in the last 168 hours.  BNP (last 3 results) No results for input(s): PROBNP in the last 8760 hours. HbA1C: Recent Labs    08/21/20 0238  HGBA1C 4.9   CBG: Recent Labs  Lab 08/21/20 0614 08/21/20 1236 08/21/20 1748 08/22/20 0008 08/22/20 0621  GLUCAP 106* 112* 128* 151* 103*   Lipid Profile: Recent Labs    08/21/20 0238  CHOL 162  HDL 58  LDLCALC 88  TRIG 81  CHOLHDL 2.8   Thyroid Function Tests: No results for input(s): TSH, T4TOTAL, FREET4, T3FREE, THYROIDAB in the last 72 hours. Anemia Panel: No results for input(s): VITAMINB12, FOLATE, FERRITIN, TIBC, IRON, RETICCTPCT in the last 72 hours. Sepsis Labs: Recent Labs  Lab 08/17/20 1407 08/17/20 1551  LATICACIDVEN 1.3 1.5    Recent Results (from the past 240 hour(s))  Blood culture (routine x 2)     Status: None   Collection Time: 08/17/20  3:51 PM   Specimen: BLOOD RIGHT HAND  Result Value Ref Range Status   Specimen Description BLOOD RIGHT HAND  Final   Special Requests   Final    Blood Culture adequate volume BOTTLES DRAWN AEROBIC AND ANAEROBIC   Culture   Final    NO GROWTH 5 DAYS Performed at [facility], [address], [place], [place]  [address]    Report Status 08/22/2020 FINAL  Final  Blood culture (routine x 2)     Status: None   Collection Time: 08/17/20  3:51 PM   Specimen: BLOOD  Result Value Ref Range Status   Specimen Description BLOOD LEFT ANTECUBITAL  Final   Special Requests   Final    Blood Culture adequate volume BOTTLES DRAWN AEROBIC AND ANAEROBIC   Culture   Final    NO GROWTH 5 DAYS Performed at [facility], [address], [place], [place] [address]    Report Status 08/22/2020 FINAL  Final  Urine culture     Status: None   Collection Time: 08/17/20  9:00 PM   Specimen: Urine, Random  Result Value Ref Range Status   Specimen Description   Final    URINE, RANDOM Performed at [facility], [address], [place], [place] [address]    Special Requests   Final    NONE Performed at [facility], [address] [place], [place] [address]    Culture   Final    NO GROWTH Performed at [facility], [place] [address] [place], [place] [address]    Report Status 08/19/2020 FINAL  Final  SARS CORONAVIRUS 2 (TAT 6-24 HRS) Nasopharyngeal Nasopharyngeal Swab     Status: None   Collection Time: 08/17/20  9:39 PM   Specimen: Nasopharyngeal Swab  Result Value Ref Range Status   SARS Coronavirus 2 NEGATIVE NEGATIVE Final    Comment: (NOTE) SARS-CoV-2 target nucleic acids are NOT DETECTED.  The SARS-CoV-2 RNA is generally detectable in upper and lower respiratory specimens during the acute phase of infection. Negative results do not preclude SARS-CoV-2 infection, do not rule out co-infections with other pathogens, and should not be used as the sole basis for treatment or other patient management decisions. Negative results must be combined with clinical observations, patient history, and epidemiological information. The expected result is Negative.  Fact Sheet for Patients: SugarRoll.be  Fact Sheet for Healthcare  Providers: https://www.woods-mathews.com/  This test is not yet approved or cleared by the [place] FDA and  has been authorized for detection and/or diagnosis of SARS-CoV-2 by FDA under an Emergency Use Authorization (EUA). This EUA  will remain  in effect (meaning this test can be used) for the duration of the COVID-19 declaration under Se ction 564(b)(1) of the Act, 21 U.S.C. section 360bbb-3(b)(1), unless the authorization is terminated or revoked sooner.  Performed at [facility], [place] [address], [place], [place] [address]   MRSA PCR Screening     Status: None   Collection Time: 08/19/20  9:30 AM   Specimen: Nasopharyngeal  Result Value Ref Range Status   MRSA by PCR NEGATIVE NEGATIVE Final    Comment:        The GeneXpert MRSA Assay (FDA approved for NASAL specimens only), is one component of a comprehensive MRSA colonization surveillance program. It is not intended to diagnose MRSA infection nor to guide or monitor treatment for MRSA infections. Performed at [facility], [address], [place], [place] [address]      Radiology Studies: No results found.  Scheduled Meds: . amLODipine  5 mg Oral Daily  . aspirin  300 mg Rectal Daily   Or  . aspirin  325 mg Oral Daily  . atorvastatin  40 mg Oral Daily  . carbamide peroxide  5 drop Both EARS BID  . clopidogrel  75 mg Oral Q breakfast  . lisinopril  20 mg Oral Daily  . methocarbamol  500 mg Oral TID  . neomycin-polymyxin-hydrocortisone  3 drop Left EAR Q6H  . pantoprazole  40 mg Oral QHS  . polyethylene glycol  17 g Oral Daily  . sertraline  25 mg Oral Daily   Continuous Infusions: . sodium chloride 75 mL/hr at 08/23/20 1411     LOS: 6 days   Marylu Lund, MD [facility] Pager On Amion  If 7PM-7AM, please contact night-coverage 08/23/2020, 3:37 PM

## 2020-08-23 NOTE — Evaluation (Signed)
Speech Language Pathology Evaluation Patient Details Name: Morgan Dixon MRN: [id number] DOB: January 02, 1951 Today's Date: 08/23/2020 Time: 5916-3846 SLP Time Calculation (min) (ACUTE ONLY): 35 min  Problem List:  Patient Active Problem List   Diagnosis Date Noted  . Acute metabolic encephalopathy 65/99/3570  . Abdominal pain 08/17/2020  . Altered mental status 08/17/2020  . Acute lower UTI 08/17/2020  . Hypokalemia 08/17/2020  . GI bleed 08/17/2020  . Elevated AST (SGOT) 08/17/2020  . Elevated troponin I level 08/17/2020  . Left renal mass 08/17/2020  . 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 Bilateral lower extremity edema 12/12/2017  . Pain in both lower extremities 12/12/2017   Past Medical History:  Past Medical History:  Diagnosis Date  . Chronic pain   . Edema extremities    Past Surgical History:  Past Surgical History:  Procedure Laterality Date  . ABDOMINAL HYSTERECTOMY  1989  . BACK SURGERY    . GASTRIC BYPASS     HPI:  70 y.o. female with medical history significant for GERD, hypertension, chronic pain, s/p gastric bypass who presents to the emergency department accompanied by a family friend due to altered mental status  With CT head on 08/20/20 indicating stable appearance of cortical and subcortical hypoattenuation in the left posterior frontal and parietal lobe. There is some enhancement the cortex without a discrete mass lesion. Neoplasm is considered unlikely. This is concerning for a venous infarct. Arterial infarct considered less likely. Given the patient cannot have MRI, follow-up CT recommended to evaluate for expected evolution; ST evaluation generated; passed Yale swallow screen.  Assessment / Plan / Recommendation Clinical Impression  Pt administered the SLUMS ([place] Mental Status Examination) with a score of 20/30 obtained with deficits noted in the areas of language, organization and memory.  Potential  deficits within subtests of attention and information processing, but difficult to discern effectively d/t impact of expressive aphasia.  Pt is frustrated stating "the words are mixed up and I can't remember anything."  No family available to obtain baseline cognitive function.  Auditory comprehension appears adequate with directives and functional tasks, but when attempting to answer questions from paragraph, 75% accuracy obtained.  She was able to recall 3/5 objects with a time delay and categorization task with difficulty as well d/t aphasic component.  Pt unable to recall numbers backwards or complete simple calculation task.  Expressive aphasia affecting all tasks, so cognition should continue to be monitored within sessions to discern level of involvement.  ST will f/u for aphasia/cognitive deficits while in acute setting.  Thank you for this consult.    SLP Assessment  SLP Recommendation/Assessment: Patient needs continued Speech Language Pathology Services SLP Visit Diagnosis: Aphasia (R47.01);Attention and concentration deficit;Cognitive communication deficit (R41.841)    Follow Up Recommendations  Other (comment) (TBD)    Frequency and Duration min 2x/week  1 week      SLP Evaluation Cognition  Overall Cognitive Status: No family/caregiver present to determine baseline cognitive functioning Arousal/Alertness: Awake/alert Orientation Level: Oriented X4 Attention: Sustained Sustained Attention: Impaired Sustained Attention Impairment: Verbal basic;Functional basic Memory: Impaired Memory Impairment: Decreased short term memory;Retrieval deficit;Decreased recall of new information Decreased Short Term Memory: Verbal basic;Functional basic Immediate Memory Recall: Sock;Blue;Bed Memory Recall Sock: Without Cue Memory Recall Blue: Without Cue Memory Recall Bed: With Cue Awareness: Appears intact Problem Solving: Appears intact Executive Function: Organizing Organizing:  Impaired Organizing Impairment: Verbal basic;Functional basic Behaviors: Perseveration;Verbal agitation;Other (comment) (anxious) Safety/Judgment: Appears intact  Comprehension  Auditory Comprehension Overall Auditory Comprehension: Appears within functional limits for tasks assessed Yes/No Questions: Within Functional Limits Commands: Within Functional Limits Conversation: Complex Interfering Components: Working Marine scientist;Anxiety EffectiveTechniques: Repetition Visual Recognition/Discrimination Discrimination: Within Function Limits Reading Comprehension Reading Status: Within funtional limits    Expression Expression Primary Mode of Expression: Verbal Verbal Expression Overall Verbal Expression: Impaired Level of Generative/Spontaneous Verbalization: Conversation Repetition: Impaired Level of Impairment: Sentence level Naming: Impairment Responsive: 51-75% accurate Confrontation: Impaired Convergent: 50-74% accurate Divergent: 25-49% accurate Verbal Errors: Perseveration;Semantic paraphasias;Aware of errors;Neologisms Non-Verbal Means of Communication: Not applicable Written Expression Dominant Hand: Right Written Expression: Not tested   Oral / Motor  Oral Motor/Sensory Function Overall Oral Motor/Sensory Function: Within functional limits Motor Speech Overall Motor Speech: Appears within functional limits for tasks assessed Respiration: Within functional limits Phonation: Normal Resonance: Within functional limits Articulation: Within functional limitis Intelligibility: Intelligible Motor Planning: Witnin functional limits Motor Speech Errors: Not applicable                       [name], M.S., CCC-SLP 08/23/2020, 3:32 PM

## 2020-08-23 NOTE — Progress Notes (Signed)
Physical Therapy Treatment Patient Details Name: Morgan Dixon MRN: [id number] DOB: 12-28-1950 Today's Date: 08/23/2020    History of Present Illness Pt is 70 yo female who presents to [facility] with AMS and dysphasia with CT showing L parietal hypodensity concerning for CVA vs mass as well as L renal mass. Pt also with abdominal wound and possible GI bleed. PMH: gastric bypass surgery, HTN, chronic back pain with spinal cord stimulator.    PT Comments    Pt progressing towards goals, however, complaining of intense bilateral ear pain which limited mobility. Pt reporting pain was worse with mobility, and was limited to side stepping at EOB. Requiring min A for transfers and gait. Was agreeable to performing supine HEP as well. Notified RN of ear ache. Feel pt will progress well once pain controlled. Feel she would benefit from CIR level therapies to increase independence and safety. Recommendations updated. Will continue to follow acutely.     Follow Up Recommendations  CIR;Supervision for mobility/OOB     Equipment Recommendations  None recommended by PT    Recommendations for Other Services       Precautions / Restrictions Precautions Precautions: Fall Restrictions Weight Bearing Restrictions: No    Mobility  Bed Mobility Overal bed mobility: Needs Assistance Bed Mobility: Supine to Sit;Sit to Supine     Supine to sit: Supervision Sit to supine: Supervision   General bed mobility comments: Supervision for safety    Transfers Overall transfer level: Needs assistance Equipment used: Rolling walker (2 wheeled) Transfers: Sit to/from Stand Sit to Stand: Min assist         General transfer comment: Min A for steadying assist to stand at EOB.  Ambulation/Gait Ambulation/Gait assistance: Min assist   Assistive device: Rolling walker (2 wheeled)   Gait velocity: Pt complaining of bilateral ear ache that was worse in standing, so was only able to side step at EOB. Min  A For steadying assist.       Stairs             Wheelchair Mobility    Modified Rankin (Stroke Patients Only)       Balance Overall balance assessment: Needs assistance Sitting-balance support: No upper extremity supported;Feet supported Sitting balance-Leahy Scale: Fair     Standing balance support: No upper extremity supported;During functional activity Standing balance-Leahy Scale: Poor Standing balance comment: Reliant on UE support for balance.                            Cognition Arousal/Alertness: Awake/alert Behavior During Therapy: WFL for tasks assessed/performed Overall Cognitive Status: Within Functional Limits for tasks assessed                                        Exercises General Exercises - Lower Extremity Ankle Circles/Pumps: AROM;Both;20 reps Quad Sets: AROM;Both;10 reps;Supine Heel Slides: AROM;Both;10 reps;Supine    General Comments        Pertinent Vitals/Pain Pain Assessment: 0-10 Pain Score: 10-Worst pain ever Pain Location: bilateral ear ache (L>R) Pain Descriptors / Indicators: Grimacing;Aching Pain Intervention(s): Limited activity within patient's tolerance;Monitored during session;Repositioned    Home Living                      Prior Function            PT Goals (current goals can  now be found in the care plan section) Acute Rehab PT Goals Patient Stated Goal: return home able to take care of self and her dog, Berline Lopes PT Goal Formulation: With patient Time For Goal Achievement: 09/02/20 Potential to Achieve Goals: Good Progress towards PT goals: Progressing toward goals    Frequency    Min 4X/week      PT Plan Discharge plan needs to be updated    Co-evaluation              AM-PAC PT "6 Clicks" Mobility   Outcome Measure  Help needed turning from your back to your side while in a flat bed without using bedrails?: A Little Help needed moving from lying on your  back to sitting on the side of a flat bed without using bedrails?: A Little Help needed moving to and from a bed to a chair (including a wheelchair)?: A Little Help needed standing up from a chair using your arms (e.g., wheelchair or bedside chair)?: A Little Help needed to walk in hospital room?: A Little Help needed climbing 3-5 steps with a railing? : A Lot 6 Click Score: 17    End of Session Equipment Utilized During Treatment: Gait belt Activity Tolerance: Patient limited by pain (bilateral ear ache) Patient left: in bed;with call bell/phone within reach;with bed alarm set Nurse Communication: Mobility status;Other (comment) (pt complaining of bilateral ear ache that was worse with mobility) PT Visit Diagnosis: Unsteadiness on feet (R26.81);Other abnormalities of gait and mobility (R26.89);Muscle weakness (generalized) (M62.81)     Time: 0932-6712 PT Time Calculation (min) (ACUTE ONLY): 12 min  Charges:  $Therapeutic Activity: 8-22 mins                     Morgan Dixon, Morgan Dixon  Acute Rehabilitation Services  Pager: [phone] Office: [phone]    Morgan Dixon 08/23/2020, 10:05 AM

## 2020-08-23 NOTE — Progress Notes (Signed)
Inpatient Rehabilitation Admissions Coordinator  I met at bedside for rehab assessment and then spoke with her daughter , [name], by phone. Patient has no caregiver supports at home therefore we are recommending SNF and daughter also wishes for SNF in the [place], [place] or [place] area. I will alert acute team and TOC. We will sign off.  Danne Baxter, RN, MSN Rehab Admissions Coordinator [phone] 08/23/2020 1:20 PM

## 2020-08-24 DIAGNOSIS — N2889 Other specified disorders of kidney and ureter: Secondary | ICD-10-CM | POA: Diagnosis not present

## 2020-08-24 DIAGNOSIS — K922 Gastrointestinal hemorrhage, unspecified: Secondary | ICD-10-CM | POA: Diagnosis not present

## 2020-08-24 DIAGNOSIS — I639 Cerebral infarction, unspecified: Secondary | ICD-10-CM | POA: Diagnosis present

## 2020-08-24 LAB — COMPREHENSIVE METABOLIC PANEL
ALT: 15 U/L (ref 0–44)
AST: 19 U/L (ref 15–41)
Albumin: 2.8 g/dL — ABNORMAL LOW (ref 3.5–5.0)
Alkaline Phosphatase: 51 U/L (ref 38–126)
Anion gap: 8 (ref 5–15)
BUN: 8 mg/dL (ref 8–23)
CO2: 24 mmol/L (ref 22–32)
Calcium: 8.8 mg/dL — ABNORMAL LOW (ref 8.9–10.3)
Chloride: 105 mmol/L (ref 98–111)
Creatinine, Ser: 0.89 mg/dL (ref 0.44–1.00)
GFR, Estimated: >60 mL/min (ref >60)
Glucose, Bld: 116 mg/dL — ABNORMAL HIGH (ref 70–99)
Potassium: 3.4 mmol/L — ABNORMAL LOW (ref 3.5–5.1)
Sodium: 137 mmol/L (ref 135–145)
Total Bilirubin: 1.1 mg/dL (ref 0.3–1.2)
Total Protein: 5.6 g/dL — ABNORMAL LOW (ref 6.5–8.1)

## 2020-08-24 LAB — GLUCOSE, CAPILLARY
Glucose-Capillary: 100 mg/dL — ABNORMAL HIGH (ref 70–99)
Glucose-Capillary: 111 mg/dL — ABNORMAL HIGH (ref 70–99)
Glucose-Capillary: 124 mg/dL — ABNORMAL HIGH (ref 70–99)
Glucose-Capillary: 97 mg/dL (ref 70–99)

## 2020-08-24 LAB — CBC
HCT: 31.7 % — ABNORMAL LOW (ref 36.0–46.0)
Hemoglobin: 10.4 g/dL — ABNORMAL LOW (ref 12.0–15.0)
MCH: 29.1 pg (ref 26.0–34.0)
MCHC: 32.8 g/dL (ref 30.0–36.0)
MCV: 88.8 fL (ref 80.0–100.0)
Platelets: 164 10*3/uL (ref 150–400)
RBC: 3.57 MIL/uL — ABNORMAL LOW (ref 3.87–5.11)
RDW: 15.4 % (ref 11.5–15.5)
WBC: 3.5 10*3/uL — ABNORMAL LOW (ref 4.0–10.5)
nRBC: 0 % (ref 0.0–0.2)

## 2020-08-24 MED ORDER — BUPROPION HCL ER (SR) 150 MG PO TB12
300.0000 mg | ORAL_TABLET | Freq: Every day | ORAL | Status: DC
  Administered 2020-08-24 – 2020-09-02 (×10): 300 mg via ORAL
  Filled 2020-08-24 (×10): qty 2

## 2020-08-24 MED ORDER — SERTRALINE HCL 50 MG PO TABS: 25.0000 mg | ORAL_TABLET | Freq: Every day | ORAL | Status: DC

## 2020-08-24 MED ORDER — POTASSIUM CHLORIDE CRYS ER 20 MEQ PO TBCR
40.0000 meq | EXTENDED_RELEASE_TABLET | Freq: Once | ORAL | Status: AC
  Administered 2020-08-24: 40 meq via ORAL
  Filled 2020-08-24: qty 2

## 2020-08-24 MED ORDER — PREGABALIN 100 MG PO CAPS
200.0000 mg | ORAL_CAPSULE | Freq: Two times a day (BID) | ORAL | Status: DC
  Administered 2020-08-24 – 2020-09-05 (×24): 200 mg via ORAL
  Filled 2020-08-24 (×24): qty 2

## 2020-08-24 MED ORDER — BUPROPION HCL ER (SR) 150 MG PO TB12
300.0000 mg | ORAL_TABLET | Freq: Two times a day (BID) | ORAL | Status: DC
  Filled 2020-08-24: qty 2

## 2020-08-24 NOTE — Progress Notes (Signed)
Occupational Therapy Treatment Patient Details Name: Morgan Dixon MRN: [id number] DOB: 1950-07-14 Today's Date: 08/24/2020    History of present illness Pt is 70 yo female who presents to [facility] with AMS and dysphasia with CT showing L parietal hypodensity concerning for CVA vs mass as well as L renal mass. Pt also with abdominal wound and possible GI bleed. PMH: gastric bypass surgery, HTN, chronic back pain with spinal cord stimulator.   OT comments  Pt progressing towards acute OT goals. Needs rw for OOB activity. Pt expressed feeling frustrated by wording difficulties. D/c plan updated to SNF.    Follow Up Recommendations  SNF   Equipment Recommendations  3 in 1 bedside commode    Recommendations for Other Services Speech consult    Precautions / Restrictions Precautions Precautions: Fall Restrictions Weight Bearing Restrictions: No       Mobility Bed Mobility Overal bed mobility: Needs Assistance Bed Mobility: Supine to Sit     Supine to sit: Supervision Sit to supine: Supervision   General bed mobility comments: Supervision for safety    Transfers Overall transfer level: Needs assistance Equipment used: Rolling walker (2 wheeled) Transfers: Sit to/from Stand Sit to Stand: Min guard         General transfer comment: to/from recliner.    Balance Overall balance assessment: Needs assistance Sitting-balance support: No upper extremity supported;Feet supported Sitting balance-Leahy Scale: Fair     Standing balance support: During functional activity;Bilateral upper extremity supported Standing balance-Leahy Scale: Poor Standing balance comment: Reliant on UE support for balance.                           ADL either performed or assessed with clinical judgement   ADL Overall ADL's : Needs assistance/impaired     Grooming: Wash/dry hands;Min guard;Standing                               Functional mobility during ADLs: Min  guard;Rolling walker General ADL Comments: min guard for safety using rw to walk in the room. single UE support for grooming task in standing     Vision       Perception     Praxis      Cognition Arousal/Alertness: Awake/alert Behavior During Therapy: WFL for tasks assessed/performed Overall Cognitive Status: No family/caregiver present to determine baseline cognitive functioning                                 General Comments: word finding difficulty vs slow processing        Exercises Exercises: General Lower Extremity General Exercises - Lower Extremity Long Arc Quad: AROM;Both;10 reps;Seated Hip Flexion/Marching: AROM;Both;10 reps;Seated Toe Raises: AROM;Both;10 reps;Seated Heel Raises: AROM;Both;10 reps;Seated   Shoulder Instructions       General Comments      Pertinent Vitals/ Pain       Pain Assessment: Faces Faces Pain Scale: Hurts whole lot Pain Location: abdomen at end of session. earache Pain Descriptors / Indicators: Grimacing;Guarding Pain Intervention(s): Patient requesting pain meds-RN notified;Monitored during session;Limited activity within patient's tolerance;Repositioned  Home Living                                          Prior Functioning/Environment  Frequency  Min 2X/week        Progress Toward Goals  OT Goals(current goals can now be found in the care plan section)  Progress towards OT goals: Progressing toward goals  Acute Rehab OT Goals Patient Stated Goal: return home able to take care of self and her dog, Morgan Dixon OT Goal Formulation: With patient Time For Goal Achievement: 09/05/20 Potential to Achieve Goals: Good ADL Goals Pt Will Perform Grooming: with modified independence;standing Pt Will Perform Lower Body Dressing: with modified independence;sit to/from stand Pt Will Transfer to Toilet: with modified independence;ambulating;regular height toilet Pt Will Perform  Toileting - Clothing Manipulation and hygiene: with modified independence;sitting/lateral leans;sit to/from stand Additional ADL Goal #1: Pt will demonstrate increased activity tolerance to perform three grooming standing at sink with Supervision  Plan Discharge plan remains appropriate    Co-evaluation                 AM-PAC OT "6 Clicks" Daily Activity     Outcome Measure   Help from another person eating meals?: None Help from another person taking care of personal grooming?: A Little Help from another person toileting, which includes using toliet, bedpan, or urinal?: A Little Help from another person bathing (including washing, rinsing, drying)?: A Little Help from another person to put on and taking off regular upper body clothing?: A Little Help from another person to put on and taking off regular lower body clothing?: A Lot 6 Click Score: 18    End of Session Equipment Utilized During Treatment: Rolling walker  OT Visit Diagnosis: Other abnormalities of gait and mobility (R26.89);Unsteadiness on feet (R26.81);Muscle weakness (generalized) (M62.81)   Activity Tolerance Patient tolerated treatment well;Patient limited by pain (c/o abdominal pain and requested pain med at end of session)   Patient Left in bed;with call bell/phone within reach;with bed alarm set   Nurse Communication Patient requests pain meds        Time: 7948-0165 OT Time Calculation (min): 19 min  Charges: OT General Charges $OT Visit: 1 Visit OT Treatments $Self Care/Home Management : 8-22 mins  Tyrone Schimke, OT Acute Rehabilitation Services Pager: [phone] Office: [place], [place] 08/24/2020, 3:05 PM

## 2020-08-24 NOTE — Plan of Care (Signed)

## 2020-08-24 NOTE — Progress Notes (Signed)
Physical Therapy Treatment Patient Details Name: Morgan Dixon MRN: [id number] DOB: [birth year] Today's Date: 08/24/2020    History of Present Illness Pt is 70 yo female who presents to [facility] with AMS and dysphasia with CT showing L parietal hypodensity concerning for CVA vs mass as well as L renal mass. Pt also with abdominal wound and possible GI bleed. PMH: gastric bypass surgery, HTN, chronic back pain with spinal cord stimulator.    PT Comments    Patient progressing with mobility this session however demonstrate decreased awareness for safety and deficits. Patient with increased gait speed requiring minA for balance primarily during obstacle navigation. Updated discharge recommendation to SNF as patient does not have supervision/assistance at home.     Follow Up Recommendations  SNF;Supervision for mobility/OOB     Equipment Recommendations  None recommended by PT    Recommendations for Other Services       Precautions / Restrictions Precautions Precautions: Fall Restrictions Weight Bearing Restrictions: No    Mobility  Bed Mobility Overal bed mobility: Needs Assistance Bed Mobility: Supine to Sit     Supine to sit: Supervision     General bed mobility comments: Supervision for safety    Transfers Overall transfer level: Needs assistance Equipment used: Rolling Nayellie Sanseverino (2 wheeled) Transfers: Sit to/from Stand Sit to Stand: Min assist         General transfer comment: minA for boost up and steady  Ambulation/Gait Ambulation/Gait assistance: Min assist Gait Distance (Feet): 20 Feet Assistive device: Rolling Wofford Stratton (2 wheeled) Gait Pattern/deviations: Decreased stride length;Step-through pattern;Trunk flexed;Wide base of support     General Gait Details: increased speed with minA for balance. Difficulty navigating around objects with RW   Stairs             Wheelchair Mobility    Modified Rankin (Stroke Patients Only) Modified Rankin  (Stroke Patients Only) Pre-Morbid Rankin Score: No symptoms Modified Rankin: Moderately severe disability     Balance Overall balance assessment: Needs assistance Sitting-balance support: No upper extremity supported;Feet supported Sitting balance-Leahy Scale: Fair     Standing balance support: During functional activity;Bilateral upper extremity supported Standing balance-Leahy Scale: Poor Standing balance comment: Reliant on UE support for balance.                            Cognition Arousal/Alertness: Awake/alert Behavior During Therapy: WFL for tasks assessed/performed Overall Cognitive Status: No family/caregiver present to determine baseline cognitive functioning                                 General Comments: Appears to have decreased awareness of safety and deficits during mobility      Exercises General Exercises - Lower Extremity Long Arc Quad: AROM;Both;10 reps;Seated Hip Flexion/Marching: AROM;Both;10 reps;Seated Toe Raises: AROM;Both;10 reps;Seated Heel Raises: AROM;Both;10 reps;Seated    General Comments        Pertinent Vitals/Pain Pain Assessment: Faces Faces Pain Scale: Hurts even more Pain Location: earache Pain Descriptors / Indicators: Discomfort Pain Intervention(s): Monitored during session;Repositioned    Home Living                      Prior Function            PT Goals (current goals can now be found in the care plan section) Acute Rehab PT Goals Patient Stated Goal: return home able to take  care of self and her dog, Morgan Dixon PT Goal Formulation: With patient Time For Goal Achievement: 09/02/20 Potential to Achieve Goals: Good Progress towards PT goals: Progressing toward goals    Frequency    Min 4X/week      PT Plan Discharge plan needs to be updated    Co-evaluation              AM-PAC PT "6 Clicks" Mobility   Outcome Measure  Help needed turning from your back to your side  while in a flat bed without using bedrails?: A Little Help needed moving from lying on your back to sitting on the side of a flat bed without using bedrails?: A Little Help needed moving to and from a bed to a chair (including a wheelchair)?: A Little Help needed standing up from a chair using your arms (e.g., wheelchair or bedside chair)?: A Little Help needed to walk in hospital room?: A Little Help needed climbing 3-5 steps with a railing? : A Lot 6 Click Score: 17    End of Session Equipment Utilized During Treatment: Gait belt Activity Tolerance: Patient tolerated treatment well Patient left: in chair;with call bell/phone within reach (notified RN about chair alarm not working) Nurse Communication: Mobility status;Other (comment) (chair alarm not functioning properly) PT Visit Diagnosis: Unsteadiness on feet (R26.81);Other abnormalities of gait and mobility (R26.89);Muscle weakness (generalized) (M62.81)     Time: 8413-2440 PT Time Calculation (min) (ACUTE ONLY): 17 min  Charges:  $Therapeutic Activity: 8-22 mins                     Jonaya Freshour A. Gilford Rile PT, DPT Acute Rehabilitation Services Pager [phone] Office [phone]    Linna Hoff 08/24/2020, 2:34 PM

## 2020-08-24 NOTE — Progress Notes (Signed)
PROGRESS NOTE    Morgan Dixon   TDV:[id number]  DOB: 07-[name]-1951  DOA: 08/17/2020 PCP: Alfonse Flavors, MD   Brief Narrative:  Morgan Dixon is a 70 year old female who is status post gastric bypass and has hypertension and chronic back pain who presented to the hospital after community health nurse noted her to be confused.  She was found to have a left parietal subacute infarct which is thought to be the cause for her confusion which has since resolved.   Subjective: Complaints.  She states she is ready for discharge whenever bed is available.    Assessment & Plan:   Principal Problem:   CVA (cerebral vascular accident) , suspected acute metabolic encephalopathy -Initial CT of the head> Hypoattenuation left temporoparietal lobe has an appearance worrisome for neoplasm. - Unable to have an MRI due to spinal stimulator - - CT venogram > Enhancing leptomeningeal contrast over the area of abnormal density in the left parietal lobe, consistent with subacute infarct.  - 2 D ECHO> no thrombus -Neurology feels CVA may be embolic and recommends a loop recorder (which EP will do as outpatient) and aspirin and Plavix for 3 weeks followed by Plavix alone - LDL 88, goal less than 70- statin has been started - Plan is for her to transition to skilled nursing facility for rehab  Greater than a mass in the left parietal area  Active Problems: Hypertension - Home blood pressure meds include  HCTZ and Catapres - Currently receiving amlodipine and lisinopril  Heme positive stool -History of Billroth II procedure - GI was consulted while the patient was at [facility] and preferred not to do a work-up in the setting of acute neurological issue - She is now on aspirin and Plavix which should not be discontinued -Hemoglobin has remained stable since admission - We will continue to check stool Hemoccults-I have ordered it  Left renal mass -CT abd/pelvis > Small left  renal cortical mass, posteromedial midpole, which is larger than prior exam. This could reflect a small renal neoplasm. - Recommend CT abdomen with and without contrast in 3 to 6 months to assess this-patient is unable to have an MRI  Anterior abdominal wall wound - Continue wound care recommendations  Chronic pain - can continue Methocarbamol 500 mg 3 times daily and as needed oxycodone for now -We will resume Lyrica which is a home med and see if we can wean off of oxycodone  Anxiety - Resume Zoloft  - Wellbutrin being resumed at 1/2 dose (was on 300 BID) to prevent withdrawal symptoms- as it can increase seizure risk, it should be stopped but will need to be weaned off  Time spent in minutes: 35 DVT prophylaxis: SCDs Start: 08/18/20 0050  Code Status: Full code Family Communication:  Level of Care: Level of care: Med-Surg Disposition Plan:  Status is: Inpatient  Remains inpatient appropriate because:Unsafe d/c plan   Dispo: The patient is from: Home              Anticipated d/c is to: SNF              Patient currently is medically stable to d/c.   Difficult to place patient Yes      Consultants:   neurology Procedures:   none Antimicrobials:  Anti-infectives (From admission, onward)   Start     Dose/Rate Route Frequency Ordered Stop   08/17/20 2130  cefTRIAXone (ROCEPHIN) 1 g in sodium chloride 0.9 % 100 mL IVPB  Status:  Discontinued        1 g 200 mL/hr over 30 Minutes Intravenous Every 24 hours 08/17/20 2115 08/19/20 0955       Objective: Vitals:   08/24/20 0029 08/24/20 0338 08/24/20 0837 08/24/20 1213  BP: (!) 157/60 (!) 161/69 (!) 148/107 127/67  Pulse: 62 63 63 61  Resp: 19 15 16 16   Temp: 98.6 F (37 C) 97.6 F (36.4 C) 98.9 F (37.2 C) 98.3 F (36.8 C)  TempSrc: Oral  Oral Oral  SpO2: 98% 97% 97% 98%  Weight:      Height:        Intake/Output Summary (Last 24 hours) at 08/24/2020 1502 Last data filed at 08/24/2020 0900 Gross per 24  hour  Intake 2478.36 ml  Output 1700 ml  Net 778.36 ml   Filed Weights   08/17/20 1352 08/19/20 0328  Weight: 97.5 kg 99.8 kg    Examination: General exam: Appears comfortable  HEENT: PERRLA, oral mucosa moist, no sclera icterus or thrush Respiratory system: Clear to auscultation. Respiratory effort normal. Cardiovascular system: S1 & S2 heard, RRR.   Gastrointestinal system: Abdomen soft, non-tender, nondistended. Normal bowel sounds. Central nervous system: Alert and oriented. No focal neurological deficits. Extremities: No cyanosis, clubbing or edema Skin: No rashes or ulcers Psychiatry:  Mood & affect appropriate.     Data Reviewed: I have personally reviewed following labs and imaging studies  CBC: Recent Labs  Lab 08/18/20 0325 08/20/20 0145 08/21/20 0238 08/22/20 0716 08/24/20 0307  WBC 4.3 3.5* 3.7* 3.2* 3.5*  HGB 10.3* 10.4* 10.5* 10.5* 10.4*  HCT 31.4* 31.4* 32.1* 32.0* 31.7*  MCV 89.5 88.5 88.9 89.6 88.8  PLT 141* 137* 151 145* 518   Basic Metabolic Panel: Recent Labs  Lab 08/18/20 0325 08/20/20 0145 08/21/20 0238 08/22/20 0716 08/24/20 0307  NA 136 137 137 139 137  K 3.2* 3.6 3.8 3.6 3.4*  CL 100 109 105 107 105  CO2 30 24 25 26 24   GLUCOSE 97 97 98 99 116*  BUN 9 <5* 5* 9 8  CREATININE 0.74 0.78 0.70 0.77 0.89  CALCIUM 8.5* 8.4* 8.7* 8.9 8.8*  MG 1.8  --   --   --   --   PHOS 3.5  --   --   --   --    GFR: Estimated Creatinine Clearance: 71.1 mL/min (by C-G formula based on SCr of 0.89 mg/dL). Liver Function Tests: Recent Labs  Lab 08/18/20 0325 08/20/20 0145 08/21/20 0238 08/22/20 0716 08/24/20 0307  AST 64* 44* 32 23 19  ALT 23 23 20 18 15   ALKPHOS 49 48 48 47 51  BILITOT 2.1* 1.4* 0.9 1.1 1.1  PROT 5.7* 5.3* 5.6* 5.6* 5.6*  ALBUMIN 2.9* 2.7* 2.7* 2.8* 2.8*   No results for input(s): LIPASE, AMYLASE in the last 168 hours. Recent Labs  Lab 08/17/20 1722  [place] 29   Coagulation Profile: Recent Labs  Lab 08/18/20 0325   INR 1.1   Cardiac Enzymes: No results for input(s): CKTOTAL, CKMB, CKMBINDEX, TROPONINI in the last 168 hours. BNP (last 3 results) No results for input(s): PROBNP in the last 8760 hours. HbA1C: No results for input(s): HGBA1C in the last 72 hours. CBG: Recent Labs  Lab 08/22/20 0008 08/22/20 0621 08/24/20 0025 08/24/20 0624 08/24/20 1211  GLUCAP 151* 103* 124* 111* 100*   Lipid Profile: No results for input(s): CHOL, HDL, LDLCALC, TRIG, CHOLHDL, LDLDIRECT in the last 72 hours. Thyroid Function Tests: No results for  input(s): TSH, T4TOTAL, FREET4, T3FREE, THYROIDAB in the last 72 hours. Anemia Panel: No results for input(s): VITAMINB12, FOLATE, FERRITIN, TIBC, IRON, RETICCTPCT in the last 72 hours. Urine analysis:    Component Value Date/Time   COLORURINE YELLOW 08/17/2020 2100   APPEARANCEUR CLEAR 08/17/2020 2100   LABSPEC 1.042 (H) 08/17/2020 2100   PHURINE 6.0 08/17/2020 2100   GLUCOSEU NEGATIVE 08/17/2020 2100   HGBUR SMALL (A) 08/17/2020 2100   [place] NEGATIVE 08/17/2020 2100   [place] NEGATIVE 08/17/2020 2100   PROTEINUR NEGATIVE 08/17/2020 2100   NITRITE NEGATIVE 08/17/2020 2100   LEUKOCYTESUR NEGATIVE 08/17/2020 2100   Sepsis Labs: @LABRCNTIP (procalcitonin:4,lacticidven:4) ) Recent Results (from the past 240 hour(s))  Blood culture (routine x 2)     Status: None   Collection Time: 08/17/20  3:51 PM   Specimen: BLOOD RIGHT HAND  Result Value Ref Range Status   Specimen Description BLOOD RIGHT HAND  Final   Special Requests   Final    Blood Culture adequate volume BOTTLES DRAWN AEROBIC AND ANAEROBIC   Culture   Final    NO GROWTH 5 DAYS Performed at [facility], [address], [place], [place] [address]    Report Status 08/22/2020 FINAL  Final  Blood culture (routine x 2)     Status: None   Collection Time: 08/17/20  3:51 PM   Specimen: BLOOD  Result Value Ref Range Status   Specimen Description BLOOD LEFT ANTECUBITAL  Final   Special  Requests   Final    Blood Culture adequate volume BOTTLES DRAWN AEROBIC AND ANAEROBIC   Culture   Final    NO GROWTH 5 DAYS Performed at [facility], [address], [place], [place] [address]    Report Status 08/22/2020 FINAL  Final  Urine culture     Status: None   Collection Time: 08/17/20  9:00 PM   Specimen: Urine, Random  Result Value Ref Range Status   Specimen Description   Final    URINE, RANDOM Performed at [facility], [address] [place], [place] [address]    Special Requests   Final    NONE Performed at [facility], [address] [place], [place] [address]    Culture   Final    NO GROWTH Performed at [facility], [place] [address] [place], [place] [address]    Report Status 08/19/2020 FINAL  Final  SARS CORONAVIRUS 2 (TAT 6-24 HRS) Nasopharyngeal Nasopharyngeal Swab     Status: None   Collection Time: 08/17/20  9:39 PM   Specimen: Nasopharyngeal Swab  Result Value Ref Range Status   SARS Coronavirus 2 NEGATIVE NEGATIVE Final    Comment: (NOTE) SARS-CoV-2 target nucleic acids are NOT DETECTED.  The SARS-CoV-2 RNA is generally detectable in upper and lower respiratory specimens during the acute phase of infection. Negative results do not preclude SARS-CoV-2 infection, do not rule out co-infections with other pathogens, and should not be used as the sole basis for treatment or other patient management decisions. Negative results must be combined with clinical observations, patient history, and epidemiological information. The expected result is Negative.  Fact Sheet for Patients: SugarRoll.be  Fact Sheet for Healthcare Providers: https://www.woods-mathews.com/  This test is not yet approved or cleared by the [place] FDA and  has been authorized for detection and/or diagnosis of SARS-CoV-2 by FDA under an Emergency Use Authorization (EUA). This EUA will remain  in effect (meaning this test can be used)  for the duration of the COVID-19 declaration  under Se ction 564(b)(1) of the Act, 21 U.S.C. section 360bbb-3(b)(1), unless the authorization is terminated or revoked sooner.  Performed at [facility], [place] [address], [place], [place] [address]   MRSA PCR Screening     Status: None   Collection Time: 08/19/20  9:30 AM   Specimen: Nasopharyngeal  Result Value Ref Range Status   MRSA by PCR NEGATIVE NEGATIVE Final    Comment:        The GeneXpert MRSA Assay (FDA approved for NASAL specimens only), is one component of a comprehensive MRSA colonization surveillance program. It is not intended to diagnose MRSA infection nor to guide or monitor treatment for MRSA infections. Performed at [facility], [address] [place], [place] [address]          Radiology Studies: No results found.    Scheduled Meds: . amLODipine  5 mg Oral Daily  . aspirin  300 mg Rectal Daily   Or  . aspirin  325 mg Oral Daily  . atorvastatin  40 mg Oral Daily  . carbamide peroxide  5 drop Both EARS BID  . clopidogrel  75 mg Oral Q breakfast  . lisinopril  20 mg Oral Daily  . methocarbamol  500 mg Oral TID  . neomycin-polymyxin-hydrocortisone  3 drop Left EAR Q6H  . pantoprazole  40 mg Oral QHS  . polyethylene glycol  17 g Oral Daily  . sertraline  25 mg Oral Daily   Continuous Infusions:   LOS: 7 days      Debbe Odea, MD [facility] Pager: [email or web address] 08/24/2020, 3:02 PM

## 2020-08-24 NOTE — TOC Progression Note (Signed)
Transition of Care [facility]) - Progression Note    Patient Details  Name: Morgan Dixon MRN: [id number] Date of Birth: 1951/03/25  Transition of Care [facility]) CM/SW [place], [place] Phone Number: 08/24/2020, 10:53 AM  Clinical Narrative:   CSW following for discharge plan. There is still a secondary payer source blocking patient's Medicare for post-acute care, per SNF referrals. CSW unable to obtain any information on clearing that. CSW reached out to [place] for assistance, suggested to reach out to financial counseling or patient accounts. CSW sent a message to financial counselor about trying to identify any additional information to be able to place patient in SNF. CSW to follow.    Expected Discharge Plan: [place] Barriers to Discharge: Continued Medical Work up,Insurance Authorization  Expected Discharge Plan and Services Expected Discharge Plan: [place] In-house Referral: Clinical Social Work   Post Acute Care Choice: [place] Living arrangements for the past 2 months: Single Family Home                                       Social Determinants of Health (SDOH) Interventions    Readmission Risk Interventions Readmission Risk Prevention Plan 08/19/2020  Medication Screening Complete  Transportation Screening Complete  Some recent data might be hidden

## 2020-08-25 DIAGNOSIS — N2889 Other specified disorders of kidney and ureter: Secondary | ICD-10-CM | POA: Diagnosis not present

## 2020-08-25 LAB — GLUCOSE, CAPILLARY
Glucose-Capillary: 157 mg/dL — ABNORMAL HIGH (ref 70–99)
Glucose-Capillary: 92 mg/dL (ref 70–99)
Glucose-Capillary: 97 mg/dL (ref 70–99)

## 2020-08-25 MED ORDER — ACETAMINOPHEN 500 MG PO TABS
1000.0000 mg | ORAL_TABLET | Freq: Four times a day (QID) | ORAL | Status: DC | PRN
  Administered 2020-08-25 – 2020-08-31 (×17): 1000 mg via ORAL
  Filled 2020-08-25 (×17): qty 2

## 2020-08-25 MED ORDER — TRAMADOL HCL 50 MG PO TABS: 100.0000 mg | ORAL_TABLET | Freq: Four times a day (QID) | ORAL | Status: DC | PRN

## 2020-08-25 NOTE — Progress Notes (Signed)
  Speech Language Pathology Treatment: Cognitive-Linquistic  Patient Details Name: Morgan Dixon MRN: [id number] DOB: 02-14-1951 Today's Date: 08/25/2020 Time: 1020-1049 SLP Time Calculation (min) (ACUTE ONLY): 29 min  Assessment / Plan / Recommendation Clinical Impression  Pt seen for aphasia tx with focus on confrontational naming tasks, education re: word finding difficulties, divergent/convergent naming tasks and use of compensatory strategies within speaking tasks. Pt educated re: using circumlocution, carrier phrases such as "I need a..", gesturing, writing needs down using graphic expression, etc. To decrease frustration with communication.  Pt completed divergent tasks with 70% accuracy given phonemic/semantic cues, carrier phrases and extended time to answer information/questions.  Confrontational naming with above cues with 90% accuracy obtained with familiar/unfamiliar objects.  Convergent tasks with 72% accuracy overall.  [name] anxiety/frustration decreased as session went on with compensatory strategies utilized to communicate wants/needs within session.  ST recommended to address expressive aphasia and provide continued education re: compensatory strategies to use within functional communicative situations.  ST will continue to address above needs in acute setting.    HPI HPI: 70 y.o. female with medical history significant for GERD, hypertension, chronic pain, s/p gastric bypass who presents to the emergency department accompanied by a family friend due to altered mental status      SLP Plan  Continue with current plan of care       Recommendations   TBD as pt lives in [place] and family/pt wishes to be closer to home                Oral Care Recommendations: Oral care BID Follow up Recommendations: Other (comment) (TBD) SLP Visit Diagnosis: Aphasia (R47.01) Plan: Continue with current plan of care                      Elvina Dixon, M.S., CCC-SLP 08/25/2020,  12:10 PM

## 2020-08-25 NOTE — Progress Notes (Signed)
Physical Therapy Treatment Patient Details Name: Morgan Dixon MRN: [id number] DOB: 06/22/1950 Today's Date: 08/25/2020    History of Present Illness Pt is 70 yo female who presents to [facility] with AMS and dysphasia with CT showing L parietal hypodensity concerning for CVA vs mass as well as L renal mass. Pt also with abdominal wound and possible GI bleed. PMH: gastric bypass surgery, HTN, chronic back pain with spinal cord stimulator.    PT Comments    Patient increased ambulation distance this session. Patient continues to require minA at times for ambulation due to increasing gait speed with fatigue. Patient expressed frustration about word finding difficulties. Patient motivated about progress this session, however patient demos difficulty with activity pacing and awareness of fatigue. Continue to recommend SNF for ongoing Physical Therapy.       Follow Up Recommendations  SNF;Supervision for mobility/OOB     Equipment Recommendations  None recommended by PT    Recommendations for Other Services       Precautions / Restrictions Precautions Precautions: Fall Restrictions Weight Bearing Restrictions: No    Mobility  Bed Mobility Overal bed mobility: Needs Assistance Bed Mobility: Supine to Sit     Supine to sit: Supervision     General bed mobility comments: Supervision for safety    Transfers Overall transfer level: Needs assistance Equipment used: Rolling Ciana Simmon (2 wheeled) Transfers: Sit to/from Stand Sit to Stand: Min guard         General transfer comment: min guard for safety  Ambulation/Gait Ambulation/Gait assistance: Min assist Gait Distance (Feet): 200 Feet Assistive device: Rolling Ariadna Setter (2 wheeled) Gait Pattern/deviations: Decreased stride length;Step-through pattern;Trunk flexed;Wide base of support Gait velocity: decreased   General Gait Details: minA for balance with mobility as patient with increased speed at times when fatigued. Attempted  2' ambulation without AD but patient reaching for objects for support with minA for balance. 2/4 DOE on completion of Sports coach    Modified Rankin (Stroke Patients Only) Modified Rankin (Stroke Patients Only) Pre-Morbid Rankin Score: No symptoms Modified Rankin: Moderately severe disability     Balance Overall balance assessment: Needs assistance Sitting-balance support: No upper extremity supported;Feet supported Sitting balance-Leahy Scale: Fair     Standing balance support: During functional activity;Bilateral upper extremity supported Standing balance-Leahy Scale: Poor Standing balance comment: Reliant on UE support for balance.                            Cognition Arousal/Alertness: Awake/alert Behavior During Therapy: WFL for tasks assessed/performed Overall Cognitive Status: No family/caregiver present to determine baseline cognitive functioning                                 General Comments: word finding difficulty throughout. Decreased awareness of safety and deficits as patient requesting to ambulate without RW but relies on UE support with short mobility without AD      Exercises      General Comments        Pertinent Vitals/Pain Pain Assessment: No/denies pain    Home Living                      Prior Function            PT Goals (current goals can now be  found in the care plan section) Acute Rehab PT Goals Patient Stated Goal: to walk in the hallway PT Goal Formulation: With patient Time For Goal Achievement: 09/02/20 Potential to Achieve Goals: Good Progress towards PT goals: Progressing toward goals    Frequency    Min 3X/week      PT Plan Frequency needs to be updated    Co-evaluation              AM-PAC PT "6 Clicks" Mobility   Outcome Measure  Help needed turning from your back to your side while in a flat bed without using bedrails?:  A Little Help needed moving from lying on your back to sitting on the side of a flat bed without using bedrails?: A Little Help needed moving to and from a bed to a chair (including a wheelchair)?: A Little Help needed standing up from a chair using your arms (e.g., wheelchair or bedside chair)?: A Little Help needed to walk in hospital room?: A Little Help needed climbing 3-5 steps with a railing? : A Lot 6 Click Score: 17    End of Session Equipment Utilized During Treatment: Gait belt Activity Tolerance: Patient tolerated treatment well Patient left: in chair;with call bell/phone within reach (notified NT about chair alarm not functioning properly) Nurse Communication: Mobility status PT Visit Diagnosis: Unsteadiness on feet (R26.81);Other abnormalities of gait and mobility (R26.89);Muscle weakness (generalized) (M62.81)     Time: 1610-9604 PT Time Calculation (min) (ACUTE ONLY): 23 min  Charges:  $Gait Training: 23-37 mins                     Crystale Giannattasio A. Gilford Rile PT, DPT Acute Rehabilitation Services Pager [phone] Office [phone]    Linna Hoff 08/25/2020, 10:26 AM

## 2020-08-25 NOTE — Progress Notes (Signed)
Abd wound has made much progress and is nearly healed with only a very small are yet to to heal Would like for this to be completely healed prior to loop implant Discussed with the patient She has follow up in place with EP service  We will plan for Zio AT monitor at time of discharge. Please make EP service aware once SNF bed/insurance issues are resolved and discharge day is known. We can only order the monitor on the day of discharge.  Thank you Tommye Standard, PA-C

## 2020-08-25 NOTE — Progress Notes (Signed)
PROGRESS NOTE    Morgan Dixon   YQM:[id number]  DOB: July 22, 1950  DOA: 08/17/2020 PCP: Alfonse Flavors, MD   Brief Narrative:  Morgan Dixon is a 70 year old female who is status post gastric bypass (07/19/18) and has hypertension and chronic back pain who presented to the hospital after community health nurse noted her to be confused.  She was found to have a left parietal subacute infarct which is thought to be the cause for her confusion which has since resolved.   Subjective: No new complaints.     Assessment & Plan:   Principal Problem:   CVA (cerebral vascular accident)  -Initial CT of the head> Hypoattenuation left temporoparietal lobe has an appearance worrisome for neoplasm. - Unable to have an MRI due to spinal stimulator - - CT venogram > Enhancing leptomeningeal contrast over the area of abnormal density in the left parietal lobe, consistent with subacute infarct.  - 2 D ECHO> no thrombus -Neurology feels CVA may be embolic and recommends a loop recorder and aspirin and Plavix for 3 weeks followed by Plavix alone - LDL 88, goal less than 70- statin has been started - Plan is for her to transition to skilled nursing facility for rehab  - patient needs a Zio patch on day of discharge and EP will need to be called   Active Problems: Hypertension - Home blood pressure meds include  HCTZ and Catapres - Currently receiving amlodipine and lisinopril- BP low - d/c Lisinopril  Heme positive stool -History of Billroth II procedure - GI was consulted while the patient was at [facility] and preferred not to do a work-up in the setting of acute neurological issue - She is now on aspirin and Plavix which should not be discontinued -Hemoglobin has remained stable since admission - We will continue to check stool Hemoccults-I have ordered this  Left renal mass -CT abd/pelvis > Small left renal cortical mass, posteromedial midpole, which is larger than  prior exam. This could reflect a small renal neoplasm. - Recommend CT abdomen with and without contrast in 3 to 6 months to assess this-patient is unable to have an MRI  Anterior abdominal wall wound from prior PEG tube - she presented to the hospital with this - Continue wound care recommendations- healing well and nearly closed  Pain at PEG site - wound nearly healed but patient wants oxycodone for the pain- have d/c'd this in favor of Tylenol & extra strength Tylenol  Anxiety - Resume Zoloft  - Wellbutrin being resumed at 1/2 dose (was on 300 BID) to prevent withdrawal symptoms- as it can increase seizure risk, it should be stopped but will need to be weaned off  Acute metabolic encephalopathy - noted while at [facility] - has resolved - I am unsure of the etiology-   Obesity Body mass index is 35.51 kg/m. - s/p gastric bypass on 4/420  Time spent in minutes: 35 DVT prophylaxis: SCDs Start: 08/18/20 0050  Code Status: Full code Family Communication:  Level of Care: Level of care: Med-Surg Disposition Plan:  Status is: Inpatient  Remains inpatient appropriate because:Unsafe d/c plan   Dispo: The patient is from: Home              Anticipated d/c is to: SNF              Patient currently is medically stable to d/c.   Difficult to place patient Yes      Consultants:  neurology Procedures:   none Antimicrobials:  Anti-infectives (From admission, onward)   Start     Dose/Rate Route Frequency Ordered Stop   08/17/20 2130  cefTRIAXone (ROCEPHIN) 1 g in sodium chloride 0.9 % 100 mL IVPB  Status:  Discontinued        1 g 200 mL/hr over 30 Minutes Intravenous Every 24 hours 08/17/20 2115 08/19/20 0955       Objective: Vitals:   08/24/20 2348 08/25/20 0359 08/25/20 0818 08/25/20 1213  BP: 117/63 (!) 108/58 100/67 102/62  Pulse: (!) 50 (!) 47 (!) 55 63  Resp: 18 18 18 16   Temp: 98 F (36.7 C) (!) 97.5 F (36.4 C) 98.3 F (36.8 C) 99.1 F (37.3 C)   TempSrc: Oral Oral Oral Oral  SpO2: 97% 97% 98% 99%  Weight:      Height:        Intake/Output Summary (Last 24 hours) at 08/25/2020 1408 Last data filed at 08/25/2020 1300 Gross per 24 hour  Intake 720 ml  Output 775 ml  Net -55 ml   Filed Weights   08/17/20 1352 08/19/20 0328  Weight: 97.5 kg 99.8 kg    Examination: General exam: Appears comfortable  HEENT: PERRLA, oral mucosa moist, no sclera icterus or thrush Respiratory system: Clear to auscultation. Respiratory effort normal. Cardiovascular system: S1 & S2 heard, regular rate and rhythm Gastrointestinal system: Abdomen soft, non-tender, nondistended. Normal bowel sounds   Central nervous system: Alert and oriented. No focal neurological deficits. Extremities: No cyanosis, clubbing or edema Skin: small abdominal wound almost healed Psychiatry:  Mood & affect appropriate.      Data Reviewed: I have personally reviewed following labs and imaging studies  CBC: Recent Labs  Lab 08/20/20 0145 08/21/20 0238 08/22/20 0716 08/24/20 0307  WBC 3.5* 3.7* 3.2* 3.5*  HGB 10.4* 10.5* 10.5* 10.4*  HCT 31.4* 32.1* 32.0* 31.7*  MCV 88.5 88.9 89.6 88.8  PLT 137* 151 145* [id number]   Basic Metabolic Panel: Recent Labs  Lab 08/20/20 0145 08/21/20 0238 08/22/20 0716 08/24/20 0307  NA 137 137 139 137  K 3.6 3.8 3.6 3.4*  CL 109 105 107 105  CO2 24 25 26 24   GLUCOSE 97 98 99 116*  BUN <5* 5* 9 8  CREATININE 0.78 0.70 0.77 0.89  CALCIUM 8.4* 8.7* 8.9 8.8*   GFR: Estimated Creatinine Clearance: 71.1 mL/min (by C-G formula based on SCr of 0.89 mg/dL). Liver Function Tests: Recent Labs  Lab 08/20/20 0145 08/21/20 0238 08/22/20 0716 08/24/20 0307  AST 44* 32 23 19  ALT 23 20 18 15   ALKPHOS 48 48 47 51  BILITOT 1.4* 0.9 1.1 1.1  PROT 5.3* 5.6* 5.6* 5.6*  ALBUMIN 2.7* 2.7* 2.8* 2.8*   No results for input(s): LIPASE, AMYLASE in the last 168 hours. No results for input(s): AMMONIA in the last 168 hours. Coagulation  Profile: No results for input(s): INR, PROTIME in the last 168 hours. Cardiac Enzymes: No results for input(s): CKTOTAL, CKMB, CKMBINDEX, TROPONINI in the last 168 hours. BNP (last 3 results) No results for input(s): PROBNP in the last 8760 hours. HbA1C: No results for input(s): HGBA1C in the last 72 hours. CBG: Recent Labs  Lab 08/24/20 1211 08/24/20 1744 08/25/20 0048 08/25/20 0708 08/25/20 1210  GLUCAP 100* 97 157* 92 97   Lipid Profile: No results for input(s): CHOL, HDL, LDLCALC, TRIG, CHOLHDL, LDLDIRECT in the last 72 hours. Thyroid Function Tests: No results for input(s): TSH, T4TOTAL, FREET4, T3FREE, THYROIDAB in the  last 72 hours. Anemia Panel: No results for input(s): VITAMINB12, FOLATE, FERRITIN, TIBC, IRON, RETICCTPCT in the last 72 hours. Urine analysis:    Component Value Date/Time   COLORURINE YELLOW 08/17/2020 2100   APPEARANCEUR CLEAR 08/17/2020 2100   LABSPEC 1.042 (H) 08/17/2020 2100   PHURINE 6.0 08/17/2020 2100   GLUCOSEU NEGATIVE 08/17/2020 2100   HGBUR SMALL (A) 08/17/2020 2100   [place] NEGATIVE 08/17/2020 2100   [place] NEGATIVE 08/17/2020 2100   PROTEINUR NEGATIVE 08/17/2020 2100   NITRITE NEGATIVE 08/17/2020 2100   LEUKOCYTESUR NEGATIVE 08/17/2020 2100   Sepsis Labs: @LABRCNTIP (procalcitonin:4,lacticidven:4) ) Recent Results (from the past 240 hour(s))  Blood culture (routine x 2)     Status: None   Collection Time: 08/17/20  3:51 PM   Specimen: BLOOD RIGHT HAND  Result Value Ref Range Status   Specimen Description BLOOD RIGHT HAND  Final   Special Requests   Final    Blood Culture adequate volume BOTTLES DRAWN AEROBIC AND ANAEROBIC   Culture   Final    NO GROWTH 5 DAYS Performed at [facility], [address] [place], [place] [address]    Report Status 08/22/2020 FINAL  Final  Blood culture (routine x 2)     Status: None   Collection Time: 08/17/20  3:51 PM   Specimen: BLOOD  Result Value Ref Range Status   Specimen  Description BLOOD LEFT ANTECUBITAL  Final   Special Requests   Final    Blood Culture adequate volume BOTTLES DRAWN AEROBIC AND ANAEROBIC   Culture   Final    NO GROWTH 5 DAYS Performed at [facility], [address], [place], [place] [address]    Report Status 08/22/2020 FINAL  Final  Urine culture     Status: None   Collection Time: 08/17/20  9:00 PM   Specimen: Urine, Random  Result Value Ref Range Status   Specimen Description   Final    URINE, RANDOM Performed at [facility], [address], [place], [place] [address]    Special Requests   Final    NONE Performed at [facility], [address], [place], [place] [address]    Culture   Final    NO GROWTH Performed at [facility], [place] [address], [place],  [address]    Report Status 08/19/2020 FINAL  Final  SARS CORONAVIRUS 2 (TAT 6-24 HRS) Nasopharyngeal Nasopharyngeal Swab     Status: None   Collection Time: 08/17/20  9:39 PM   Specimen: Nasopharyngeal Swab  Result Value Ref Range Status   SARS Coronavirus 2 NEGATIVE NEGATIVE Final    Comment: (NOTE) SARS-CoV-2 target nucleic acids are NOT DETECTED.  The SARS-CoV-2 RNA is generally detectable in upper and lower respiratory specimens during the acute phase of infection. Negative results do not preclude SARS-CoV-2 infection, do not rule out co-infections with other pathogens, and should not be used as the sole basis for treatment or other patient management decisions. Negative results must be combined with clinical observations, patient history, and epidemiological information. The expected result is Negative.  Fact Sheet for Patients: SugarRoll.be  Fact Sheet for Healthcare Providers: https://www.woods-mathews.com/  This test is not yet approved or cleared by the [place] FDA and  has been authorized for detection and/or diagnosis of SARS-CoV-2 by FDA under an Emergency Use Authorization (EUA). This EUA  will remain  in effect (meaning this test can be used) for the duration of the COVID-19 declaration under Se ction 564(b)(1) of the Act, 21  U.S.C. section 360bbb-3(b)(1), unless the authorization is terminated or revoked sooner.  Performed at [facility], [place] [address] [place], [place] [address]   MRSA PCR Screening     Status: None   Collection Time: 08/19/20  9:30 AM   Specimen: Nasopharyngeal  Result Value Ref Range Status   MRSA by PCR NEGATIVE NEGATIVE Final    Comment:        The GeneXpert MRSA Assay (FDA approved for NASAL specimens only), is one component of a comprehensive MRSA colonization surveillance program. It is not intended to diagnose MRSA infection nor to guide or monitor treatment for MRSA infections. Performed at [facility], [address], [place], [place] [address]          Radiology Studies: No results found.    Scheduled Meds: . amLODipine  5 mg Oral Daily  . aspirin  300 mg Rectal Daily   Or  . aspirin  325 mg Oral Daily  . atorvastatin  40 mg Oral Daily  . buPROPion  300 mg Oral Daily  . carbamide peroxide  5 drop Both EARS BID  . clopidogrel  75 mg Oral Q breakfast  . methocarbamol  500 mg Oral TID  . neomycin-polymyxin-hydrocortisone  3 drop Left EAR Q6H  . pantoprazole  40 mg Oral QHS  . polyethylene glycol  17 g Oral Daily  . pregabalin  200 mg Oral BID  . sertraline  25 mg Oral Daily   Continuous Infusions:   LOS: 8 days      Debbe Odea, MD [facility] Pager: [email or web address] 08/25/2020, 2:08 PM

## 2020-08-25 NOTE — TOC Progression Note (Signed)
Transition of Care [facility]) - Progression Note    Patient Details  Name: Morgan Dixon MRN: [id number] Date of Birth: 25-Mar-1951  Transition of Care [facility]) CM/SW [place], [place] Phone Number: 08/25/2020, 4:23 PM  Clinical Narrative:   CSW following for SNF placement. CSW obtained information from financial counseling that the information on the Medicare screen is from a worker's comp incident from 2001, that is no longer open and patient is now retired. CSW reached back out to [facility] on if they could accept the patient with that information, and they are checking on available beds and will get back to CSW. CSW faxed patient out in [place], and she has bed offers locally. CSW spoke with patient on the phone to update, and she doesn't want to be so far away from home. Patient said she wanted to speak to her daughter and then get back with CSW afterwards. CSW attempted to call patient back later and phone was busy. CSW received a call from patient's daughter, Morgan Dixon, to verify information that was told to the patient. Morgan Dixon would like CSW to check [place], as that would be closer for them to get to instead of [place]. CSW to contact SNFs to see about bed availability, as well as wait to hear back from [facility] on any bed availability. CSW to follow.    Expected Discharge Plan: [place] Barriers to Discharge: Continued Medical Work up,Insurance Authorization  Expected Discharge Plan and Services Expected Discharge Plan: [place] In-house Referral: Clinical Social Work   Post Acute Care Choice: [place] Living arrangements for the past 2 months: Single Family Home                                       Social Determinants of Health (SDOH) Interventions    Readmission Risk Interventions Readmission Risk Prevention Plan 08/19/2020  Medication Screening Complete  Transportation Screening  Complete  Some recent data might be hidden

## 2020-08-26 DIAGNOSIS — N2889 Other specified disorders of kidney and ureter: Secondary | ICD-10-CM | POA: Diagnosis not present

## 2020-08-26 LAB — GLUCOSE, CAPILLARY
Glucose-Capillary: 115 mg/dL — ABNORMAL HIGH (ref 70–99)
Glucose-Capillary: 138 mg/dL — ABNORMAL HIGH (ref 70–99)
Glucose-Capillary: 94 mg/dL (ref 70–99)

## 2020-08-26 LAB — OCCULT BLOOD X 1 CARD TO LAB, STOOL: Fecal Occult Bld: NEGATIVE

## 2020-08-26 MED ORDER — ASPIRIN 81 MG PO CHEW
81.0000 mg | CHEWABLE_TABLET | Freq: Every day | ORAL | Status: DC
  Administered 2020-08-27 – 2020-09-05 (×10): 81 mg via ORAL
  Filled 2020-08-26 (×10): qty 1

## 2020-08-26 MED ORDER — MORPHINE SULFATE (PF) 2 MG/ML IV SOLN
2.0000 mg | Freq: Once | INTRAVENOUS | Status: AC
  Administered 2020-08-26: 2 mg via INTRAVENOUS
  Filled 2020-08-26: qty 1

## 2020-08-26 MED ORDER — BISACODYL 10 MG RE SUPP
10.0000 mg | Freq: Once | RECTAL | Status: DC
  Filled 2020-08-26 (×2): qty 1

## 2020-08-26 NOTE — Progress Notes (Signed)
Patient refused daily laxatives/stool softeners this morning. Nurse attempted to administer ducolax suppository per MD in order to obtain hemoccult sample. Patient vehemently refused. Patient educated om importance of regular bowel movements and reminded that she has not had one in several days. Patient continues to refuse and states "I know my body, and I just know that if I give it the time it needs I'll have one late this afternoon or early tomorrow." MD notified of patient's refusal.

## 2020-08-26 NOTE — Progress Notes (Signed)
Physical Therapy Treatment Patient Details Name: Morgan Dixon MRN: [id number] DOB: 01-25-1951 Today's Date: 08/26/2020    History of Present Illness Pt is 70 yo female who presents to [facility] with AMS and dysphasia with CT showing L parietal hypodensity concerning for CVA vs mass as well as L renal mass. Pt also with abdominal wound and possible GI bleed. PMH: gastric bypass surgery, HTN, chronic back pain with spinal cord stimulator.    PT Comments    Patient progressing towards physical therapy goals. Patient requires minA for dynamic standing balance with no UE support as patient had LOB x 2. Patient ambulated 200' with RW and min guard, noted difficulty activity pacing. Patient continues to be frustrated by word finding difficulty. Continue to recommend SNF for ongoing Physical Therapy.       Follow Up Recommendations  SNF;Supervision for mobility/OOB     Equipment Recommendations  None recommended by PT    Recommendations for Other Services       Precautions / Restrictions Precautions Precautions: Fall Restrictions Weight Bearing Restrictions: No    Mobility  Bed Mobility Overal bed mobility: Needs Assistance Bed Mobility: Supine to Sit     Supine to sit: Supervision     General bed mobility comments: Supervision for safety    Transfers Overall transfer level: Needs assistance Equipment used: Rolling Berniece Abid (2 wheeled) Transfers: Sit to/from Stand Sit to Stand: Min guard         General transfer comment: min guard for safety  Ambulation/Gait Ambulation/Gait assistance: Min guard Gait Distance (Feet): 200 Feet Assistive device: Rolling Arend Bahl (2 wheeled) Gait Pattern/deviations: Decreased stride length;Step-through pattern;Trunk flexed;Wide base of support Gait velocity: decreased   General Gait Details: min guard for safety. Patient with difficulty activity pacing.   Stairs             Wheelchair Mobility    Modified Rankin (Stroke  Patients Only) Modified Rankin (Stroke Patients Only) Pre-Morbid Rankin Score: No symptoms Modified Rankin: Moderately severe disability     Balance Overall balance assessment: Needs assistance Sitting-balance support: No upper extremity supported;Feet supported Sitting balance-Leahy Scale: Fair     Standing balance support: No upper extremity supported;During functional activity Standing balance-Leahy Scale: Poor Standing balance comment: while pulling up underwear in bathroom, patient with LOB x 2 with minA to recover. Patient requires UE support for balance                            Cognition Arousal/Alertness: Awake/alert Behavior During Therapy: WFL for tasks assessed/performed Overall Cognitive Status: No family/caregiver present to determine baseline cognitive functioning                                        Exercises      General Comments        Pertinent Vitals/Pain Pain Assessment: No/denies pain    Home Living                      Prior Function            PT Goals (current goals can now be found in the care plan section) Acute Rehab PT Goals Patient Stated Goal: to get better PT Goal Formulation: With patient Time For Goal Achievement: 09/02/20 Potential to Achieve Goals: Good Progress towards PT goals: Progressing toward goals    Frequency  Min 3X/week      PT Plan Current plan remains appropriate    Co-evaluation              AM-PAC PT "6 Clicks" Mobility   Outcome Measure  Help needed turning from your back to your side while in a flat bed without using bedrails?: A Little Help needed moving from lying on your back to sitting on the side of a flat bed without using bedrails?: A Little Help needed moving to and from a bed to a chair (including a wheelchair)?: A Little Help needed standing up from a chair using your arms (e.g., wheelchair or bedside chair)?: A Little Help needed to walk in  hospital room?: A Little Help needed climbing 3-5 steps with a railing? : A Lot 6 Click Score: 17    End of Session Equipment Utilized During Treatment: Gait belt Activity Tolerance: Patient tolerated treatment well Patient left: in chair;with call bell/phone within reach;with chair alarm set Nurse Communication: Mobility status PT Visit Diagnosis: Unsteadiness on feet (R26.81);Other abnormalities of gait and mobility (R26.89);Muscle weakness (generalized) (M62.81)     Time: 5427-0623 PT Time Calculation (min) (ACUTE ONLY): 35 min  Charges:  $Therapeutic Activity: 23-37 mins                     Jakyiah Briones A. Gilford Rile PT, DPT Acute Rehabilitation Services Pager [phone] Office [phone]    Linna Hoff 08/26/2020, 5:04 PM

## 2020-08-26 NOTE — TOC Progression Note (Signed)
Transition of Care [facility]) - Progression Note    Patient Details  Name: Morgan Dixon MRN: [id number] Date of Birth: [birth year]  Transition of Care [facility]) CM/SW [place] Phone Number: 08/26/2020, 1:48 PM  Clinical Narrative:   CSW following for discharge plan. CSW has reached out to the following facilities for placement: -[facility], asked to review again and awaiting response -[facility], no bed available through next week -[facility], left a Advertising account executive for [place] in Admissions -[facility], no beds available through next week -[facility], left voicemail for [facility], left a Advertising account executive for [facility], left a voicemail for [facility], spoke with [facility] and they are in network and have beds available. CSW sent referral to them, they will review and call back    Expected Discharge Plan: [place] Barriers to Discharge: Continued Medical Work up,Insurance Authorization  Expected Discharge Plan and Services Expected Discharge Plan: [place] In-house Referral: Clinical Social Work   Post Acute Care Choice: [place] Living arrangements for the past 2 months: Single Family Home                                       Social Determinants of Health (SDOH) Interventions    Readmission Risk Interventions Readmission Risk Prevention Plan 08/19/2020  Medication Screening Complete  Transportation Screening Complete  Some recent data might be hidden

## 2020-08-26 NOTE — Progress Notes (Addendum)
PROGRESS NOTE    ACIRE TANG   UYQ:[id number]  DOB: Nov 27, 1950  DOA: 08/17/2020 PCP: Alfonse Flavors, MD   Brief Narrative:  Morgan Dixon is a 70 year old female who is status post gastric bypass (07/19/18) and has hypertension and chronic back pain who presented to the hospital after community health nurse noted her to be confused.  She was found to have a left parietal subacute infarct.  Subjective: No complaints today. No BM since Sunday or Monday. Declines laxatives.   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 - Unable to have an MRI due to spinal stimulator - - CT venogram > Enhancing leptomeningeal contrast over the area of abnormal density in the left parietal lobe, consistent with subacute infarct.  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 -s/p 2021 venting gastrostomy tube (now removed) - status post laparoscopic distal gastrectomy 3/29 status post incisional hernia repair on 4/4 - GI was consulted while the patient was at [facility] and preferred not to do a work-up in the setting of acute neurological issue - She is now on aspirin and Plavix which should not be discontinued -Hemoglobin has remained stable  since admission -   Hemoccult ordered but no BMs in many days- declines to take laxatives  Anterior abdominal wall wound from prior PEG tube - she presented to the hospital with this - Continue wound care recommendations- healing well and nearly closed - wound nearly healed but patient wants oxycodone for the pain- have d/c'd this in favor of Tylenol which is working well for her  Anxiety- husband passed away in 05/05/20 - Resume Zoloft  - Wellbutrin being resumed at 1/2 dose (was on 300 BID) to prevent withdrawal symptoms- as it can increase seizure risk, it should be stopped but will need to be weaned off  Acute metabolic encephalopathy- ? dementia - noted while at [facility] - has resolved - I am unsure of the etiology- - per daughter, the patient has had memory issues which started in 2012 and sometimes asks the same questions twice- no formal neurological eval  - I have not noted any memory deficits - I find her to be a good historian  Obesity Body mass index is 35.51 kg/m. - s/p gastric bypass     Left renal mass -CT abd/pelvis > Small left renal cortical mass, posteromedial midpole, which is larger than prior exam. This could reflect a small renal neoplasm. - Recommend CT abdomen with and without contrast in 3 to 6 months to assess  -patient is unable to have an MRI due to spinal stimulator  H/o DVT with PE  Time spent in minutes: 35- > 50 % of time taken in coordination of care and  discussion with daughter today DVT prophylaxis: SCDs Start: 08/18/20 0050  Code Status: Full code Family Communication: daughter Sheralyn Boatman Level of Care: Level of care: Med-Surg Disposition Plan:  Status is: Inpatient  Remains inpatient appropriate because:Unsafe d/c plan   Dispo: The patient is from: Home              Anticipated d/c is to: SNF              Patient currently is medically stable to d/c.   Difficult to place patient Yes      Consultants:    neurology Procedures:   none Antimicrobials:  Anti-infectives (From admission, onward)   Start     Dose/Rate Route Frequency Ordered Stop   08/17/20 2130  cefTRIAXone (ROCEPHIN) 1 g in sodium chloride 0.9 % 100 mL IVPB  Status:  Discontinued        1 g 200 mL/hr over 30 Minutes Intravenous Every 24 hours 08/17/20 2115 08/19/20 0955       Objective: Vitals:   08/25/20 2346 08/26/20 0341 08/26/20 0837 08/26/20 1237  BP: 127/60 114/86 (!) 148/74 135/76  Pulse: (!) 51 (!) 51 63 70  Resp: 18 17 18 18   Temp: 97.6 F (36.4 C) (!) 97.5 F (36.4 C) 98.4 F (36.9 C) 98.4 F (36.9 C)  TempSrc: Oral Oral Oral Oral  SpO2: 97% 98% 99% 98%  Weight:      Height:        Intake/Output Summary (Last 24 hours) at 08/26/2020 1326 Last data filed at 08/26/2020 1045 Gross per 24 hour  Intake 240 ml  Output 1900 ml  Net -1660 ml   Filed Weights   08/17/20 1352 08/19/20 0328  Weight: 97.5 kg 99.8 kg    Examination: General exam: Appears comfortable  HEENT: PERRLA, oral mucosa moist, no sclera icterus or thrush Respiratory system: Clear to auscultation. Respiratory effort normal. Cardiovascular system: S1 & S2 heard, regular rate and rhythm Gastrointestinal system: Abdomen soft, non-tender, nondistended. Normal bowel sounds   Central nervous system: Alert and oriented. Expressive aphasia- No focal neurological deficits. Extremities: No cyanosis, clubbing or edema Skin: No rashes or ulcers Psychiatry:  Mood & affect appropriate.     Data Reviewed: I have personally reviewed following labs and imaging studies  CBC: Recent Labs  Lab 08/20/20 0145 08/21/20 0238 08/22/20 0716 08/24/20 0307  WBC 3.5* 3.7* 3.2* 3.5*  HGB 10.4* 10.5* 10.5* 10.4*  HCT 31.4* 32.1* 32.0* 31.7*  MCV 88.5 88.9 89.6 88.8  PLT 137* 151 145* 756   Basic Metabolic Panel: Recent Labs  Lab 08/20/20 0145 08/21/20 0238 08/22/20 0716 08/24/20 0307  NA 137 137 139 137  K 3.6 3.8 3.6 3.4*  CL 109 105  107 105  CO2 24 25 26 24   GLUCOSE 97 98 99 116*  BUN <5* 5* 9 8  CREATININE 0.78 0.70 0.77 0.89  CALCIUM 8.4* 8.7* 8.9 8.8*   GFR: Estimated Creatinine Clearance: 71.1 mL/min (by C-G formula based on SCr of 0.89 mg/dL). Liver Function Tests: Recent Labs  Lab 08/20/20 0145 08/21/20 0238 08/22/20 0716 08/24/20 0307  AST 44* 32 23 19  ALT 23 20 18 15   ALKPHOS 48 48 47 51  BILITOT 1.4* 0.9 1.1 1.1  PROT 5.3* 5.6* 5.6* 5.6*  ALBUMIN 2.7* 2.7* 2.8* 2.8*   No results for input(s): LIPASE, AMYLASE in the last 168 hours. No results for input(s): AMMONIA in the last 168 hours. Coagulation Profile: No results for input(s): INR, PROTIME  in the last 168 hours. Cardiac Enzymes: No results for input(s): CKTOTAL, CKMB, CKMBINDEX, TROPONINI in the last 168 hours. BNP (last 3 results) No results for input(s): PROBNP in the last 8760 hours. HbA1C: No results for input(s): HGBA1C in the last 72 hours. CBG: Recent Labs  Lab 08/25/20 0048 08/25/20 0708 08/25/20 1210 08/26/20 0023 08/26/20 1239  GLUCAP 157* 92 97 94 138*   Lipid Profile: No results for input(s): CHOL, HDL, LDLCALC, TRIG, CHOLHDL, LDLDIRECT in the last 72 hours. Thyroid Function Tests: No results for input(s): TSH, T4TOTAL, FREET4, T3FREE, THYROIDAB in the last 72 hours. Anemia Panel: No results for input(s): VITAMINB12, FOLATE, FERRITIN, TIBC, IRON, RETICCTPCT in the last 72 hours. Urine analysis:    Component Value Date/Time   COLORURINE YELLOW 08/17/2020 2100   APPEARANCEUR CLEAR 08/17/2020 2100   LABSPEC 1.042 (H) 08/17/2020 2100   PHURINE 6.0 08/17/2020 2100   GLUCOSEU NEGATIVE 08/17/2020 2100   HGBUR SMALL (A) 08/17/2020 2100   [place] NEGATIVE 08/17/2020 2100   [place] NEGATIVE 08/17/2020 2100   PROTEINUR NEGATIVE 08/17/2020 2100   NITRITE NEGATIVE 08/17/2020 2100   LEUKOCYTESUR NEGATIVE 08/17/2020 2100   Sepsis Labs: @LABRCNTIP (procalcitonin:4,lacticidven:4) ) Recent Results (from the past 240  hour(s))  Blood culture (routine x 2)     Status: None   Collection Time: 08/17/20  3:51 PM   Specimen: BLOOD RIGHT HAND  Result Value Ref Range Status   Specimen Description BLOOD RIGHT HAND  Final   Special Requests   Final    Blood Culture adequate volume BOTTLES DRAWN AEROBIC AND ANAEROBIC   Culture   Final    NO GROWTH 5 DAYS Performed at [facility], [address] [place], [place] [address]    Report Status 08/22/2020 FINAL  Final  Blood culture (routine x 2)     Status: None   Collection Time: 08/17/20  3:51 PM   Specimen: BLOOD  Result Value Ref Range Status   Specimen Description BLOOD LEFT ANTECUBITAL  Final   Special Requests   Final    Blood Culture adequate volume BOTTLES DRAWN AEROBIC AND ANAEROBIC   Culture   Final    NO GROWTH 5 DAYS Performed at [facility], [address], [place], [place] [address]    Report Status 08/22/2020 FINAL  Final  Urine culture     Status: None   Collection Time: 08/17/20  9:00 PM   Specimen: Urine, Random  Result Value Ref Range Status   Specimen Description   Final    URINE, RANDOM Performed at [facility], [address], [place], [place] [address]    Special Requests   Final    NONE Performed at [facility], [address], [place], [place] [address]    Culture   Final    NO GROWTH Performed at [facility], [place] [address] [place], [place] [address]    Report Status 08/19/2020 FINAL  Final  SARS CORONAVIRUS 2 (TAT 6-24 HRS) Nasopharyngeal Nasopharyngeal Swab     Status: None   Collection Time: 08/17/20  9:39 PM   Specimen: Nasopharyngeal Swab  Result Value Ref Range Status   SARS Coronavirus 2 NEGATIVE NEGATIVE Final    Comment: (NOTE) SARS-CoV-2 target nucleic acids are NOT DETECTED.  The SARS-CoV-2 RNA is generally detectable in upper and lower respiratory specimens during the acute phase of infection. Negative results do not preclude SARS-CoV-2 infection, do not rule out co-infections with other  pathogens, and should not be used as the sole  basis for treatment or other patient management decisions. Negative results must be combined with clinical observations, patient history, and epidemiological information. The expected result is Negative.  Fact Sheet for Patients: SugarRoll.be  Fact Sheet for Healthcare Providers: https://www.woods-mathews.com/  This test is not yet approved or cleared by the [place] FDA and  has been authorized for detection and/or diagnosis of SARS-CoV-2 by FDA under an Emergency Use Authorization (EUA). This EUA will remain  in effect (meaning this test can be used) for the duration of the COVID-19 declaration under Se ction 564(b)(1) of the Act, 21 U.S.C. section 360bbb-3(b)(1), unless the authorization is terminated or revoked sooner.  Performed at [facility], [place] [address] [place], [place] [address]   MRSA PCR Screening     Status: None   Collection Time: 08/19/20  9:30 AM   Specimen: Nasopharyngeal  Result Value Ref Range Status   MRSA by PCR NEGATIVE NEGATIVE Final    Comment:        The GeneXpert MRSA Assay (FDA approved for NASAL specimens only), is one component of a comprehensive MRSA colonization surveillance program. It is not intended to diagnose MRSA infection nor to guide or monitor treatment for MRSA infections. Performed at [facility], [address] [place], [place] [address]          Radiology Studies: No results found.    Scheduled Meds: . amLODipine  5 mg Oral Daily  . aspirin  300 mg Rectal Daily   Or  . aspirin  325 mg Oral Daily  . atorvastatin  40 mg Oral Daily  . bisacodyl  10 mg Rectal Once  . buPROPion  300 mg Oral Daily  . carbamide peroxide  5 drop Both EARS BID  . clopidogrel  75 mg Oral Q breakfast  . methocarbamol  500 mg Oral TID  . neomycin-polymyxin-hydrocortisone  3 drop Left EAR Q6H  . pantoprazole  40 mg Oral QHS  . polyethylene  glycol  17 g Oral Daily  . pregabalin  200 mg Oral BID  . sertraline  25 mg Oral Daily   Continuous Infusions:   LOS: 9 days      Debbe Odea, MD [facility] Pager: [email or web address] 08/26/2020, 1:26 PM

## 2020-08-26 NOTE — Progress Notes (Signed)
NT informed RN that patient refused 6PM CBG check, patient stated she would "talk to the doctor about it in the morning." Patient expresses that seh wants tio be taken off CBG checks because she has never had issues with her blood sugar and her levels have all been normal. Day RN informed night RN.

## 2020-08-27 DIAGNOSIS — N2889 Other specified disorders of kidney and ureter: Secondary | ICD-10-CM | POA: Diagnosis not present

## 2020-08-27 DIAGNOSIS — I10 Essential (primary) hypertension: Secondary | ICD-10-CM

## 2020-08-27 DIAGNOSIS — E669 Obesity, unspecified: Secondary | ICD-10-CM | POA: Diagnosis present

## 2020-08-27 LAB — GLUCOSE, CAPILLARY: Glucose-Capillary: 89 mg/dL (ref 70–99)

## 2020-08-27 MED ORDER — AMLODIPINE BESYLATE 5 MG PO TABS: 5.0000 mg | ORAL_TABLET | Freq: Every day | ORAL | Status: DC

## 2020-08-27 MED ORDER — CLOPIDOGREL BISULFATE 75 MG PO TABS: 75.0000 mg | ORAL_TABLET | Freq: Every day | ORAL | Status: DC

## 2020-08-27 MED ORDER — ASPIRIN 81 MG PO CHEW: 81.0000 mg | CHEWABLE_TABLET | Freq: Every day | ORAL | Status: DC

## 2020-08-27 MED ORDER — ACETAMINOPHEN 500 MG PO TABS: 1000.0000 mg | ORAL_TABLET | Freq: Four times a day (QID) | ORAL | 0 refills | Status: DC | PRN

## 2020-08-27 MED ORDER — ATORVASTATIN CALCIUM 40 MG PO TABS
40.0000 mg | ORAL_TABLET | Freq: Every day | ORAL | Status: DC
Start: 2020-08-28 — End: 2020-09-05

## 2020-08-27 MED ORDER — BUPROPION HCL ER (SR) 150 MG PO TB12: 300.0000 mg | ORAL_TABLET | Freq: Every day | ORAL | Status: DC

## 2020-08-27 NOTE — TOC Progression Note (Signed)
Transition of Care [facility]) - Progression Note    Patient Details  Name: Morgan Dixon MRN: [id number] Date of Birth: 03-12-1951  Transition of [facility]) CM/SW [place], [place] Phone Number: 08/27/2020, 1:17 PM  Clinical Narrative:     CSW noted that pt and family would like to be in [place]. Referral's were sent yesterday, no return calls have been received. Pt would not like [facility], as it is far from home. Insurance needs to be started by facility. SW will follow for facility choice and insurance authorization.  Expected Discharge Plan: [place] Barriers to Discharge: Continued Medical Work up,Insurance Authorization  Expected Discharge Plan and Services Expected Discharge Plan: [place] In-house Referral: Clinical Social Work   Post Acute Care Choice: [place] Living arrangements for the past 2 months: Single Family Home                                       Social Determinants of Health (SDOH) Interventions    Readmission Risk Interventions Readmission Risk Prevention Plan 08/19/2020  Medication Screening Complete  Transportation Screening Complete  Some recent data might be hidden

## 2020-08-27 NOTE — Discharge Summary (Signed)
Physician Discharge Summary  Morgan Dixon [id number] DOB: [birth year] DOA: 08/17/2020  PCP: Alfonse Flavors, MD  Admit date: 08/17/2020 Discharge date: 08/27/2020  Admitted From: home  Disposition:  SNF   THIS IS A [facility] WAIT ON SNF AND WILL BE UPDATED ON DC  Recommendations for Outpatient Follow-up:  1. SNF   Home Health:    Discharge Condition:  stable   CODE STATUS:  Full code   Diet recommendation:  Heart heathy Consultations:  neurology  Procedures/Studies: . 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  She was found to have a left parietal subacute infarct.  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 - Unable to have an MRI due to spinal stimulator - - CT venogram > Enhancing leptomeningeal contrast over the area of abnormal density in the left parietal lobe, consistent with subacute infarct.  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 -s/p 2021 venting gastrostomy tube (now removed)  - status post laparoscopic distal gastrectomy 07/12/20 - status post incisional hernia repair on 07/18/20 - GI was consulted while the patient was at [facility] and preferred not to do a work-up in the setting of acute neurological issue - She is now on aspirin and Plavix which should not be discontinued - Hemoglobin has remained stable since admission -   repeat Hemoccult negative on 5/13  Anterior abdominal wall wound from prior PEG tube - she presented to the hospital with this - wound nearly healed  - Have been using PRN Tylenol for pain.   Anxiety- husband passed away in 05-22-20 - Resume Zoloft  - Wellbutrin being resumed at 1/2 dose (was on 300 BID) to prevent withdrawal symptoms- as it can increase seizure risk, it should be stopped but will need to be weaned off  Acute metabolic encephalopathy- ? dementia - noted while at [facility] - has resolved - I am unsure of the etiology- - per daughter, the patient has had memory issues which started in 2012 and sometimes asks the same questions twice- no formal neurological eval  - I have not noted any memory deficits - I find her to be a good historian  Obesity Body mass index is 35.51 kg/m. - s/p gastric bypass    Left  renal mass -CT abd/pelvis > Small left renal cortical mass, posteromedial midpole, which is larger than prior exam. This could reflect a small renal neoplasm. - Recommend CT abdomen with and without contrast in 3 to 6 months to assess  -patient is unable to have an MRI due to spinal stimulator- dw patient and daughter  H/o DVT with PE    Discharge Exam: Vitals:   08/27/20 0740 08/27/20 0944  BP: (!) 148/70 138/66  Pulse: (!) 53 61  Resp: 18 18  Temp: 98.5 F (36.9  C)   SpO2: 98%    Vitals:   08/26/20 2349 08/27/20 0400 08/27/20 0740 08/27/20 0944  BP: 131/62 135/70 (!) 148/70 138/66  Pulse: 63 (!) 54 (!) 53 61  Resp: 18 16 18 18   Temp: 97.7 F (36.5 C) 98.1 F (36.7 C) 98.5 F (36.9 C)   TempSrc: Axillary Axillary Oral   SpO2: 100% 97% 98%   Weight:      Height:        General: Pt is alert, awake, not in acute distress Cardiovascular: RRR, S1/S2 +, no rubs, no gallops Respiratory: CTA bilaterally, no wheezing, no rhonchi Abdominal: Soft, NT, ND, bowel sounds + Extremities: no edema, no cyanosis   Discharge Instructions  Discharge Instructions    Diet - low sodium heart healthy   Complete by: As directed    Increase activity slowly   Complete by: As directed    No wound care   Complete by: As directed      Allergies as of 08/27/2020      Reactions   Bupropion Nausea And Vomiting   Diazepam Rash   Penicillins Itching   Rofecoxib Swelling   Latex Itching   When used gloves in the past   Prozac [fluoxetine Hcl] Itching, Rash   Zinc Oxide [mexsana] Rash      Medication List    STOP taking these medications   cloNIDine 0.1 MG tablet Commonly known as: CATAPRES   hydrochlorothiazide 25 MG tablet Commonly known as: HYDRODIURIL   ondansetron 4 MG tablet Commonly known as: ZOFRAN   potassium chloride 10 MEQ tablet Commonly known as: KLOR-CON     TAKE these medications   acetaminophen 500 MG tablet Commonly known as: TYLENOL Take 2 tablets (1,000 mg total) by mouth every 6 (six) hours as needed for moderate pain. What changed:   how much to take  reasons to take this   amLODipine 5 MG tablet Commonly known as: NORVASC Take 1 tablet (5 mg total) by mouth daily. Start taking on: Aug 28, 2020   aspirin 81 MG chewable tablet Chew 1 tablet (81 mg total) by mouth daily. Start taking on: Aug 28, 2020   atorvastatin 40 MG tablet Commonly known as: LIPITOR Take 1 tablet (40 mg total) by mouth daily. Start  taking on: Aug 28, 2020   buPROPion 150 MG 12 hr tablet Commonly known as: WELLBUTRIN SR Take 2 tablets (300 mg total) by mouth daily. Start taking on: Aug 28, 2020 What changed: when to take this   clopidogrel 75 MG tablet Commonly known as: PLAVIX Take 1 tablet (75 mg total) by mouth daily with breakfast. Start taking on: Aug 28, 2020   enoxaparin 40 MG/0.4ML injection Commonly known as: LOVENOX Inject 40 mg into the skin daily.   multivitamin tablet Take 1 tablet by mouth daily. Bariatric vitamin   ondansetron 4 MG disintegrating tablet Commonly known as: ZOFRAN-ODT Take 4 mg by mouth every 8 (eight) hours  as needed for nausea or vomiting.   pantoprazole 40 MG tablet Commonly known as: PROTONIX Take 40 mg by mouth daily.   pregabalin 100 MG capsule Commonly known as: LYRICA Take 200 mg by mouth 2 (two) times daily.   sertraline 25 MG tablet Commonly known as: ZOLOFT Take 25 mg by mouth at bedtime.   tiZANidine 4 MG tablet Commonly known as: ZANAFLEX Take 4 mg by mouth 3 (three) times daily as needed for muscle spasms.       Follow-up Information    Lanier PrudeLambert, Cameron T, MD Follow up.   Specialties: Cardiology, Radiology Why: 10/07/2020 @ 9:00AM, follow up on heart monitor (loop) implant timing/decision Contact information: [address] [place] [address] [phone]              Allergies  Allergen Reactions  . Bupropion Nausea And Vomiting  . Diazepam Rash  . Penicillins Itching  . Rofecoxib Swelling  . Latex Itching    When used gloves in the past  . Prozac [Fluoxetine Hcl] Itching and Rash  . Zinc Oxide [Mexsana] Rash      CT ANGIO HEAD W OR WO CONTRAST  Result Date: 08/20/2020 CLINICAL DATA:  Ataxia EXAM: CT ANGIOGRAPHY HEAD AND NECK TECHNIQUE: Multidetector CT imaging of the head and neck was performed using the standard protocol during bolus administration of intravenous contrast. Multiplanar CT image reconstructions and  MIPs were obtained to evaluate the vascular anatomy. Carotid stenosis measurements (when applicable) are obtained utilizing NASCET criteria, using the distal internal carotid diameter as the denominator. CONTRAST:  75mL OMNIPAQUE IOHEXOL 350 MG/ML SOLN COMPARISON:  None. FINDINGS: CTA NECK FINDINGS SKELETON: There is no bony spinal canal stenosis. No lytic or blastic lesion. OTHER NECK: Normal pharynx, larynx and major salivary glands. No cervical lymphadenopathy. Unremarkable thyroid gland. UPPER CHEST: No pneumothorax or pleural effusion. No nodules or masses. AORTIC ARCH: There is no calcific atherosclerosis of the aortic arch. There is no aneurysm, dissection or hemodynamically significant stenosis of the visualized portion of the aorta. Conventional 3 vessel aortic branching pattern. The visualized proximal subclavian arteries are widely patent. RIGHT CAROTID SYSTEM: Normal without aneurysm, dissection or stenosis. LEFT CAROTID SYSTEM: Normal without aneurysm, dissection or stenosis. VERTEBRAL ARTERIES: Left dominant configuration. Both origins are clearly patent. There is no dissection, occlusion or flow-limiting stenosis to the skull base (V1-V3 segments). CTA HEAD FINDINGS POSTERIOR CIRCULATION: --Vertebral arteries: Normal V4 segments. --Inferior cerebellar arteries: Normal. --Basilar artery: Normal. --Superior cerebellar arteries: Normal. --Posterior cerebral arteries (PCA): Normal. ANTERIOR CIRCULATION: --Intracranial internal carotid arteries: Normal. --Anterior cerebral arteries (ACA): Normal. Both A1 segments are present. Patent anterior communicating artery (a-comm). --Middle cerebral arteries (MCA): Normal. ANATOMIC VARIANTS: None Review of the MIP images confirms the above findings. IMPRESSION: Normal CTA of the head and neck. Electronically Signed   By: Deatra RobinsonKevin  Herman M.D.   On: 08/20/2020 22:31   CT Head Wo Contrast  Result Date: 08/17/2020 CLINICAL DATA:  Altered mental status. EXAM: CT HEAD  WITHOUT CONTRAST TECHNIQUE: Contiguous axial images were obtained from the base of the skull through the vertex without intravenous contrast. COMPARISON:  None. FINDINGS: Brain: Hypoattenuation in the posterior left temporal and parietal lobes is mainly in the white matter with overlying cortex largely preserved worrisome for tumor. No hemorrhage, mass, midline shift or abnormal extra-axial fluid collection is identified. No hydrocephalus or pneumocephalus. Vascular: No hyperdense vessel or unexpected calcification. Skull: Intact.  No focal lesion. Sinuses/Orbits: Negative. Other: None. IMPRESSION: Hypoattenuation left temporoparietal lobe  has an appearance worrisome for neoplasm. Recommend brain MRI with and without contrast. Electronically Signed   By: Drusilla Kanner M.D.   On: 08/17/2020 16:46   CT HEAD W & WO CONTRAST  Result Date: 08/20/2020 CLINICAL DATA:  Brain mass.  Abnormal CT. EXAM: CT HEAD WITHOUT AND WITH CONTRAST TECHNIQUE: Contiguous axial images were obtained from the base of the skull through the vertex without and with intravenous contrast CONTRAST:  63mL OMNIPAQUE IOHEXOL 300 MG/ML  SOLN COMPARISON:  CT head without contrast 08/17/2020. FINDINGS: Brain: The area of hypoattenuation involving the posterior left frontal lobe and left parietal lobe is again noted. There is some cortical hypoattenuation and cortical sparing. Postcontrast images demonstrate some enhancement within the cortex. No mass lesion is present. No other focal enhancement is present. Moderate atrophy and white matter disease is otherwise stable. Brainstem and cerebellum are within normal limits. Vascular: Atherosclerotic calcifications are present within the cavernous internal carotid arteries. No hyperdense vessel is present. Skull: Calvarium is intact. No focal lytic or blastic lesions are present. No significant extracranial soft tissue lesion is present. Sinuses/Orbits: The paranasal sinuses and mastoid air cells are  clear. The globes and orbits are within normal limits. IMPRESSION: 1. Stable appearance of cortical and subcortical hypoattenuation in the left posterior frontal and parietal lobe. There is some enhancement the cortex without a discrete mass lesion. Neoplasm is considered unlikely. This is concerning for a venous infarct. Arterial infarct considered less likely. Given the patient cannot have MRI, follow-up CT recommended to evaluate for expected evolution. 2. Stable atrophy and white matter disease. This likely reflects the sequela of chronic microvascular ischemia. 3. Atherosclerosis. Electronically Signed   By: Marin Roberts M.D.   On: 08/20/2020 18:01   CT ANGIO NECK W OR WO CONTRAST  Result Date: 08/20/2020 CLINICAL DATA:  Ataxia EXAM: CT ANGIOGRAPHY HEAD AND NECK TECHNIQUE: Multidetector CT imaging of the head and neck was performed using the standard protocol during bolus administration of intravenous contrast. Multiplanar CT image reconstructions and MIPs were obtained to evaluate the vascular anatomy. Carotid stenosis measurements (when applicable) are obtained utilizing NASCET criteria, using the distal internal carotid diameter as the denominator. CONTRAST:  37mL OMNIPAQUE IOHEXOL 350 MG/ML SOLN COMPARISON:  None. FINDINGS: CTA NECK FINDINGS SKELETON: There is no bony spinal canal stenosis. No lytic or blastic lesion. OTHER NECK: Normal pharynx, larynx and major salivary glands. No cervical lymphadenopathy. Unremarkable thyroid gland. UPPER CHEST: No pneumothorax or pleural effusion. No nodules or masses. AORTIC ARCH: There is no calcific atherosclerosis of the aortic arch. There is no aneurysm, dissection or hemodynamically significant stenosis of the visualized portion of the aorta. Conventional 3 vessel aortic branching pattern. The visualized proximal subclavian arteries are widely patent. RIGHT CAROTID SYSTEM: Normal without aneurysm, dissection or stenosis. LEFT CAROTID SYSTEM: Normal  without aneurysm, dissection or stenosis. VERTEBRAL ARTERIES: Left dominant configuration. Both origins are clearly patent. There is no dissection, occlusion or flow-limiting stenosis to the skull base (V1-V3 segments). CTA HEAD FINDINGS POSTERIOR CIRCULATION: --Vertebral arteries: Normal V4 segments. --Inferior cerebellar arteries: Normal. --Basilar artery: Normal. --Superior cerebellar arteries: Normal. --Posterior cerebral arteries (PCA): Normal. ANTERIOR CIRCULATION: --Intracranial internal carotid arteries: Normal. --Anterior cerebral arteries (ACA): Normal. Both A1 segments are present. Patent anterior communicating artery (a-comm). --Middle cerebral arteries (MCA): Normal. ANATOMIC VARIANTS: None Review of the MIP images confirms the above findings. IMPRESSION: Normal CTA of the head and neck. Electronically Signed   By: Deatra Robinson M.D.   On: 08/20/2020 22:31  CT Abdomen Pelvis W Contrast  Result Date: 08/17/2020 CLINICAL DATA:  Abdominal pain. Recent gastric bypass surgery. Positive Hemoccult. EXAM: CT ABDOMEN AND PELVIS WITH CONTRAST TECHNIQUE: Multidetector CT imaging of the abdomen and pelvis was performed using the standard protocol following bolus administration of intravenous contrast. CONTRAST:  162mL OMNIPAQUE IOHEXOL 300 MG/ML  SOLN COMPARISON:  05/24/2020. FINDINGS: Lower chest: No acute abnormality. Hepatobiliary: Liver normal in size. No mass or focal lesion. Status post cholecystectomy. No bile duct dilation. Pancreas: Pancreatic atrophy.  No mass.  No inflammation. Spleen: Prominent spleen, 13 cm in greatest dimension, unchanged. No splenic masses. Adrenals/Urinary Tract: No adrenal masses. Kidneys are normal in overall size and position. Symmetric renal enhancement and excretion. Small mass arises from the posteromedial midpole of the left kidney, 1.3 cm in greatest dimension, increased in size from the prior CT where it measured 1 cm. Mass has average Hounsfield units of 54. No other  renal masses, no stones and no hydronephrosis. Ureters are normal in course and in caliber. Bladder is unremarkable. Stomach/Bowel: Stomach demonstrates postsurgical changes. Small gastric remnant. Stomach appears to into the into a gastrojejunostomy. Duodenum appears ligated, lying porta hepatis. Small bowel and colon are normal in caliber. No wall thickening. No evidence of inflammation. Vascular/Lymphatic: No significant vascular findings are present. No enlarged abdominal or pelvic lymph nodes. Reproductive: Status post hysterectomy. No adnexal masses. Other: No abdominal wall hernia. Scarring in the midline upper abdomen where there was a small hernia on the prior exam. No ascites. Musculoskeletal: No fracture or acute finding. No bone lesion. Stable spine stimulator leads along the posterior lower thoracic spinal canal. IMPRESSION: 1. No acute abnormalities within the abdomen or pelvis. No evidence of bowel obstruction or inflammation. 2. 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 No evidence of a surgery complication. 3. Previously seen ventral hernia has been repaired. No residual or new abdominal wall hernia. 4. Small left renal cortical mass, posteromedial midpole, which is larger than prior exam. This could reflect a small renal neoplasm. Recommend follow-up renal MRI without and with contrast, if this patient can tolerate that procedure. If patient cannot have MRI, recommend follow-up CT the abdomen without and with contrast in 3-6 months to assess for stability. Electronically Signed   By: Lajean Manes M.D.   On: 08/17/2020 16:48   DG CHEST PORT 1 VIEW  Result Date: 08/20/2020 CLINICAL DATA:  Dyspnea EXAM: PORTABLE CHEST 1 VIEW COMPARISON:  08/17/2020 chest radiograph. FINDINGS: Inferior approach spinal stimulator leads terminate over the the lower thoracic spinal canal. Surgical clips in the upper abdomen bilaterally. Stable cardiomediastinal  silhouette with normal heart size. No pneumothorax. No pleural effusion. Lungs appear clear, with no acute consolidative airspace disease and no pulmonary edema. IMPRESSION: No active disease. Electronically Signed   By: Ilona Sorrel M.D.   On: 08/20/2020 09:10   DG Chest Port 1 View  Result Date: 08/17/2020 CLINICAL DATA:  Altered mental status and fevers EXAM: PORTABLE CHEST 1 VIEW COMPARISON:  12/11/2012 FINDINGS: Cardiac shadow is within normal limits. Postsurgical changes are again seen and stable. The lungs are well aerated without focal infiltrate. No acute bony abnormality is seen. IMPRESSION: No acute abnormality noted. Electronically Signed   By: Inez Catalina M.D.   On: 08/17/2020 18:48   ECHOCARDIOGRAM COMPLETE  Result Date: 08/21/2020    ECHOCARDIOGRAM REPORT   Patient Name:   VALBONA HICKEN Date of Exam: 08/21/2020 Medical Rec #:  [id number]  Height:       66.0 in Accession #:    [id number]        Weight:       220.0 lb Date of Birth:  09/09/1950         BSA:          2.083 m Patient Age:    70 years          BP:           162/94 mmHg Patient Gender: F                 HR:           64 bpm. Exam Location:  Inpatient Procedure: 2D Echo Indications:    stroke  History:        Patient has no prior history of Echocardiogram examinations.  Sonographer:    Johny Chess Referring Phys: [id number] ASHISH ARORA  Sonographer Comments: No subcostal window. IMPRESSIONS  1. Left ventricular ejection fraction, by estimation, is 60 to 65%. The left ventricle has normal function. The left ventricle has no regional wall motion abnormalities. Left ventricular diastolic parameters were normal.  2. Right ventricular systolic function is normal. The right ventricular size is normal.  3. Left atrial size was mildly dilated.  4. The mitral valve is normal in structure. No evidence of mitral valve regurgitation. No evidence of mitral stenosis.  5. The aortic valve is normal in structure. Aortic valve  regurgitation is not visualized. No aortic stenosis is present.  6. The inferior vena cava is normal in size with greater than 50% respiratory variability, suggesting right atrial pressure of 3 mmHg. FINDINGS  Left Ventricle: Left ventricular ejection fraction, by estimation, is 60 to 65%. The left ventricle has normal function. The left ventricle has no regional wall motion abnormalities. The left ventricular internal cavity size was normal in size. There is  no left ventricular hypertrophy. Left ventricular diastolic parameters were normal. Normal left ventricular filling pressure. Right Ventricle: The right ventricular size is normal. No increase in right ventricular wall thickness. Right ventricular systolic function is normal. Left Atrium: Left atrial size was mildly dilated. Right Atrium: Right atrial size was normal in size. Pericardium: There is no evidence of pericardial effusion. Mitral Valve: The mitral valve is normal in structure. Mild mitral annular calcification. No evidence of mitral valve regurgitation. No evidence of mitral valve stenosis. Tricuspid Valve: The tricuspid valve is normal in structure. Tricuspid valve regurgitation is not demonstrated. No evidence of tricuspid stenosis. Aortic Valve: The aortic valve is normal in structure. Aortic valve regurgitation is not visualized. No aortic stenosis is present. Pulmonic Valve: The pulmonic valve was normal in structure. Pulmonic valve regurgitation is not visualized. No evidence of pulmonic stenosis. Aorta: The aortic root is normal in size and structure. Venous: The inferior vena cava is normal in size with greater than 50% respiratory variability, suggesting right atrial pressure of 3 mmHg. IAS/Shunts: No atrial level shunt detected by color flow Doppler.  LEFT VENTRICLE PLAX 2D LVIDd:         4.00 cm  Diastology LVIDs:         2.10 cm  LV e' medial:    7.51 cm/s LV PW:         1.00 cm  LV E/e' medial:  11.9 LV IVS:        1.10 cm  LV e'  lateral:   10.30 cm/s LVOT diam:     1.90 cm  LV  E/e' lateral: 8.7 LV SV:         82 LV SV Index:   39 LVOT Area:     2.84 cm  RIGHT VENTRICLE RV S prime:     18.50 cm/s TAPSE (M-mode): 1.9 cm LEFT ATRIUM             Index       RIGHT ATRIUM           Index LA diam:        4.10 cm 1.97 cm/m  RA Area:     12.50 cm LA Vol (A2C):   61.7 ml 29.63 ml/m RA Volume:   26.40 ml  12.68 ml/m LA Vol (A4C):   56.0 ml 26.89 ml/m LA Biplane Vol: 59.0 ml 28.33 ml/m  AORTIC VALVE LVOT Vmax:   135.00 cm/s LVOT Vmean:  86.900 cm/s LVOT VTI:    0.289 m  AORTA Ao Root diam: 3.00 cm Ao Asc diam:  3.10 cm MITRAL VALVE MV Area (PHT): 2.95 cm    SHUNTS MV Decel Time: 257 msec    Systemic VTI:  0.29 m MV E velocity: 89.40 cm/s  Systemic Diam: 1.90 cm MV A velocity: 88.80 cm/s MV E/A ratio:  1.01 Mihai Croitoru MD Electronically signed by Sanda Klein MD Signature Date/Time: 08/21/2020/1:14:10 PM    Final    CT VENOGRAM HEAD  Result Date: 08/20/2020 CLINICAL DATA:  Ataxia EXAM: CT VENOGRAM HEAD TECHNIQUE: Venographic phase images of the brain were obtained following the administration of IV contrast agent. CONTRAST:  73mL OMNIPAQUE IOHEXOL 350 MG/ML SOLN COMPARISON:  Head CT with and without contrast 08/20/2020 FINDINGS: Superior sagittal sinus: Normal. Straight sinus: Normal. Inferior sagittal sinus, vein of Galen and internal cerebral veins: Normal. Transverse sinuses: Normal. Sigmoid sinuses: Normal. Visualized jugular veins: Normal. There is leptomeningeal contrast enhancement over the area abnormal density in the left parietal lobe. IMPRESSION: 1. No dural venous sinus thrombosis. 2. Enhancing leptomeningeal contrast over the area of abnormal density in the left parietal lobe, consistent with subacute infarct. Electronically Signed   By: Ulyses Jarred M.D.   On: 08/20/2020 23:03     The results of significant diagnostics from this hospitalization (including imaging, microbiology, ancillary and laboratory) are listed below  for reference.     Microbiology: Recent Results (from the past 240 hour(s))  Blood culture (routine x 2)     Status: None   Collection Time: 08/17/20  3:51 PM   Specimen: BLOOD RIGHT HAND  Result Value Ref Range Status   Specimen Description BLOOD RIGHT HAND  Final   Special Requests   Final    Blood Culture adequate volume BOTTLES DRAWN AEROBIC AND ANAEROBIC   Culture   Final    NO GROWTH 5 DAYS Performed at [facility], [address], [place], [place] [address]    Report Status 08/22/2020 FINAL  Final  Blood culture (routine x 2)     Status: None   Collection Time: 08/17/20  3:51 PM   Specimen: BLOOD  Result Value Ref Range Status   Specimen Description BLOOD LEFT ANTECUBITAL  Final   Special Requests   Final    Blood Culture adequate volume BOTTLES DRAWN AEROBIC AND ANAEROBIC   Culture   Final    NO GROWTH 5 DAYS Performed at [facility], [address], [place], [place] [address]    Report Status 08/22/2020 FINAL  Final  Urine culture     Status: None   Collection Time: 08/17/20  9:00  PM   Specimen: Urine, Random  Result Value Ref Range Status   Specimen Description   Final    URINE, RANDOM Performed at [facility], [address] [place], [place] [address]    Special Requests   Final    NONE Performed at [facility], [address], [place], [place] [address]    Culture   Final    NO GROWTH Performed at [facility], [place] [address], [place], [place] [address]    Report Status 08/19/2020 FINAL  Final  SARS CORONAVIRUS 2 (TAT 6-24 HRS) Nasopharyngeal Nasopharyngeal Swab     Status: None   Collection Time: 08/17/20  9:39 PM   Specimen: Nasopharyngeal Swab  Result Value Ref Range Status   SARS Coronavirus 2 NEGATIVE NEGATIVE Final    Comment: (NOTE) SARS-CoV-2 target nucleic acids are NOT DETECTED.  The SARS-CoV-2 RNA is generally detectable in upper and lower respiratory specimens during the acute phase of infection. Negative results do not preclude  SARS-CoV-2 infection, do not rule out co-infections with other pathogens, and should not be used as the sole basis for treatment or other patient management decisions. Negative results must be combined with clinical observations, patient history, and epidemiological information. The expected result is Negative.  Fact Sheet for Patients: SugarRoll.be  Fact Sheet for Healthcare Providers: https://www.woods-mathews.com/  This test is not yet approved or cleared by the [place] FDA and  has been authorized for detection and/or diagnosis of SARS-CoV-2 by FDA under an Emergency Use Authorization (EUA). This EUA will remain  in effect (meaning this test can be used) for the duration of the COVID-19 declaration under Se ction 564(b)(1) of the Act, 21 U.S.C. section 360bbb-3(b)(1), unless the authorization is terminated or revoked sooner.  Performed at [facility], [place] [address], [place], [place] [address]   MRSA PCR Screening     Status: None   Collection Time: 08/19/20  9:30 AM   Specimen: Nasopharyngeal  Result Value Ref Range Status   MRSA by PCR NEGATIVE NEGATIVE Final    Comment:        The GeneXpert MRSA Assay (FDA approved for NASAL specimens only), is one component of a comprehensive MRSA colonization surveillance program. It is not intended to diagnose MRSA infection nor to guide or monitor treatment for MRSA infections. Performed at [facility], [address] [place], [place] [address]      Labs: BNP (last 3 results) No results for input(s): BNP in the last 8760 hours. Basic Metabolic Panel: Recent Labs  Lab 08/21/20 0238 08/22/20 0716 08/24/20 0307  NA 137 139 137  K 3.8 3.6 3.4*  CL 105 107 105  CO2 25 26 24   GLUCOSE 98 99 116*  BUN 5* 9 8  CREATININE 0.70 0.77 0.89  CALCIUM 8.7* 8.9 8.8*   Liver Function Tests: Recent Labs  Lab 08/21/20 0238 08/22/20 0716 08/24/20 0307  AST 32 23 19  ALT 20  18 15   ALKPHOS 48 47 51  BILITOT 0.9 1.1 1.1  PROT 5.6* 5.6* 5.6*  ALBUMIN 2.7* 2.8* 2.8*   No results for input(s): LIPASE, AMYLASE in the last 168 hours. No results for input(s): AMMONIA in the last 168 hours. CBC: Recent Labs  Lab 08/21/20 0238 08/22/20 0716 08/24/20 0307  WBC 3.7* 3.2* 3.5*  HGB 10.5* 10.5* 10.4*  HCT 32.1* 32.0* 31.7*  MCV 88.9 89.6 88.8  PLT 151 145* 164   Cardiac Enzymes: No results for input(s): CKTOTAL, CKMB, CKMBINDEX,  TROPONINI in the last 168 hours. BNP: Invalid input(s): POCBNP CBG: Recent Labs  Lab 08/25/20 1210 08/26/20 0023 08/26/20 1239 08/26/20 2351 08/27/20 0624  GLUCAP 97 94 138* 115* 89   D-Dimer No results for input(s): DDIMER in the last 72 hours. Hgb A1c No results for input(s): HGBA1C in the last 72 hours. Lipid Profile No results for input(s): CHOL, HDL, LDLCALC, TRIG, CHOLHDL, LDLDIRECT in the last 72 hours. Thyroid function studies No results for input(s): TSH, T4TOTAL, T3FREE, THYROIDAB in the last 72 hours.  Invalid input(s): FREET3 Anemia work up No results for input(s): VITAMINB12, FOLATE, FERRITIN, TIBC, IRON, RETICCTPCT in the last 72 hours. Urinalysis    Component Value Date/Time   COLORURINE YELLOW 08/17/2020 2100   APPEARANCEUR CLEAR 08/17/2020 2100   LABSPEC 1.042 (H) 08/17/2020 2100   PHURINE 6.0 08/17/2020 2100   GLUCOSEU NEGATIVE 08/17/2020 2100   HGBUR SMALL (A) 08/17/2020 2100   BILIRUBINUR NEGATIVE 08/17/2020 2100   KETONESUR NEGATIVE 08/17/2020 2100   PROTEINUR NEGATIVE 08/17/2020 2100   NITRITE NEGATIVE 08/17/2020 2100   LEUKOCYTESUR NEGATIVE 08/17/2020 2100   Sepsis Labs Invalid input(s): PROCALCITONIN,  WBC,  LACTICIDVEN Microbiology Recent Results (from the past 240 hour(s))  Blood culture (routine x 2)     Status: None   Collection Time: 08/17/20  3:51 PM   Specimen: BLOOD RIGHT HAND  Result Value Ref Range Status   Specimen Description BLOOD RIGHT HAND  Final   Special Requests    Final    Blood Culture adequate volume BOTTLES DRAWN AEROBIC AND ANAEROBIC   Culture   Final    NO GROWTH 5 DAYS Performed at [facility], [address], [place], [place] [address]    Report Status 08/22/2020 FINAL  Final  Blood culture (routine x 2)     Status: None   Collection Time: 08/17/20  3:51 PM   Specimen: BLOOD  Result Value Ref Range Status   Specimen Description BLOOD LEFT ANTECUBITAL  Final   Special Requests   Final    Blood Culture adequate volume BOTTLES DRAWN AEROBIC AND ANAEROBIC   Culture   Final    NO GROWTH 5 DAYS Performed at [facility], [address] [place], [place] [address]    Report Status 08/22/2020 FINAL  Final  Urine culture     Status: None   Collection Time: 08/17/20  9:00 PM   Specimen: Urine, Random  Result Value Ref Range Status   Specimen Description   Final    URINE, RANDOM Performed at [facility], [address], [place], [place] [address]    Special Requests   Final    NONE Performed at [facility], [address] [place], [place] [address]    Culture   Final    NO GROWTH Performed at [facility], [place] [address] [place], [place] [address]    Report Status 08/19/2020 FINAL  Final  SARS CORONAVIRUS 2 (TAT 6-24 HRS) Nasopharyngeal Nasopharyngeal Swab     Status: None   Collection Time: 08/17/20  9:39 PM   Specimen: Nasopharyngeal Swab  Result Value Ref Range Status   SARS Coronavirus 2 NEGATIVE NEGATIVE Final    Comment: (NOTE) SARS-CoV-2 target nucleic acids are NOT DETECTED.  The SARS-CoV-2 RNA is generally detectable in upper and lower respiratory specimens during the acute phase of infection. Negative results do not preclude SARS-CoV-2 infection, do not rule out co-infections with other pathogens, and should not be used as the sole basis for treatment or  other patient management decisions. Negative results must be combined with clinical observations, patient history, and epidemiological information. The  expected result is Negative.  Fact Sheet for Patients: SugarRoll.be  Fact Sheet for Healthcare Providers: https://www.woods-mathews.com/  This test is not yet approved or cleared by the [place] FDA and  has been authorized for detection and/or diagnosis of SARS-CoV-2 by FDA under an Emergency Use Authorization (EUA). This EUA will remain  in effect (meaning this test can be used) for the duration of the COVID-19 declaration under Se ction 564(b)(1) of the Act, 21 U.S.C. section 360bbb-3(b)(1), unless the authorization is terminated or revoked sooner.  Performed at [facility], [place] [address], [place], [place] [address]   MRSA PCR Screening     Status: None   Collection Time: 08/19/20  9:30 AM   Specimen: Nasopharyngeal  Result Value Ref Range Status   MRSA by PCR NEGATIVE NEGATIVE Final    Comment:        The GeneXpert MRSA Assay (FDA approved for NASAL specimens only), is one component of a comprehensive MRSA colonization surveillance program. It is not intended to diagnose MRSA infection nor to guide or monitor treatment for MRSA infections. Performed at [facility], [address] [place], [place] [address]      Time coordinating discharge in minutes: 65  SIGNED:   Debbe Odea, MD  [facility] 08/27/2020, 2:11 PM

## 2020-08-28 DIAGNOSIS — R059 Cough, unspecified: Secondary | ICD-10-CM

## 2020-08-28 DIAGNOSIS — F418 Other specified anxiety disorders: Secondary | ICD-10-CM | POA: Diagnosis not present

## 2020-08-28 DIAGNOSIS — N2889 Other specified disorders of kidney and ureter: Secondary | ICD-10-CM | POA: Diagnosis not present

## 2020-08-28 LAB — CBC
HCT: 36.2 % (ref 36.0–46.0)
Hemoglobin: 11.8 g/dL — ABNORMAL LOW (ref 12.0–15.0)
MCH: 29.4 pg (ref 26.0–34.0)
MCHC: 32.6 g/dL (ref 30.0–36.0)
MCV: 90.3 fL (ref 80.0–100.0)
Platelets: 256 10*3/uL (ref 150–400)
RBC: 4.01 MIL/uL (ref 3.87–5.11)
RDW: 16.1 % — ABNORMAL HIGH (ref 11.5–15.5)
WBC: 5.1 10*3/uL (ref 4.0–10.5)
nRBC: 0 % (ref 0.0–0.2)

## 2020-08-28 LAB — RESP PANEL BY RT-PCR (FLU A&B, COVID) ARPGX2
Influenza A by PCR: NEGATIVE
Influenza B by PCR: NEGATIVE
SARS Coronavirus 2 by RT PCR: NEGATIVE

## 2020-08-28 MED ORDER — GUAIFENESIN-DM 100-10 MG/5ML PO SYRP: 5.0000 mL | ORAL_SOLUTION | Freq: Four times a day (QID) | ORAL | Status: DC

## 2020-08-28 MED ORDER — HYDROCOD POLST-CPM POLST ER 10-8 MG/5ML PO SUER
5.0000 mL | Freq: Every day | ORAL | Status: DC
  Administered 2020-08-28 – 2020-09-04 (×8): 5 mL via ORAL
  Filled 2020-08-28 (×8): qty 5

## 2020-08-28 MED ORDER — HYDROCOD POLST-CPM POLST ER 10-8 MG/5ML PO SUER
5.0000 mL | Freq: Two times a day (BID) | ORAL | Status: DC | PRN
  Administered 2020-08-29 – 2020-09-04 (×9): 5 mL via ORAL
  Filled 2020-08-28 (×9): qty 5

## 2020-08-28 MED ORDER — GUAIFENESIN 100 MG/5ML PO SOLN
5.0000 mL | ORAL | Status: DC | PRN
  Administered 2020-08-28 (×2): 100 mg via ORAL
  Filled 2020-08-28 (×2): qty 5

## 2020-08-28 MED ORDER — TIZANIDINE HCL 4 MG PO TABS
4.0000 mg | ORAL_TABLET | Freq: Three times a day (TID) | ORAL | Status: DC | PRN
  Administered 2020-08-28 – 2020-09-04 (×14): 4 mg via ORAL
  Filled 2020-08-28 (×14): qty 1

## 2020-08-28 MED ORDER — GUAIFENESIN-DM 100-10 MG/5ML PO SYRP
5.0000 mL | ORAL_SOLUTION | Freq: Three times a day (TID) | ORAL | Status: DC
  Administered 2020-08-28 (×2): 5 mL via ORAL
  Filled 2020-08-28 (×2): qty 5

## 2020-08-28 MED ORDER — SERTRALINE HCL 50 MG PO TABS
50.0000 mg | ORAL_TABLET | Freq: Every day | ORAL | Status: DC
  Administered 2020-08-29 – 2020-09-05 (×8): 50 mg via ORAL
  Filled 2020-08-28 (×8): qty 1

## 2020-08-28 MED ORDER — GUAIFENESIN ER 600 MG PO TB12
600.0000 mg | ORAL_TABLET | Freq: Two times a day (BID) | ORAL | Status: DC
  Administered 2020-08-28 – 2020-09-05 (×16): 600 mg via ORAL
  Filled 2020-08-28 (×16): qty 1

## 2020-08-28 NOTE — TOC Progression Note (Signed)
Transition of Care [facility]) - Progression Note    Patient Details  Name: Morgan Dixon MRN: [id number] Date of Birth: [birth year]  Transition of Care [facility]) CM/SW [place], [place] Phone Number: 08/28/2020, 11:53 AM  Clinical Narrative:    CSW spoke with [place] in [place] who noted pt is being reviewed but admissions were not available on the weekend. CSW left VM for admissions Liaison, who will reach out tomorrow with any further information. SW will continue to follow for DC planning.   Expected Discharge Plan: [place] Barriers to Discharge: Continued Medical Work up,Insurance Authorization  Expected Discharge Plan and Services Expected Discharge Plan: [place] In-house Referral: Clinical Social Work   Post Acute Care Choice: [place] Living arrangements for the past 2 months: Single Family Home Expected Discharge Date: 08/27/20                                     Social Determinants of Health (SDOH) Interventions    Readmission Risk Interventions Readmission Risk Prevention Plan 08/19/2020  Medication Screening Complete  Transportation Screening Complete  Some recent data might be hidden

## 2020-08-28 NOTE — Progress Notes (Signed)
Occupational Therapy Treatment Patient Details Name: Morgan Dixon MRN: [id number] DOB: 1951-01-29 Today's Date: 08/28/2020    History of present illness Pt is 70 yo female who presents to [facility] with AMS and dysphasia with CT showing L parietal hypodensity concerning for CVA vs mass as well as L renal mass. Pt also with abdominal wound and possible GI bleed. PMH: gastric bypass surgery, HTN, chronic back pain with spinal cord stimulator.   OT comments  Received new OT evaluation orders, pt without change in status, will continue current plan of care. Pt received in bed upon OT arrival, she was agreeable and highly motivated for OT session this date. Pt continued to demonstrate word finding difficulties this date and expressed great frustration with communication limitations. Pt also expressed frustration with RUE weakness and difficulty with feeding due to decreased coordination. Pt completed BUE therex with level 2 theraband while sitting EOB for strengthening RUE for improved skills with ADL completion. Pt continues to require moderate assistance for LB dressing, supervision at RW level for functional mobility. Pt demonstrates improved safety awareness reaching for RW prior to mobility. Pt will continue to benefit from skilled OT services to maximize safety and independence with ADL/IADL and functional mobility. Will continue to follow acutely and progress as tolerated.    Follow Up Recommendations  SNF    Equipment Recommendations  3 in 1 bedside commode    Recommendations for Other Services      Precautions / Restrictions Precautions Precautions: Fall Precaution Comments: airborn/contact precautions as COVID is ruled out Restrictions Weight Bearing Restrictions: No       Mobility Bed Mobility Overal bed mobility: Needs Assistance Bed Mobility: Supine to Sit     Supine to sit: Modified independent (Device/Increase time)     General bed mobility comments: with HOB elevated,  pt using bed rails    Transfers Overall transfer level: Needs assistance Equipment used: Rolling walker (2 wheeled) Transfers: Sit to/from Stand Sit to Stand: Min guard Stand pivot transfers: Min assist       General transfer comment: min guard for safety    Balance Overall balance assessment: Needs assistance Sitting-balance support: No upper extremity supported;Feet supported Sitting balance-Leahy Scale: Fair Sitting balance - Comments: slumped posture EOB;no loss of balance or instability when reaching outside base of support   Standing balance support: No upper extremity supported;During functional activity Standing balance-Leahy Scale: Fair Standing balance comment: improved stability with UE support, no loss of balance noted during session                           ADL either performed or assessed with clinical judgement   ADL Overall ADL's : Needs assistance/impaired Eating/Feeding: Set up;Sitting Eating/Feeding Details (indicate cue type and reason): able to self feed with increased time due to coordination limitations Grooming: Supervision/safety;Standing Grooming Details (indicate cue type and reason): at sink level                 Toilet Transfer: Supervision/safety;RW (simulated to recliner)   Toileting- Clothing Manipulation and Hygiene: Supervision/safety;Sit to/from stand Toileting - Clothing Manipulation Details (indicate cue type and reason): simulated     Functional mobility during ADLs: Supervision/safety;Rolling walker General ADL Comments: supervision for safety, no loss of balance noted, pt with improved safe use of RW     Vision       Perception     Praxis      Cognition Arousal/Alertness: Awake/alert Behavior During Therapy:  WFL for tasks assessed/performed Overall Cognitive Status: No family/caregiver present to determine baseline cognitive functioning                                 General Comments:  word finding difficulty throughout session. Pt verbalized frustration with word finding difficulties, but frequently paused and was able to complete her sentences and thoughts. Pt with improvements with safety awareness asking for RW prior to mobility. Pt demonstrated difficulty following multistep directions with therex, required step by step instructions with hands on assistance and visual demonstrations. Pt with decreased short term memory limitations as well.        Exercises Exercises: General Upper Extremity;Other exercises General Exercises - Upper Extremity Shoulder Flexion: Both;10 reps;Seated;Theraband;Strengthening Theraband Level (Shoulder Flexion): Level 2 (Red) Shoulder Extension: Both;10 reps;Seated;Theraband;Strengthening Theraband Level (Shoulder Extension): Level 2 (Red) Shoulder ABduction: Both;10 reps;Seated;Theraband;Strengthening Theraband Level (Shoulder Abduction): Level 2 (Red) Shoulder ADduction: Both;10 reps;Seated;Theraband;Strengthening Theraband Level (Shoulder Adduction): Level 2 (Red) Elbow Flexion: Both;10 reps;Seated;Theraband;Strengthening Theraband Level (Elbow Flexion): Level 2 (Red) Elbow Extension: Both;10 reps;Seated;Theraband;Strengthening Theraband Level (Elbow Extension): Level 2 (Red) Other Exercises Other Exercises: educated pt on fine motor coordination exercises and provided pt with theraputty and written handout.   Shoulder Instructions       General Comments VSS on RA    Pertinent Vitals/ Pain       Pain Assessment: No/denies pain Pain Intervention(s): Monitored during session  Home Living                                          Prior Functioning/Environment              Frequency  Min 2X/week        Progress Toward Goals  OT Goals(current goals can now be found in the care plan section)  Progress towards OT goals: Progressing toward goals  Acute Rehab OT Goals Patient Stated Goal: to get  better OT Goal Formulation: With patient Time For Goal Achievement: 09/05/20 Potential to Achieve Goals: Good ADL Goals Pt Will Perform Grooming: with modified independence;standing Pt Will Perform Lower Body Dressing: with modified independence;sit to/from stand Pt Will Transfer to Toilet: with modified independence;ambulating;regular height toilet Pt Will Perform Toileting - Clothing Manipulation and hygiene: with modified independence;sitting/lateral leans;sit to/from stand Additional ADL Goal #1: Pt will demonstrate increased activity tolerance to perform three grooming standing at sink with Supervision  Plan Discharge plan remains appropriate    Co-evaluation                 AM-PAC OT "6 Clicks" Daily Activity     Outcome Measure   Help from another person eating meals?: None Help from another person taking care of personal grooming?: A Little Help from another person toileting, which includes using toliet, bedpan, or urinal?: A Little Help from another person bathing (including washing, rinsing, drying)?: A Little Help from another person to put on and taking off regular upper body clothing?: A Little Help from another person to put on and taking off regular lower body clothing?: A Lot 6 Click Score: 18    End of Session Equipment Utilized During Treatment: Rolling walker  OT Visit Diagnosis: Other abnormalities of gait and mobility (R26.89);Unsteadiness on feet (R26.81);Muscle weakness (generalized) (M62.81)   Activity Tolerance Patient tolerated treatment well   Patient Left with  call bell/phone within reach;in chair (RN verbalized okay to leave chair alarm off)   Nurse Communication Mobility status        Time: 8341-9622 OT Time Calculation (min): 52 min  Charges: OT General Charges $OT Visit: 1 Visit OT Treatments $Self Care/Home Management : 23-37 mins $Therapeutic Exercise: 8-22 mins  Helene Kelp OTR/L Acute Rehabilitation Services Office:  [place] 08/28/2020, 12:01 PM

## 2020-08-28 NOTE — Progress Notes (Addendum)
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  She was found to have a left parietal subacute infarct.  Subjective: She has a cough which developed yesterday. It sounds productive but she is not producing any sputum. She has a post nasal drip. Cough is severe at night. No fever/ chills or chest pain. 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 - Unable to have an MRI due to spinal stimulator - - CT venogram > Enhancing leptomeningeal contrast over the area of abnormal density in the left parietal lobe, consistent with subacute infarct.  - 2 D ECHO> no thrombus -Neurology feels CVA may be embolic and recommends a loop recorder and aspirin and Plavix for 3 weeks followed by Plavix alone - LDL 88, goal less than 70- statin has been started - Plan is for her to transition to skilled nursing facility for rehab  - patient needs a Zio patch on day of discharge and EP will need to be called   Active Problems: Hypertension - Home blood pressure meds include HCTZ and Catapres - Currently on Amlodipine  Cough- starting 5/14 - check COVID- per RN, the was a COVID patient in a room near her - adding Guaifenesin/ Dextromethorphan TID and Tussionex at bedtime - lungs clear, no leukocytosis or fevers noted yet- follow  Heme positive stool- multiple prior surgeries - 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 -s/p 2021 venting gastrostomy tube (now removed) - status  post laparoscopic distal gastrectomy 3/29 status post incisional hernia repair on 4/4 - GI was consulted while the patient was at [facility] and preferred not to do a work-up in the setting of acute neurological issue - She is now on aspirin and Plavix which should not be discontinued -Hemoglobin has remained stable since admission -  Hemoccult repeated on 5/14 and found to be negative  Anterior abdominal wall wound from prior PEG tube - she presented to the hospital with this - Continue wound care recommendations- healing well and nearly closed - wound nearly healed but patient wants oxycodone for the pain- have d/c'd this in favor of Tylenol which is working well for her  Anxiety- husband passed away in 05/09/20 - Resume Zoloft - increase to 50 mg as Wellbutrin weaned off- she is in agreement with this plan - Wellbutrin being resumed at 1/2 dose (was on 300 BID) to prevent withdrawal symptoms- as it can increase seizure risk, it should be stopped but will need to be weaned off  Acute metabolic encephalopathy- ? dementia - noted while at [facility] - has resolved - I am unsure of the etiology- - per daughter, the patient has had memory issues which started in 2012 and sometimes asks the same questions twice- no formal neurological eval  - I have not noted any memory deficits & I find her to be a good historian  Obesity Body mass index is 35.51 kg/m. - s/p gastric bypass    Left renal mass -CT abd/pelvis >  Small left renal cortical mass, posteromedial midpole, which is larger than prior exam. This could reflect a small renal neoplasm. - Recommend CT abdomen with and without contrast in 3 to 6 months to assess  -patient is unable to have an MRI due to spinal stimulator  H/o DVT with PE   Time spent in minutes: 35-   DVT prophylaxis: SCDs Start: 08/18/20 0050  Code Status: Full code Family Communication: daughter Sheralyn Boatman Level of Care: Level of care:  Med-Surg Disposition Plan:  Status is: Inpatient  Remains inpatient appropriate because:Unsafe d/c plan   Dispo: The patient is from: Home              Anticipated d/c is to: SNF              Patient currently is medically stable to d/c.   Difficult to place patient Yes      Consultants:   neurology Procedures:   none Antimicrobials:  Anti-infectives (From admission, onward)   Start     Dose/Rate Route Frequency Ordered Stop   08/17/20 2130  cefTRIAXone (ROCEPHIN) 1 g in sodium chloride 0.9 % 100 mL IVPB  Status:  Discontinued        1 g 200 mL/hr over 30 Minutes Intravenous Every 24 hours 08/17/20 2115 08/19/20 0955       Objective: Vitals:   08/27/20 2316 08/28/20 0342 08/28/20 0742 08/28/20 1127  BP: 129/68 (!) 126/56 (!) 139/102 132/70  Pulse: 60 65 69 75  Resp: 17 18 18 16   Temp: 98.1 F (36.7 C) 98.2 F (36.8 C) 98.4 F (36.9 C) 98.3 F (36.8 C)  TempSrc: Axillary Oral Oral Oral  SpO2: 98% 99% 97% 98%  Weight:      Height:        Intake/Output Summary (Last 24 hours) at 08/28/2020 1136 Last data filed at 08/28/2020 0942 Gross per 24 hour  Intake 630 ml  Output 2100 ml  Net -1470 ml   Filed Weights   08/17/20 1352 08/19/20 0328  Weight: 97.5 kg 99.8 kg    Examination: General exam: Appears comfortable  HEENT: PERRLA, oral mucosa moist, no sclera icterus or thrush Respiratory system: Clear to auscultation. Respiratory effort normal. Cardiovascular system: S1 & S2 heard, regular rate and rhythm Gastrointestinal system: Abdomen soft, non-tender, nondistended. Normal bowel sounds   Central nervous system: Alert and oriented. Left hand and arm 4/5, expressive aphasia Extremities: No cyanosis, clubbing or edema Skin: No rashes or ulcers Psychiatry:  tearful when speaking about her husband    Data Reviewed: I have personally reviewed following labs and imaging studies  CBC: Recent Labs  Lab 08/22/20 0716 08/24/20 0307 08/28/20 0823  WBC  3.2* 3.5* 5.1  HGB 10.5* 10.4* 11.8*  HCT 32.0* 31.7* 36.2  MCV 89.6 88.8 90.3  PLT 145* 164 338   Basic Metabolic Panel: Recent Labs  Lab 08/22/20 0716 08/24/20 0307  NA 139 137  K 3.6 3.4*  CL 107 105  CO2 26 24  GLUCOSE 99 116*  BUN 9 8  CREATININE 0.77 0.89  CALCIUM 8.9 8.8*   GFR: Estimated Creatinine Clearance: 71.1 mL/min (by C-G formula based on SCr of 0.89 mg/dL). Liver Function Tests: Recent Labs  Lab 08/22/20 0716 08/24/20 0307  AST 23 19  ALT 18 15  ALKPHOS 47 51  BILITOT 1.1 1.1  PROT 5.6* 5.6*  ALBUMIN 2.8* 2.8*   No results for input(s): LIPASE, AMYLASE in the last 168 hours. No results for input(s):  AMMONIA in the last 168 hours. Coagulation Profile: No results for input(s): INR, PROTIME in the last 168 hours. Cardiac Enzymes: No results for input(s): CKTOTAL, CKMB, CKMBINDEX, TROPONINI in the last 168 hours. BNP (last 3 results) No results for input(s): PROBNP in the last 8760 hours. HbA1C: No results for input(s): HGBA1C in the last 72 hours. CBG: Recent Labs  Lab 08/25/20 1210 08/26/20 0023 08/26/20 1239 08/26/20 2351 08/27/20 0624  GLUCAP 97 94 138* 115* 89   Lipid Profile: No results for input(s): CHOL, HDL, LDLCALC, TRIG, CHOLHDL, LDLDIRECT in the last 72 hours. Thyroid Function Tests: No results for input(s): TSH, T4TOTAL, FREET4, T3FREE, THYROIDAB in the last 72 hours. Anemia Panel: No results for input(s): VITAMINB12, FOLATE, FERRITIN, TIBC, IRON, RETICCTPCT in the last 72 hours. Urine analysis:    Component Value Date/Time   COLORURINE YELLOW 08/17/2020 2100   APPEARANCEUR CLEAR 08/17/2020 2100   LABSPEC 1.042 (H) 08/17/2020 2100   PHURINE 6.0 08/17/2020 2100   GLUCOSEU NEGATIVE 08/17/2020 2100   HGBUR SMALL (A) 08/17/2020 2100   [place] NEGATIVE 08/17/2020 2100   [place] NEGATIVE 08/17/2020 2100   PROTEINUR NEGATIVE 08/17/2020 2100   NITRITE NEGATIVE 08/17/2020 2100   LEUKOCYTESUR NEGATIVE 08/17/2020 2100    Sepsis Labs: @LABRCNTIP (procalcitonin:4,lacticidven:4) ) Recent Results (from the past 240 hour(s))  MRSA PCR Screening     Status: None   Collection Time: 08/19/20  9:30 AM   Specimen: Nasopharyngeal  Result Value Ref Range Status   MRSA by PCR NEGATIVE NEGATIVE Final    Comment:        The GeneXpert MRSA Assay (FDA approved for NASAL specimens only), is one component of a comprehensive MRSA colonization surveillance program. It is not intended to diagnose MRSA infection nor to guide or monitor treatment for MRSA infections. Performed at [facility], [address], [place], [place] [address]          Radiology Studies: No results found.    Scheduled Meds: . amLODipine  5 mg Oral Daily  . aspirin  81 mg Oral Daily  . atorvastatin  40 mg Oral Daily  . bisacodyl  10 mg Rectal Once  . buPROPion  300 mg Oral Daily  . carbamide peroxide  5 drop Both EARS BID  . chlorpheniramine-HYDROcodone  5 mL Oral QHS  . clopidogrel  75 mg Oral Q breakfast  . guaiFENesin-dextromethorphan  5 mL Oral TID PC  . neomycin-polymyxin-hydrocortisone  3 drop Left EAR Q6H  . pantoprazole  40 mg Oral QHS  . polyethylene glycol  17 g Oral Daily  . pregabalin  200 mg Oral BID  . [START ON 08/29/2020] sertraline  50 mg Oral Daily   Continuous Infusions:   LOS: 11 days      Debbe Odea, MD [facility] Pager: [email or web address] 08/28/2020, 11:36 AM

## 2020-08-29 DIAGNOSIS — I639 Cerebral infarction, unspecified: Secondary | ICD-10-CM | POA: Diagnosis not present

## 2020-08-29 DIAGNOSIS — N2889 Other specified disorders of kidney and ureter: Secondary | ICD-10-CM | POA: Diagnosis not present

## 2020-08-29 MED ORDER — MENTHOL 3 MG MT LOZG: 1.0000 | LOZENGE | OROMUCOSAL | Status: DC | PRN

## 2020-08-29 NOTE — Progress Notes (Addendum)
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  She was found to have a left parietal subacute infarct.  Subjective: Continues to have a cough with sputum and is having trouble coughing up the sputum. No fever. Has a scratchy throat still.   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 - Unable to have an MRI due to spinal stimulator - - CT venogram > Enhancing leptomeningeal contrast over the area of abnormal density in the left parietal lobe, consistent with subacute infarct.  - 2 D ECHO> no thrombus -Neurology feels CVA may be embolic and recommends a loop recorder and aspirin and Plavix for 3 weeks followed by Plavix alone - LDL 88, goal less than 70- statin has been started - Plan is for her to transition to skilled nursing facility for rehab  - patient needs a Zio patch on day of discharge and EP will need to be called   Active Problems: Hypertension - Home blood pressure meds include HCTZ and Catapres - Currently on Amlodipine  Cough- starting 5/14 - checked COVID and negative- per RN, the was a COVID patient in a room near her -cont Tussionex and Mucinex- add flutter valve - lungs clear, no leukocytosis or fevers noted yet- follow  Heme positive stool- multiple prior surgeries - 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 -s/p 2021 venting gastrostomy tube (now removed) - status post laparoscopic distal gastrectomy 3/29 status post incisional hernia repair on 4/4 - GI was consulted while the  patient was at [facility] and preferred not to do a work-up in the setting of acute neurological issue - She is now on aspirin and Plavix which should not be discontinued -Hemoglobin has remained stable since admission -  Hemoccult repeated on 5/14 and found to be negative  Anterior abdominal wall wound from prior PEG tube - she presented to the hospital with this - Continue wound care recommendations- healing well and nearly closed - wound nearly healed but patient wants oxycodone for the pain- have d/c'd this in favor of Tylenol which is working well for her  Anxiety- husband passed away in 04/30/2020 - Resume Zoloft - increase to 50 mg as Wellbutrin weaned off- Wellbutrin resumed at 1/2 dose (was on 300 BID) to prevent withdrawal symptoms- as it can increase seizure risk, it should be stopped but will need to be weaned off- she is in agreement with this plan   Acute metabolic encephalopathy- ? dementia - noted while at [facility] - has resolved - I am unsure of the etiology- - per daughter, the patient has had memory issues which started in 2012 and sometimes asks the same questions twice- no formal neurological eval  - I have not noted any memory deficits & I find her to be a good historian  Obesity Body mass index is 35.51 kg/m. - s/p gastric bypass    Left renal mass -CT abd/pelvis > Small left renal cortical mass, posteromedial midpole, which is larger than prior exam. This could reflect a small renal  neoplasm. - Recommend CT abdomen with and without contrast in 3 to 6 months to assess  -patient is unable to have an MRI due to spinal stimulator  H/o DVT with PE   Time spent in minutes: 35-   DVT prophylaxis: SCDs Start: 08/18/20 0050  Code Status: Full code Family Communication: daughter Sheralyn Boatman Level of Care: Level of care: Med-Surg Disposition Plan:  Status is: Inpatient  Remains inpatient appropriate because:Unsafe d/c plan   Dispo: The  patient is from: Home              Anticipated d/c is to: SNF              Patient currently is medically stable to d/c.   Difficult to place patient Yes      Consultants:   neurology Procedures:   none Antimicrobials:  Anti-infectives (From admission, onward)   Start     Dose/Rate Route Frequency Ordered Stop   08/17/20 2130  cefTRIAXone (ROCEPHIN) 1 g in sodium chloride 0.9 % 100 mL IVPB  Status:  Discontinued        1 g 200 mL/hr over 30 Minutes Intravenous Every 24 hours 08/17/20 2115 08/19/20 0955       Objective: Vitals:   08/29/20 0335 08/29/20 0819 08/29/20 1113 08/29/20 1501  BP: 135/66 (!) 151/76 133/75 (!) 154/80  Pulse: (!) 50 60 68 70  Resp: 17 18 18 20   Temp: (!) 97.5 F (36.4 C) 98.4 F (36.9 C) 97.8 F (36.6 C) 98.6 F (37 C)  TempSrc: Oral Oral Oral Oral  SpO2: 96% 97% 98% 98%  Weight:      Height:        Intake/Output Summary (Last 24 hours) at 08/29/2020 1550 Last data filed at 08/28/2020 2319 Gross per 24 hour  Intake 480 ml  Output --  Net 480 ml   Filed Weights   08/17/20 1352 08/19/20 0328  Weight: 97.5 kg 99.8 kg    Examination: General exam: Appears comfortable  HEENT: PERRLA, oral mucosa moist, no sclera icterus or thrush Respiratory system: Clear to auscultation. Respiratory effort normal. Cardiovascular system: S1 & S2 heard, regular rate and rhythm Gastrointestinal system: Abdomen soft, non-tender, nondistended. Normal bowel sounds   Central nervous system: exam unchanged Extremities: No cyanosis, clubbing or edema Skin: No rashes or ulcers Psychiatry:  Mood & affect appropriate.     Data Reviewed: I have personally reviewed following labs and imaging studies  CBC: Recent Labs  Lab 08/24/20 0307 08/28/20 0823  WBC 3.5* 5.1  HGB 10.4* 11.8*  HCT 31.7* 36.2  MCV 88.8 90.3  PLT 164 440   Basic Metabolic Panel: Recent Labs  Lab 08/24/20 0307  NA 137  K 3.4*  CL 105  CO2 24  GLUCOSE 116*  BUN 8  CREATININE  0.89  CALCIUM 8.8*   GFR: Estimated Creatinine Clearance: 71.1 mL/min (by C-G formula based on SCr of 0.89 mg/dL). Liver Function Tests: Recent Labs  Lab 08/24/20 0307  AST 19  ALT 15  ALKPHOS 51  BILITOT 1.1  PROT 5.6*  ALBUMIN 2.8*   No results for input(s): LIPASE, AMYLASE in the last 168 hours. No results for input(s): AMMONIA in the last 168 hours. Coagulation Profile: No results for input(s): INR, PROTIME in the last 168 hours. Cardiac Enzymes: No results for input(s): CKTOTAL, CKMB, CKMBINDEX, TROPONINI in the last 168 hours. BNP (last 3 results) No results for input(s): PROBNP in the last 8760 hours. HbA1C: No results for  input(s): HGBA1C in the last 72 hours. CBG: Recent Labs  Lab 08/25/20 1210 08/26/20 0023 08/26/20 1239 08/26/20 2351 08/27/20 0624  GLUCAP 97 94 138* 115* 89   Lipid Profile: No results for input(s): CHOL, HDL, LDLCALC, TRIG, CHOLHDL, LDLDIRECT in the last 72 hours. Thyroid Function Tests: No results for input(s): TSH, T4TOTAL, FREET4, T3FREE, THYROIDAB in the last 72 hours. Anemia Panel: No results for input(s): VITAMINB12, FOLATE, FERRITIN, TIBC, IRON, RETICCTPCT in the last 72 hours. Urine analysis:    Component Value Date/Time   COLORURINE YELLOW 08/17/2020 2100   APPEARANCEUR CLEAR 08/17/2020 2100   LABSPEC 1.042 (H) 08/17/2020 2100   PHURINE 6.0 08/17/2020 2100   GLUCOSEU NEGATIVE 08/17/2020 2100   HGBUR SMALL (A) 08/17/2020 2100   [place] NEGATIVE 08/17/2020 2100   [place] NEGATIVE 08/17/2020 2100   PROTEINUR NEGATIVE 08/17/2020 2100   NITRITE NEGATIVE 08/17/2020 2100   LEUKOCYTESUR NEGATIVE 08/17/2020 2100   Sepsis Labs: @LABRCNTIP (procalcitonin:4,lacticidven:4) ) Recent Results (from the past 240 hour(s))  Resp Panel by RT-PCR (Flu A&B, Covid) Nasopharyngeal Swab     Status: None   Collection Time: 08/28/20 10:16 AM   Specimen: Nasopharyngeal Swab; Nasopharyngeal(NP) swabs in vial transport medium  Result Value  Ref Range Status   SARS Coronavirus 2 by RT PCR NEGATIVE NEGATIVE Final    Comment: (NOTE) SARS-CoV-2 target nucleic acids are NOT DETECTED.  The SARS-CoV-2 RNA is generally detectable in upper respiratory specimens during the acute phase of infection. The lowest concentration of SARS-CoV-2 viral copies this assay can detect is 138 copies/mL. A negative result does not preclude SARS-Cov-2 infection and should not be used as the sole basis for treatment or other patient management decisions. A negative result may occur with  improper specimen collection/handling, submission of specimen other than nasopharyngeal swab, presence of viral mutation(s) within the areas targeted by this assay, and inadequate number of viral copies(<138 copies/mL). A negative result must be combined with clinical observations, patient history, and epidemiological information. The expected result is Negative.  Fact Sheet for Patients:  EntrepreneurPulse.com.au  Fact Sheet for Healthcare Providers:  IncredibleEmployment.be  This test is no t yet approved or cleared by the [place] FDA and  has been authorized for detection and/or diagnosis of SARS-CoV-2 by FDA under an Emergency Use Authorization (EUA). This EUA will remain  in effect (meaning this test can be used) for the duration of the COVID-19 declaration under Section 564(b)(1) of the Act, 21 U.S.C.section 360bbb-3(b)(1), unless the authorization is terminated  or revoked sooner.       Influenza A by PCR NEGATIVE NEGATIVE Final   Influenza B by PCR NEGATIVE NEGATIVE Final    Comment: (NOTE) The Xpert Xpress SARS-CoV-2/FLU/RSV plus assay is intended as an aid in the diagnosis of influenza from Nasopharyngeal swab specimens and should not be used as a sole basis for treatment. Nasal washings and aspirates are unacceptable for Xpert Xpress SARS-CoV-2/FLU/RSV testing.  Fact Sheet for  Patients: EntrepreneurPulse.com.au  Fact Sheet for Healthcare Providers: IncredibleEmployment.be  This test is not yet approved or cleared by the [place] FDA and has been authorized for detection and/or diagnosis of SARS-CoV-2 by FDA under an Emergency Use Authorization (EUA). This EUA will remain in effect (meaning this test can be used) for the duration of the COVID-19 declaration under Section 564(b)(1) of the Act, 21 U.S.C. section 360bbb-3(b)(1), unless the authorization is terminated or revoked.  Performed at [facility], [place] [address] [place], [place] [address]  Radiology Studies: No results found.    Scheduled Meds: . amLODipine  5 mg Oral Daily  . aspirin  81 mg Oral Daily  . atorvastatin  40 mg Oral Daily  . bisacodyl  10 mg Rectal Once  . buPROPion  300 mg Oral Daily  . carbamide peroxide  5 drop Both EARS BID  . chlorpheniramine-HYDROcodone  5 mL Oral QHS  . clopidogrel  75 mg Oral Q breakfast  . guaiFENesin  600 mg Oral BID  . neomycin-polymyxin-hydrocortisone  3 drop Left EAR Q6H  . pantoprazole  40 mg Oral QHS  . polyethylene glycol  17 g Oral Daily  . pregabalin  200 mg Oral BID  . sertraline  50 mg Oral Daily   Continuous Infusions:   LOS: 12 days      Debbe Odea, MD [facility] Pager: [email or web address] 08/29/2020, 3:50 PM

## 2020-08-29 NOTE — Progress Notes (Signed)
Physical Therapy Treatment Patient Details Name: Morgan Dixon MRN: [id number] DOB: 1950-12-04 Today's Date: 08/29/2020    History of Present Illness Pt is 70 yo female who presents to [facility] with AMS and dysphasia with CT showing L parietal hypodensity concerning for CVA vs mass as well as L renal mass. Pt also with abdominal wound and possible GI bleed. PMH: gastric bypass surgery, HTN, chronic back pain with spinal cord stimulator.    PT Comments    Pt was seen for mobility in her room, distracted by tearful discussion about her husband's passing in [name] 2022.  Pt is able to walk but requires close supervision due to her tendency to miss obstacles and limited observation of setting up sequence to sit.  Pt is also quite unfocused with her grieving process.  Pt will be still recommended to go to SNF with follow up by family at home.  HHPT will see at that level.  Pt is struggling with word finding but is discussing wanting to be closer to her home with rehab.  States she is in [place].     Follow Up Recommendations  SNF     Equipment Recommendations  None recommended by PT    Recommendations for Other Services       Precautions / Restrictions Precautions Precautions: Fall Restrictions Weight Bearing Restrictions: No    Mobility  Bed Mobility Overal bed mobility: Needs Assistance Bed Mobility: Supine to Sit Rolling: Supervision   Supine to sit: Supervision     General bed mobility comments: HOB up and pt used bed rail    Transfers Overall transfer level: Needs assistance Equipment used: Rolling walker (2 wheeled) Transfers: Sit to/from Stand Sit to Stand: Min guard         General transfer comment: for safety  Ambulation/Gait Ambulation/Gait assistance: Min guard Gait Distance (Feet): 100 Feet (40+60) Assistive device: Rolling walker (2 wheeled);1 person hand held assist Gait Pattern/deviations: Step-to pattern;Step-through pattern;Decreased stride  length;Trunk flexed Gait velocity: decreased   General Gait Details: pt requires recuing during task as she forgets the safety rules and gets distracted with conversation she initiated about her husband's loss   Stairs             Wheelchair Mobility    Modified Rankin (Stroke Patients Only)       Balance Overall balance assessment: Needs assistance   Sitting balance-Leahy Scale: Fair     Standing balance support: Bilateral upper extremity supported;During functional activity Standing balance-Leahy Scale: Fair                              Cognition Arousal/Alertness: Awake/alert Behavior During Therapy: Anxious Overall Cognitive Status: No family/caregiver present to determine baseline cognitive functioning                                 General Comments: word finding was better when pt took her time, but is struggling with sequencing at times and needs tactile and verbal cues      Exercises      General Comments        Pertinent Vitals/Pain Pain Assessment: No/denies pain    Home Living                      Prior Function            PT Goals (current goals  can now be found in the care plan section) Acute Rehab PT Goals Patient Stated Goal: to get better Progress towards PT goals: Progressing toward goals    Frequency    Min 3X/week      PT Plan Current plan remains appropriate    Co-evaluation              AM-PAC PT "6 Clicks" Mobility   Outcome Measure  Help needed turning from your back to your side while in a flat bed without using bedrails?: A Little Help needed moving from lying on your back to sitting on the side of a flat bed without using bedrails?: A Little Help needed moving to and from a bed to a chair (including a wheelchair)?: A Little Help needed standing up from a chair using your arms (e.g., wheelchair or bedside chair)?: A Little Help needed to walk in hospital room?: A  Little Help needed climbing 3-5 steps with a railing? : A Lot 6 Click Score: 17    End of Session Equipment Utilized During Treatment: Gait belt Activity Tolerance: Patient tolerated treatment well Patient left: in chair;with call bell/phone within reach;with chair alarm set Nurse Communication: Mobility status PT Visit Diagnosis: Unsteadiness on feet (R26.81);Other abnormalities of gait and mobility (R26.89);Muscle weakness (generalized) (M62.81)     Time: 9811-9147 PT Time Calculation (min) (ACUTE ONLY): 32 min  Charges:  $Gait Training: 8-22 mins $Therapeutic Activity: 8-22 mins                   Ramond Dial 08/29/2020, 12:48 PM  Mee Hives, PT MS Acute Rehab Dept. Number: [place] and [place]

## 2020-08-29 NOTE — TOC Progression Note (Addendum)
Transition of Care [facility]) - Progression Note    Patient Details  Name: Morgan Dixon MRN: [id number] Date of Birth: [birth year]  Transition of Care [facility]) CM/SW [place], [place] Phone Number: 08/29/2020, 11:15 AM  Clinical Narrative:     CSW called pt's daughter to discuss SNF choice. Daughter is upset that pt called her saying that she is being told she is going home today. CSW explained that recommendation is SNF and that pt is medically stable for d/c. CSW explained that a SNF choice would have to be made today in order to start SNF auth. Daughter is requesting facility in [place] explained he is checking with [facility] in [place] and [facility] in [place] but that if a [place] area facility can not take pt then she would have to choose a [place] facility today. CSW provided Facility choices. CSW to follow up later with choice and facility would start auth. Pt daughter is agreeable to this plan and states she will decide between a [place] facility if another facility is not available. CSW contacted and left message with [facility] and [facility].   1330: CSW spoke with [facility]; they can offer bed. CSW called pt's daughter and she confirms choice of [facility]. [name] daughter will arrange a family member or friend to transport pt at time of discharge. CSW confirmed choice with [facility]; [facility] will initiate insurance auth.    Expected Discharge Plan: [place] Barriers to Discharge: Continued Medical Work up,Insurance Authorization  Expected Discharge Plan and Services Expected Discharge Plan: [place] In-house Referral: Clinical Social Work   Post Acute Care Choice: [place] Living arrangements for the past 2 months: Single Family Home Expected Discharge Date: 08/27/20                                     Social Determinants of Health (SDOH)  Interventions    Readmission Risk Interventions Readmission Risk Prevention Plan 08/19/2020  Medication Screening Complete  Transportation Screening Complete  Some recent data might be hidden

## 2020-08-30 DIAGNOSIS — N2889 Other specified disorders of kidney and ureter: Secondary | ICD-10-CM | POA: Diagnosis not present

## 2020-08-30 DIAGNOSIS — I639 Cerebral infarction, unspecified: Secondary | ICD-10-CM | POA: Diagnosis not present

## 2020-08-30 MED ORDER — LOSARTAN POTASSIUM 25 MG PO TABS
25.0000 mg | ORAL_TABLET | Freq: Every day | ORAL | Status: DC
  Administered 2020-08-30 – 2020-09-05 (×7): 25 mg via ORAL
  Filled 2020-08-30 (×7): qty 1

## 2020-08-30 NOTE — Progress Notes (Signed)
PROGRESS NOTE    Morgan Dixon   ZOX:[id number]  DOB: 1951-03-06  DOA: 08/17/2020 PCP: Morgan Flavors, MD   Brief Narrative:  Morgan Dixon is a 70 year old female who is status post gastric bypass (07/19/18) and has hypertension and chronic back pain who presented to the hospital after community health nurse noted her to be confused.  She was found to have a left parietal subacute infarct.  Subjective: Sputum is a little more yellow today. No fevers.   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 - Unable to have an MRI due to spinal stimulator - - CT venogram > Enhancing leptomeningeal contrast over the area of abnormal density in the left parietal lobe, consistent with subacute infarct.  - 2 D ECHO> no thrombus -Neurology feels CVA may be embolic and recommends a loop recorder, aspirin and Plavix for 3 weeks followed by Plavix alone - LDL 88, goal less than 70- statin has been started - Plan is for her to transition to skilled nursing facility - right hand/arm weak - also need SLP for expressive aphasia  - EP plans to do loop as outpatient- for now, patient needs a Zio patch on day of discharge and EP will need to be called - of note, patient states she was asked to wear a holter monitor as outpatient (which she did) and it is still sitting in her car and not yet returned to the office   Active Problems: Hypertension - Home blood pressure meds include HCTZ and Catapres - Currently on Amlodipine - add Losartan today  Cough- starting 5/14 - per RN, the was a COVID patient in a room near her- checked COVID and it was negative -cont Tussionex, Mucinex,  flutter valve -sputum yellow now- lungs still clear, no leukocytosis or fevers noted yet- follow  Heme positive stool- multiple  prior surgeries - 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 -s/p 2021 venting gastrostomy tube (now removed) - status post laparoscopic distal gastrectomy 3/29 status post incisional hernia repair on 4/4 - GI was consulted while the patient was at [facility] and preferred not to do a work-up in the setting of acute neurological issue - She is now on aspirin and Plavix which should not be discontinued -Hemoglobin has remained stable since admission -  Hemoccult repeated on 5/14 and found to be negative  Anterior abdominal wall wound from prior PEG tube - she presented to the hospital with this - Continue wound care recommendations- healing well and nearly closed - wound nearly healed but patient wants oxycodone for the pain- have d/c'd this in favor of Tylenol which is working well for her  Anxiety- husband passed away in May 02, 2020 - Resume Zoloft - increase to 50 mg as Wellbutrin weaned off- Wellbutrin resumed at 1/2 dose (was on 300 BID) to prevent withdrawal symptoms- as it can increase seizure risk, it should be stopped but will need to be weaned off- she is in agreement with this plan  Acute metabolic encephalopathy- ? dementia - noted while at [facility] - has resolved - I am unsure of the etiology- - per daughter, the patient has had memory issues which started in 2012 and sometimes asks the same questions twice- no formal neurological eval  - I have not noted any memory deficits &  I find her to be a good historian  Obesity Body mass index is 35.51 kg/m. - s/p gastric bypass    Left renal mass -CT abd/pelvis > Small left renal cortical mass, posteromedial midpole, which is larger than prior exam. This could reflect a small renal neoplasm. - Recommend CT abdomen with and without contrast in 3 to 6 months to assess  -patient is unable to have an MRI due to spinal stimulator  H/o DVT with PE     Time spent in minutes: 35-   DVT prophylaxis:  SCDs Start: 08/18/20 0050 Code Status: Full code Family Communication: daughter Morgan Dixon Level of Care: Level of care: Med-Surg Disposition Plan:  Status is: Inpatient  Remains inpatient appropriate because:Unsafe d/c plan- OT recommends SNF   Dispo: The patient is from: Home              Anticipated d/c is to: SNF              Patient currently is medically stable to d/c.   Difficult to place patient Yes      Consultants:   neurology Procedures:   none Antimicrobials:  Anti-infectives (From admission, onward)   Start     Dose/Rate Route Frequency Ordered Stop   08/17/20 2130  cefTRIAXone (ROCEPHIN) 1 g in sodium chloride 0.9 % 100 mL IVPB  Status:  Discontinued        1 g 200 mL/hr over 30 Minutes Intravenous Every 24 hours 08/17/20 2115 08/19/20 0955       Objective: Vitals:   08/29/20 1931 08/29/20 2346 08/30/20 0404 08/30/20 0904  BP: 133/62 (!) 142/62 (!) 96/55 138/71  Pulse: (!) 58 63 (!) 55 62  Resp: 18 18 18 18   Temp: 98.5 F (36.9 C) 98.2 F (36.8 C) 98.9 F (37.2 C) 98.7 F (37.1 C)  TempSrc: Oral Oral Oral Oral  SpO2: 96% 95% 97% 95%  Weight:      Height:        Intake/Output Summary (Last 24 hours) at 08/30/2020 1243 Last data filed at 08/29/2020 2300 Gross per 24 hour  Intake 150 ml  Output --  Net 150 ml   Filed Weights   08/17/20 1352 08/19/20 0328  Weight: 97.5 kg 99.8 kg    Examination: General exam: Appears comfortable  HEENT: PERRLA, oral mucosa moist, no sclera icterus or thrush Respiratory system: Clear to auscultation. Respiratory effort normal. Cardiovascular system: S1 & S2 heard, regular rate and rhythm Gastrointestinal system: Abdomen soft, non-tender, nondistended. Normal bowel sounds   Central nervous system: Alert and oriented.expressive aphasia, right hand and forearm 4/5 strength Extremities: No cyanosis, clubbing or edema Skin: No rashes or ulcers Psychiatry:  Mood & affect appropriate.     Data Reviewed:  I have personally reviewed following labs and imaging studies  CBC: Recent Labs  Lab 08/24/20 0307 08/28/20 0823  WBC 3.5* 5.1  HGB 10.4* 11.8*  HCT 31.7* 36.2  MCV 88.8 90.3  PLT 164 [id number]   Basic Metabolic Panel: Recent Labs  Lab 08/24/20 0307  NA 137  K 3.4*  CL 105  CO2 24  GLUCOSE 116*  BUN 8  CREATININE 0.89  CALCIUM 8.8*   GFR: Estimated Creatinine Clearance: 71.1 mL/min (by C-G formula based on SCr of 0.89 mg/dL). Liver Function Tests: Recent Labs  Lab 08/24/20 0307  AST 19  ALT 15  ALKPHOS 51  BILITOT 1.1  PROT 5.6*  ALBUMIN 2.8*   No results for input(s): LIPASE, AMYLASE in  the last 168 hours. No results for input(s): AMMONIA in the last 168 hours. Coagulation Profile: No results for input(s): INR, PROTIME in the last 168 hours. Cardiac Enzymes: No results for input(s): CKTOTAL, CKMB, CKMBINDEX, TROPONINI in the last 168 hours. BNP (last 3 results) No results for input(s): PROBNP in the last 8760 hours. HbA1C: No results for input(s): HGBA1C in the last 72 hours. CBG: Recent Labs  Lab 08/25/20 1210 08/26/20 0023 08/26/20 1239 08/26/20 2351 08/27/20 0624  GLUCAP 97 94 138* 115* 89   Lipid Profile: No results for input(s): CHOL, HDL, LDLCALC, TRIG, CHOLHDL, LDLDIRECT in the last 72 hours. Thyroid Function Tests: No results for input(s): TSH, T4TOTAL, FREET4, T3FREE, THYROIDAB in the last 72 hours. Anemia Panel: No results for input(s): VITAMINB12, FOLATE, FERRITIN, TIBC, IRON, RETICCTPCT in the last 72 hours. Urine analysis:    Component Value Date/Time   COLORURINE YELLOW 08/17/2020 2100   APPEARANCEUR CLEAR 08/17/2020 2100   LABSPEC 1.042 (H) 08/17/2020 2100   PHURINE 6.0 08/17/2020 2100   GLUCOSEU NEGATIVE 08/17/2020 2100   HGBUR SMALL (A) 08/17/2020 2100   [place] NEGATIVE 08/17/2020 2100   [place] NEGATIVE 08/17/2020 2100   PROTEINUR NEGATIVE 08/17/2020 2100   NITRITE NEGATIVE 08/17/2020 2100   LEUKOCYTESUR NEGATIVE  08/17/2020 2100   Sepsis Labs: @LABRCNTIP (procalcitonin:4,lacticidven:4) ) Recent Results (from the past 240 hour(s))  Resp Panel by RT-PCR (Flu A&B, Covid) Nasopharyngeal Swab     Status: None   Collection Time: 08/28/20 10:16 AM   Specimen: Nasopharyngeal Swab; Nasopharyngeal(NP) swabs in vial transport medium  Result Value Ref Range Status   SARS Coronavirus 2 by RT PCR NEGATIVE NEGATIVE Final    Comment: (NOTE) SARS-CoV-2 target nucleic acids are NOT DETECTED.  The SARS-CoV-2 RNA is generally detectable in upper respiratory specimens during the acute phase of infection. The lowest concentration of SARS-CoV-2 viral copies this assay can detect is 138 copies/mL. A negative result does not preclude SARS-Cov-2 infection and should not be used as the sole basis for treatment or other patient management decisions. A negative result may occur with  improper specimen collection/handling, submission of specimen other than nasopharyngeal swab, presence of viral mutation(s) within the areas targeted by this assay, and inadequate number of viral copies(<138 copies/mL). A negative result must be combined with clinical observations, patient history, and epidemiological information. The expected result is Negative.  Fact Sheet for Patients:  EntrepreneurPulse.com.au  Fact Sheet for Healthcare Providers:  IncredibleEmployment.be  This test is no t yet approved or cleared by the [place] FDA and  has been authorized for detection and/or diagnosis of SARS-CoV-2 by FDA under an Emergency Use Authorization (EUA). This EUA will remain  in effect (meaning this test can be used) for the duration of the COVID-19 declaration under Section 564(b)(1) of the Act, 21 U.S.C.section 360bbb-3(b)(1), unless the authorization is terminated  or revoked sooner.       Influenza A by PCR NEGATIVE NEGATIVE Final   Influenza B by PCR NEGATIVE NEGATIVE Final     Comment: (NOTE) The Xpert Xpress SARS-CoV-2/FLU/RSV plus assay is intended as an aid in the diagnosis of influenza from Nasopharyngeal swab specimens and should not be used as a sole basis for treatment. Nasal washings and aspirates are unacceptable for Xpert Xpress SARS-CoV-2/FLU/RSV testing.  Fact Sheet for Patients: EntrepreneurPulse.com.au  Fact Sheet for Healthcare Providers: IncredibleEmployment.be  This test is not yet approved or cleared by the [place] FDA and has been authorized for detection and/or diagnosis of SARS-CoV-2 by FDA  under an Emergency Use Authorization (EUA). This EUA will remain in effect (meaning this test can be used) for the duration of the COVID-19 declaration under Section 564(b)(1) of the Act, 21 U.S.C. section 360bbb-3(b)(1), unless the authorization is terminated or revoked.  Performed at [facility], [place] [address] [place], [place] [address]          Radiology Studies: No results found.    Scheduled Meds: . amLODipine  5 mg Oral Daily  . aspirin  81 mg Oral Daily  . atorvastatin  40 mg Oral Daily  . bisacodyl  10 mg Rectal Once  . buPROPion  300 mg Oral Daily  . carbamide peroxide  5 drop Both EARS BID  . chlorpheniramine-HYDROcodone  5 mL Oral QHS  . clopidogrel  75 mg Oral Q breakfast  . guaiFENesin  600 mg Oral BID  . losartan  25 mg Oral Daily  . neomycin-polymyxin-hydrocortisone  3 drop Left EAR Q6H  . pantoprazole  40 mg Oral QHS  . polyethylene glycol  17 g Oral Daily  . pregabalin  200 mg Oral BID  . sertraline  50 mg Oral Daily   Continuous Infusions:   LOS: 13 days      Debbe Odea, MD [facility] Pager: [email or web address] 08/30/2020, 12:43 PM

## 2020-08-30 NOTE — Progress Notes (Signed)
Physical Therapy Treatment Patient Details Name: Morgan Dixon MRN: [id number] DOB: 1950-12-08 Today's Date: 08/30/2020    History of Present Illness Pt is 70 yo female who presents to [facility] with AMS and dysphasia with CT showing L parietal hypodensity concerning for CVA vs mass as well as L renal mass. Pt also with abdominal wound and possible GI bleed. PMH: gastric bypass surgery, HTN, chronic back pain with spinal cord stimulator.    PT Comments    Pt received sitting EOB. She required min guard assist transfers and ambulation 150' with RW. Fatigue noted at end of gait trial. Cues needed to maneuver around obstacles, maintain direction, and to stay on task. Primary issues continue to be STM deficits and expressive language/word finding difficulties. All areas continue to show improvement. Pt is very motivated to return to independent level. Pt in recliner with feet elevated at end of session. Recommend chair alarm when in recliner with feet elevated. OK for no bed alarm for bathroom privileges when in bed. Current POC remains appropriate.    Follow Up Recommendations  SNF     Equipment Recommendations  None recommended by PT    Recommendations for Other Services       Precautions / Restrictions Precautions Precautions: Fall Restrictions Weight Bearing Restrictions: No    Mobility  Bed Mobility Overal bed mobility: Needs Assistance Bed Mobility: Supine to Sit;Sit to Supine Rolling: Modified independent (Device/Increase time)   Supine to sit: Supervision Sit to supine: Supervision   General bed mobility comments: supervision for safety, +rail    Transfers Overall transfer level: Needs assistance Equipment used: Rolling walker (2 wheeled) Transfers: Sit to/from [facility] Sit to Stand: Min guard Stand pivot transfers: Min guard       General transfer comment: min guard for safety  Ambulation/Gait Ambulation/Gait assistance: Min guard Gait  Distance (Feet): 150 Feet Assistive device: Rolling walker (2 wheeled) Gait Pattern/deviations: Step-through pattern;Decreased stride length Gait velocity: decreased Gait velocity interpretation: 1.31 - 2.62 ft/sec, indicative of limited community ambulator General Gait Details: directional cues and cues for maneuvering around obstacles. Fatigue noted after 150'. Easily distracted.   Stairs             Wheelchair Mobility    Modified Rankin (Stroke Patients Only) Modified Rankin (Stroke Patients Only) Pre-Morbid Rankin Score: No symptoms Modified Rankin: Moderately severe disability     Balance Overall balance assessment: Needs assistance Sitting-balance support: No upper extremity supported;Feet supported Sitting balance-Leahy Scale: Good     Standing balance support: Bilateral upper extremity supported;During functional activity;No upper extremity supported Standing balance-Leahy Scale: Fair Standing balance comment: static stand without UE support, RW for ambulation                            Cognition Arousal/Alertness: Awake/alert Behavior During Therapy: WFL for tasks assessed/performed (mildly anxious) Overall Cognitive Status: Impaired/Different from baseline Area of Impairment: Memory;Safety/judgement                     Memory: Decreased short-term memory   Safety/Judgement: Decreased awareness of safety     General Comments: Continues to demonstrate expressive difficulties, word finding. Improves when pt slows down and focuses on single task.      Exercises      General Comments General comments (skin integrity, edema, etc.): VSS on RA      Pertinent Vitals/Pain Pain Assessment: No/denies pain    Home Living  Prior Function            PT Goals (current goals can now be found in the care plan section) Acute Rehab PT Goals Patient Stated Goal: to get better Progress towards PT goals:  Progressing toward goals    Frequency    Min 3X/week      PT Plan Current plan remains appropriate    Co-evaluation              AM-PAC PT "6 Clicks" Mobility   Outcome Measure  Help needed turning from your back to your side while in a flat bed without using bedrails?: None Help needed moving from lying on your back to sitting on the side of a flat bed without using bedrails?: A Little Help needed moving to and from a bed to a chair (including a wheelchair)?: A Little Help needed standing up from a chair using your arms (e.g., wheelchair or bedside chair)?: A Little Help needed to walk in hospital room?: A Little Help needed climbing 3-5 steps with a railing? : A Lot 6 Click Score: 18    End of Session Equipment Utilized During Treatment: Gait belt Activity Tolerance: Patient tolerated treatment well Patient left: in chair;with chair alarm set;with call bell/phone within reach Nurse Communication: Mobility status PT Visit Diagnosis: Unsteadiness on feet (R26.81);Other abnormalities of gait and mobility (R26.89);Muscle weakness (generalized) (M62.81)     Time: 9024-0973 PT Time Calculation (min) (ACUTE ONLY): 16 min  Charges:  $Gait Training: 8-22 mins                     Lorrin Goodell, PT  Office # [phone] Pager [place], [place] 08/30/2020, 11:13 AM

## 2020-08-31 ENCOUNTER — Inpatient Hospital Stay (HOSPITAL_COMMUNITY): Payer: Medicare HMO

## 2020-08-31 DIAGNOSIS — G9341 Metabolic encephalopathy: Secondary | ICD-10-CM

## 2020-08-31 DIAGNOSIS — R4182 Altered mental status, unspecified: Secondary | ICD-10-CM | POA: Diagnosis not present

## 2020-08-31 DIAGNOSIS — R7401 Elevation of levels of liver transaminase levels: Secondary | ICD-10-CM | POA: Diagnosis not present

## 2020-08-31 DIAGNOSIS — I639 Cerebral infarction, unspecified: Secondary | ICD-10-CM | POA: Diagnosis not present

## 2020-08-31 LAB — CBC
HCT: 33.1 % — ABNORMAL LOW (ref 36.0–46.0)
Hemoglobin: 10.7 g/dL — ABNORMAL LOW (ref 12.0–15.0)
MCH: 29.6 pg (ref 26.0–34.0)
MCHC: 32.3 g/dL (ref 30.0–36.0)
MCV: 91.7 fL (ref 80.0–100.0)
Platelets: 227 10*3/uL (ref 150–400)
RBC: 3.61 MIL/uL — ABNORMAL LOW (ref 3.87–5.11)
RDW: 16.8 % — ABNORMAL HIGH (ref 11.5–15.5)
WBC: 4.9 10*3/uL (ref 4.0–10.5)
nRBC: 0 % (ref 0.0–0.2)

## 2020-08-31 MED ORDER — OXYMETAZOLINE HCL 0.05 % NA SOLN
1.0000 | Freq: Two times a day (BID) | NASAL | Status: AC
  Administered 2020-08-31 – 2020-09-02 (×6): 1 via NASAL
  Filled 2020-08-31: qty 30

## 2020-08-31 MED ORDER — ACETAMINOPHEN 500 MG PO TABS
1000.0000 mg | ORAL_TABLET | Freq: Four times a day (QID) | ORAL | Status: DC | PRN
  Administered 2020-08-31 – 2020-09-04 (×13): 1000 mg via ORAL
  Filled 2020-08-31 (×14): qty 2

## 2020-08-31 MED ORDER — KETOROLAC TROMETHAMINE 15 MG/ML IJ SOLN
15.0000 mg | Freq: Once | INTRAMUSCULAR | Status: AC
  Administered 2020-08-31: 15 mg via INTRAVENOUS
  Filled 2020-08-31: qty 1

## 2020-08-31 NOTE — Plan of Care (Signed)
  Problem: Education: Goal: Knowledge of General Education information will improve Description: Including pain rating scale, medication(s)/side effects and non-pharmacologic comfort measures Outcome: Progressing   Problem: Health Behavior/Discharge Planning: Goal: Ability to manage health-related needs will improve Outcome: Progressing   Problem: Clinical Measurements: Goal: Ability to maintain clinical measurements within normal limits will improve Outcome: Progressing Goal: Will remain free from infection Outcome: Progressing Goal: Diagnostic test results will improve Outcome: Progressing Goal: Respiratory complications will improve Outcome: Progressing Goal: Cardiovascular complication will be avoided Outcome: Progressing   Problem: Activity: Goal: Risk for activity intolerance will decrease Outcome: Progressing   Problem: Nutrition: Goal: Adequate nutrition will be maintained Outcome: Progressing   Problem: Activity: Goal: Risk for activity intolerance will decrease Outcome: Progressing   Problem: Pain Managment: Goal: General experience of comfort will improve Outcome: Progressing   Problem: Safety: Goal: Ability to remain free from injury will improve Outcome: Progressing   Problem: Skin Integrity: Goal: Risk for impaired skin integrity will decrease Outcome: Progressing

## 2020-08-31 NOTE — Progress Notes (Signed)
PROGRESS NOTE    Morgan Dixon   QBH:[id number]  DOB: January 21, 1951  DOA: 08/17/2020 PCP: Alfonse Flavors, MD   Brief Narrative:   Morgan Dixon is a 70 year old female with past medical history of gastric bypass on 07/19/2018 hypertension and chronic back pain presented to the hospital after episode of confusion.  Patient was noted to have left parietal subacute infarct and was admitted to hospital for further evaluation and treatment.   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  MRI was unable to be obtained due to spinal stimulator.  CT venogram showed some leptomeningeal contrast over the area of the abnormal density consistent with subacute infarct.  2D echocardiogram did not show any evidence of thrombus.  Neurology was consulted in recommended aspirin and Plavix for 3 weeks followed by Plavix alone.  Patient will need a loop recorder as well.  Patient has been started statins as well.  Will need continued speech therapy for expressive aphasia.  Electrophysiology plans to do loop as outpatient but will need Zio patch on day of discharge.  Will need to call electrophysiology prior to discharge.  Hypertension Currently on amlodipine and losartan.  Patient was on Catapres and HCTZ at home.  Blood pressure seems to be stable at this time.  Cough with productive sputum. Improved.  Continue supportive care including Mucinex, flutter valve.  No fever or leukocytosis.  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 -s/p 2021 venting gastrostomy tube (now removed) - status post laparoscopic distal gastrectomy  3/29 status post incisional hernia repair on 4/4.  GI was consulted while the patient was at [facility] and prefer not to do work-up in the acute neurological state.  On aspirin and Plavix which will need to be continued.  Hemoglobin has however remained stable since admission.  Occult repeated on 5/14 was negative.  Latest hemoglobin of 10.7 at this time.  Anterior abdominal wall wound from prior PEG tube In the process of healing.  Tylenol as needed for pain.  Anxiety-  Patient stated that her husband had passed away recently on 04-29-2020.  Zoloft has been resumed and increased to 50 mg as Wellbutrin has been resumed half dose.  We will gradually need to wean Wellbutrin since it has higher risk of seizures.   Acute metabolic encephalopathy-  Improved at this time.  As per the family patient has had some memory issues starting 2012.  Obesity Body mass index is 35.51 kg/m. Status post gastric bypass.  Left renal mass CT abd/pelvis > Small left renal cortical mass, posteromedial midpole, which is larger than prior exam.  Possibility of a small renal neoplasm.  Recommended CT scan of the abdomen with and without contrast in 3 to 6 months since the patient is unable to take MRI due to spinal stimulator.    H/o DVT with PE  Currently on Coumadin as outpatient for pulmonary hypertension  Headache could be sinus related.  We will add Afrin and Tylenol for now.  DVT prophylaxis: SCDs Start: 08/18/20 0050   Code Status:  Full code  Family Communication:  None today  Level  of Care:  Med-Surg   Disposition Plan:  Status is: Inpatient  Remains inpatient appropriate because:Unsafe d/c plan- PT recommends SNF  Dispo: The patient is from: Home              Anticipated d/c is to: SNF              Patient currently is medically stable to d/c.  Pending insurance authorization   Difficult to place patient Yes    Consultants:  Neurology EP cardiology  Procedures:   None  Antimicrobials:  None  Anti-infectives (From admission, onward)   Start     Dose/Rate Route Frequency Ordered Stop   08/17/20 2130  cefTRIAXone (ROCEPHIN) 1 g in sodium chloride 0.9 % 100 mL IVPB  Status:  Discontinued        1 g 200 mL/hr over 30 Minutes Intravenous Every 24 hours 08/17/20 2115 08/19/20 0955     Subjective:  Today, patient was seen and examined at bedside.  Complains of moderate headache mostly on the right side.  Denies any dizziness, lightheadedness chest pain or shortness of breath.  Objective: Vitals:   08/30/20 2344 08/31/20 0346 08/31/20 0740 08/31/20 1112  BP: (!) 105/47 134/70 113/65 131/69  Pulse: (!) 50 (!) 50 (!) 50 78  Resp: 17 16 16 18   Temp: (!) 97.5 F (36.4 C) 98.3 F (36.8 C) 97.7 F (36.5 C) 97.8 F (36.6 C)  TempSrc: Oral Oral Oral Oral  SpO2: 99% 96% 99% 100%  Weight:      Height:        Intake/Output Summary (Last 24 hours) at 08/31/2020 1354 Last data filed at 08/31/2020 0900 Gross per 24 hour  Intake 680 ml  Output --  Net 680 ml   Filed Weights   08/17/20 1352 08/19/20 0328  Weight: 97.5 kg 99.8 kg    Physical examination:  General:  Average built, not in obvious distress HENT:   No scleral pallor or icterus noted. Oral mucosa is moist.  Left sinus tenderness on palpation. Chest:  Clear breath sounds.  Diminished breath sounds bilaterally. No crackles or wheezes.  CVS: S1 &S2 heard. No murmur.  Regular rate and rhythm. Abdomen: Soft, nontender, nondistended.  Bowel sounds are heard.   Extremities: No cyanosis, clubbing or edema.  Peripheral pulses are palpable. Psych: Alert, awake and oriented, normal mood, CNS: Expressive aphasia, right hand and forearm strength 4/5.   Skin: Warm and dry.  No rashes noted.   Data Reviewed: I have personally reviewed following labs and imaging studies  CBC: Recent Labs  Lab 08/28/20 0823 08/31/20 0330  WBC 5.1 4.9  HGB 11.8* 10.7*  HCT 36.2 33.1*  MCV 90.3 91.7  PLT  256 852   Basic Metabolic Panel: No results for input(s): NA, K, CL, CO2, GLUCOSE, BUN, CREATININE, CALCIUM, MG, PHOS in the last 168 hours. GFR: Estimated Creatinine Clearance: 71.1 mL/min (by C-G formula based on SCr of 0.89 mg/dL). Liver Function Tests: No results for input(s): AST, ALT, ALKPHOS, BILITOT, PROT, ALBUMIN in the last 168 hours. No results for input(s): LIPASE, AMYLASE in the last 168 hours. No results for input(s): AMMONIA in the last 168 hours. Coagulation Profile: No results for input(s): INR, PROTIME in the last 168 hours. Cardiac Enzymes: No results for input(s): CKTOTAL, CKMB, CKMBINDEX, TROPONINI in the last 168 hours. BNP (last 3 results) No results for input(s): PROBNP in the last 8760 hours. HbA1C: No results for input(s): HGBA1C in the last 72 hours. CBG: Recent  Labs  Lab 08/25/20 1210 08/26/20 0023 08/26/20 1239 08/26/20 2351 08/27/20 0624  GLUCAP 97 94 138* 115* 89   Lipid Profile: No results for input(s): CHOL, HDL, LDLCALC, TRIG, CHOLHDL, LDLDIRECT in the last 72 hours. Thyroid Function Tests: No results for input(s): TSH, T4TOTAL, FREET4, T3FREE, THYROIDAB in the last 72 hours. Anemia Panel: No results for input(s): VITAMINB12, FOLATE, FERRITIN, TIBC, IRON, RETICCTPCT in the last 72 hours. Urine analysis:    Component Value Date/Time   COLORURINE YELLOW 08/17/2020 2100   APPEARANCEUR CLEAR 08/17/2020 2100   LABSPEC 1.042 (H) 08/17/2020 2100   PHURINE 6.0 08/17/2020 2100   GLUCOSEU NEGATIVE 08/17/2020 2100   HGBUR SMALL (A) 08/17/2020 2100   [place] NEGATIVE 08/17/2020 2100   [place] NEGATIVE 08/17/2020 2100   PROTEINUR NEGATIVE 08/17/2020 2100   NITRITE NEGATIVE 08/17/2020 2100   LEUKOCYTESUR NEGATIVE 08/17/2020 2100   Sepsis Labs: @LABRCNTIP (procalcitonin:4,lacticidven:4) ) Recent Results (from the past 240 hour(s))  Resp Panel by RT-PCR (Flu A&B, Covid) Nasopharyngeal Swab     Status: None   Collection Time: 08/28/20 10:16  AM   Specimen: Nasopharyngeal Swab; Nasopharyngeal(NP) swabs in vial transport medium  Result Value Ref Range Status   SARS Coronavirus 2 by RT PCR NEGATIVE NEGATIVE Final    Comment: (NOTE) SARS-CoV-2 target nucleic acids are NOT DETECTED.  The SARS-CoV-2 RNA is generally detectable in upper respiratory specimens during the acute phase of infection. The lowest concentration of SARS-CoV-2 viral copies this assay can detect is 138 copies/mL. A negative result does not preclude SARS-Cov-2 infection and should not be used as the sole basis for treatment or other patient management decisions. A negative result may occur with  improper specimen collection/handling, submission of specimen other than nasopharyngeal swab, presence of viral mutation(s) within the areas targeted by this assay, and inadequate number of viral copies(<138 copies/mL). A negative result must be combined with clinical observations, patient history, and epidemiological information. The expected result is Negative.  Fact Sheet for Patients:  EntrepreneurPulse.com.au  Fact Sheet for Healthcare Providers:  IncredibleEmployment.be  This test is no t yet approved or cleared by the [place] FDA and  has been authorized for detection and/or diagnosis of SARS-CoV-2 by FDA under an Emergency Use Authorization (EUA). This EUA will remain  in effect (meaning this test can be used) for the duration of the COVID-19 declaration under Section 564(b)(1) of the Act, 21 U.S.C.section 360bbb-3(b)(1), unless the authorization is terminated  or revoked sooner.       Influenza A by PCR NEGATIVE NEGATIVE Final   Influenza B by PCR NEGATIVE NEGATIVE Final    Comment: (NOTE) The Xpert Xpress SARS-CoV-2/FLU/RSV plus assay is intended as an aid in the diagnosis of influenza from Nasopharyngeal swab specimens and should not be used as a sole basis for treatment. Nasal washings and aspirates are  unacceptable for Xpert Xpress SARS-CoV-2/FLU/RSV testing.  Fact Sheet for Patients: EntrepreneurPulse.com.au  Fact Sheet for Healthcare Providers: IncredibleEmployment.be  This test is not yet approved or cleared by the [place] FDA and has been authorized for detection and/or diagnosis of SARS-CoV-2 by FDA under an Emergency Use Authorization (EUA). This EUA will remain in effect (meaning this test can be used) for the duration of the COVID-19 declaration under Section 564(b)(1) of the Act, 21 U.S.C. section 360bbb-3(b)(1), unless the authorization is terminated or revoked.  Performed at [facility], [place] [address] [place], [place] [address]        Radiology Studies: No results found.  Scheduled Meds: . amLODipine  5 mg Oral Daily  . aspirin  81 mg Oral Daily  . atorvastatin  40 mg Oral Daily  . bisacodyl  10 mg Rectal Once  . buPROPion  300 mg Oral Daily  . carbamide peroxide  5 drop Both EARS BID  . chlorpheniramine-HYDROcodone  5 mL Oral QHS  . clopidogrel  75 mg Oral Q breakfast  . guaiFENesin  600 mg Oral BID  . losartan  25 mg Oral Daily  . neomycin-polymyxin-hydrocortisone  3 drop Left EAR Q6H  . pantoprazole  40 mg Oral QHS  . polyethylene glycol  17 g Oral Daily  . pregabalin  200 mg Oral BID  . sertraline  50 mg Oral Daily   Continuous Infusions:   LOS: 14 days    Flora Lipps, MD [facility] 08/31/2020, 1:54 PM

## 2020-08-31 NOTE — TOC Progression Note (Addendum)
Transition of Care [facility]) - Progression Note    Patient Details  Name: DEMETRICE AMSTUTZ MRN: [id number] Date of Birth: [birth year]  Transition of Care [facility]) CM/SW [place], [place] Phone Number: 08/31/2020, 12:09 PM  Clinical Narrative:     Spoke with [facility] liaison; snf Josem Kaufmann still pending.   Expected Discharge Plan: [place] Barriers to Discharge: Continued Medical Work up,Insurance Authorization  Expected Discharge Plan and Services Expected Discharge Plan: [place] In-house Referral: Clinical Social Work   Post Acute Care Choice: [place] Living arrangements for the past 2 months: Single Family Home Expected Discharge Date: 08/27/20                                     Social Determinants of Health (SDOH) Interventions    Readmission Risk Interventions Readmission Risk Prevention Plan 08/19/2020  Medication Screening Complete  Transportation Screening Complete  Some recent data might be hidden

## 2020-08-31 NOTE — Progress Notes (Signed)
Notified by Radiology that Head CT shows small intracranial hemorrhage.  Will repeat Head CT in 12 hours to make sure not expanding

## 2020-09-01 ENCOUNTER — Inpatient Hospital Stay (HOSPITAL_COMMUNITY): Payer: Medicare HMO

## 2020-09-01 DIAGNOSIS — N2889 Other specified disorders of kidney and ureter: Secondary | ICD-10-CM | POA: Diagnosis not present

## 2020-09-01 DIAGNOSIS — E876 Hypokalemia: Secondary | ICD-10-CM | POA: Diagnosis not present

## 2020-09-01 DIAGNOSIS — G9341 Metabolic encephalopathy: Secondary | ICD-10-CM | POA: Diagnosis not present

## 2020-09-01 DIAGNOSIS — R778 Other specified abnormalities of plasma proteins: Secondary | ICD-10-CM | POA: Diagnosis not present

## 2020-09-01 DIAGNOSIS — I639 Cerebral infarction, unspecified: Secondary | ICD-10-CM | POA: Diagnosis not present

## 2020-09-01 DIAGNOSIS — R7401 Elevation of levels of liver transaminase levels: Secondary | ICD-10-CM | POA: Diagnosis not present

## 2020-09-01 LAB — BASIC METABOLIC PANEL
Anion gap: 4 — ABNORMAL LOW (ref 5–15)
BUN: 27 mg/dL — ABNORMAL HIGH (ref 8–23)
CO2: 27 mmol/L (ref 22–32)
Calcium: 9.2 mg/dL (ref 8.9–10.3)
Chloride: 105 mmol/L (ref 98–111)
Creatinine, Ser: 1.08 mg/dL — ABNORMAL HIGH (ref 0.44–1.00)
GFR, Estimated: 56 mL/min — ABNORMAL LOW (ref >60)
Glucose, Bld: 116 mg/dL — ABNORMAL HIGH (ref 70–99)
Potassium: 4.2 mmol/L (ref 3.5–5.1)
Sodium: 136 mmol/L (ref 135–145)

## 2020-09-01 LAB — MAGNESIUM: Magnesium: 2.2 mg/dL (ref 1.7–2.4)

## 2020-09-01 MED ORDER — MELATONIN 3 MG PO TABS
3.0000 mg | ORAL_TABLET | Freq: Every day | ORAL | Status: DC
  Administered 2020-09-02 – 2020-09-04 (×4): 3 mg via ORAL
  Filled 2020-09-01 (×4): qty 1

## 2020-09-01 NOTE — Progress Notes (Signed)
Seen with Dr Quentin Ore.  In shared decision making with patient will plan 2 week Zio patch at discharge with close outpatient follow up to consider loop.   Full note to follow.   Legrand Como [address] [place], [place]

## 2020-09-01 NOTE — Progress Notes (Signed)
Physical Therapy Treatment Patient Details Name: Morgan Dixon MRN: [id number] DOB: 1950-08-20 Today's Date: 09/01/2020    History of Present Illness Pt is 70 yo female who presented to hospital with episode of confusion on 08/17/20.  She was admitted with L parietal subacute infarct.  CT on 08/31/20 showed new small hemorrhage - CT on 09/01/20 was stable.  Pt additionally found to have heme positive stool but hgb remained stable - no further workup.  Pt found to have new L renal mass. She has PMH of gastric bypass on 07/19/2018, HTN, and back pain with spinal cord stimulator.  Pt recently lost her husband to cancer (January 2022)    PT Comments    Pt making good progress.  Session focused on ambulation, stair training, and helping pt problem solve options/safety at home if SNF appeal is denied by insurance.  Pt expressing fear of falling if returns home as she lives alone -Pt tearful and distressed over denial of SNF, stating "where am I going to go?"  Reports she has limited availability for assist at home (dtr lives 45 mins away and works, spouse just died, friend that helped last time now has other obligations). She was able to ambulate 150' with RW and supervision and performing toileting ADLs independently at arrival.  Continue to progress as able.  .  Follow Up Recommendations  Other (comment) (SNF vs home with HH therapies and intermittent supervision if SNF denied)     Equipment Recommendations  None recommended by PT    Recommendations for Other Services       Precautions / Restrictions Precautions Precautions: Fall Restrictions Weight Bearing Restrictions: No    Mobility  Bed Mobility Overal bed mobility: Modified Independent Bed Mobility: Sit to Supine       Sit to supine: Supervision   General bed mobility comments: At arrival pt was walking out of the bathroom alone - she has been getting up and OOB and going to bathroom modified independent using RW and performing  her ADLs.    Transfers Overall transfer level: Needs assistance Equipment used: Rolling walker (2 wheeled);4-wheeled walker Transfers: Sit to/from Stand Sit to Stand: Supervision         General transfer comment: Performed sit to stand x 3 with supervision.  Educated on use of rollator brakes during transfers  Ambulation/Gait Ambulation/Gait assistance: Supervision Gait Distance (Feet): 150 Feet (150' then 30') Assistive device: Rolling walker (2 wheeled);4-wheeled walker Gait Pattern/deviations: Step-through pattern;Decreased stride length Gait velocity: decreased   General Gait Details: Used RW initially and progressed to rollator as pt has rollator at home and rollator could be used to assist with IADLs (carrying food/drinks) at home. Educated on rollator use and brakes.  Pt had no LOB with gait but did ambulate at slow speed and required cues for posture and RW proximity   Stairs Stairs: Yes Stairs assistance: Min guard Stair Management: Two rails;Step to pattern Number of Stairs: 4 General stair comments: Performed 4 steps with min guard for safety.   Wheelchair Mobility    Modified Rankin (Stroke Patients Only) Modified Rankin (Stroke Patients Only) Pre-Morbid Rankin Score: No symptoms Modified Rankin: Moderate disability     Balance Overall balance assessment: Needs assistance Sitting-balance support: Feet supported Sitting balance-Leahy Scale: Good     Standing balance support: No upper extremity supported Standing balance-Leahy Scale: Fair Standing balance comment: Utilizing RW for ambulation but able to static stand and perform ADLs without UE support  Cognition Arousal/Alertness: Awake/alert Behavior During Therapy: WFL for tasks assessed/performed Overall Cognitive Status: Impaired/Different from baseline Area of Impairment: Memory                     Memory: Decreased short-term memory          General Comments: Continues to demonstrate expressive difficulties, word finding. Improves when pt slows down and focuses on single task.      Exercises      General Comments General comments (skin integrity, edema, etc.): VSS on RA>  Noted plan was for SNF but insurance denied SNF placement.  Pt and daughter are appealing denial.  Encouraged and discussed with pt options if SNF denied again.  She reports she cannot stay with her dtr b/c dtr only has 1 bedroom and dtr cannot stay with her as she lives 45 mins away and works.  Pt stayed with a friend after last hospitalization but this friend now has her mother staying and can no longer assist.  [name] spouse recently passed away and pt lives alone.  Pt very tearful and frustrated about insurance denial.  States "Where am I going to go?"  Tried to work through her concerns about going home and her specific "fears" about being at home as she is walking without assist and performing basic ADLs on her own.  She expressed fear of falling (fall just prior to admission) and concerns about IADLs (cooking, getting meat from freezer that is upstairs, etc).  Pt has ramp to enter and bed/bath on main floor. Discussed use of rollator to carry items, fixing simple microwave meals, and having intermittent supervision.       Pertinent Vitals/Pain Pain Assessment: No/denies pain Pain Score: 3  Pain Location: headache Pain Descriptors / Indicators: Discomfort;Dull Pain Intervention(s): Monitored during session    Home Living                      Prior Function            PT Goals (current goals can now be found in the care plan section) Acute Rehab PT Goals Patient Stated Goal: hoping to go to rehab PT Goal Formulation: With patient Time For Goal Achievement: 09/02/20 Potential to Achieve Goals: Good Progress towards PT goals: Progressing toward goals    Frequency    Min 4X/week      PT Plan Frequency needs to be updated     Co-evaluation              AM-PAC PT "6 Clicks" Mobility   Outcome Measure  Help needed turning from your back to your side while in a flat bed without using bedrails?: None Help needed moving from lying on your back to sitting on the side of a flat bed without using bedrails?: None Help needed moving to and from a bed to a chair (including a wheelchair)?: None Help needed standing up from a chair using your arms (e.g., wheelchair or bedside chair)?: A Little Help needed to walk in hospital room?: A Little Help needed climbing 3-5 steps with a railing? : A Little 6 Click Score: 21    End of Session Equipment Utilized During Treatment: Gait belt Activity Tolerance: Patient tolerated treatment well Patient left: in bed;with call bell/phone within reach Nurse Communication: Mobility status PT Visit Diagnosis: Unsteadiness on feet (R26.81);Other abnormalities of gait and mobility (R26.89);Muscle weakness (generalized) (M62.81)     Time: 5102-5852 PT Time Calculation (min) (ACUTE ONLY): 25  min  Charges:  $Gait Training: 8-22 mins $Therapeutic Activity: 8-22 mins                     Abran Richard, PT Acute Rehab Services Pager [phone] [facility] [place] 09/01/2020, 5:24 PM

## 2020-09-01 NOTE — Progress Notes (Signed)
Electrophysiology Rounding Note  Patient Name: Morgan Dixon Date of Encounter: 09/01/2020  Primary Cardiologist: None Electrophysiologist: Dr. Quentin Ore   Subjective   The patient is doing well today. EP asked to come back and comment on monitoring as she's been here for 2 more weeks and her abdominal wound continues to heal.  Pt continues to have mild expressive aphasia including word finding difficulty, and continues to progress with PT.   Inpatient Medications    Scheduled Meds: . amLODipine  5 mg Oral Daily  . aspirin  81 mg Oral Daily  . atorvastatin  40 mg Oral Daily  . buPROPion  300 mg Oral Daily  . carbamide peroxide  5 drop Both EARS BID  . chlorpheniramine-HYDROcodone  5 mL Oral QHS  . guaiFENesin  600 mg Oral BID  . losartan  25 mg Oral Daily  . neomycin-polymyxin-hydrocortisone  3 drop Left EAR Q6H  . oxymetazoline  1 spray Each Nare BID  . pantoprazole  40 mg Oral QHS  . polyethylene glycol  17 g Oral Daily  . pregabalin  200 mg Oral BID  . sertraline  50 mg Oral Daily   Continuous Infusions:  PRN Meds: acetaminophen, chlorpheniramine-HYDROcodone, menthol-cetylpyridinium, ondansetron (ZOFRAN) IV, tiZANidine   Vital Signs    Vitals:   09/01/20 0351 09/01/20 0832 09/01/20 1230 09/01/20 1947  BP: (!) 119/57 106/64 136/73 125/62  Pulse: (!) 47 (!) 49 65 (!) 57  Resp: 18 17 16 19   Temp: 97.7 F (36.5 C) (!) 97.5 F (36.4 C) 98.4 F (36.9 C) 98 F (36.7 C)  TempSrc: Oral Oral Oral Oral  SpO2: 99% 99% 98% 99%  Weight:      Height:        Intake/Output Summary (Last 24 hours) at 09/01/2020 2011 Last data filed at 09/01/2020 1300 Gross per 24 hour  Intake 840 ml  Output --  Net 840 ml   Filed Weights   08/17/20 1352 08/19/20 0328  Weight: 97.5 kg 99.8 kg    Physical Exam    GEN- The patient is well appearing, alert and oriented x 3 today.   Head- normocephalic, atraumatic Eyes-  Sclera clear, conjunctiva pink Ears- hearing  intact Oropharynx- clear Neck- supple Lungs- Clear to ausculation bilaterally, normal work of breathing Heart- Regular rate and rhythm, no murmurs, rubs or gallops GI- soft, NT, ND, + BS Extremities- no clubbing or cyanosis. No edema Skin- no rash or lesion Psych- euthymic mood, full affect Neuro- strength and sensation are intact  Labs    CBC Recent Labs    08/31/20 0330  WBC 4.9  HGB 10.7*  HCT 33.1*  MCV 91.7  PLT 010   Basic Metabolic Panel Recent Labs    09/01/20 0424  NA 136  K 4.2  CL 105  CO2 27  GLUCOSE 116*  BUN 27*  CREATININE 1.08*  CALCIUM 9.2  MG 2.2   Liver Function Tests No results for input(s): AST, ALT, ALKPHOS, BILITOT, PROT, ALBUMIN in the last 72 hours. No results for input(s): LIPASE, AMYLASE in the last 72 hours. Cardiac Enzymes No results for input(s): CKTOTAL, CKMB, CKMBINDEX, TROPONINI in the last 72 hours.   Telemetry    Sinus brady and NSR 40-60s (personally reviewed)  Radiology    CT HEAD WO CONTRAST  Result Date: 09/01/2020 CLINICAL DATA:  Stroke, follow-up. EXAM: CT HEAD WITHOUT CONTRAST TECHNIQUE: Contiguous axial images were obtained from the base of the skull through the vertex without intravenous contrast. COMPARISON:  Prior head CT examinations 08/31/2020 and earlier. FINDINGS: Brain: Mild cerebral and cerebellar atrophy. Unchanged extent of abnormal hypoattenuation within the left parietal lobe. This is again favored to reflect a subacute infarct. Unchanged small foci of acute hemorrhage at this site, measuring up to 6 mm (series 5, image 46) (series 2, image 13). No new sites of hemorrhage are identified. As before, there is mild local mass effect. No ventricular effacement or midline shift. Vascular: No hyperdense vessel.  Atherosclerotic calcifications. Skull: Normal. Negative for fracture or focal lesion. Sinuses/Orbits: Visualized orbits show no acute finding. Mild left frontal and bilateral ethmoid sinus mucosal  thickening. Mild mucosal thickening and small volume frothy secretions within the right maxillary sinus. Mild left maxillary sinus mucosal thickening. IMPRESSION: No significant interval change as compared to the head CT of 08/31/2020. Unchanged extent of abnormal hypoattenuation within the left parietal lobe, likely reflecting a subacute infarct. Unchanged small foci of acute hemorrhage at this site. Stable mild generalized parenchymal atrophy. Paranasal sinus disease, as described. Electronically Signed   By: Kellie Simmering DO   On: 09/01/2020 15:24   CT HEAD WO CONTRAST  Result Date: 08/31/2020 CLINICAL DATA:  Worsening headaches EXAM: CT HEAD WITHOUT CONTRAST TECHNIQUE: Contiguous axial images were obtained from the base of the skull through the vertex without intravenous contrast. COMPARISON:  08/20/2020, 08/17/2020 FINDINGS: Brain: There is a geographic area of decreased attenuation identified in the left posterior parietal lobe consistent with the previously diagnosed subacute infarct. No new infarct is seen. A small focus of increased attenuation is noted within the parenchyma consistent with a small focus of hemorrhage. Vascular: No hyperdense vessel or unexpected calcification. Skull: Normal. Negative for fracture or focal lesion. Sinuses/Orbits: No acute finding. Other: None. IMPRESSION: Changes consistent with the known left posterior parietal subacute infarct. There is however a new focus of increased attenuation best seen on image number 20 of series 3 consistent with a small focus of hemorrhage. These results will be called to the ordering clinician or representative by the Radiologist Assistant, and communication documented in the PACS or [employer]. Electronically Signed   By: Inez Catalina M.D.   On: 08/31/2020 22:07    Patient Profile     Morgan Dixon was admitted on 08/17/2020 with AMS at [facility], initially w/u for metabolic etiology, head imaging  in the form of CT head  reporting a left parietal temporal lobe hypodensity concerning for mass as well as a left renal mass.Further imaging of the head with CT head with contrast revealed a possible subacute stroke rather than a mass in the left parietal area. Patient is not able to undergo an MRI due to a spinal stimulator.    Assessment & Plan    1. Cryptogenic stroke Pt continues to improve and is awaiting SNF placement with complications given insurance authorization as noted in CSW notes from today.  Discussed options for monitoring again at length with patient today.  Her abdominal wound is well healed, with only a small area of eschar remaining. As before, would prefer to give this as much time as possible to heal and with live monitoring, we do not risk missing early atrial fib after discharge.  With shared decision making in discussing with patient, we will plan Zio AT application on discharge with close follow up in [place] to discuss and likely implant loop if Zio negative. If patient discharges over weekend, we will send Zio to patients home or facility.   As above, patient does  have spinal stimulator. Not sure how significantly this will affect baseline of Loop recordings.   EP will continue to follow at a distance for discharge timing. Please alert [place] on day of likely discharge. Zio monitors can be placed during the week, prior to 3 pm. Otherwise, will need to be mailed to patient.   For questions or updates, please contact [place] Please consult [email or web address] for contact info under Cardiology/STEMI.  Signed, Shirley Friar, PA-C  09/01/2020, 8:11 PM

## 2020-09-01 NOTE — Progress Notes (Signed)
Occupational Therapy Treatment Patient Details Name: Morgan Dixon MRN: [id number] DOB: 01-15-1951 Today's Date: 09/01/2020    History of present illness Pt is 70 yo female who presents to [facility] with AMS and dysphasia with CT showing L parietal hypodensity concerning for CVA vs mass as well as L renal mass. Pt also with abdominal wound and possible GI bleed. PMH: gastric bypass surgery, HTN, chronic back pain with spinal cord stimulator.   OT comments  Patient seen this date for updated note/treatment.   Patient initially walked in the hall with RW and supervision to Mod I and no loss of balance.  Patient challenged in her room with mobility without the RW.  Patient able to walk to the bathroom without RW and supervision, and reaching for objects in her environment to stabilize herself.  Patient able to reach to overhead shelf in bathroom for towels, walk them to the sink, fold them and return them to overhead shelf with Min Guard and one loss of balance.  Patient able to catch her balance and no assist needed.  She was able to perform lower body dressing from sit to stand level with setup and supervision.  Barriers are listed below.  Patient would prefer to go to a SNF for continued rehab to ensure a safe transition home.  OT will continue to follow in the acute setting.    Follow Up Recommendations  SNF    Equipment Recommendations  3 in 1 bedside commode    Recommendations for Other Services      Precautions / Restrictions Precautions Precautions: Fall Restrictions Weight Bearing Restrictions: No       Mobility Bed Mobility Overal bed mobility: Modified Independent               Patient Response: Cooperative  Transfers Overall transfer level: Needs assistance Equipment used: None Transfers: Sit to/from Stand Sit to Stand: Min guard              Balance Overall balance assessment: Needs assistance Sitting-balance support: Feet supported Sitting balance-Leahy  Scale: Good     Standing balance support: No upper extremity supported Standing balance-Leahy Scale: Fair                             ADL either performed or assessed with clinical judgement   ADL       Grooming: Supervision/safety;Standing           Upper Body Dressing : Sitting;Supervision/safety   Lower Body Dressing: Supervision/safety;Sit to/from stand   Toilet Transfer: Supervision/safety;Ambulation                                     Cognition   Behavior During Therapy: [facility] for tasks assessed/performed                                   General Comments: Continues to demonstrate expressive difficulties, word finding. Improves when pt slows down and focuses on single task.        Exercises     Shoulder Instructions       General Comments      Pertinent Vitals/ Pain       Pain Assessment: 0-10 Pain Score: 3  Pain Location: headache Pain Descriptors / Indicators: Discomfort;Dull Pain Intervention(s): Monitored during session  Frequency  Min 2X/week        Progress Toward Goals  OT Goals(current goals can now be found in the care plan section)  Progress towards OT goals: Progressing toward goals  Acute Rehab OT Goals Patient Stated Goal: hoping to go to rehab OT Goal Formulation: With patient Time For Goal Achievement: 09/05/20 Potential to Achieve Goals: Good  Plan                       AM-PAC OT "6 Clicks" Daily Activity     Outcome Measure   Help from another person eating meals?: None Help from another person taking care of personal grooming?: None Help from another person toileting, which includes using toliet, bedpan, or urinal?: A Little Help from another person bathing (including washing, rinsing, drying)?: A Little Help from another person to put on and taking off regular upper body clothing?:  None Help from another person to put on and taking off regular lower body clothing?: A Little 6 Click Score: 21    End of Session Equipment Utilized During Treatment: Rolling walker  OT Visit Diagnosis: Other abnormalities of gait and mobility (R26.89);Unsteadiness on feet (R26.81);Muscle weakness (generalized) (M62.81)   Activity Tolerance Patient tolerated treatment well   Patient Left in bed;with call bell/phone within reach   Nurse Communication          Time: 5397-6734 OT Time Calculation (min): 22 min  Charges: OT General Charges $OT Visit: 1 Visit OT Treatments $Self Care/Home Management : 8-22 mins  09/01/2020  Rich, OTR/L  Acute Rehabilitation Services  Office:  [place] 09/01/2020, 4:00 PM

## 2020-09-01 NOTE — Progress Notes (Signed)
SLP Cancellation Note  Patient Details Name: Morgan Dixon MRN: [id number] DOB: 22-Oct-1950   Cancelled treatment:       Reason Eval/Treat Not Completed: Other (comment) (Pt working with PT) Will follow up as able  [place], [place]   09/01/2020, 3:42 PM

## 2020-09-01 NOTE — Progress Notes (Signed)
PROGRESS NOTE    Morgan Dixon   ZOX:[id number]  DOB: 1950-10-10  DOA: 08/17/2020 PCP: Alfonse Flavors, MD   Brief Narrative:   Morgan Dixon is a 70 year old female with past medical history of gastric bypass on 07/19/2018 hypertension and chronic back pain presented to the hospital after episode of confusion.  Patient was noted to have left parietal subacute infarct and was admitted to hospital for further evaluation and treatment.   Assessment & Plan:   Principal Problem:   CVA (cerebral vascular accident) ([place]) Active Problems:   Hypokalemia   Left renal mass   Acute metabolic encephalopathy   Obesity   Benign essential HTN   CVA with expressive aphasia and right UE weakness, new small hemorrhage. Initial CT scan of the head showed hypoattenuation of the left temporoparietal area.  MRI was unable to be obtained due to spinal stimulator.  CT venogram showed some leptomeningeal contrast over the area of the abnormal density consistent with subacute infarct.  2D echocardiogram did not show any evidence of thrombus.  Neurology was consulted and recommended aspirin and Plavix for 3 weeks followed by Plavix alone.  Patient continued to have a headache so CT head scan was done yesterday which showed a small area of hemorrhage.  I spoke with Dr Erlinda Hong neurology who recommended to hold Plavix for few days.  Patient will need a loop recorder as outpatient.  Continue speech therapy statins.  Will need continued speech therapy for expressive aphasia.  Electrophysiology plans to do loop as outpatient but will need Zio patch on day of discharge.  Neurology has notified cardiology about Zio patch.  Essential hypertension Currently on amlodipine and losartan.  Patient was on Catapres and HCTZ at home.  Blood pressure seems to be stable at this time.  Cough with productive sputum.  Improved.  Continue supportive care including Mucinex, flutter valve.  No fever or leukocytosis.  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 -s/p 2021 venting gastrostomy tube (now removed) - status post laparoscopic distal gastrectomy 3/29 status post incisional hernia repair on 4/4.  GI was consulted while the patient was at [facility] and prefer not to do work-up in the acute neurological state.  On aspirin and Plavix which will need to be continued.  Hemoglobin has however remained stable since admission.  hem Occult repeated on 5/14 was negative.  Latest hemoglobin of 10.7 at this time.  Anterior abdominal wall wound from prior PEG tube In the process of healing.  Tylenol as needed for pain.  Anxiety-  Patient stated that her husband had passed away recently on 2020/05/06.  Zoloft has been resumed and increased to 50 mg as Wellbutrin has been resumed half dose.  We will gradually need to wean Wellbutrin since it has higher risk of seizures.   Acute metabolic encephalopathy-  Improved at this time.  As per the family, patient has had some memory issues starting 2012.  Obesity Body mass index is 35.51 kg/m. Status post gastric bypass.  Left renal mass CT abd/pelvis > Small left renal cortical mass, posteromedial midpole, which is larger than prior exam.  Possibility of a small renal neoplasm.  Recommended CT scan of the abdomen with and without contrast in 3 to 6 months since the patient is unable to take MRI due to spinal stimulator.    H/o DVT with PE   Headache with some sinus tenderness.  Added Afrin and Tylenol yesterday but he still complains of headache.  Has microhemorrhage in the brain on the CT head scan.  Spoke with neurology about it.  Hold Plavix for now.  DVT prophylaxis: SCDs Start: 08/18/20 0050   Code Status:  Full code  Family Communication:  None today  Level of Care:  Med-Surg   Disposition Plan:  Status is: Inpatient  Remains inpatient appropriate  because:Unsafe d/c plan- PT recommends SNF  Dispo: The patient is from: Home              Anticipated d/c is to: Undetermined, denied skilled nursing facility placement.  Family is going to appeal discharge. Was             Patient currently is medically stable to d/c.     Difficult to place patient Yes    Consultants:  Neurology EP cardiology  Procedures:  None  Antimicrobials:  None  Anti-infectives (From admission, onward)   Start     Dose/Rate Route Frequency Ordered Stop   08/17/20 2130  cefTRIAXone (ROCEPHIN) 1 g in sodium chloride 0.9 % 100 mL IVPB  Status:  Discontinued        1 g 200 mL/hr over 30 Minutes Intravenous Every 24 hours 08/17/20 2115 08/19/20 0955     Subjective:  Today, patient was seen and examined at bedside. Complains of mild headache.  Denies any nausea vomiting, dizziness lightheadedness or shortness of breath.   Objective: Vitals:   08/31/20 2343 09/01/20 0351 09/01/20 0832 09/01/20 1230  BP: (!) 106/54 (!) 119/57 106/64 136/73  Pulse: (!) 44 (!) 47 (!) 49 65  Resp: 17 18 17 16   Temp: 97.7 F (36.5 C) 97.7 F (36.5 C) (!) 97.5 F (36.4 C) 98.4 F (36.9 C)  TempSrc:  Oral Oral Oral  SpO2: 97% 99% 99% 98%  Weight:      Height:        Intake/Output Summary (Last 24 hours) at 09/01/2020 1400 Last data filed at 09/01/2020 0900 Gross per 24 hour  Intake 860 ml  Output --  Net 860 ml   Filed Weights   08/17/20 1352 08/19/20 0328  Weight: 97.5 kg 99.8 kg    Physical examination: General:  Average built, not in obvious distress HENT:   No scleral pallor or icterus noted. Oral mucosa is moist.  Left sinus tenderness on palpation. Chest:  Clear breath sounds.  Diminished breath sounds bilaterally. No crackles or wheezes.  CVS: S1 &S2 heard. No murmur.  Regular rate and rhythm. Abdomen: Soft, nontender, nondistended.  Bowel sounds are heard.   Extremities: No cyanosis, clubbing or edema.  Peripheral pulses are palpable. Psych: Alert,  awake and oriented, normal mood, CNS: Expressive aphasia, right hand and forearm strength 4/5.   Skin: Warm and dry.  No rashes noted.   Data Reviewed: I have personally reviewed following labs and imaging studies  CBC: Recent Labs  Lab 08/28/20 0823 08/31/20 0330  WBC 5.1 4.9  HGB 11.8* 10.7*  HCT 36.2 33.1*  MCV 90.3 91.7  PLT 256 854   Basic Metabolic Panel: Recent Labs  Lab 09/01/20 0424  NA 136  K 4.2  CL 105  CO2 27  GLUCOSE 116*  BUN 27*  CREATININE 1.08*  CALCIUM 9.2  MG 2.2   GFR: Estimated Creatinine Clearance: 58.6 mL/min (A) (by C-G formula based on SCr of 1.08 mg/dL (H)). Liver Function Tests: No results for input(s): AST, ALT, ALKPHOS, BILITOT, PROT, ALBUMIN in the last  168 hours. No results for input(s): LIPASE, AMYLASE in the last 168 hours. No results for input(s): AMMONIA in the last 168 hours. Coagulation Profile: No results for input(s): INR, PROTIME in the last 168 hours. Cardiac Enzymes: No results for input(s): CKTOTAL, CKMB, CKMBINDEX, TROPONINI in the last 168 hours. BNP (last 3 results) No results for input(s): PROBNP in the last 8760 hours. HbA1C: No results for input(s): HGBA1C in the last 72 hours. CBG: Recent Labs  Lab 08/26/20 0023 08/26/20 1239 08/26/20 2351 08/27/20 0624  GLUCAP 94 138* 115* 89   Lipid Profile: No results for input(s): CHOL, HDL, LDLCALC, TRIG, CHOLHDL, LDLDIRECT in the last 72 hours. Thyroid Function Tests: No results for input(s): TSH, T4TOTAL, FREET4, T3FREE, THYROIDAB in the last 72 hours. Anemia Panel: No results for input(s): VITAMINB12, FOLATE, FERRITIN, TIBC, IRON, RETICCTPCT in the last 72 hours. Urine analysis:    Component Value Date/Time   COLORURINE YELLOW 08/17/2020 2100   APPEARANCEUR CLEAR 08/17/2020 2100   LABSPEC 1.042 (H) 08/17/2020 2100   PHURINE 6.0 08/17/2020 2100   GLUCOSEU NEGATIVE 08/17/2020 2100   HGBUR SMALL (A) 08/17/2020 2100   [place] NEGATIVE 08/17/2020 2100    [place] NEGATIVE 08/17/2020 2100   PROTEINUR NEGATIVE 08/17/2020 2100   NITRITE NEGATIVE 08/17/2020 2100   LEUKOCYTESUR NEGATIVE 08/17/2020 2100   Sepsis Labs: @LABRCNTIP (procalcitonin:4,lacticidven:4) ) Recent Results (from the past 240 hour(s))  Resp Panel by RT-PCR (Flu A&B, Covid) Nasopharyngeal Swab     Status: None   Collection Time: 08/28/20 10:16 AM   Specimen: Nasopharyngeal Swab; Nasopharyngeal(NP) swabs in vial transport medium  Result Value Ref Range Status   SARS Coronavirus 2 by RT PCR NEGATIVE NEGATIVE Final    Comment: (NOTE) SARS-CoV-2 target nucleic acids are NOT DETECTED.  The SARS-CoV-2 RNA is generally detectable in upper respiratory specimens during the acute phase of infection. The lowest concentration of SARS-CoV-2 viral copies this assay can detect is 138 copies/mL. A negative result does not preclude SARS-Cov-2 infection and should not be used as the sole basis for treatment or other patient management decisions. A negative result may occur with  improper specimen collection/handling, submission of specimen other than nasopharyngeal swab, presence of viral mutation(s) within the areas targeted by this assay, and inadequate number of viral copies(<138 copies/mL). A negative result must be combined with clinical observations, patient history, and epidemiological information. The expected result is Negative.  Fact Sheet for Patients:  EntrepreneurPulse.com.au  Fact Sheet for Healthcare Providers:  IncredibleEmployment.be  This test is no t yet approved or cleared by the [place] FDA and  has been authorized for detection and/or diagnosis of SARS-CoV-2 by FDA under an Emergency Use Authorization (EUA). This EUA will remain  in effect (meaning this test can be used) for the duration of the COVID-19 declaration under Section 564(b)(1) of the Act, 21 U.S.C.section 360bbb-3(b)(1), unless the authorization is  terminated  or revoked sooner.       Influenza A by PCR NEGATIVE NEGATIVE Final   Influenza B by PCR NEGATIVE NEGATIVE Final    Comment: (NOTE) The Xpert Xpress SARS-CoV-2/FLU/RSV plus assay is intended as an aid in the diagnosis of influenza from Nasopharyngeal swab specimens and should not be used as a sole basis for treatment. Nasal washings and aspirates are unacceptable for Xpert Xpress SARS-CoV-2/FLU/RSV testing.  Fact Sheet for Patients: EntrepreneurPulse.com.au  Fact Sheet for Healthcare Providers: IncredibleEmployment.be  This test is not yet approved or cleared by the [place] FDA and has been authorized for detection  and/or diagnosis of SARS-CoV-2 by FDA under an Emergency Use Authorization (EUA). This EUA will remain in effect (meaning this test can be used) for the duration of the COVID-19 declaration under Section 564(b)(1) of the Act, 21 U.S.C. section 360bbb-3(b)(1), unless the authorization is terminated or revoked.  Performed at [facility], [place] [address] [place], [place] [address]      Radiology Studies: CT HEAD WO CONTRAST  Result Date: 08/31/2020 CLINICAL DATA:  Worsening headaches EXAM: CT HEAD WITHOUT CONTRAST TECHNIQUE: Contiguous axial images were obtained from the base of the skull through the vertex without intravenous contrast. COMPARISON:  08/20/2020, 08/17/2020 FINDINGS: Brain: There is a geographic area of decreased attenuation identified in the left posterior parietal lobe consistent with the previously diagnosed subacute infarct. No new infarct is seen. A small focus of increased attenuation is noted within the parenchyma consistent with a small focus of hemorrhage. Vascular: No hyperdense vessel or unexpected calcification. Skull: Normal. Negative for fracture or focal lesion. Sinuses/Orbits: No acute finding. Other: None. IMPRESSION: Changes consistent with the known left posterior parietal  subacute infarct. There is however a new focus of increased attenuation best seen on image number 20 of series 3 consistent with a small focus of hemorrhage. These results will be called to the ordering clinician or representative by the Radiologist Assistant, and communication documented in the PACS or [employer]. Electronically Signed   By: Inez Catalina M.D.   On: 08/31/2020 22:07     Scheduled Meds: . amLODipine  5 mg Oral Daily  . aspirin  81 mg Oral Daily  . atorvastatin  40 mg Oral Daily  . buPROPion  300 mg Oral Daily  . carbamide peroxide  5 drop Both EARS BID  . chlorpheniramine-HYDROcodone  5 mL Oral QHS  . guaiFENesin  600 mg Oral BID  . losartan  25 mg Oral Daily  . neomycin-polymyxin-hydrocortisone  3 drop Left EAR Q6H  . oxymetazoline  1 spray Each Nare BID  . pantoprazole  40 mg Oral QHS  . polyethylene glycol  17 g Oral Daily  . pregabalin  200 mg Oral BID  . sertraline  50 mg Oral Daily   Continuous Infusions:   LOS: 15 days    Flora Lipps, MD [facility] 09/01/2020, 2:00 PM

## 2020-09-01 NOTE — Progress Notes (Signed)
STROKE TEAM PROGRESS NOTE   INTERVAL HISTORY No family at the bedside. Pt sitting in chair, depressive mood with intermittent crying and worrying about her speech not getting better. I told her that speech could fluctuate with mood, anxiety, etc. With less anxiety, depression, the speech can improve over time. Contacted EP again regarding decision for Zio vs. Loop on discharge.   Pt stated that since the stroke this time, she had HA at right frontotemporal area, tylenol helps but HA would come back. No improvement over time. This triggered CT head repeat which showed mild hemorrhagic conversion of left posterior MCA infarct.    OBJECTIVE Vitals:   08/31/20 2343 09/01/20 0351 09/01/20 0832 09/01/20 1230  BP: (!) 106/54 (!) 119/57 106/64 136/73  Pulse: (!) 44 (!) 47 (!) 49 65  Resp: 17 18 17 16   Temp: 97.7 F (36.5 C) 97.7 F (36.5 C) (!) 97.5 F (36.4 C) 98.4 F (36.9 C)  TempSrc:  Oral Oral Oral  SpO2: 97% 99% 99% 98%  Weight:      Height:        CBC:  Recent Labs  Lab 08/28/20 0823 08/31/20 0330  WBC 5.1 4.9  HGB 11.8* 10.7*  HCT 36.2 33.1*  MCV 90.3 91.7  PLT 256 387    Basic Metabolic Panel:  Recent Labs  Lab 09/01/20 0424  NA 136  K 4.2  CL 105  CO2 27  GLUCOSE 116*  BUN 27*  CREATININE 1.08*  CALCIUM 9.2  MG 2.2    Lipid Panel:     Component Value Date/Time   CHOL 162 08/21/2020 0238   TRIG 81 08/21/2020 0238   HDL 58 08/21/2020 0238   CHOLHDL 2.8 08/21/2020 0238   VLDL 16 08/21/2020 0238   LDLCALC 88 08/21/2020 0238   HgbA1c:  Lab Results  Component Value Date   HGBA1C 4.9 08/21/2020   Urine Drug Screen:     Component Value Date/Time   LABOPIA NONE DETECTED 08/17/2020 1615   COCAINSCRNUR NONE DETECTED 08/17/2020 1615   COCAINSCRNUR NONE DETECTED 05/24/2020 1959   LABBENZ NONE DETECTED 08/17/2020 1615   AMPHETMU NONE DETECTED 08/17/2020 1615   THCU NONE DETECTED 08/17/2020 1615   LABBARB NONE DETECTED 08/17/2020 1615    Alcohol Level      Component Value Date/Time   ETH <10 05/24/2020 1717    IMAGING  CT ANGIO HEAD W OR WO CONTRAST  CT ANGIO NECK W OR WO CONTRAST 08/20/2020 IMPRESSION:  Normal CTA of the head and neck.  CT HEAD W & WO CONTRAST 08/20/2020 IMPRESSION:  1. Stable appearance of cortical and subcortical hypoattenuation in the left posterior frontal and parietal lobe. There is some enhancement the cortex without a discrete mass lesion. Neoplasm is considered unlikely. This is concerning for a venous infarct. Arterial infarct considered less likely. Given the patient cannot have MRI, follow-up CT recommended to evaluate for expected evolution.  2. Stable atrophy and white matter disease. This likely reflects the sequela of chronic microvascular ischemia. 3. Atherosclerosis.   CT VENOGRAM HEAD 08/20/2020 IMPRESSION:  1. No dural venous sinus thrombosis.  2. Enhancing leptomeningeal contrast over the area of abnormal density in the left parietal lobe, consistent with subacute infarct.   DG CHEST PORT 1 VIEW 08/20/2020 IMPRESSION:  No active disease.   ECHOCARDIOGRAM COMPLETE 08/21/2020 IMPRESSIONS   1. Left ventricular ejection fraction, by estimation, is 60 to 65%. The left ventricle has normal function. The left ventricle has no regional wall motion abnormalities. Left  ventricular diastolic parameters were normal.   2. Right ventricular systolic function is normal. The right ventricular size is normal.   3. Left atrial size was mildly dilated.   4. The mitral valve is normal in structure. No evidence of mitral valve regurgitation. No evidence of mitral stenosis.   5. The aortic valve is normal in structure. Aortic valve regurgitation is not visualized. No aortic stenosis is present.   6. The inferior vena cava is normal in size with greater than 50% respiratory variability, suggesting right atrial pressure of 3 mmHg.   ECG - SB rate 59 BPM. (See cardiology reading for complete details)   PHYSICAL  EXAM Blood pressure 136/73, pulse 65, temperature 98.4 F (36.9 C), temperature source Oral, resp. rate 16, height 5\' 6"  (1.676 m), weight 99.8 kg, SpO2 98 %.   GENERAL:  She is doing well at this time  HEENT:  normal  EXTREMITIES: No edema   SKIN: Normal by inspection.  CARD: normal rate and rhythm    Neuro - awake, alert, eyes open, orientated to age, place, time and people. Mild expressive aphasia, intermittent word finding difficulty and paraphasic errors, following all simple commands. Able to name and repeat and read. No gaze palsy, tracking bilaterally, visual field full, PERRL. No facial droop. Tongue midline. Bilateral UEs 5/5, no drift. Bilaterally LEs 5/5, no drift. Sensation symmetrical bilaterally, b/l FTN intact, gait not tested.     ASSESSMENT/PLAN [name] is a 70 y.o. female with history of chronic back pain/chronic pain, hypertension, status post prior gastric bypass surgery, and s/p spinal stimulator implant. presenting with AMS and speech difficulties. 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  MRI head - not able to do due to spinal stimulator  CTV head - no dural venous thrombosis  CTA H&N - negative  CT head 5/18 - a small focus of hemorrhagic conversion  CT head 5/19 - stable hemorrhagic conversion, no new finding  2D Echo - EF 60 - 65%  EP on board for plan of cardiac monitoring - currently recommend Zio patch on discharge, if negative, consider loop as outpt.  Sars Corona Virus 2 - negative  LDL - 88  HgbA1c - 4.9  UDS - negative  VTE prophylaxis - SCDs  aspirin 81 mg daily prior to admission, was on ASA 81 and plavix DAPT.  Due to small hemorrhagic conversion, will d/c plavix. Will continue ASA 81 on discharge.    Patient will be counseled to be compliant with her antithrombotic medications  Ongoing aggressive stroke risk factor management  Therapy recommendations SNF  Disposition:  Pending  Hypertension  Home BP meds: HCTZ ; Catapres ; Zestril  Current BP meds: Norvasc, losartan  Stable . Long-term BP goal normotensive  Hyperlipidemia  Home Lipid lowering medication: none   LDL 88, goal < 70  Current lipid lowering medication lipitor 40  Continue statin at discharge  Depression  Depressed mood post stroke  On home zoloft and wellbutrin   Other Stroke Risk Factors  Advanced age  Obesity, Body mass index is 35.51 kg/m., recommend weight loss, diet and exercise as appropriate    Other Active Problems, Findings, Recommendations and/or Plan  AKI Cre 0.89->1.08 - encourage po intake   Hospital day # 15  Neurology will sign off. Please call with questions. Pt will follow up with stroke clinic NP at [facility] in about 4 weeks. Thanks for the consult.  Rosalin Hawking, MD PhD Stroke Neurology 09/01/2020 5:31 PM

## 2020-09-01 NOTE — TOC Progression Note (Addendum)
Transition of Care [facility]) - Progression Note    Patient Details  Name: Morgan Dixon MRN: [id number] Date of Birth: 1950-07-16  Transition of Care [facility] CM/SW [place], [place] Phone Number: 09/01/2020, 1:50 PM  Clinical Narrative:     CSW is notified at 39 that SNF [facility] is denied. Reason unknown at this time. CSW is informed that auth denied by Dr. Jerene Pitch and a peer to peer is offered by 1200 today. CSW received this message after peer to peer deadline. Liaison is attempting to get an extension. CSW notified MD. MD would call [phone] to complete peer to peer.   1444: MD attempted peer to peer but was informed that the deadline was missed. CSW called pt daughter and notified her of denial. CSW explained that daughter could initiate an expedited appeal. CSW provided instructions to daughter on how she would do that. Daughter plans to appeal snf denial with insurance company.   46: Daughter called CSW and explained she initiated an appeal and Holland Falling is requesting records be faxed to [phone]. Appeal#[id number]  Expected Discharge Plan: [place] Barriers to Discharge: Continued Medical Work up,Insurance Authorization  Expected Discharge Plan and Services Expected Discharge Plan: [place] In-house Referral: Clinical Social Work   Post Acute Care Choice: [place] Living arrangements for the past 2 months: Single Family Home Expected Discharge Date: 08/27/20                                     Social Determinants of Health (SDOH) Interventions    Readmission Risk Interventions Readmission Risk Prevention Plan 08/19/2020  Medication Screening Complete  Transportation Screening Complete  Some recent data might be hidden

## 2020-09-02 DIAGNOSIS — R7401 Elevation of levels of liver transaminase levels: Secondary | ICD-10-CM | POA: Diagnosis not present

## 2020-09-02 DIAGNOSIS — E876 Hypokalemia: Secondary | ICD-10-CM | POA: Diagnosis not present

## 2020-09-02 DIAGNOSIS — R778 Other specified abnormalities of plasma proteins: Secondary | ICD-10-CM | POA: Diagnosis not present

## 2020-09-02 DIAGNOSIS — N2889 Other specified disorders of kidney and ureter: Secondary | ICD-10-CM | POA: Diagnosis not present

## 2020-09-02 MED ORDER — BUPROPION HCL ER (SR) 100 MG PO TB12
200.0000 mg | ORAL_TABLET | Freq: Every day | ORAL | Status: DC
  Administered 2020-09-03 – 2020-09-05 (×3): 200 mg via ORAL
  Filled 2020-09-02 (×3): qty 2

## 2020-09-02 NOTE — Progress Notes (Signed)
PROGRESS NOTE    LAVAUGHN COURTADE   [id number]  DOB: 1950-05-05  DOA: 08/17/2020 PCP: Alfonse Flavors, MD   Brief Narrative:   KEMORA WILHOIT is a 70 year old female with past medical history of gastric bypass on 07/19/2018 hypertension and chronic back pain presented to the hospital after episode of confusion.  Patient was noted to have left parietal subacute infarct and was admitted to hospital for further evaluation and treatment.   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  Initial CT scan of the head showed hypoattenuation of the left temporoparietal area.  MRI was unable to be obtained due to spinal stimulator.  CT venogram showed some leptomeningeal contrast over the area of the abnormal density consistent with subacute infarct.  2D echocardiogram did not show any evidence of thrombus.  Neurology was consulted and recommended aspirin and Plavix for 3 weeks followed by Plavix alone.  Patient continued to have a headache so CT head scan was done  which showed a small area of hemorrhage.  Neurology has seen the patient and recommended to discontinue Plavix. Ptient will need a loop recorder as outpatient.  Continue speech therapy, statins.  Will need continued speech therapy for expressive aphasia.  Electrophysiology plans to do loop as outpatient but will need Zio patch on day of discharge.  Neurology has notified cardiology about Zio patch.  Essential hypertension Currently on amlodipine and losartan.  Patient was on Catapres and HCTZ at home.  Blood pressure seems to be stable at this time.  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 -s/p 2021 venting gastrostomy tube (now removed) - status post laparoscopic distal gastrectomy 3/29 status post incisional hernia repair on 4/4.  GI was consulted while the patient was at [facility] and prefer not to do work-up in the acute neurological state.  On aspirin and Plavix which will need to be continued.  Hemoglobin has however remained stable since admission.  Hem Occult repeated on 08/27/20 was negative.  Latest hemoglobin of 10.7 at this time.  Anterior abdominal wall wound from prior PEG tube In the process of healing.  Tylenol as needed for pain.  Anxiety-  Patient stated that her husband had passed away recently on 04-26-20.  Zoloft has been resumed and increased to 50 mg as Wellbutrin has been resumed half dose.  We will gradually need to wean Wellbutrin since it has higher risk of seizures.  Further decrease the dose to 200 mg daily from 300 mg/day.  Acute metabolic encephalopathy-  Improved at this time.  As per the family, patient has had some memory issues starting 2012.  Obesity Body mass index is 35.51 kg/m. Status post gastric bypass.  Left renal mass CT abd/pelvis > Small left renal cortical mass, posteromedial midpole, which is larger than prior exam.  Possibility of a small renal neoplasm.  Recommended CT scan of the abdomen with and without contrast in 3 to 6 months since the patient is unable to take MRI due to spinal stimulator.    H/o DVT with PE   Headache with some sinus tenderness.   Has microhemorrhage in the brain on  the CT head scan.  Neurology on board.  Hold Plavix for now.  DVT prophylaxis: SCDs Start: 08/18/20 0050   Code Status:  Full code  Family Communication:  Spoke with the patient's daughter and updated her about the clinical condition of the patient. Spoke regarding possible disposition.  Level of Care:  Med-Surg   Disposition Plan:  Status is: Inpatient  Remains inpatient appropriate because:Unsafe d/c plan- PT  recommends SNF  Dispo: The patient is from: Home              Anticipated d/c is to: Undetermined, insurance has denied skilled nursing facility placement.  Family is going to appeal discharge.             Patient currently is medically stable to d/c.     Difficult to place patient Yes   Consultants:  Neurology EP cardiology  Procedures:  None  Antimicrobials:  None  Anti-infectives (From admission, onward)   Start     Dose/Rate Route Frequency Ordered Stop   08/17/20 2130  cefTRIAXone (ROCEPHIN) 1 g in sodium chloride 0.9 % 100 mL IVPB  Status:  Discontinued        1 g 200 mL/hr over 30 Minutes Intravenous Every 24 hours 08/17/20 2115 08/19/20 0955     Subjective: Today, patient was seen and examined at bedside.  Complains of mild headache worried about being by herself at home.  Has history of falls of prior to stroke.  Objective: Vitals:   09/01/20 2350 09/02/20 0343 09/02/20 0738 09/02/20 1129  BP: (!) 100/58 (!) 121/52 (!) 108/51 (!) 119/44  Pulse: (!) 53 (!) 54 (!) 47 64  Resp: 16 17 16 16   Temp: (!) 97.4 F (36.3 C) (!) 97.5 F (36.4 C) 97.9 F (36.6 C) (!) 97.5 F (36.4 C)  TempSrc: Oral Oral Oral Oral  SpO2: 100% 98% 98% 98%  Weight:      Height:        Intake/Output Summary (Last 24 hours) at 09/02/2020 1409 Last data filed at 09/02/2020 0600 Gross per 24 hour  Intake 360 ml  Output --  Net 360 ml   Filed Weights   08/17/20 1352 08/19/20 0328  Weight: 97.5 kg 99.8 kg   Body mass index is 35.51 kg/m.   Physical examination:  General: Obese built, not in obvious distress HENT:   No scleral pallor or icterus noted. Oral mucosa is moist.  Left sinus tenderness on palpation Chest:  Clear breath sounds.  Diminished breath sounds bilaterally. No crackles or wheezes.  CVS: S1 &S2 heard. No murmur.  Regular rate and rhythm. Abdomen: Soft, nontender, nondistended.  Bowel sounds are heard.   Extremities: No cyanosis, clubbing or edema.  Peripheral pulses  are palpable. Psych: Alert, awake and oriented, mildly anxious CNS:  .  Mild right upper extremity weakness expressive aphasia Skin: Warm and dry.  No rashes noted.  Data Reviewed: I have personally reviewed following labs and imaging studies  CBC: Recent Labs  Lab 08/28/20 0823 08/31/20 0330  WBC 5.1 4.9  HGB 11.8* 10.7*  HCT 36.2 33.1*  MCV 90.3 91.7  PLT 256 332   Basic Metabolic Panel: Recent Labs  Lab 09/01/20 0424  NA 136  K 4.2  CL 105  CO2 27  GLUCOSE 116*  BUN 27*  CREATININE 1.08*  CALCIUM 9.2  MG 2.2   GFR: Estimated Creatinine Clearance: 58.6 mL/min (A) (by C-G formula based on SCr of 1.08 mg/dL (H)). Liver Function Tests: No results  for input(s): AST, ALT, ALKPHOS, BILITOT, PROT, ALBUMIN in the last 168 hours. No results for input(s): LIPASE, AMYLASE in the last 168 hours. No results for input(s): AMMONIA in the last 168 hours. Coagulation Profile: No results for input(s): INR, PROTIME in the last 168 hours. Cardiac Enzymes: No results for input(s): CKTOTAL, CKMB, CKMBINDEX, TROPONINI in the last 168 hours. BNP (last 3 results) No results for input(s): PROBNP in the last 8760 hours. HbA1C: No results for input(s): HGBA1C in the last 72 hours. CBG: Recent Labs  Lab 08/26/20 2351 08/27/20 0624  GLUCAP 115* 89   Lipid Profile: No results for input(s): CHOL, HDL, LDLCALC, TRIG, CHOLHDL, LDLDIRECT in the last 72 hours. Thyroid Function Tests: No results for input(s): TSH, T4TOTAL, FREET4, T3FREE, THYROIDAB in the last 72 hours. Anemia Panel: No results for input(s): VITAMINB12, FOLATE, FERRITIN, TIBC, IRON, RETICCTPCT in the last 72 hours. Urine analysis:    Component Value Date/Time   COLORURINE YELLOW 08/17/2020 2100   APPEARANCEUR CLEAR 08/17/2020 2100   LABSPEC 1.042 (H) 08/17/2020 2100   PHURINE 6.0 08/17/2020 2100   GLUCOSEU NEGATIVE 08/17/2020 2100   HGBUR SMALL (A) 08/17/2020 2100   [place] NEGATIVE 08/17/2020 2100   [place]  NEGATIVE 08/17/2020 2100   PROTEINUR NEGATIVE 08/17/2020 2100   NITRITE NEGATIVE 08/17/2020 2100   LEUKOCYTESUR NEGATIVE 08/17/2020 2100   Sepsis Labs: @LABRCNTIP (procalcitonin:4,lacticidven:4) ) Recent Results (from the past 240 hour(s))  Resp Panel by RT-PCR (Flu A&B, Covid) Nasopharyngeal Swab     Status: None   Collection Time: 08/28/20 10:16 AM   Specimen: Nasopharyngeal Swab; Nasopharyngeal(NP) swabs in vial transport medium  Result Value Ref Range Status   SARS Coronavirus 2 by RT PCR NEGATIVE NEGATIVE Final    Comment: (NOTE) SARS-CoV-2 target nucleic acids are NOT DETECTED.  The SARS-CoV-2 RNA is generally detectable in upper respiratory specimens during the acute phase of infection. The lowest concentration of SARS-CoV-2 viral copies this assay can detect is 138 copies/mL. A negative result does not preclude SARS-Cov-2 infection and should not be used as the sole basis for treatment or other patient management decisions. A negative result may occur with  improper specimen collection/handling, submission of specimen other than nasopharyngeal swab, presence of viral mutation(s) within the areas targeted by this assay, and inadequate number of viral copies(<138 copies/mL). A negative result must be combined with clinical observations, patient history, and epidemiological information. The expected result is Negative.  Fact Sheet for Patients:  EntrepreneurPulse.com.au  Fact Sheet for Healthcare Providers:  IncredibleEmployment.be  This test is no t yet approved or cleared by the [place] FDA and  has been authorized for detection and/or diagnosis of SARS-CoV-2 by FDA under an Emergency Use Authorization (EUA). This EUA will remain  in effect (meaning this test can be used) for the duration of the COVID-19 declaration under Section 564(b)(1) of the Act, 21 U.S.C.section 360bbb-3(b)(1), unless the authorization is terminated  or  revoked sooner.       Influenza A by PCR NEGATIVE NEGATIVE Final   Influenza B by PCR NEGATIVE NEGATIVE Final    Comment: (NOTE) The Xpert Xpress SARS-CoV-2/FLU/RSV plus assay is intended as an aid in the diagnosis of influenza from Nasopharyngeal swab specimens and should not be used as a sole basis for treatment. Nasal washings and aspirates are unacceptable for Xpert Xpress SARS-CoV-2/FLU/RSV testing.  Fact Sheet for Patients: EntrepreneurPulse.com.au  Fact Sheet for Healthcare Providers: IncredibleEmployment.be  This test is not yet approved or cleared by the [place] and  has been authorized for detection and/or diagnosis of SARS-CoV-2 by FDA under an Emergency Use Authorization (EUA). This EUA will remain in effect (meaning this test can be used) for the duration of the COVID-19 declaration under Section 564(b)(1) of the Act, 21 U.S.C. section 360bbb-3(b)(1), unless the authorization is terminated or revoked.  Performed at [facility], [place] [address], [place], [place] [address]      Radiology Studies: CT HEAD WO CONTRAST  Result Date: 09/01/2020 CLINICAL DATA:  Stroke, follow-up. EXAM: CT HEAD WITHOUT CONTRAST TECHNIQUE: Contiguous axial images were obtained from the base of the skull through the vertex without intravenous contrast. COMPARISON:  Prior head CT examinations 08/31/2020 and earlier. FINDINGS: Brain: Mild cerebral and cerebellar atrophy. Unchanged extent of abnormal hypoattenuation within the left parietal lobe. This is again favored to reflect a subacute infarct. Unchanged small foci of acute hemorrhage at this site, measuring up to 6 mm (series 5, image 46) (series 2, image 13). No new sites of hemorrhage are identified. As before, there is mild local mass effect. No ventricular effacement or midline shift. Vascular: No hyperdense vessel.  Atherosclerotic calcifications. Skull: Normal. Negative for fracture or  focal lesion. Sinuses/Orbits: Visualized orbits show no acute finding. Mild left frontal and bilateral ethmoid sinus mucosal thickening. Mild mucosal thickening and small volume frothy secretions within the right maxillary sinus. Mild left maxillary sinus mucosal thickening. IMPRESSION: No significant interval change as compared to the head CT of 08/31/2020. Unchanged extent of abnormal hypoattenuation within the left parietal lobe, likely reflecting a subacute infarct. Unchanged small foci of acute hemorrhage at this site. Stable mild generalized parenchymal atrophy. Paranasal sinus disease, as described. Electronically Signed   By: Kellie Simmering DO   On: 09/01/2020 15:24   CT HEAD WO CONTRAST  Result Date: 08/31/2020 CLINICAL DATA:  Worsening headaches EXAM: CT HEAD WITHOUT CONTRAST TECHNIQUE: Contiguous axial images were obtained from the base of the skull through the vertex without intravenous contrast. COMPARISON:  08/20/2020, 08/17/2020 FINDINGS: Brain: There is a geographic area of decreased attenuation identified in the left posterior parietal lobe consistent with the previously diagnosed subacute infarct. No new infarct is seen. A small focus of increased attenuation is noted within the parenchyma consistent with a small focus of hemorrhage. Vascular: No hyperdense vessel or unexpected calcification. Skull: Normal. Negative for fracture or focal lesion. Sinuses/Orbits: No acute finding. Other: None. IMPRESSION: Changes consistent with the known left posterior parietal subacute infarct. There is however a new focus of increased attenuation best seen on image number 20 of series 3 consistent with a small focus of hemorrhage. These results will be called to the ordering clinician or representative by the Radiologist Assistant, and communication documented in the PACS or [employer]. Electronically Signed   By: Inez Catalina M.D.   On: 08/31/2020 22:07     Scheduled Meds: . amLODipine  5 mg Oral  Daily  . aspirin  81 mg Oral Daily  . atorvastatin  40 mg Oral Daily  . buPROPion  300 mg Oral Daily  . carbamide peroxide  5 drop Both EARS BID  . chlorpheniramine-HYDROcodone  5 mL Oral QHS  . guaiFENesin  600 mg Oral BID  . losartan  25 mg Oral Daily  . melatonin  3 mg Oral QHS  . neomycin-polymyxin-hydrocortisone  3 drop Left EAR Q6H  . oxymetazoline  1 spray Each Nare BID  . pantoprazole  40 mg Oral QHS  . polyethylene glycol  17 g Oral Daily  .  pregabalin  200 mg Oral BID  . sertraline  50 mg Oral Daily   Continuous Infusions:   LOS: 16 days    Flora Lipps, MD [facility] 09/02/2020, 2:09 PM

## 2020-09-02 NOTE — Progress Notes (Signed)
Physical Therapy Treatment Patient Details Name: Morgan Dixon MRN: [id number] DOB: 04-22-1950 Today's Date: 09/02/2020    History of Present Illness Pt is 70 yo female who presented to hospital with episode of confusion on 08/17/20.  She was admitted with L parietal subacute infarct.  CT on 08/31/20 showed new small hemorrhage - CT on 09/01/20 was stable.  Pt additionally found to have heme positive stool but hgb remained stable - no further workup.  Pt found to have new L renal mass. She has PMH of gastric bypass on 07/19/2018, HTN, and back pain with spinal cord stimulator.  Pt recently lost her husband to cancer (January 2022)    PT Comments    Pt continues to make steady progress with mobility. Reports fatigue with extended activity after 15 day hospital stay. Remains very anxious about return home alone.    Follow Up Recommendations  SNF (If denied then maximize [facility] services)     Equipment Recommendations  None recommended by PT    Recommendations for Other Services       Precautions / Restrictions Restrictions Weight Bearing Restrictions: No    Mobility  Bed Mobility Overal bed mobility: Modified Independent Bed Mobility: Supine to Sit     Supine to sit: Modified independent (Device/Increase time)          Transfers Overall transfer level: Needs assistance Equipment used: 4-wheeled walker Transfers: Sit to/from Stand Sit to Stand: Supervision         General transfer comment: Verbal cues for brake management of rollator  Ambulation/Gait Ambulation/Gait assistance: Supervision;Min guard Gait Distance (Feet): 250 Feet Assistive device: 4-wheeled walker;None Gait Pattern/deviations: Step-through pattern;Decreased stride length Gait velocity: decreased Gait velocity interpretation: 1.31 - 2.62 ft/sec, indicative of limited community ambulator General Gait Details: Supervision when using rollator in hallway. Pt fatigues and requires sitting rest break. In room  amb short distances with no assistive device with min guard. Pt with hesistant, tentative gait without assistive device.   Stairs   Stairs assistance: Min guard Stair Management: Two rails;Step to pattern;Forwards Number of Stairs: 4 General stair comments: Assist for safety   Wheelchair Mobility    Modified Rankin (Stroke Patients Only) Modified Rankin (Stroke Patients Only) Pre-Morbid Rankin Score: No symptoms Modified Rankin: Moderate disability     Balance Overall balance assessment: Needs assistance Sitting-balance support: No upper extremity supported;Feet supported Sitting balance-Leahy Scale: Good     Standing balance support: No upper extremity supported;During functional activity Standing balance-Leahy Scale: Fair                 High Level Balance Comments: Worked on rhomberg and single leg stance with support of counter.            Cognition Arousal/Alertness: Awake/alert Behavior During Therapy: Anxious Overall Cognitive Status: Within Functional Limits for tasks assessed                                        Exercises      General Comments        Pertinent Vitals/Pain Pain Assessment: No/denies pain    Home Living                      Prior Function            PT Goals (current goals can now be found in the care plan section) Acute Rehab PT  Goals Patient Stated Goal: hoping to go to rehab PT Goal Formulation: With patient Time For Goal Achievement: 09/16/20 Potential to Achieve Goals: Good Progress towards PT goals: Goals met and updated - see care plan    Frequency    Min 4X/week      PT Plan Current plan remains appropriate    Co-evaluation              AM-PAC PT "6 Clicks" Mobility   Outcome Measure  Help needed turning from your back to your side while in a flat bed without using bedrails?: None Help needed moving from lying on your back to sitting on the side of a flat bed  without using bedrails?: None Help needed moving to and from a bed to a chair (including a wheelchair)?: None Help needed standing up from a chair using your arms (e.g., wheelchair or bedside chair)?: A Little Help needed to walk in hospital room?: A Little Help needed climbing 3-5 steps with a railing? : A Little 6 Click Score: 21    End of Session Equipment Utilized During Treatment: Gait belt Activity Tolerance: Patient tolerated treatment well Patient left: in chair;with call bell/phone within reach Nurse Communication: Mobility status PT Visit Diagnosis: Unsteadiness on feet (R26.81);Other abnormalities of gait and mobility (R26.89);Muscle weakness (generalized) (M62.81)     Time: 0094-1791 PT Time Calculation (min) (ACUTE ONLY): 27 min  Charges:  $Gait Training: 8-22 mins $Therapeutic Activity: 8-22 mins                     [place] Pager [phone] Office [place] 09/02/2020, 11:59 AM

## 2020-09-02 NOTE — Progress Notes (Signed)
  Speech Language Pathology Treatment: Cognitive-Linquistic  Patient Details Name: Morgan Dixon MRN: [id number] DOB: April 28, 1950 Today's Date: 09/02/2020 Time: 1030-1101 SLP Time Calculation (min) (ACUTE ONLY): 31 min  Assessment / Plan / Recommendation Clinical Impression  Pt seen for skilled treatment focusing primarily on expressive aphasia as pt having increased frustrationn/anxiety about the inability to speak in phrases-sentences to make wants/needs known. Graphic expression attempted with words written similar pattern to speaking.   SLP utilized compensatory strategies of sentence completion, phonemic/semantic cues, slow rate, repetition, and visual prompts (ie: visualize word in your mind) to increase words-phrase completion to 70% during structured speaking tasks/simple conversation and sentence completion.  Naming improved today with divergent memory task obtaining 65% accuracy improved form last session using above mentioned strategies and confrontational naming 75% accurate. Pt is very anxious about returning home and inability to function as before, as well as increased anxiety since the recent loss of her husband.  Discussed problem solving strategies with anxiety management and functional tasks at home. Pt required min cues to maintain attention to tasks at hand, although internal distractions may be impeding progress; ST will continue to f/u while in acute setting.   HPI HPI: Pt is 70 yo female who presented to hospital with episode of confusion on 08/17/20.  She was admitted with L parietal subacute infarct.  CT on 08/31/20 showed new small hemorrhage - CT on 09/01/20 was stable.  Pt additionally found to have heme positive stool but hgb remained stable - no further workup.  Pt found to have new L renal mass. She has PMH of gastric bypass on 07/19/2018, HTN, and back pain with spinal cord stimulator.  Pt recently lost her husband to cancer (January 2022)      SLP Plan  Continue with  current plan of care       Recommendations   ST continue while in acute setting; TBD                 Follow up Recommendations: Other (comment) (TBD) SLP Visit Diagnosis: Aphasia (R47.01) Plan: Continue with current plan of care                       [name], M.S., CCC-SLP 09/02/2020, 2:11 PM

## 2020-09-02 NOTE — TOC Progression Note (Signed)
Transition of Care [facility]) - Progression Note    Patient Details  Name: Morgan Dixon MRN: [id number] Date of Birth: 03/17/1951  Transition of Care [facility]) CM/SW [place], [place] Phone Number: 09/02/2020, 10:18 AM  Clinical Narrative:     Pt daughter called and left message requesting to confirm if clinicals for appeal were faxed. CSW called back and updated daughter that clinicals were faxed. Daughter explained that pt was upset that she was being told that she was discharging in 72 hours if appeal is denied. Daughter explains that pt cannot return home home alone and is unsafe. CSW explained that goal is for pt to go to SNF but if appeal is denied, an alternative plan would need to be arranged. CSW discussed possible options such as ALF, family taking care of pt, or hiring private care givers. Daughter explained that family has an alternative plan but is not willing to disclose that at this time as she does not want the alternative plan to effect efforts for SNF placement. CSW explained that having an alternative plan would not affect SNF efforts and that appeal would be last step and CSW/TOC staff would need to be included in alternative plans in case arrangements such as DME and HH need to be made. Daughter explains she will discuss alternative plans after results of appeal. TOC will continue to follow.   Expected Discharge Plan: [place] Barriers to Discharge: Continued Medical Work up,Insurance Authorization  Expected Discharge Plan and Services Expected Discharge Plan: [place] In-house Referral: Clinical Social Work   Post Acute Care Choice: [place] Living arrangements for the past 2 months: Single Family Home Expected Discharge Date: 08/27/20                                     Social Determinants of Health (SDOH) Interventions    Readmission Risk Interventions Readmission Risk Prevention Plan 08/19/2020   Medication Screening Complete  Transportation Screening Complete  Some recent data might be hidden

## 2020-09-02 NOTE — TOC Progression Note (Signed)
Transition of Care [facility]) - Progression Note    Patient Details  Name: Morgan Dixon MRN: [id number] Date of Birth: June 04, 1950  Transition of Care [facility]) CM/SW [place], [place] Phone Number: 09/02/2020, 9:30 AM  Clinical Narrative:     CSW faxed additional clinicals (PT, neuro notes) for [employer].   Expected Discharge Plan: [place] Barriers to Discharge: Continued Medical Work up,Insurance Authorization  Expected Discharge Plan and Services Expected Discharge Plan: [place] In-house Referral: Clinical Social Work   Post Acute Care Choice: [place] Living arrangements for the past 2 months: Single Family Home Expected Discharge Date: 08/27/20                                     Social Determinants of Health (SDOH) Interventions    Readmission Risk Interventions Readmission Risk Prevention Plan 08/19/2020  Medication Screening Complete  Transportation Screening Complete  Some recent data might be hidden

## 2020-09-03 DIAGNOSIS — E876 Hypokalemia: Secondary | ICD-10-CM | POA: Diagnosis not present

## 2020-09-03 DIAGNOSIS — R7401 Elevation of levels of liver transaminase levels: Secondary | ICD-10-CM | POA: Diagnosis not present

## 2020-09-03 DIAGNOSIS — N2889 Other specified disorders of kidney and ureter: Secondary | ICD-10-CM | POA: Diagnosis not present

## 2020-09-03 DIAGNOSIS — R778 Other specified abnormalities of plasma proteins: Secondary | ICD-10-CM | POA: Diagnosis not present

## 2020-09-03 LAB — BASIC METABOLIC PANEL
Anion gap: 7 (ref 5–15)
BUN: 25 mg/dL — ABNORMAL HIGH (ref 8–23)
CO2: 23 mmol/L (ref 22–32)
Calcium: 8.9 mg/dL (ref 8.9–10.3)
Chloride: 107 mmol/L (ref 98–111)
Creatinine, Ser: 0.79 mg/dL (ref 0.44–1.00)
GFR, Estimated: >60 mL/min (ref >60)
Glucose, Bld: 102 mg/dL — ABNORMAL HIGH (ref 70–99)
Potassium: 4.2 mmol/L (ref 3.5–5.1)
Sodium: 137 mmol/L (ref 135–145)

## 2020-09-03 LAB — CBC
HCT: 29.8 % — ABNORMAL LOW (ref 36.0–46.0)
Hemoglobin: 9.6 g/dL — ABNORMAL LOW (ref 12.0–15.0)
MCH: 29.4 pg (ref 26.0–34.0)
MCHC: 32.2 g/dL (ref 30.0–36.0)
MCV: 91.4 fL (ref 80.0–100.0)
Platelets: 208 10*3/uL (ref 150–400)
RBC: 3.26 MIL/uL — ABNORMAL LOW (ref 3.87–5.11)
RDW: 16.7 % — ABNORMAL HIGH (ref 11.5–15.5)
WBC: 4.3 10*3/uL (ref 4.0–10.5)
nRBC: 0 % (ref 0.0–0.2)

## 2020-09-03 LAB — PHOSPHORUS: Phosphorus: 3.6 mg/dL (ref 2.5–4.6)

## 2020-09-03 LAB — MAGNESIUM: Magnesium: 2.1 mg/dL (ref 1.7–2.4)

## 2020-09-03 MED ORDER — TOPIRAMATE 25 MG PO TABS
25.0000 mg | ORAL_TABLET | Freq: Two times a day (BID) | ORAL | Status: DC
  Administered 2020-09-03 – 2020-09-05 (×5): 25 mg via ORAL
  Filled 2020-09-03 (×5): qty 1

## 2020-09-03 NOTE — Progress Notes (Signed)
PROGRESS NOTE    Morgan Dixon   TIR:[id number]  DOB: 1951/03/07  DOA: 08/17/2020 PCP: Alfonse Flavors, MD   Brief Narrative:   Morgan Dixon is a 70 year old female with past medical history of gastric bypass on 07/19/2018 hypertension and chronic back pain presented to the hospital after episode of confusion.  Patient was noted to have left parietal subacute infarct and was admitted to hospital for further evaluation and treatment.   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  Initial CT scan of the head showed hypoattenuation of the left temporoparietal area.  MRI was unable to be obtained due to spinal stimulator.  CT venogram showed some leptomeningeal contrast over the area of the abnormal density consistent with subacute infarct.  2D echocardiogram did not show any evidence of thrombus.  Neurology was consulted and recommended aspirin and Plavix for 3 weeks followed by Plavix alone.  Patient continued to have a headache so CT head scan was done  which showed a small area of hemorrhage.  Neurology has seen the patient and recommended to discontinue Plavix. Ptient will need a loop recorder as outpatient.  Continue speech therapy, statins.  Will need continued speech therapy for expressive aphasia.  Electrophysiology plans to do loop as outpatient but will need Zio patch on day of discharge.  Neurology has notified cardiology about Zio patch.  Headache.  Left-sided and constant as per the patient.  Spoke with Dr. Leonie Man neurology.  Recommended to start Topamax 25 mg twice daily for now.  Essential hypertension Currently on amlodipine and losartan.  Patient was on Catapres and HCTZ at home.  Blood pressure seems to be stable at this time.  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 -s/p 2021 venting gastrostomy tube (now removed) - status post laparoscopic distal gastrectomy 3/29 status post incisional hernia repair on 4/4.  GI was consulted while the patient was at [facility] and prefer not to do work-up in the acute neurological state.  On aspirin and Plavix which will need to be continued.  Hemoglobin has however remained stable since admission.  Hem Occult repeated on 08/27/20 was negative.  Latest hemoglobin of 9.6 at this time.  Anterior abdominal wall wound from prior PEG tube In the process of healing.  Tylenol as needed for pain.  Anxiety-  Patient stated that her husband had passed away recently on 2020-04-30.  Zoloft has been resumed and increased to 50 mg as Wellbutrin has been resumed half dose.  We will gradually need to wean Wellbutrin since it has higher risk of seizures.  Decreased the dose to 200 mg daily from 300 mg/day on 09/02/2020.  Acute metabolic encephalopathy-  Improved at this time.  As per the family, patient has had some memory issues starting 2012.  Obesity Body mass index is 35.51 kg/m. Status post gastric bypass.  Left renal mass CT abd/pelvis > Small left renal cortical mass, posteromedial midpole, which is larger than prior exam.  Possibility of a small renal neoplasm.  Recommended CT scan of the abdomen with and without contrast in 3 to 6 months since the patient is unable to  take MRI due to spinal stimulator.    H/o DVT with PE   Headache with some sinus tenderness.   Has microhemorrhage in the brain on the CT head scan.  Neurology on board.  Hold Plavix for now.  DVT prophylaxis: SCDs Start: 08/18/20 0050   Code Status:  Full code  Family Communication:  None today.  Spoke with the patient's daughter and updated her about the clinical condition of the patient  yesterday  Level of Care:  Med-Surg   Disposition Plan:  Status is: Inpatient  Remains inpatient appropriate because:Unsafe d/c plan- PT recommends SNF  Dispo: The patient is from: Home              Anticipated d/c is to: Undetermined, insurance has denied skilled nursing facility placement.  Family is going to appeal discharge.             Patient currently is medically stable to d/c.     Difficult to place patient Yes   Consultants:  Neurology EP cardiology  Procedures:  None  Antimicrobials:  None  Anti-infectives (From admission, onward)   Start     Dose/Rate Route Frequency Ordered Stop   08/17/20 2130  cefTRIAXone (ROCEPHIN) 1 g in sodium chloride 0.9 % 100 mL IVPB  Status:  Discontinued        1 g 200 mL/hr over 30 Minutes Intravenous Every 24 hours 08/17/20 2115 08/19/20 0955     Subjective: Today, patient still complains of a headache mostly on the left side which has been constant without any nausea or vomiting.  Feels mildly anxious.   Objective: Vitals:   09/03/20 0645 09/03/20 0653 09/03/20 0735 09/03/20 1154  BP: (!) 105/58 (!) 105/58 125/71 (!) 133/57  Pulse: (!) 57 (!) 57 (!) 56 62  Resp:  17 16 17   Temp: 97.7 F (36.5 C) 97.7 F (36.5 C) 97.7 F (36.5 C) 98.3 F (36.8 C)  TempSrc: Oral Oral Oral Oral  SpO2: 100% 100% 100% 97%  Weight:      Height:        Intake/Output Summary (Last 24 hours) at 09/03/2020 1329 Last data filed at 09/02/2020 1838 Gross per 24 hour  Intake 240 ml  Output --  Net 240 ml   Filed Weights   08/17/20 1352 08/19/20 0328  Weight: 97.5 kg 99.8 kg   Body mass index is 35.51 kg/m.   Physical examination:  General: Obese built, not in obvious distress HENT:   No scleral pallor or icterus noted. Oral mucosa is moist..  Tenderness on palpation.  Upper dentures noted.  No ulceration noted.  Chest:  Clear breath sounds.  Diminished breath sounds bilaterally. No crackles or wheezes.  CVS: S1 &S2 heard. No murmur.   Regular rate and rhythm. Abdomen: Soft, nontender, nondistended.  Bowel sounds are heard.   Extremities: No cyanosis, clubbing or edema.  Peripheral pulses are palpable. Psych: Alert, awake and oriented, mildly anxious CNS:  .  Mild right upper extremity weakness with improving expressive aphasia Skin: Warm and dry.  No rashes noted.  Data Reviewed: I have personally reviewed following labs and imaging studies  CBC: Recent Labs  Lab 08/28/20 0823 08/31/20 0330 09/03/20 0041  WBC 5.1 4.9 4.3  HGB 11.8* 10.7* 9.6*  HCT 36.2 33.1* 29.8*  MCV 90.3 91.7 91.4  PLT 256 227 295   Basic Metabolic Panel: Recent Labs  Lab 09/01/20 0424 09/03/20 0041  NA 136 137  K 4.2 4.2  CL 105 107  CO2 27 23  GLUCOSE 116* 102*  BUN 27* 25*  CREATININE 1.08* 0.79  CALCIUM 9.2 8.9  MG 2.2 2.1  PHOS  --  3.6   GFR: Estimated Creatinine Clearance: 79.1 mL/min (by C-G formula based on SCr of 0.79 mg/dL). Liver Function Tests: No results for input(s): AST, ALT, ALKPHOS, BILITOT, PROT, ALBUMIN in the last 168 hours. No results for input(s): LIPASE, AMYLASE in the last 168 hours. No results for input(s): AMMONIA in the last 168 hours. Coagulation Profile: No results for input(s): INR, PROTIME in the last 168 hours. Cardiac Enzymes: No results for input(s): CKTOTAL, CKMB, CKMBINDEX, TROPONINI in the last 168 hours. BNP (last 3 results) No results for input(s): PROBNP in the last 8760 hours. HbA1C: No results for input(s): HGBA1C in the last 72 hours. CBG: No results for input(s): GLUCAP in the last 168 hours. Lipid Profile: No results for input(s): CHOL, HDL, LDLCALC, TRIG, CHOLHDL, LDLDIRECT in the last 72 hours. Thyroid Function Tests: No results for input(s): TSH, T4TOTAL, FREET4, T3FREE, THYROIDAB in the last 72 hours. Anemia Panel: No results for input(s): VITAMINB12, FOLATE, FERRITIN, TIBC, IRON, RETICCTPCT in the last 72 hours. Urine analysis:    Component Value Date/Time    COLORURINE YELLOW 08/17/2020 2100   APPEARANCEUR CLEAR 08/17/2020 2100   LABSPEC 1.042 (H) 08/17/2020 2100   PHURINE 6.0 08/17/2020 2100   GLUCOSEU NEGATIVE 08/17/2020 2100   HGBUR SMALL (A) 08/17/2020 2100   [place] NEGATIVE 08/17/2020 2100   [place] NEGATIVE 08/17/2020 2100   PROTEINUR NEGATIVE 08/17/2020 2100   NITRITE NEGATIVE 08/17/2020 2100   LEUKOCYTESUR NEGATIVE 08/17/2020 2100   Sepsis Labs: @LABRCNTIP (procalcitonin:4,lacticidven:4) ) Recent Results (from the past 240 hour(s))  Resp Panel by RT-PCR (Flu A&B, Covid) Nasopharyngeal Swab     Status: None   Collection Time: 08/28/20 10:16 AM   Specimen: Nasopharyngeal Swab; Nasopharyngeal(NP) swabs in vial transport medium  Result Value Ref Range Status   SARS Coronavirus 2 by RT PCR NEGATIVE NEGATIVE Final    Comment: (NOTE) SARS-CoV-2 target nucleic acids are NOT DETECTED.  The SARS-CoV-2 RNA is generally detectable in upper respiratory specimens during the acute phase of infection. The lowest concentration of SARS-CoV-2 viral copies this assay can detect is 138 copies/mL. A negative result does not preclude SARS-Cov-2 infection and should not be used as the sole basis for treatment or other patient management decisions. A negative result may occur with  improper specimen collection/handling, submission of specimen other than nasopharyngeal swab, presence of viral mutation(s) within the areas targeted by this assay, and inadequate number of viral copies(<138 copies/mL). A negative result must be combined with clinical observations, patient history, and epidemiological information. The expected result is Negative.  Fact Sheet for Patients:  EntrepreneurPulse.com.au  Fact Sheet for Healthcare Providers:  IncredibleEmployment.be  This test is no t yet approved or cleared by the [place] FDA and  has been authorized for detection and/or diagnosis of SARS-CoV-2 by FDA under  an Emergency Use Authorization (EUA). This EUA will remain  in effect (meaning this test can be used) for the duration of the COVID-19 declaration under Section 564(b)(1) of the Act, 21 U.S.C.section 360bbb-3(b)(1), unless the authorization is terminated  or revoked sooner.       Influenza A by PCR NEGATIVE NEGATIVE Final   Influenza B by PCR NEGATIVE NEGATIVE Final    Comment: (NOTE) The Xpert Xpress SARS-CoV-2/FLU/RSV plus assay is intended as an aid in the diagnosis of influenza from Nasopharyngeal swab specimens and should not  be used as a sole basis for treatment. Nasal washings and aspirates are unacceptable for Xpert Xpress SARS-CoV-2/FLU/RSV testing.  Fact Sheet for Patients: EntrepreneurPulse.com.au  Fact Sheet for Healthcare Providers: IncredibleEmployment.be  This test is not yet approved or cleared by the [place] FDA and has been authorized for detection and/or diagnosis of SARS-CoV-2 by FDA under an Emergency Use Authorization (EUA). This EUA will remain in effect (meaning this test can be used) for the duration of the COVID-19 declaration under Section 564(b)(1) of the Act, 21 U.S.C. section 360bbb-3(b)(1), unless the authorization is terminated or revoked.  Performed at [facility], [place] [address] [place], [place] [address]      Radiology Studies: CT HEAD WO CONTRAST  Result Date: 09/01/2020 CLINICAL DATA:  Stroke, follow-up. EXAM: CT HEAD WITHOUT CONTRAST TECHNIQUE: Contiguous axial images were obtained from the base of the skull through the vertex without intravenous contrast. COMPARISON:  Prior head CT examinations 08/31/2020 and earlier. FINDINGS: Brain: Mild cerebral and cerebellar atrophy. Unchanged extent of abnormal hypoattenuation within the left parietal lobe. This is again favored to reflect a subacute infarct. Unchanged small foci of acute hemorrhage at this site, measuring up to 6 mm (series 5, image  46) (series 2, image 13). No new sites of hemorrhage are identified. As before, there is mild local mass effect. No ventricular effacement or midline shift. Vascular: No hyperdense vessel.  Atherosclerotic calcifications. Skull: Normal. Negative for fracture or focal lesion. Sinuses/Orbits: Visualized orbits show no acute finding. Mild left frontal and bilateral ethmoid sinus mucosal thickening. Mild mucosal thickening and small volume frothy secretions within the right maxillary sinus. Mild left maxillary sinus mucosal thickening. IMPRESSION: No significant interval change as compared to the head CT of 08/31/2020. Unchanged extent of abnormal hypoattenuation within the left parietal lobe, likely reflecting a subacute infarct. Unchanged small foci of acute hemorrhage at this site. Stable mild generalized parenchymal atrophy. Paranasal sinus disease, as described. Electronically Signed   By: Kellie Simmering DO   On: 09/01/2020 15:24     Scheduled Meds: . amLODipine  5 mg Oral Daily  . aspirin  81 mg Oral Daily  . atorvastatin  40 mg Oral Daily  . buPROPion  200 mg Oral Daily  . carbamide peroxide  5 drop Both EARS BID  . chlorpheniramine-HYDROcodone  5 mL Oral QHS  . guaiFENesin  600 mg Oral BID  . losartan  25 mg Oral Daily  . melatonin  3 mg Oral QHS  . neomycin-polymyxin-hydrocortisone  3 drop Left EAR Q6H  . pantoprazole  40 mg Oral QHS  . polyethylene glycol  17 g Oral Daily  . pregabalin  200 mg Oral BID  . sertraline  50 mg Oral Daily  . topiramate  25 mg Oral BID   Continuous Infusions:   LOS: 17 days    Flora Lipps, MD [facility] 09/03/2020, 1:29 PM

## 2020-09-04 DIAGNOSIS — R778 Other specified abnormalities of plasma proteins: Secondary | ICD-10-CM | POA: Diagnosis not present

## 2020-09-04 DIAGNOSIS — R7401 Elevation of levels of liver transaminase levels: Secondary | ICD-10-CM | POA: Diagnosis not present

## 2020-09-04 DIAGNOSIS — N2889 Other specified disorders of kidney and ureter: Secondary | ICD-10-CM | POA: Diagnosis not present

## 2020-09-04 DIAGNOSIS — E876 Hypokalemia: Secondary | ICD-10-CM | POA: Diagnosis not present

## 2020-09-04 MED ORDER — ACETAMINOPHEN 325 MG PO TABS
650.0000 mg | ORAL_TABLET | Freq: Four times a day (QID) | ORAL | Status: DC | PRN
  Administered 2020-09-04 – 2020-09-05 (×2): 650 mg via ORAL
  Filled 2020-09-04 (×2): qty 2

## 2020-09-04 MED ORDER — ALPRAZOLAM 0.25 MG PO TABS
0.2500 mg | ORAL_TABLET | Freq: Three times a day (TID) | ORAL | Status: DC | PRN
  Administered 2020-09-04 – 2020-09-05 (×3): 0.25 mg via ORAL
  Filled 2020-09-04 (×3): qty 1

## 2020-09-04 MED ORDER — BUTALBITAL-APAP-CAFFEINE 50-325-40 MG PO TABS
1.0000 | ORAL_TABLET | Freq: Four times a day (QID) | ORAL | Status: DC | PRN
  Administered 2020-09-04 – 2020-09-05 (×3): 1 via ORAL
  Filled 2020-09-04 (×3): qty 1

## 2020-09-04 NOTE — TOC Progression Note (Signed)
Transition of Care [facility]) - Progression Note    Patient Details  Name: Morgan Dixon MRN: [id number] Date of Birth: [birth year]  Transition [facility] Care [facility]) CM/SW [place], [place] Phone Number: 09/04/2020, 1:24 PM  Clinical Narrative:   CSW spoke with [place], there is still no response from [place] on the appeal. Juanita asked questions about what happens if [place] still upholds the denial, and CSW discussed options of going home with home health vs private pay, and daughter discussed possibility of taking her home with her but is concerned about how she'll get to follow up appointments and whether or not she can drive. Juanita asked if patient will ever be able to get back home independent, and CSW discussed how sometimes that takes time to figure out. Juanita to update CSW when she hears back from [facility] on appeal decision. CSW to follow, updated MD about barrier to discharge.    Expected Discharge Plan: [place] Barriers to Discharge: Continued Medical Work up,Insurance Authorization  Expected Discharge Plan and Services Expected Discharge Plan: [place] In-house Referral: Clinical Social Work   Post Acute Care Choice: [place] Living arrangements for the past 2 months: Single Family Home Expected Discharge Date: 08/27/20                                     Social Determinants of Health (SDOH) Interventions    Readmission Risk Interventions Readmission Risk Prevention Plan 08/19/2020  Medication Screening Complete  Transportation Screening Complete  Some recent data might be hidden

## 2020-09-04 NOTE — Progress Notes (Signed)
PROGRESS NOTE    Morgan Dixon   MWU:[id number]  DOB: Oct 04, 1950  DOA: 08/17/2020 PCP: Alfonse Flavors, MD   Brief Narrative:   Morgan Dixon is a 70 year old female with past medical history of gastric bypass on 07/19/2018 hypertension and chronic back pain presented to the hospital after episode of confusion.  Patient was noted to have left parietal subacute infarct and was admitted to hospital for further evaluation and treatment.   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  Initial CT scan of the head showed hypoattenuation of the left temporoparietal area.  MRI was unable to be obtained due to spinal stimulator.  CT venogram showed some leptomeningeal contrast over the area of the abnormal density consistent with subacute infarct.  2D echocardiogram did not show any evidence of thrombus.  Neurology was consulted and recommended aspirin and Plavix for 3 weeks followed by Plavix alone.  Patient continued to have a headache so CT head scan was done  which showed a small area of hemorrhage.  Neurology has seen the patient and recommended to discontinue Plavix. Ptient will need a loop recorder as outpatient.  Continue speech therapy, statins.  Will need continued speech therapy for expressive aphasia.  Electrophysiology plans to do loop as outpatient but will need Zio patch on day of discharge.  Neurology has notified cardiology about Zio patch.  Currently awaiting for rehabilitation.  Headache.  Left-sided and constant as per the patient.  Spoke with Dr. Leonie Man neurology yesterday..  Recommended to start Topamax 25 mg twice daily for now.  Still complains of pain on the left side.  We will add Fioricet.  If not improved by tomorrow we will reconsider  discussing with neurology.  Essential hypertension Currently on amlodipine and losartan.  Patient was on Catapres and HCTZ at home.  Blood pressure seems to be stable at this time.  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 -s/p 2021 venting gastrostomy tube (now removed) - status post laparoscopic distal gastrectomy 3/29 status post incisional hernia repair on 4/4.  GI was consulted while the patient was at [facility] and prefer not to do work-up in the acute neurological state.  On aspirin and Plavix which will need to be continued.  Hemoglobin has however remained stable since admission.  Hem Occult repeated on 08/27/20 was negative.  Latest hemoglobin of 9.6 at this time.  Anterior abdominal wall wound from prior PEG tube In the process of healing.  Tylenol as needed for pain.  Anxiety-  Patient stated that her husband had passed away recently on 04-21-20.  Zoloft has been resumed and increased to 50 mg as Wellbutrin has been resumed half dose.  We will gradually need to wean Wellbutrin since it has higher risk of seizures.  Decreased the dose to 200 mg daily from 300 mg/day on 09/02/2020.  Acute metabolic encephalopathy-  Improved at this time.  As per the family, patient has had some memory issues starting 2012.  Obesity Body mass index is 35.51 kg/m. Status post gastric bypass.  Left renal mass CT abd/pelvis > Small left renal cortical mass, posteromedial midpole, which is larger  than prior exam.  Possibility of a small renal neoplasm.  Recommended CT scan of the abdomen with and without contrast in 3 to 6 months since the patient is unable to take MRI due to spinal stimulator.    H/o DVT with PE   Headache with some sinus tenderness.   Has microhemorrhage in the brain on the CT head scan.  Neurology on board.  Hold Plavix for now.  Topamax has been  initiated.  Add ferritin today.  DVT prophylaxis: SCDs Start: 08/18/20 0050   Code Status:  Full code  Family Communication:  None today.   Level of Care:  Med-Surg   Disposition Plan:  Status is: Inpatient  Remains inpatient appropriate because:Unsafe d/c plan- PT recommends SNF  Dispo: The patient is from: Home              Anticipated d/c is to: Undetermined, insurance has denied skilled nursing facility placement.  Family is going to appeal discharge.             Patient currently is medically stable to d/c.     Difficult to place patient Yes   Consultants:  Neurology EP cardiology  Procedures:  None  Antimicrobials:  None  Anti-infectives (From admission, onward)   Start     Dose/Rate Route Frequency Ordered Stop   08/17/20 2130  cefTRIAXone (ROCEPHIN) 1 g in sodium chloride 0.9 % 100 mL IVPB  Status:  Discontinued        1 g 200 mL/hr over 30 Minutes Intravenous Every 24 hours 08/17/20 2115 08/19/20 0955     Subjective: Today, patient feels anxious about her diagnosis and disposition.  Complains of headache mostly on the left side.  No nausea vomiting fever chills.  Objective: Vitals:   09/03/20 2310 09/04/20 0401 09/04/20 0730 09/04/20 1126  BP: (!) 125/56 (!) 140/56 117/70 (!) 151/64  Pulse: (!) 59 (!) 53 (!) 53 79  Resp: 15 15 19 20   Temp: 97.7 F (36.5 C) 98.1 F (36.7 C) 97.9 F (36.6 C) 98.1 F (36.7 C)  TempSrc: Oral  Oral Oral  SpO2: 97% 99% 96% 97%  Weight:      Height:       No intake or output data in the 24 hours ending 09/04/20 1420 Filed Weights   08/17/20 1352 08/19/20 0328  Weight: 97.5 kg 99.8 kg   Body mass index is 35.51 kg/m.   Physical examination:  General: Obese built, not in obvious distress mildly anxious HENT:   No scleral pallor or icterus noted. Oral mucosa is moist.  Left sinus tenderness on palpation.  Upper dentures Chest:  Clear breath sounds.  Diminished breath sounds bilaterally. No crackles or wheezes.   CVS: S1 &S2 heard. No murmur.  Regular rate and rhythm. Abdomen: Soft, nontender, nondistended.  Bowel sounds are heard.   Extremities: No cyanosis, clubbing or edema.  Peripheral pulses are palpable. Psych: Alert, awake and oriented, anxious CNS:  No cranial nerve deficits.  Mild right upper  extremity weakness, improving aphasia. Skin: Warm and dry.  No rashes noted.  Data Reviewed: I have personally reviewed following labs and imaging studies  CBC: Recent Labs  Lab 08/31/20 0330 09/03/20 0041  WBC 4.9 4.3  HGB 10.7* 9.6*  HCT 33.1* 29.8*  MCV 91.7 91.4  PLT 227 [id number]   Basic Metabolic Panel: Recent Labs  Lab 09/01/20 0424 09/03/20 0041  NA 136 137  K 4.2 4.2  CL 105 107  CO2 27 23  GLUCOSE 116* 102*  BUN 27* 25*  CREATININE 1.08* 0.79  CALCIUM 9.2 8.9  MG 2.2 2.1  PHOS  --  3.6   GFR: Estimated Creatinine Clearance: 79.1 mL/min (by C-G formula based on SCr of 0.79 mg/dL). Liver Function Tests: No results for input(s): AST, ALT, ALKPHOS, BILITOT, PROT, ALBUMIN in the last 168 hours. No results for input(s): LIPASE, AMYLASE in the last 168 hours. No results for input(s): AMMONIA in the last 168 hours. Coagulation Profile: No results for input(s): INR, PROTIME in the last 168 hours. Cardiac Enzymes: No results for input(s): CKTOTAL, CKMB, CKMBINDEX, TROPONINI in the last 168 hours. BNP (last 3 results) No results for input(s): PROBNP in the last 8760 hours. HbA1C: No results for input(s): HGBA1C in the last 72 hours. CBG: No results for input(s): GLUCAP in the last 168 hours. Lipid Profile: No results for input(s): CHOL, HDL, LDLCALC, TRIG, CHOLHDL, LDLDIRECT in the last 72 hours. Thyroid Function Tests: No results for input(s): TSH, T4TOTAL, FREET4, T3FREE, THYROIDAB in the last 72 hours. Anemia Panel: No results for input(s): VITAMINB12, FOLATE, FERRITIN, TIBC, IRON, RETICCTPCT in the last 72 hours. Urine analysis:    Component Value Date/Time    COLORURINE YELLOW 08/17/2020 2100   APPEARANCEUR CLEAR 08/17/2020 2100   LABSPEC 1.042 (H) 08/17/2020 2100   PHURINE 6.0 08/17/2020 2100   GLUCOSEU NEGATIVE 08/17/2020 2100   HGBUR SMALL (A) 08/17/2020 2100   [place] NEGATIVE 08/17/2020 2100   [place] NEGATIVE 08/17/2020 2100   PROTEINUR NEGATIVE 08/17/2020 2100   NITRITE NEGATIVE 08/17/2020 2100   LEUKOCYTESUR NEGATIVE 08/17/2020 2100   Sepsis Labs: @LABRCNTIP (procalcitonin:4,lacticidven:4) ) Recent Results (from the past 240 hour(s))  Resp Panel by RT-PCR (Flu A&B, Covid) Nasopharyngeal Swab     Status: None   Collection Time: 08/28/20 10:16 AM   Specimen: Nasopharyngeal Swab; Nasopharyngeal(NP) swabs in vial transport medium  Result Value Ref Range Status   SARS Coronavirus 2 by RT PCR NEGATIVE NEGATIVE Final    Comment: (NOTE) SARS-CoV-2 target nucleic acids are NOT DETECTED.  The SARS-CoV-2 RNA is generally detectable in upper respiratory specimens during the acute phase of infection. The lowest concentration of SARS-CoV-2 viral copies this assay can detect is 138 copies/mL. A negative result does not preclude SARS-Cov-2 infection and should not be used as the sole basis for treatment or other patient management decisions. A negative result may occur with  improper specimen collection/handling, submission of specimen other than nasopharyngeal swab, presence of viral mutation(s) within the areas targeted by this assay, and inadequate number of viral copies(<138 copies/mL). A negative result must be combined with clinical observations, patient history, and epidemiological information. The expected result is Negative.  Fact Sheet for Patients:  EntrepreneurPulse.com.au  Fact Sheet for Healthcare Providers:  IncredibleEmployment.be  This test is no t yet approved or cleared by the [place] FDA and  has been authorized for detection and/or diagnosis of SARS-CoV-2 by FDA under  an Emergency Use Authorization (EUA). This EUA will remain  in effect (meaning this test can be used) for the duration of the COVID-19 declaration under Section 564(b)(1) of the Act, 21 U.S.C.section 360bbb-3(b)(1), unless the authorization is terminated  or revoked sooner.       Influenza A by PCR NEGATIVE NEGATIVE Final   Influenza B by PCR NEGATIVE NEGATIVE Final    Comment: (NOTE) The Xpert Xpress SARS-CoV-2/FLU/RSV plus assay is intended as an aid in the diagnosis of influenza from Nasopharyngeal swab specimens and should not be used as a  sole basis for treatment. Nasal washings and aspirates are unacceptable for Xpert Xpress SARS-CoV-2/FLU/RSV testing.  Fact Sheet for Patients: EntrepreneurPulse.com.au  Fact Sheet for Healthcare Providers: IncredibleEmployment.be  This test is not yet approved or cleared by the [place] FDA and has been authorized for detection and/or diagnosis of SARS-CoV-2 by FDA under an Emergency Use Authorization (EUA). This EUA will remain in effect (meaning this test can be used) for the duration of the COVID-19 declaration under Section 564(b)(1) of the Act, 21 U.S.C. section 360bbb-3(b)(1), unless the authorization is terminated or revoked.  Performed at [facility], [place] [address] [place], [place] [address]      Radiology Studies: No results found.   Scheduled Meds: . amLODipine  5 mg Oral Daily  . aspirin  81 mg Oral Daily  . atorvastatin  40 mg Oral Daily  . buPROPion  200 mg Oral Daily  . carbamide peroxide  5 drop Both EARS BID  . chlorpheniramine-HYDROcodone  5 mL Oral QHS  . guaiFENesin  600 mg Oral BID  . losartan  25 mg Oral Daily  . melatonin  3 mg Oral QHS  . neomycin-polymyxin-hydrocortisone  3 drop Left EAR Q6H  . pantoprazole  40 mg Oral QHS  . polyethylene glycol  17 g Oral Daily  . pregabalin  200 mg Oral BID  . sertraline  50 mg Oral Daily  . topiramate  25 mg Oral  BID   Continuous Infusions:   LOS: 18 days    Flora Lipps, MD [facility] 09/04/2020, 2:20 PM

## 2020-09-05 ENCOUNTER — Ambulatory Visit (INDEPENDENT_AMBULATORY_CARE_PROVIDER_SITE_OTHER): Payer: Medicare HMO

## 2020-09-05 ENCOUNTER — Ambulatory Visit: Payer: Medicare HMO | Admitting: Dermatology

## 2020-09-05 DIAGNOSIS — I639 Cerebral infarction, unspecified: Secondary | ICD-10-CM | POA: Diagnosis not present

## 2020-09-05 DIAGNOSIS — R7401 Elevation of levels of liver transaminase levels: Secondary | ICD-10-CM | POA: Diagnosis not present

## 2020-09-05 DIAGNOSIS — K922 Gastrointestinal hemorrhage, unspecified: Secondary | ICD-10-CM | POA: Diagnosis not present

## 2020-09-05 DIAGNOSIS — R778 Other specified abnormalities of plasma proteins: Secondary | ICD-10-CM | POA: Diagnosis not present

## 2020-09-05 DIAGNOSIS — I10 Essential (primary) hypertension: Secondary | ICD-10-CM | POA: Diagnosis not present

## 2020-09-05 LAB — CBC
HCT: 31.8 % — ABNORMAL LOW (ref 36.0–46.0)
Hemoglobin: 10.2 g/dL — ABNORMAL LOW (ref 12.0–15.0)
MCH: 29.5 pg (ref 26.0–34.0)
MCHC: 32.1 g/dL (ref 30.0–36.0)
MCV: 91.9 fL (ref 80.0–100.0)
Platelets: 209 10*3/uL (ref 150–400)
RBC: 3.46 MIL/uL — ABNORMAL LOW (ref 3.87–5.11)
RDW: 16.7 % — ABNORMAL HIGH (ref 11.5–15.5)
WBC: 4.9 10*3/uL (ref 4.0–10.5)
nRBC: 0 % (ref 0.0–0.2)

## 2020-09-05 LAB — BASIC METABOLIC PANEL
Anion gap: 5 (ref 5–15)
BUN: 22 mg/dL (ref 8–23)
CO2: 27 mmol/L (ref 22–32)
Calcium: 9.3 mg/dL (ref 8.9–10.3)
Chloride: 106 mmol/L (ref 98–111)
Creatinine, Ser: 0.94 mg/dL (ref 0.44–1.00)
GFR, Estimated: >60 mL/min (ref >60)
Glucose, Bld: 95 mg/dL (ref 70–99)
Potassium: 4.3 mmol/L (ref 3.5–5.1)
Sodium: 138 mmol/L (ref 135–145)

## 2020-09-05 MED ORDER — MELATONIN 3 MG PO TABS: 3.0000 mg | ORAL_TABLET | Freq: Every day | ORAL | 0 refills | Status: DC

## 2020-09-05 MED ORDER — BUPROPION HCL ER (SR) 150 MG PO TB12: 150.0000 mg | ORAL_TABLET | Freq: Every day | ORAL | Status: DC

## 2020-09-05 MED ORDER — BUTALBITAL-APAP-CAFFEINE 50-325-40 MG PO TABS: 1.0000 | ORAL_TABLET | Freq: Four times a day (QID) | ORAL | 0 refills | Status: DC | PRN

## 2020-09-05 MED ORDER — LOSARTAN POTASSIUM 25 MG PO TABS: 25.0000 mg | ORAL_TABLET | Freq: Every day | ORAL | 2 refills | Status: DC

## 2020-09-05 MED ORDER — ASPIRIN 81 MG PO CHEW: 81.0000 mg | CHEWABLE_TABLET | Freq: Every day | ORAL | 0 refills | Status: AC

## 2020-09-05 MED ORDER — AMLODIPINE BESYLATE 5 MG PO TABS: 5.0000 mg | ORAL_TABLET | Freq: Every day | ORAL | 2 refills | Status: DC

## 2020-09-05 MED ORDER — TOPIRAMATE 25 MG PO TABS: 25.0000 mg | ORAL_TABLET | Freq: Two times a day (BID) | ORAL | 2 refills | Status: DC

## 2020-09-05 MED ORDER — ATORVASTATIN CALCIUM 40 MG PO TABS: 40.0000 mg | ORAL_TABLET | Freq: Every day | ORAL | 2 refills | Status: DC

## 2020-09-05 MED ORDER — CLOPIDOGREL BISULFATE 75 MG PO TABS: 75.0000 mg | ORAL_TABLET | Freq: Every day | ORAL | 2 refills | Status: AC

## 2020-09-05 NOTE — Progress Notes (Signed)
Physical Therapy Treatment Patient Details Name: Morgan Dixon MRN: [id number] DOB: [birth year] Today's Date: 09/05/2020    History of Present Illness Pt is 70 y/o female who presented to hospital with episode of confusion on 08/17/20.  She was admitted with L parietal subacute infarct. CT on 08/31/20 showed new small hemorrhage - CT on 09/01/20 was stable. Pt found to have new L renal mass. She has PMH of gastric bypass on 07/19/2018, HTN, and back pain with spinal cord stimulator.  Pt recently lost her husband to cancer (January 2022)    PT Comments    Progressing towards physical therapy goals. Continue to recommend SNF services at d/c to maximize functional independence and safety prior to return home alone. Pt reports daughter is available intermittently as she lives 40 minutes away and works 5 days a week. Recommend max HH services if insurance denies SNF, and 3-in-1 for walk-in shower. Pt anxious about returning home alone, and has many concerns with managing without consistent assist. We discussed relying on family for meals and taking care of laundry, waiting until she had supervision for showers, and utilizing accessories for her walker such as a tray attachment and a hanging bag to help her move items around the house. Will continue to follow and progress as able per POC.    Follow Up Recommendations  SNF (If denied then maximize [facility] services)     Equipment Recommendations  3in1 (PT)    Recommendations for Other Services OT consult     Precautions / Restrictions Precautions Precautions: Fall Precaution Comments:  Restrictions Weight Bearing Restrictions: No    Mobility  Bed Mobility Overal bed mobility: Modified Independent Bed Mobility: Supine to Sit     Supine to sit: Modified independent (Device/Increase time)     General bed mobility comments: Increased time and use of rails required however overall was able to complete bed mobility without assistance.     Transfers Overall transfer level: Needs assistance Equipment used: Rolling walker (2 wheeled) Transfers: Sit to/from Stand Sit to Stand: Supervision         General transfer comment: Supervision for safety and for proper positioning in front of center of chair before initiating stand>sit. VC's for improved posture and hand placement on seated surface for safety.  Ambulation/Gait Ambulation/Gait assistance: Supervision Gait Distance (Feet): 200 Feet Assistive device: Rolling walker (2 wheeled) Gait Pattern/deviations: Step-through pattern;Decreased stride length Gait velocity: decreased Gait velocity interpretation: 1.31 - 2.62 ft/sec, indicative of limited community ambulator General Gait Details: Slow but generally steady, requiring frequent cues for improved posture, closer walker proximity, and forward gaze. No overt LOB noted however pt very guarded throughout gait training.   Stairs         General stair comments: Deferred as pt has a ramp at home.   Wheelchair Mobility    Modified Rankin (Stroke Patients Only) Modified Rankin (Stroke Patients Only) Pre-Morbid Rankin Score: No symptoms Modified Rankin: Moderately severe disability     Balance Overall balance assessment: Needs assistance Sitting-balance support: No upper extremity supported;Feet supported Sitting balance-Leahy Scale: Good Sitting balance - Comments: slumped posture EOB;no loss of balance or instability when reaching outside base of support   Standing balance support: No upper extremity supported;During functional activity Standing balance-Leahy Scale: Fair Standing balance comment: Utilizing RW for ambulation but able to static stand and perform ADLs without UE support  Cognition Arousal/Alertness: Awake/alert Behavior During Therapy: Anxious Overall Cognitive Status: Within Functional Limits for tasks assessed Area of Impairment: Memory                      Memory: Decreased short-term memory   Safety/Judgement: Decreased awareness of safety     General Comments: Continues to demonstrate expressive difficulties, word finding. Improves when pt slows down and focuses on single task.      Exercises General Exercises - Lower Extremity Heel Slides: Both;15 reps Hip ABduction/ADduction: Both;15 reps Other Exercises Other Exercises: Bridging x15 Other Exercises: isometric adduction (pillow squeeze) in hooklying x15    General Comments        Pertinent Vitals/Pain Pain Assessment: Faces Faces Pain Scale: Hurts whole lot Pain Location: headache Pain Descriptors / Indicators: Discomfort;Grimacing;Headache;Throbbing Pain Intervention(s): Patient requesting pain meds-RN notified;Monitored during session    Home Living                      Prior Function            PT Goals (current goals can now be found in the care plan section) Acute Rehab PT Goals Patient Stated Goal: hoping to go to rehab PT Goal Formulation: With patient Time For Goal Achievement: 09/16/20 Potential to Achieve Goals: Good Progress towards PT goals: Progressing toward goals    Frequency    Min 4X/week      PT Plan Current plan remains appropriate    Co-evaluation              AM-PAC PT "6 Clicks" Mobility   Outcome Measure  Help needed turning from your back to your side while in a flat bed without using bedrails?: None Help needed moving from lying on your back to sitting on the side of a flat bed without using bedrails?: None Help needed moving to and from a bed to a chair (including a wheelchair)?: None Help needed standing up from a chair using your arms (e.g., wheelchair or bedside chair)?: A Little Help needed to walk in hospital room?: A Little Help needed climbing 3-5 steps with a railing? : A Little 6 Click Score: 21    End of Session Equipment Utilized During Treatment: Gait belt Activity Tolerance:  Patient tolerated treatment well Patient left: in chair;with call bell/phone within reach;with chair alarm set;with nursing/sitter in room Nurse Communication: Mobility status PT Visit Diagnosis: Unsteadiness on feet (R26.81);Other abnormalities of gait and mobility (R26.89);Muscle weakness (generalized) (M62.81)     Time: 0981-1914 PT Time Calculation (min) (ACUTE ONLY): 32 min  Charges:  $Gait Training: 8-22 mins $Therapeutic Exercise: 8-22 mins                     Rolinda Roan, PT, DPT Acute Rehabilitation Services Pager: [phone] Office: [phone]    Thelma Comp 09/05/2020, 9:53 AM

## 2020-09-05 NOTE — TOC Transition Note (Signed)
Transition of Care [facility]) - CM/SW Discharge Note   Patient Details  Name: Morgan Dixon MRN: [id number] Date of Birth: 17-Mar-1951  [facility] CM/SW Contact:  Geralynn Ochs, LCSW Phone Number: 09/05/2020, 12:08 PM   Clinical Narrative:   [place] upheld the denial for SNF for the patient, and daughter agreeable to taking the patient home to her house with home health. Patient says she's used [facility] in the past, but is agreeable to others if they aren't able to staff. CSW sent referral to [facility], awaiting on if they can accept. CSW answered patient and daughter's questions and updated RN that they are ready for discharge.     Final next level of care: [place] Barriers to Discharge: Barriers Resolved   Patient Goals and CMS Choice Patient states their goals for this hospitalization and ongoing recovery are:: get better CMS Medicare.gov Compare Post Acute Care list provided to:: Patient Represenative (must comment) Choice offered to / list presented to : Adult Children  Discharge Placement                Patient to be transferred to facility by: Family car Name of family member notified: Juanita Patient and family notified of of transfer: 09/05/20  Discharge Plan and Services In-house Referral: Clinical Social Work   Post Acute Care Choice: [place]                               Social Determinants of Health (SDOH) Interventions     Readmission Risk Interventions Readmission Risk Prevention Plan 08/19/2020  Medication Screening Complete  Transportation Screening Complete  Some recent data might be hidden

## 2020-09-05 NOTE — Progress Notes (Signed)
Made aware for plans to discharge today I have ordered Zio AT to be placed today prior her leaving today and made the ECG department aware Follow up with Dr. Quentin Ore is also in place.  Tommye Standard, PA-C

## 2020-09-05 NOTE — Progress Notes (Signed)
Zio patch placed onto patient.  All instructions and information reviewed with patient, they verbalize understanding with no questions. 

## 2020-09-05 NOTE — Care Management Important Message (Signed)
Important Message  Patient Details  Name: Morgan Dixon MRN: [id number] Date of Birth: 1950/08/10   Medicare Important Message Given:  Yes     Shelda Altes 09/05/2020, 12:13 PM

## 2020-09-05 NOTE — Progress Notes (Signed)
Discharge packet provided with teach-back method. VS wnL and as per flow. IVs and telemetry removed, Pt verbalized understanding. All questions and concerns addressed. Awaiting on daughter for transport back home.

## 2020-09-05 NOTE — Progress Notes (Unsigned)
[id number] applied in hospital.

## 2020-09-05 NOTE — Discharge Summary (Signed)
Physician Discharge Summary  Morgan Dixon [id number] DOB: [birth year] DOA: 08/17/2020  PCP: Alfonse Flavors, MD  Admit date: 08/17/2020 Discharge date: 09/05/2020  Admitted From: Home  Discharge disposition: Home with home health   Recommendations for Outpatient Follow-Up:   . Follow up with your primary care provider in one week.  . Check CBC, BMP, magnesium in the next visit   Discharge Diagnosis:   Principal Problem:   CVA (cerebral vascular accident) ([place]) Active Problems:   Hypokalemia   Left renal mass   Acute metabolic encephalopathy   Obesity   Benign essential HTN    Discharge Condition: Improved.  Diet recommendation: Low sodium, heart healthy.  Carbohydrate-modified.  Regular.  Wound care: None.  Code status: Full.   History of Present Illness:   Morgan Dixon is a 70 year old female with past medical history of gastric bypass on 07/19/2018 hypertension and chronic back pain presented to the hospital after episode of confusion.  Patient was noted to have left parietal subacute infarct and was admitted to hospital for further evaluation and treatment.    Hospital Course:   Following conditions were addressed during hospitalization as listed below,   CVA with expressive aphasia and right UE weakness, new small hemorrhage.  Initial CT scan of the head showed hypoattenuation of the left temporoparietal area.  MRI was unable to be obtained due to spinal stimulator.  CT venogram showed some leptomeningeal contrast over the area of the abnormal density consistent with subacute infarct.  2D echocardiogram did not show any evidence of thrombus.  Neurology was consulted and recommended aspirin and Plavix for 3 weeks followed by Plavix alone.  Patient continued to have a headache so CT head scan was done  which showed a small area of hemorrhage.  Neurology has seen the patient and recommended to discontinue Plavix. Ptient will need a loop recorder as  outpatient.  Continue speech therapy, statins.  Will need continued speech therapy for expressive aphasia.  Electrophysiology plans to do loop as outpatient but will need Zio patch on day of discharge.  Neurology has notified cardiology about Zio patch.  Currently awaiting for rehabilitation.  Headache.  Left-sided and constant as per the patient.  Spoke with Dr. Leonie Man neurology yesterday..  Recommended to start Topamax 25 mg twice daily for now.  Still complains of pain on the left side.  We will add Fioricet.  If not improved by tomorrow we will reconsider discussing with neurology.  Essential hypertension Currently on amlodipine and losartan.  Patient was on Catapres and HCTZ at home.  Blood pressure seems to be stable at this time.  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 -s/p 2021 venting gastrostomy tube (now removed) - status post laparoscopic distal gastrectomy 3/29 status post incisional hernia repair on 4/4.  GI was consulted while the patient was at [facility] and prefer not to do work-up in the acute neurological state.  On aspirin and Plavix which will need to be continued.  Hemoglobin has however remained stable since admission.  Hem Occult repeated on 08/27/20 was negative.  Latest hemoglobin of 9.6 at this time.  Anterior abdominal wall wound from prior PEG tube In the process of healing.  Tylenol as needed for pain.  Anxiety-  Patient stated that her husband had passed away recently on Apr 26, 2020.  Zoloft has been resumed and increased to 50 mg  as Wellbutrin has been resumed half dose.  We will gradually need to wean Wellbutrin since it has higher risk of seizures.  Decreased the dose to 200 mg daily from 300 mg/day on 09/02/2020.  Acute metabolic encephalopathy-  Improved at this time.  As per the family, patient has had some memory issues  starting 2012.  Obesity Body mass index is 35.51 kg/m. Status post gastric bypass.  Left renal mass CT abd/pelvis > Small left renal cortical mass, posteromedial midpole, which is larger than prior exam.  Possibility of a small renal neoplasm.  Recommended CT scan of the abdomen with and without contrast in 3 to 6 months since the patient is unable to take MRI due to spinal stimulator.    H/o DVT with PE   Headache with some sinus tenderness.   Has microhemorrhage in the brain on the CT head scan.  Neurology on board.  Hold Plavix for now.  Topamax has been initiated.  Add ferritin today.   Disposition.  At this time, patient is stable for disposition home with home health.  Medical Consultants:   Neurology EP cardiology  Procedures:    Zio Patch Subjective:   Today, patient was seen and examined at bedside.  Feels frustrated about going home.  Denies any nausea vomiting fever.  Mild headache.  Discharge Exam:   Vitals:   09/05/20 0911 09/05/20 0915  BP:  129/66  Pulse:    Resp: 16   Temp:    SpO2: 99%    Vitals:   09/05/20 0401 09/05/20 0721 09/05/20 0911 09/05/20 0915  BP: 128/60 (!) 125/53  129/66  Pulse: (!) 47 (!) 53    Resp: 18 16 16    Temp: 98 F (36.7 C) 98.1 F (36.7 C)    TempSrc:  Oral    SpO2: 99% 98% 99%   Weight:      Height:        General: Alert awake, not in obvious distress, anxious, HENT: pupils equally reacting to light,  No scleral pallor or icterus noted. Oral mucosa is moist. .  Dentures present. Chest:  Clear breath sounds.  Diminished breath sounds bilaterally. No crackles or wheezes.  CVS: S1 &S2 heard. No murmur.  Regular rate and rhythm. Abdomen: Soft, nontender, nondistended.  Bowel sounds are heard.   Extremities: No cyanosis, clubbing or edema.  Peripheral pulses are palpable.  Mild right upper extremity weakness Psych: Alert, awake and oriented, normal mood CNS: Mild expressive aphasia improving.  Mild right upper  extremity weakness. Skin: Warm and dry.  No rashes noted.  The results of significant diagnostics from this hospitalization (including imaging, microbiology, ancillary and laboratory) are listed below for reference.     Diagnostic Studies:   CT Head Wo Contrast  Result Date: 08/17/2020 CLINICAL DATA:  Altered mental status. EXAM: CT HEAD WITHOUT CONTRAST TECHNIQUE: Contiguous axial images were obtained from the base of the skull through the vertex without intravenous contrast. COMPARISON:  None. FINDINGS: Brain: Hypoattenuation in the posterior left temporal and parietal lobes is mainly in the white matter with overlying cortex largely preserved worrisome for tumor. No hemorrhage, mass, midline shift or abnormal extra-axial fluid collection is identified. No hydrocephalus or pneumocephalus. Vascular: No hyperdense vessel or unexpected calcification. Skull: Intact.  No focal lesion. Sinuses/Orbits: Negative. Other: None. IMPRESSION: Hypoattenuation left temporoparietal lobe has an appearance worrisome for neoplasm. Recommend brain MRI with and without contrast. Electronically Signed   By: Inge Rise M.D.   On: 08/17/2020 16:46  CT Abdomen Pelvis W Contrast  Result Date: 08/17/2020 CLINICAL DATA:  Abdominal pain. Recent gastric bypass surgery. Positive Hemoccult. EXAM: CT ABDOMEN AND PELVIS WITH CONTRAST TECHNIQUE: Multidetector CT imaging of the abdomen and pelvis was performed using the standard protocol following bolus administration of intravenous contrast. CONTRAST:  167mL OMNIPAQUE IOHEXOL 300 MG/ML  SOLN COMPARISON:  05/24/2020. FINDINGS: Lower chest: No acute abnormality. Hepatobiliary: Liver normal in size. No mass or focal lesion. Status post cholecystectomy. No bile duct dilation. Pancreas: Pancreatic atrophy.  No mass.  No inflammation. Spleen: Prominent spleen, 13 cm in greatest dimension, unchanged. No splenic masses. Adrenals/Urinary Tract: No adrenal masses. Kidneys are normal in  overall size and position. Symmetric renal enhancement and excretion. Small mass arises from the posteromedial midpole of the left kidney, 1.3 cm in greatest dimension, increased in size from the prior CT where it measured 1 cm. Mass has average Hounsfield units of 54. No other renal masses, no stones and no hydronephrosis. Ureters are normal in course and in caliber. Bladder is unremarkable. Stomach/Bowel: Stomach demonstrates postsurgical changes. Small gastric remnant. Stomach appears to into the into a gastrojejunostomy. Duodenum appears ligated, lying porta hepatis. Small bowel and colon are normal in caliber. No wall thickening. No evidence of inflammation. Vascular/Lymphatic: No significant vascular findings are present. No enlarged abdominal or pelvic lymph nodes. Reproductive: Status post hysterectomy. No adnexal masses. Other: No abdominal wall hernia. Scarring in the midline upper abdomen where there was a small hernia on the prior exam. No ascites. Musculoskeletal: No fracture or acute finding. No bone lesion. Stable spine stimulator leads along the posterior lower thoracic spinal canal. IMPRESSION: 1. No acute abnormalities within the abdomen or pelvis. No evidence of bowel obstruction or inflammation. 2. 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 No evidence of a surgery complication. 3. Previously seen ventral hernia has been repaired. No residual or new abdominal wall hernia. 4. Small left renal cortical mass, posteromedial midpole, which is larger than prior exam. This could reflect a small renal neoplasm. Recommend follow-up renal MRI without and with contrast, if this patient can tolerate that procedure. If patient cannot have MRI, recommend follow-up CT the abdomen without and with contrast in 3-6 months to assess for stability. Electronically Signed   By: Lajean Manes M.D.   On: 08/17/2020 16:48   DG Chest Port 1 View  Result Date:  08/17/2020 CLINICAL DATA:  Altered mental status and fevers EXAM: PORTABLE CHEST 1 VIEW COMPARISON:  12/11/2012 FINDINGS: Cardiac shadow is within normal limits. Postsurgical changes are again seen and stable. The lungs are well aerated without focal infiltrate. No acute bony abnormality is seen. IMPRESSION: No acute abnormality noted. Electronically Signed   By: Inez Catalina M.D.   On: 08/17/2020 18:48     Labs:   Basic Metabolic Panel: Recent Labs  Lab 09/01/20 0424 09/03/20 0041 09/05/20 0551  NA 136 137 138  K 4.2 4.2 4.3  CL 105 107 106  CO2 27 23 27   GLUCOSE 116* 102* 95  BUN 27* 25* 22  CREATININE 1.08* 0.79 0.94  CALCIUM 9.2 8.9 9.3  MG 2.2 2.1  --   PHOS  --  3.6  --    GFR Estimated Creatinine Clearance: 67.3 mL/min (by C-G formula based on SCr of 0.94 mg/dL). Liver Function Tests: No results for input(s): AST, ALT, ALKPHOS, BILITOT, PROT, ALBUMIN in the last 168 hours. No results for input(s): LIPASE, AMYLASE in the last 168 hours. No results  for input(s): AMMONIA in the last 168 hours. Coagulation profile No results for input(s): INR, PROTIME in the last 168 hours.  CBC: Recent Labs  Lab 08/31/20 0330 09/03/20 0041 09/05/20 0551  WBC 4.9 4.3 4.9  HGB 10.7* 9.6* 10.2*  HCT 33.1* 29.8* 31.8*  MCV 91.7 91.4 91.9  PLT 227 208 209   Cardiac Enzymes: No results for input(s): CKTOTAL, CKMB, CKMBINDEX, TROPONINI in the last 168 hours. BNP: Invalid input(s): POCBNP CBG: No results for input(s): GLUCAP in the last 168 hours. D-Dimer No results for input(s): DDIMER in the last 72 hours. Hgb A1c No results for input(s): HGBA1C in the last 72 hours. Lipid Profile No results for input(s): CHOL, HDL, LDLCALC, TRIG, CHOLHDL, LDLDIRECT in the last 72 hours. Thyroid function studies No results for input(s): TSH, T4TOTAL, T3FREE, THYROIDAB in the last 72 hours.  Invalid input(s): FREET3 Anemia work up No results for input(s): VITAMINB12, FOLATE, FERRITIN, TIBC,  IRON, RETICCTPCT in the last 72 hours. Microbiology Recent Results (from the past 240 hour(s))  Resp Panel by RT-PCR (Flu A&B, Covid) Nasopharyngeal Swab     Status: None   Collection Time: 08/28/20 10:16 AM   Specimen: Nasopharyngeal Swab; Nasopharyngeal(NP) swabs in vial transport medium  Result Value Ref Range Status   SARS Coronavirus 2 by RT PCR NEGATIVE NEGATIVE Final    Comment: (NOTE) SARS-CoV-2 target nucleic acids are NOT DETECTED.  The SARS-CoV-2 RNA is generally detectable in upper respiratory specimens during the acute phase of infection. The lowest concentration of SARS-CoV-2 viral copies this assay can detect is 138 copies/mL. A negative result does not preclude SARS-Cov-2 infection and should not be used as the sole basis for treatment or other patient management decisions. A negative result may occur with  improper specimen collection/handling, submission of specimen other than nasopharyngeal swab, presence of viral mutation(s) within the areas targeted by this assay, and inadequate number of viral copies(<138 copies/mL). A negative result must be combined with clinical observations, patient history, and epidemiological information. The expected result is Negative.  Fact Sheet for Patients:  EntrepreneurPulse.com.au  Fact Sheet for Healthcare Providers:  IncredibleEmployment.be  This test is no t yet approved or cleared by the [place] FDA and  has been authorized for detection and/or diagnosis of SARS-CoV-2 by FDA under an Emergency Use Authorization (EUA). This EUA will remain  in effect (meaning this test can be used) for the duration of the COVID-19 declaration under Section 564(b)(1) of the Act, 21 U.S.C.section 360bbb-3(b)(1), unless the authorization is terminated  or revoked sooner.       Influenza A by PCR NEGATIVE NEGATIVE Final   Influenza B by PCR NEGATIVE NEGATIVE Final    Comment: (NOTE) The Xpert Xpress  SARS-CoV-2/FLU/RSV plus assay is intended as an aid in the diagnosis of influenza from Nasopharyngeal swab specimens and should not be used as a sole basis for treatment. Nasal washings and aspirates are unacceptable for Xpert Xpress SARS-CoV-2/FLU/RSV testing.  Fact Sheet for Patients: EntrepreneurPulse.com.au  Fact Sheet for Healthcare Providers: IncredibleEmployment.be  This test is not yet approved or cleared by the [place] FDA and has been authorized for detection and/or diagnosis of SARS-CoV-2 by FDA under an Emergency Use Authorization (EUA). This EUA will remain in effect (meaning this test can be used) for the duration of the COVID-19 declaration under Section 564(b)(1) of the Act, 21 U.S.C. section 360bbb-3(b)(1), unless the authorization is terminated or revoked.  Performed at [facility], [place] [address] [place], [place]  [address]      Discharge Instructions:   Discharge Instructions    Ambulatory referral to Neurology   Complete by: As directed    Follow up with stroke clinic NP (Jessica Vanschaick or Cecille Rubin, if both not available, consider Zachery Dauer, or A[place]) at [facility] in about 4 weeks. Thanks.   Diet - low sodium heart healthy   Complete by: As directed    Discharge instructions   Complete by: As directed    Follow-up with [facility] as outpatient (office to call for appointment)..  Continue aspirin and Plavix as prescribed.  Continue physical therapy at home.  Follow-up with your primary care physician in 1 week.  You will need to repeat CT scan of abdomen with and without contrast in 3 to 6 months.  Seek medical attention for worsening symptoms.   Increase activity slowly   Complete by: As directed    Increase activity slowly   Complete by: As directed    No wound care   Complete by: As directed    No wound care   Complete by: As directed      Allergies as of 09/05/2020       Reactions   Bupropion Nausea And Vomiting   Diazepam Rash   Penicillins Itching   Rofecoxib Swelling   Latex Itching   When used gloves in the past   Prozac [fluoxetine Hcl] Itching, Rash   Zinc Oxide [mexsana] Rash      Medication List    STOP taking these medications   cloNIDine 0.1 MG tablet Commonly known as: CATAPRES   enoxaparin 40 MG/0.4ML injection Commonly known as: LOVENOX   hydrochlorothiazide 25 MG tablet Commonly known as: HYDRODIURIL   ondansetron 4 MG tablet Commonly known as: ZOFRAN   potassium chloride 10 MEQ tablet Commonly known as: KLOR-CON     TAKE these medications   acetaminophen 500 MG tablet Commonly known as: TYLENOL Take 2 tablets (1,000 mg total) by mouth every 6 (six) hours as needed for moderate pain. What changed:   how much to take  reasons to take this   amLODipine 5 MG tablet Commonly known as: NORVASC Take 1 tablet (5 mg total) by mouth daily.   aspirin 81 MG chewable tablet Chew 1 tablet (81 mg total) by mouth daily for 14 days.   atorvastatin 40 MG tablet Commonly known as: LIPITOR Take 1 tablet (40 mg total) by mouth daily.   buPROPion 150 MG 12 hr tablet Commonly known as: WELLBUTRIN SR Take 1 tablet (150 mg total) by mouth daily. What changed:   how much to take  when to take this   butalbital-acetaminophen-caffeine 50-325-40 MG tablet Commonly known as: FIORICET Take 1 tablet by mouth every 6 (six) hours as needed for headache.   clopidogrel 75 MG tablet Commonly known as: PLAVIX Take 1 tablet (75 mg total) by mouth daily with breakfast.   losartan 25 MG tablet Commonly known as: COZAAR Take 1 tablet (25 mg total) by mouth daily. Start taking on: Sep 06, 2020   melatonin 3 MG Tabs tablet Take 1 tablet (3 mg total) by mouth at bedtime.   multivitamin tablet Take 1 tablet by mouth daily. Bariatric vitamin   ondansetron 4 MG disintegrating tablet Commonly known as: ZOFRAN-ODT Take 4 mg by mouth  every 8 (eight) hours as needed for nausea or vomiting.   pantoprazole 40 MG tablet Commonly known as: PROTONIX Take 40 mg by mouth daily.   pregabalin 100 MG  capsule Commonly known as: LYRICA Take 200 mg by mouth 2 (two) times daily.   sertraline 25 MG tablet Commonly known as: ZOLOFT Take 25 mg by mouth at bedtime.   tiZANidine 4 MG tablet Commonly known as: ZANAFLEX Take 4 mg by mouth 3 (three) times daily as needed for muscle spasms.   topiramate 25 MG tablet Commonly known as: TOPAMAX Take 1 tablet (25 mg total) by mouth 2 (two) times daily.       Follow-up Information    Vickie Epley, MD Follow up.   Specialties: Cardiology, Radiology Why: 10/07/2020 @ 9:00AM, follow up on heart monitor (loop) implant timing/decision Contact information: [place] [address] [phone]        [facility]. Schedule an appointment as soon as possible for a visit in 4 week(s).   Specialty: Neurology Contact information: [place] [phone]               Time coordinating discharge: 39 minutes  Signed:  Jarita Raval  [facility] 09/05/2020, 2:10 PM

## 2020-10-06 NOTE — Progress Notes (Deleted)
Electrophysiology Office Follow up Visit Note:    Date:  10/06/2020   ID:  Morgan Dixon, DOB [birth year], MRN [id number]  PCP:  Alfonse Flavors, MD  [place] Cardiologist:  None  [facility] Electrophysiologist:  Vickie Epley, MD    Interval History:    Morgan Dixon is a 70 y.o. female who presents for a follow up visit.   I last saw the patient Sep 01, 2020 while she was hospitalized after cryptogenic stroke.  She was initially admitted to the hospital Aug 17, 2020 with altered mental status.  There was no evidence of atrial fibrillation during the hospitalization.  Initially we are consulted to implant a loop recorder during the hospitalization but because of a chronic wound on her abdomen we elected to wait until the outpatient setting to allow the wound more time to heal.     Past Medical History:  Diagnosis Date   Chronic pain    Edema extremities     Past Surgical History:  Procedure Laterality Date   ABDOMINAL HYSTERECTOMY  1989   BACK SURGERY     GASTRIC BYPASS      Current Medications: No outpatient medications have been marked as taking for the 10/07/20 encounter (Appointment) with Vickie Epley, MD.     Allergies:   Bupropion, Diazepam, Penicillins, Rofecoxib, Latex, Prozac [fluoxetine hcl], and Zinc oxide [mexsana]   Social History   Socioeconomic History   Marital status: Widowed    Spouse name: Not on file   Number of children: Not on file   Years of education: Not on file   Highest education level: Not on file  Occupational History   Not on file  Tobacco Use   Smoking status: Never   Smokeless tobacco: Never  Vaping Use   Vaping Use: Never used  Substance and Sexual Activity   Alcohol use: Never   Drug use: Never   Sexual activity: Not on file  Other Topics Concern   Not on file  Social History Narrative   Not on file   Social Determinants of Health   Financial Resource Strain: Not on file  Food  Insecurity: Not on file  Transportation Needs: Not on file  Physical Activity: Not on file  Stress: Not on file  Social Connections: Not on file     Family History: The patient's family history includes Breast cancer (age of onset: 73) in her maternal aunt; Diabetes in her father; Stroke in her father.  ROS:   Please see the history of present illness.    All other systems reviewed and are negative.  EKGs/Labs/Other Studies Reviewed:    The following studies were reviewed today:  September 26, 2020 ZIO personally reviewed HR 39 - 197bpm, average 61 bpm. 57 episodes of SVT, longest lasting 19.7 seconds with an average HR 136bpm. Review of rhythm strip suggests an AT. Rare supraventricular ectopy. PVC burden 7.1%.     EKG:  The ekg ordered today demonstrates ***  Recent Labs: 08/24/2020: ALT 15 09/03/2020: Magnesium 2.1 09/05/2020: BUN 22; Creatinine, Ser 0.94; Hemoglobin 10.2; Platelets 209; Potassium 4.3; Sodium 138  Recent Lipid Panel    Component Value Date/Time   CHOL 162 08/21/2020 0238   TRIG 81 08/21/2020 0238   HDL 58 08/21/2020 0238   CHOLHDL 2.8 08/21/2020 0238   VLDL 16 08/21/2020 0238   LDLCALC 88 08/21/2020 0238    Physical Exam:    VS:  There were no vitals taken for this visit.  Wt Readings from Last 3 Encounters:  08/19/20 220 lb 0.3 oz (99.8 kg)  05/24/20 242 lb 8.1 oz (110 kg)  04/15/19 242 lb (109.8 kg)     GEN: *** Well nourished, well developed in no acute distress HEENT: Normal NECK: No JVD; No carotid bruits LYMPHATICS: No lymphadenopathy CARDIAC: ***RRR, no murmurs, rubs, gallops RESPIRATORY:  Clear to auscultation without rales, wheezing or rhonchi  ABDOMEN: Soft, non-tender, non-distended MUSCULOSKELETAL:  No edema; No deformity  SKIN: Warm and dry NEUROLOGIC:  Alert and oriented x 3 PSYCHIATRIC:  Normal affect   ASSESSMENT:    1. Cerebrovascular accident (CVA), unspecified mechanism ([place])   2. PVC's (premature ventricular  contractions)   3. Open wound of abdominal wall, subsequent encounter    PLAN:    In order of problems listed above:  1. Cerebrovascular accident (CVA), unspecified mechanism ([place])   2. PVC's (premature ventricular contractions)   3. Open wound of abdominal wall, subsequent encounter    Total time spent with patient today *** minutes. This includes reviewing records, evaluating the patient and coordinating care.   Medication Adjustments/Labs and Tests Ordered: Current medicines are reviewed at length with the patient today.  Concerns regarding medicines are outlined above.  No orders of the defined types were placed in this encounter.  No orders of the defined types were placed in this encounter.    Signed, Lars Mage, MD, [facility], [facility] 10/06/2020 10:28 PM    Electrophysiology [facility]    -------------------------------------------------------------------------------  SURGEON:  Lars Mage, MD    PREPROCEDURE DIAGNOSIS:  Cryptogenic stroke    POSTPROCEDURE DIAGNOSIS:  Cryptogenic stroke     PROCEDURES:   1. Implantable loop recorder implantation    INTRODUCTION:  Morgan Dixon is a 70 y.o. patient with a history of cryptogenic stroke. Inpatient telemetry has been reviewed and not shown atrial fibrillation. The patient therefore presents today for implantable loop implantation.     DESCRIPTION OF PROCEDURE:  Informed written consent was obtained.  The patient required no sedation for the procedure today.  Mapping over the patient's chest was performed to identify the area where electrograms were most prominent for ILR recording.  This area was found to be the left parasternal region over the 4th intercostal space. The patients left chest was therefore prepped and draped in the usual sterile fashion. The skin overlying the left parasternal region was infiltrated with lidocaine for local analgesia.  A 0.5-cm incision was made over the  left parasternal region over the 3rd intercostal space.  A subcutaneous ILR pocket was fashioned using a combination of sharp and blunt dissection.  A Medtronic Reveal Linq model [id number] (***) implantable loop recorder was then placed into the pocket  R waves were very prominent and measured >0.33mV.  Steri- Strips and a sterile dressing were then applied.  There were no early apparent complications.     CONCLUSIONS:   1. Successful implantation of a Medtronic Reveal LINQ implantable loop recorder for a history of cryptogenic stroke  2. No early apparent complications.   Lars Mage, MD 06/13/2020 3:51 PM

## 2020-10-07 ENCOUNTER — Ambulatory Visit: Payer: Medicare HMO | Admitting: Cardiology

## 2020-10-07 DIAGNOSIS — I639 Cerebral infarction, unspecified: Secondary | ICD-10-CM

## 2020-10-07 DIAGNOSIS — S31109D Unspecified open wound of abdominal wall, unspecified quadrant without penetration into peritoneal cavity, subsequent encounter: Secondary | ICD-10-CM

## 2020-10-07 DIAGNOSIS — I493 Ventricular premature depolarization: Secondary | ICD-10-CM

## 2020-10-19 ENCOUNTER — Inpatient Hospital Stay: Payer: Medicare HMO | Admitting: Adult Health

## 2020-10-19 NOTE — Progress Notes (Deleted)
[facility] [address] [name]. [place] [address] [id number]       [place] NOTE  [name] Date of Birth:  Dec 07, 1950 Medical Record Number:  [id number]   Reason for Referral:  hospital stroke follow up    SUBJECTIVE:   CHIEF COMPLAINT:  No chief complaint on file.   HPI:   [name] is a 70 y.o. female with history of chronic back pain/chronic pain, hypertension, status post prior gastric bypass surgery, and s/p spinal stimulator implant who presented to [facility] ED on 08/17/2020 with AMS and speech difficulties.  Personally reviewed hospitalization pertinent progress notes, lab work and imaging summary provided.  She was admitted for work-up of acute metabolic encephalopathy with CT head showing left parietal temporal lobe hypodensity initially concerning for mass but repeat CT head with contrast more likely subacute stroke in left parietal area rather than mass.  Unable to complete MRI due to spinal stimulator.  Infarct embolic secondary to unknown source and recommended 30-day cardiac event monitor vs ILR to evaluate for A. fib.  HTN stable.  LDL 88 and started on atorvastatin 40 mg daily.  Other stroke risk factors include advanced age and obesity.  Stroke team signed off on 5/9 but reevaluated on 5/19 with patient complaining of headache and repeat CT head showed small focus of hemorrhagic conversion with repeat imaging the following day showing stable appearance.  Initially placed on DAPT but in setting of hemorrhagic conversion Plavix discontinued and recommended continuing aspirin 81 mg daily alone.  Was started on topiramate 25 mg twice daily for headache prophylaxis.  Therapies initially recommended SNF but due to insurance denial, she was discharged home with her daughter with home health therapies.  Discharged home on 09/05/2020.   Stroke: left posterior MCA infarct with small hemorrhagic conversion - embolic pattern -  etiology unclear Resultant   Mild expressive aphasia CT head - Stable appearance of cortical and subcortical hypoattenuation in the left posterior frontal and parietal lobe. MRI head - not able to do due to spinal stimulator CTV head - no dural venous thrombosis CTA H&N - negative CT head 5/18 - a small focus of hemorrhagic conversion CT head 5/19 - stable hemorrhagic conversion, no new finding 2D Echo - EF 60 - 65% EP on board for plan of cardiac monitoring - currently recommend Zio patch on discharge, if negative, consider loop as outpt. Sars Corona Virus 2 - negative LDL - 88 HgbA1c - 4.9 UDS - negative VTE prophylaxis - SCDs aspirin 81 mg daily prior to admission, was on ASA 81 and plavix DAPT. Due to small hemorrhagic conversion, will d/c plavix. Will continue ASA 81 on discharge.   Patient will be counseled to be compliant with her antithrombotic medications Ongoing aggressive stroke risk factor management Therapy recommendations SNF --> HH (insurance denied SNF) Disposition:  home      ROS:   14 system review of systems performed and negative with exception of ***  PMH:  Past Medical History:  Diagnosis Date   Chronic pain    Edema extremities     PSH:  Past Surgical History:  Procedure Laterality Date   ABDOMINAL HYSTERECTOMY  1989   BACK SURGERY     GASTRIC BYPASS      Social History:  Social History   Socioeconomic History   Marital status: Widowed    Spouse name: Not on file   Number of children: Not on file   Years of  education: Not on file   Highest education level: Not on file  Occupational History   Not on file  Tobacco Use   Smoking status: Never   Smokeless tobacco: Never  Vaping Use   Vaping Use: Never used  Substance and Sexual Activity   Alcohol use: Never   Drug use: Never   Sexual activity: Not on file  Other Topics Concern   Not on file  Social History Narrative   Not on file   Social Determinants of Health   Financial  Resource Strain: Not on file  Food Insecurity: Not on file  Transportation Needs: Not on file  Physical Activity: Not on file  Stress: Not on file  Social Connections: Not on file  Intimate Partner Violence: Not on file    Family History:  Family History  Problem Relation Age of Onset   Breast cancer Maternal Aunt 60   Diabetes Father    Stroke Father     Medications:   Current Outpatient Medications on File Prior to Visit  Medication Sig Dispense Refill   acetaminophen (TYLENOL) 500 MG tablet Take 2 tablets (1,000 mg total) by mouth every 6 (six) hours as needed for moderate pain. 30 tablet 0   amLODipine (NORVASC) 5 MG tablet Take 1 tablet (5 mg total) by mouth daily. 30 tablet 2   atorvastatin (LIPITOR) 40 MG tablet Take 1 tablet (40 mg total) by mouth daily. 30 tablet 2   buPROPion (WELLBUTRIN SR) 150 MG 12 hr tablet Take 1 tablet (150 mg total) by mouth daily.     butalbital-acetaminophen-caffeine (FIORICET) 50-325-40 MG tablet Take 1 tablet by mouth every 6 (six) hours as needed for headache. 14 tablet 0   clopidogrel (PLAVIX) 75 MG tablet Take 1 tablet (75 mg total) by mouth daily with breakfast. 90 tablet 2   losartan (COZAAR) 25 MG tablet Take 1 tablet (25 mg total) by mouth daily. 30 tablet 2   melatonin 3 MG TABS tablet Take 1 tablet (3 mg total) by mouth at bedtime. 30 tablet 0   Multiple Vitamin (MULTIVITAMIN) tablet Take 1 tablet by mouth daily. Bariatric vitamin     ondansetron (ZOFRAN-ODT) 4 MG disintegrating tablet Take 4 mg by mouth every 8 (eight) hours as needed for nausea or vomiting.     pantoprazole (PROTONIX) 40 MG tablet Take 40 mg by mouth daily.     pregabalin (LYRICA) 100 MG capsule Take 200 mg by mouth 2 (two) times daily.     sertraline (ZOLOFT) 25 MG tablet Take 25 mg by mouth at bedtime.     tiZANidine (ZANAFLEX) 4 MG tablet Take 4 mg by mouth 3 (three) times daily as needed for muscle spasms.     topiramate (TOPAMAX) 25 MG tablet Take 1 tablet (25 mg  total) by mouth 2 (two) times daily. 60 tablet 2   No current facility-administered medications on file prior to visit.    Allergies:   Allergies  Allergen Reactions   Bupropion Nausea And Vomiting   Diazepam Rash   Penicillins Itching   Rofecoxib Swelling   Latex Itching    When used gloves in the past   Prozac [Fluoxetine Hcl] Itching and Rash   Zinc Oxide [Mexsana] Rash      OBJECTIVE:  Physical Exam  There were no vitals filed for this visit. There is no height or weight on file to calculate BMI. No results found.  No flowsheet data found.   General: well developed, well nourished, seated, in  no evident distress Head: head normocephalic and atraumatic.   Neck: supple with no carotid or supraclavicular bruits Cardiovascular: regular rate and rhythm, no murmurs Musculoskeletal: no deformity Skin:  no rash/petichiae Vascular:  Normal pulses all extremities   Neurologic Exam Mental Status: Awake and fully alert. Oriented to place and time. Recent and remote memory intact. Attention span, concentration and fund of knowledge appropriate. Mood and affect appropriate.  Cranial Nerves: Fundoscopic exam reveals sharp disc margins. Pupils equal, briskly reactive to light. Extraocular movements full without nystagmus. Visual fields full to confrontation. Hearing intact. Facial sensation intact. Face, tongue, palate moves normally and symmetrically.  Motor: Normal bulk and tone. Normal strength in all tested extremity muscles Sensory.: intact to touch , pinprick , position and vibratory sensation.  Coordination: Rapid alternating movements normal in all extremities. Finger-to-nose and heel-to-shin performed accurately bilaterally. Gait and Station: Arises from chair without difficulty. Stance is normal. Gait demonstrates normal stride length and balance with ***. Tandem walk and heel toe ***.  Reflexes: 1+ and symmetric. Toes downgoing.     NIHSS  *** Modified Rankin   ***      ASSESSMENT: Morgan Dixon is a 70 y.o. year old female with recent left posterior MCA infarct with small hemorrhagic conversion on 10/19/2261 embolic pattern secondary to unclear source.  Vascular risk factors include HTN, HLD, advanced age and obesity.      PLAN:  Left posterior MCA stroke, cryptogenic: Residual deficit: ***. Continue aspirin 81 mg daily  and atorvastatin 40 mg daily for secondary stroke prevention.  Discussed secondary stroke prevention measures and importance of close PCP follow up for aggressive stroke risk factor management  HTN: BP goal <130/90.  Stable on *** per PCP HLD: LDL goal <70. Recent LDL 88 -continue atorvastatin 40 mg daily.      Follow up in *** or call earlier if needed   CC:  [facility] provider: Dr. Leonie Man PCP: Zhou-Talbert, [name], MD    I spent *** minutes of face-to-face and non-face-to-face time with patient.  This included previsit chart review, lab review, study review, order entry, electronic health record documentation, patient education regarding recent stroke, residual deficits, importance of managing stroke risk factors and answered all questions to patient satisfaction     Frann Rider, [facility]  [facility] [address] [place], [place] [address]  Phone [phone] Fax [phone] Note: This document was prepared with digital dictation and possible smart phrase technology. Any transcriptional errors that result from this process are unintentional.

## 2020-11-15 ENCOUNTER — Encounter: Payer: Self-pay | Admitting: Adult Health

## 2020-11-15 ENCOUNTER — Ambulatory Visit (INDEPENDENT_AMBULATORY_CARE_PROVIDER_SITE_OTHER): Payer: Medicare HMO | Admitting: Adult Health

## 2020-11-15 VITALS — BP 134/62 | HR 49 | Ht 66.0 in | Wt 255.0 lb

## 2020-11-15 DIAGNOSIS — E785 Hyperlipidemia, unspecified: Secondary | ICD-10-CM

## 2020-11-15 DIAGNOSIS — R4701 Aphasia: Secondary | ICD-10-CM

## 2020-11-15 DIAGNOSIS — I10 Essential (primary) hypertension: Secondary | ICD-10-CM | POA: Diagnosis not present

## 2020-11-15 DIAGNOSIS — I63412 Cerebral infarction due to embolism of left middle cerebral artery: Secondary | ICD-10-CM | POA: Diagnosis not present

## 2020-11-15 NOTE — Patient Instructions (Addendum)
Start therapies as scheduled on 8/10 for hopeful ongoing recovery  Follow up with cardiology regarding placement of loop recorder for longer monitoring of possible atrial fibrillation as stroke etiology   Continue topamax '25mg'$  twice for headache prevention  Continue clopidogrel 75 mg daily  and atorvastatin  for secondary stroke prevention  Continue to follow up with PCP regarding cholesterol and blood pressure management  Maintain strict control of hypertension with blood pressure goal below 130/90, diabetes with hemoglobin A1c goal below 6.5% and cholesterol with LDL cholesterol (bad cholesterol) goal below 70 mg/dL.       Followup in the future with me in 4 months or call earlier if needed       Thank you for coming to see [place] at [facility]. I hope we have been able to provide you high quality care today.  You may receive a patient satisfaction survey over the next few weeks. We would appreciate your feedback and comments so that we may continue to improve ourselves and the health of our patients.

## 2020-11-15 NOTE — Progress Notes (Signed)
[facility] [address] [place]. [place] [address] [phone]       [place] NOTE  [name] Date of Birth:  Aug 06, 1950 Medical Record Number:  [id number]   Reason for Referral:  hospital stroke follow up    SUBJECTIVE:   CHIEF COMPLAINT:  Chief Complaint  Patient presents with   Follow-up    RM 2 with daughter Morgan Dixon  Pt is well, still having some speech difficulty especially with numbers.  Daughter states memory has worsen since stroke, notice issues originally with it in 2012.      HPI:   [name] is a 70 y.o. female with history of chronic back pain/chronic pain, hypertension, status post prior gastric bypass surgery, and s/p spinal stimulator implant who presented to [facility] ED on 08/17/2020 with AMS and speech difficulties.  Personally reviewed hospitalization pertinent progress notes, lab work and imaging summary provided.  She was admitted for work-up of acute metabolic encephalopathy with CT head showing left parietal temporal lobe hypodensity initially concerning for mass but repeat CT head with contrast more likely subacute stroke in left parietal area rather than mass.  Unable to complete MRI due to spinal stimulator.  Infarct embolic secondary to unknown source and recommended 30-day cardiac event monitor vs ILR to evaluate for A. fib.  HTN stable.  LDL 88 and started on atorvastatin 40 mg daily.  Other stroke risk factors include advanced age and obesity.  Stroke team signed off on 5/9 but reevaluated on 5/19 with patient complaining of headache and repeat CT head showed small focus of hemorrhagic conversion with repeat imaging the following day showing stable appearance.  Initially placed on DAPT but in setting of hemorrhagic conversion Plavix discontinued and recommended continuing aspirin 81 mg daily alone.  Was started on topiramate 25 mg twice daily for headache prophylaxis.  Therapies initially recommended  SNF but due to insurance denial, she was discharged home with her daughter with home health therapies.  Discharged home on 09/05/2020.   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 MRI head - not able to do due to spinal stimulator CTV head - no dural venous thrombosis CTA H&N - negative CT head 5/18 - a small focus of hemorrhagic conversion CT head 5/19 - stable hemorrhagic conversion, no new finding 2D Echo - EF 60 - 65% EP on board for plan of cardiac monitoring - currently recommend Zio patch on discharge, if negative, consider loop as outpt. Sars Corona Virus 2 - negative LDL - 88 HgbA1c - 4.9 UDS - negative VTE prophylaxis - SCDs aspirin 81 mg daily prior to admission, was on ASA 81 and plavix DAPT. Due to small hemorrhagic conversion, will d/c plavix. Will continue ASA 81 on discharge.   Patient will be counseled to be compliant with her antithrombotic medications Ongoing aggressive stroke risk factor management Therapy recommendations SNF --> HH (insurance denied SNF) Disposition:  home   Today, 11/15/2020, Morgan Dixon is being seen for hospital follow-up accompanied by her daughter. Reports continued difficulty with word finding and difficulty with numbers. This can be worse in the evening when she is fatigued.  She did do HH SLP but daughter not satisfied with the therapy and plans on starting in [place] on 8/10.  Daughter also reports worsening  cognition compared to baseline with memory concerns since 2012.  She has remained on topiramate 25 mg twice daily tolerating for headache prophylaxis which was started during admission.  Denies new stroke/TIA symptoms.  Remains on Plavix and atorvastatin without associated side effects.  Blood pressure today 134/62.  She  is an established patient of [facility] clinic cardiology but has not yet had follow-up since discharge.  She completed 30-day cardiac event monitor which did not show evidence of A. fib but did show concern of SVT.  She plans on contacting cardiology to schedule follow-up visit.  No further concerns at this time.      ROS:   14 system review of systems performed and negative with exception of those listed in HPI  PMH:  Past Medical History:  Diagnosis Date   Chronic pain    Edema extremities     PSH:  Past Surgical History:  Procedure Laterality Date   ABDOMINAL HYSTERECTOMY  1989   BACK SURGERY     GASTRIC BYPASS      Social History:  Social History   Socioeconomic History   Marital status: Widowed    Spouse name: Not on file   Number of children: Not on file   Years of education: Not on file   Highest education level: Not on file  Occupational History   Not on file  Tobacco Use   Smoking status: Never   Smokeless tobacco: Never  Vaping Use   Vaping Use: Never used  Substance and Sexual Activity   Alcohol use: Never   Drug use: Never   Sexual activity: Not on file  Other Topics Concern   Not on file  Social History Narrative   Not on file   Social Determinants of Health   Financial Resource Strain: Not on file  Food Insecurity: Not on file  Transportation Needs: Not on file  Physical Activity: Not on file  Stress: Not on file  Social Connections: Not on file  Intimate Partner Violence: Not on file    Family History:  Family History  Problem Relation Age of Onset   Breast cancer Maternal Aunt 61   Diabetes Father    Stroke Father     Medications:   Current Outpatient Medications on File Prior to Visit  Medication Sig Dispense Refill   acetaminophen (TYLENOL) 500 MG tablet Take 2 tablets (1,000 mg total) by mouth every 6 (six) hours as needed for moderate pain. 30 tablet 0   atorvastatin (LIPITOR) 40 MG tablet Take 1 tablet (40 mg total) by mouth  daily. 30 tablet 2   buPROPion (WELLBUTRIN SR) 150 MG 12 hr tablet Take 1 tablet (150 mg total) by mouth daily.     butalbital-acetaminophen-caffeine (FIORICET) 50-325-40 MG tablet Take 1 tablet by mouth every 6 (six) hours as needed for headache. 14 tablet 0   clopidogrel (PLAVIX) 75 MG tablet Take 1 tablet (75 mg total) by mouth daily with breakfast. 90 tablet 2   hydrochlorothiazide (HYDRODIURIL) 25 MG tablet Take 25 mg by mouth daily.     losartan (COZAAR) 25 MG tablet Take 1 tablet (25 mg total) by mouth daily. 30 tablet 2   melatonin 3 MG TABS tablet Take 1 tablet (3 mg total) by mouth at bedtime. 30 tablet 0   Multiple Vitamin (MULTIVITAMIN) tablet Take 1 tablet by mouth daily. Bariatric vitamin     ondansetron (ZOFRAN-ODT) 4 MG disintegrating tablet Take 4 mg by mouth every 8 (eight) hours as needed for nausea or  vomiting.     pantoprazole (PROTONIX) 40 MG tablet Take 40 mg by mouth daily.     pregabalin (LYRICA) 100 MG capsule Take 200 mg by mouth 2 (two) times daily.     sertraline (ZOLOFT) 25 MG tablet Take 25 mg by mouth at bedtime.     tiZANidine (ZANAFLEX) 4 MG tablet Take 4 mg by mouth 3 (three) times daily as needed for muscle spasms.     topiramate (TOPAMAX) 25 MG tablet Take 1 tablet (25 mg total) by mouth 2 (two) times daily. 60 tablet 2   No current facility-administered medications on file prior to visit.    Allergies:   Allergies  Allergen Reactions   Bupropion Nausea And Vomiting   Diazepam Rash   Penicillins Itching   Rofecoxib Swelling   Latex Itching    When used gloves in the past   Prozac [Fluoxetine Hcl] Itching and Rash   Zinc Oxide [Mexsana] Rash      OBJECTIVE:  Physical Exam  Vitals:   11/15/20 1303  BP: 134/62  Pulse: (!) 49  Weight: 255 lb (115.7 kg)  Height: '5\' 6"'$  (1.676 m)   Body mass index is 41.16 kg/m. No results found.  Post stroke PHQ 2/9 Depression screen PHQ 2/9 11/15/2020  Decreased Interest 0  Down, Depressed, Hopeless 0   PHQ - 2 Score 0     General: well developed, well nourished, pleasant elderly Caucasian female, seated, in no evident distress Head: head normocephalic and atraumatic.   Neck: supple with no carotid or supraclavicular bruits Cardiovascular: regular rate and rhythm, no murmurs Musculoskeletal: no deformity Skin:  no rash/petichiae Vascular:  Normal pulses all extremities   Neurologic Exam Mental Status: Awake and fully alert.  Mild expressive aphasia with occasional word finding difficulty.  Oriented to place and time. Recent memory subjectively impaired and remote memory intact. Attention span, concentration and fund of knowledge appropriate during visit. Mood and affect appropriate.  Cranial Nerves: Fundoscopic exam reveals sharp disc margins. Pupils equal, briskly reactive to light. Extraocular movements full without nystagmus. Visual fields full to confrontation. Hearing intact. Facial sensation intact. Face, tongue, palate moves normally and symmetrically.  Motor: Normal bulk and tone. Normal strength in all tested extremity muscles Sensory.: intact to touch , pinprick , position and vibratory sensation.  Coordination: Rapid alternating movements normal in all extremities. Finger-to-nose and heel-to-shin performed accurately bilaterally. Gait and Station: Arises from chair without difficulty. Stance is normal. Gait demonstrates normal stride length and balance without use of assistive device.  Reflexes: 1+ and symmetric. Toes downgoing.     NIHSS  1 Modified Rankin  2      ASSESSMENT: Morgan Dixon is a 70 y.o. year old female with recent left posterior MCA infarct with small hemorrhagic conversion on [id number] embolic pattern secondary to unclear source.  Vascular risk factors include HTN, HLD, advanced age and obesity.      PLAN:  Left posterior MCA stroke, cryptogenic:  Residual deficit: Expressive aphasia and cognitive impairment.  Plans to start working with  outpatient PT 8/10 in [place].  Discussed potential of further improvement of both speech and cognition and typical timeframe 30-day cardiac event monitor negative for A. Fib -advised to schedule follow-up visit with her established cardiologist to further discuss placement of loop recorder Continue clopidogrel 75 mg daily  and atorvastatin 40 mg daily for secondary stroke prevention.   Discussed secondary stroke prevention measures and importance of close PCP follow up for aggressive stroke risk factor  management  HTN: BP goal <130/90.  Stable on current regimen per PCP HLD: LDL goal <70. Recent LDL 88 -continue atorvastatin 40 mg daily.     Follow up in 4 months or call earlier if needed   CC:  [facility] provider: Dr. Leonie Man PCP: Zhou-Talbert, [name], MD    I spent 56 minutes of face-to-face and non-face-to-face time with patient and daughter.  This included previsit chart review including review of recent hospitalization, lab review, study review, electronic health record documentation, patient and daughter education regarding recent stroke including potential etiologies, secondary stroke prevention measures and aggressive stroke risk factor management, residual deficits with typical recovery time and answered all questions to patient and daughters satisfaction     Frann Rider, AGNP-BC  [facility] [address] [place], [place] [address]  Phone [phone] Fax [phone] Note: This document was prepared with digital dictation and possible smart phrase technology. Any transcriptional errors that result from this process are unintentional.

## 2020-11-17 NOTE — Progress Notes (Signed)
I agree with the above plan 

## 2020-11-23 ENCOUNTER — Encounter (HOSPITAL_COMMUNITY): Payer: Self-pay | Admitting: Speech Pathology

## 2020-11-23 ENCOUNTER — Ambulatory Visit (HOSPITAL_COMMUNITY): Payer: Medicare HMO | Attending: Family Medicine | Admitting: Speech Pathology

## 2020-11-23 ENCOUNTER — Other Ambulatory Visit: Payer: Self-pay

## 2020-11-23 DIAGNOSIS — R41841 Cognitive communication deficit: Secondary | ICD-10-CM | POA: Insufficient documentation

## 2020-11-23 DIAGNOSIS — R4701 Aphasia: Secondary | ICD-10-CM | POA: Insufficient documentation

## 2020-11-23 NOTE — Therapy (Signed)
Morgan Dixon, [place], [address] Phone: [phone]   Fax:  [phone]  Speech Language Pathology Evaluation  Patient Details  Name: Morgan Dixon MRN: [id number] Date of Birth: 04/25/1950 Referring Provider (SLP): Morgan Dixon   Encounter Date: 11/23/2020   End of Session - 11/23/20 1217     Visit Number 1    Number of Visits 5    Date for SLP Re-Evaluation 12/29/20    Authorization Type [employer]   Member covered at 100%  [employer] Mcr hmo eff 01/0/12022  no ded  $35.00 co-pay  oop max $4,500.00/met  no visit limit  no auth req  no co-ins   SLP Start Time 0950    SLP Stop Time  1035    SLP Time Calculation (min) 45 min    Activity Tolerance Patient tolerated treatment well             Past Medical History:  Diagnosis Date   Chronic pain    Edema extremities     Past Surgical History:  Procedure Laterality Date   ABDOMINAL HYSTERECTOMY  1989   BACK SURGERY     GASTRIC BYPASS      There were no vitals filed for this visit.   Subjective Assessment - 11/23/20 1007     Subjective "I have trouble getting my words out."    Currently in Pain? No/denies                SLP Evaluation [facility] - 11/23/20 1007       SLP Visit Information   SLP Received On 11/23/20    Referring Provider (SLP) Morgan Dixon, Morgan Dixon    Onset Date 08/17/2004    Medical Diagnosis s/p CVA      General Information   HPI [name] is a 70 y.o. female with history of chronic back pain/chronic pain, hypertension, status post prior gastric bypass surgery, and s/p spinal stimulator implant who presented to [facility] on 08/17/2020 with AMS and speech difficulties and was transferred to [facility]. She was admitted for work-up of acute metabolic encephalopathy with CT head showing left parietal temporal lobe hypodensity initially concerning for mass but repeat CT head with contrast more likely subacute  stroke in left parietal area rather than mass.  Unable to complete MRI due to spinal stimulator.  Infarct embolic secondary to unknown source and recommended 30-day cardiac event monitor vs ILR to evaluate for A. fib. Stroke team signed off on 5/9 but reevaluated on 5/19 with patient complaining of headache and repeat CT head showed small focus of hemorrhagic conversion with repeat imaging the following day showing stable appearance. Therapies initially recommended SNF but due to insurance denial, she was discharged home with her daughter with home health therapies.  Discharged home on 09/05/2020. Pt stated she received [facility] SLP services, but did not find it especially helpful in regards to her expressive language deficits. She was referred for OP SLP therapy by [facility] Dixon to address residual expressive speech and language deficits.    Behavioral/Cognition alert and cooperative    Mobility Status ambulatory      Balance Screen   Has the patient fallen in the past 6 months No    Has the patient had a decrease in activity level because of a fear of falling?  No    Is the patient reluctant to leave their home because of a fear of falling?  No  Prior Functional Status   Cognitive/Linguistic Baseline Within functional limits    Type of Home House     Lives With Alone;Other (Comment)    Available Support Family    Education 2 years college    Vocation Retired      Associate Professor   Overall Cognitive Status Within Functional Limits for tasks assessed    Memory Appears intact   4/5 recall after delay   Awareness Appears intact    Problem Solving Appears intact    Neurosurgeon complex      Auditory Comprehension   Overall Auditory Comprehension Appears within functional limits for tasks assessed    Yes/No Questions Within Functional Limits    Commands Within Functional Limits    Conversation Complex      Visual  Recognition/Discrimination   Discrimination Within Function Limits      Reading Comprehension   Reading Status Within funtional limits      Expression   Primary Mode of Expression Verbal      Verbal Expression   Overall Verbal Expression Impaired    Initiation No impairment    Automatic Speech Name;Month of year;Social Response    Level of Generative/Spontaneous Verbalization Conversation    Repetition Impaired    Level of Impairment Sentence level    Naming Impairment    Responsive 76-100% accurate    Confrontation 75-100% accurate   15/15 BNT short form   Convergent Not tested    Divergent 75-100% accurate    Pragmatics No impairment    Effective Techniques Semantic cues    Non-Verbal Means of Communication Not applicable      Written Expression   Dominant Hand Right    Written Expression Within Functional Limits      Oral Motor/Sensory Function   Overall Oral Motor/Sensory Function Appears within functional limits for tasks assessed      Motor Speech   Overall Motor Speech Appears within functional limits for tasks assessed    Respiration Within functional limits    Phonation Normal    Resonance Within functional limits    Articulation Within functional limitis    Intelligibility Intelligible    Motor Planning Witnin functional limits    Motor Speech Errors Not applicable    Phonation WFL      Standardized Assessments   Standardized Assessments  --   SLUMS 28/30              SLP Short Term Goals - 11/23/20 1219       SLP SHORT TERM GOAL #1   Title Pt will implement word-finding strategies with 95% accuracy when unable to verbalize desired word in conversation/functional tasks with min assist.    Baseline Pt reports difficulty with word finding in conversations and becomes frustrated.    Time 4    Period Weeks    Status New    Target Date 12/22/20      SLP SHORT TERM GOAL #2   Title Pt will complete high-level word retrieval tasks with 95% acc and  min assist.    Baseline 90%    Time 4    Period Weeks    Status New    Target Date 12/22/20      SLP SHORT TERM GOAL #3   Title Pt will write moderately complex numbers to dictation with 100% acc with cue for error awareness.    Baseline 90%, takes longer    Time 4  Period Weeks    Status New    Target Date 12/22/20              SLP Long Term Goals - 11/24/20 1602       SLP LONG TERM GOAL #1   Title Same as short              Plan - 11/23/20 1218     Clinical Impression Statement Pt presents with mild expressive speech and language deficits characterized by reports of impaired word finding in conversations and Pt slower to express complex thoughts. In addition, she transposes numbers when writing them down. She reports that she has made good progress since her stroke, but still is frustrated by her inefficiency. She scored a 15/15 on the [place] short form and 28/30 on the SLUMS (error for 4/5 word recall after delay and attention). Pt will benefit from skilled SLP in order to address the above impairments, maximize independence, and decrease burden of care     Speech Therapy Frequency 1x /week    Duration 4 weeks    Treatment/Interventions Language facilitation;Cueing hierarchy;SLP instruction and feedback;Compensatory techniques;Compensatory strategies;Internal/external aids    Potential to Achieve Goals Good    SLP Home Exercise Plan Pt will completed HEP as assigned to facilitate carryover of treatment strategies and techniques in home environment with use of written cues as needed.    Consulted and Agree with Plan of Care Patient             Patient will benefit from skilled therapeutic intervention in order to improve the following deficits and impairments:   Cognitive communication deficit  Aphasia    Problem List Patient Active Problem List   Diagnosis Date Noted   Obesity 08/27/2020   Benign essential HTN 08/27/2020   CVA (cerebral  vascular accident) ([place]) 86/85/4883   Acute metabolic encephalopathy 01/41/5973   Acute lower UTI 08/17/2020   Hypokalemia 08/17/2020   Left renal mass 08/17/2020   Bilateral lower extremity edema 12/12/2017   Pain in both lower extremities 12/12/2017   Thank you,  Genene Churn, [place]  [place]ana Kilmartin 11/24/2020, 4:02 PM  [place], [place], [address] Phone: [phone]   Fax:  [phone]  Name: Morgan Dixon MRN: [id number] Date of Birth: 1950-12-15

## 2020-12-01 ENCOUNTER — Ambulatory Visit (HOSPITAL_COMMUNITY): Payer: Medicare HMO | Admitting: Speech Pathology

## 2020-12-01 ENCOUNTER — Other Ambulatory Visit: Payer: Self-pay | Admitting: Family Medicine

## 2020-12-01 DIAGNOSIS — N2889 Other specified disorders of kidney and ureter: Secondary | ICD-10-CM

## 2020-12-06 ENCOUNTER — Encounter (HOSPITAL_COMMUNITY): Payer: Self-pay | Admitting: Speech Pathology

## 2020-12-06 ENCOUNTER — Other Ambulatory Visit: Payer: Self-pay

## 2020-12-06 ENCOUNTER — Other Ambulatory Visit: Payer: Self-pay | Admitting: Family Medicine

## 2020-12-06 ENCOUNTER — Ambulatory Visit (HOSPITAL_COMMUNITY): Payer: Medicare HMO | Admitting: Speech Pathology

## 2020-12-06 DIAGNOSIS — R41841 Cognitive communication deficit: Secondary | ICD-10-CM | POA: Diagnosis not present

## 2020-12-06 DIAGNOSIS — R4701 Aphasia: Secondary | ICD-10-CM

## 2020-12-06 DIAGNOSIS — Z1231 Encounter for screening mammogram for malignant neoplasm of breast: Secondary | ICD-10-CM

## 2020-12-06 NOTE — Therapy (Signed)
[place], [place], [address] Phone: [phone]   Fax:  [phone]  Speech Language Pathology Treatment  Patient Details  Name: Morgan Dixon MRN: [id number] Date of Birth: 25-Sep-1950 Referring Provider (SLP): Curtis Sites   Encounter Date: 12/06/2020   End of Session - 12/06/20 1623     Visit Number 2    Number of Visits 5    Date for SLP Re-Evaluation 12/29/20    Authorization Type [employer] Medicare   Member covered at 100%  [employer] Mcr hmo eff 01/0/12022  no ded  $35.00 co-pay  oop max $4,500.00/met  no visit limit  no auth req  no co-ins   SLP Start Time 1600    SLP Stop Time  1645    SLP Time Calculation (min) 45 min    Activity Tolerance Patient tolerated treatment well             Past Medical History:  Diagnosis Date   Chronic pain    Edema extremities     Past Surgical History:  Procedure Laterality Date   ABDOMINAL HYSTERECTOMY  1989   BACK SURGERY     GASTRIC BYPASS      There were no vitals filed for this visit.   Subjective Assessment - 12/06/20 1612     Subjective "I don't know that I need to be coming here."    Currently in Pain? No/denies              ADULT SLP TREATMENT - 12/06/20 1618       General Information   Behavior/Cognition Alert;Cooperative;Pleasant mood    Patient Positioning Upright in chair    Oral care provided N/A    HPI [name] is a 70 y.o. female with history of chronic back pain/chronic pain, hypertension, status post prior gastric bypass surgery, and s/p spinal stimulator implant who presented to [facility] ED on 08/17/2020 with AMS and speech difficulties and was transferred to [facility]. She was admitted for work-up of acute metabolic encephalopathy with CT head showing left parietal temporal lobe hypodensity initially concerning for mass but repeat CT head with contrast more likely subacute stroke in left parietal area rather  than mass.  Unable to complete MRI due to spinal stimulator.  Infarct embolic secondary to unknown source and recommended 30-day cardiac event monitor vs ILR to evaluate for A. fib. Stroke team signed off on 5/9 but reevaluated on 5/19 with patient complaining of headache and repeat CT head showed small focus of hemorrhagic conversion with repeat imaging the following day showing stable appearance. Therapies initially recommended SNF but due to insurance denial, she was discharged home with her daughter with home health therapies.  Discharged home on 09/05/2020. Pt stated she received [facility] SLP services, but did not find it especially helpful in regards to her expressive language deficits. She was referred for OP SLP therapy by Curtis Sites to address residual expressive speech and language deficits.      Treatment Provided   Treatment provided Cognitive-Linquistic      Pain Assessment   Pain Assessment No/denies pain      Cognitive-Linquistic Treatment   Treatment focused on Aphasia;Patient/family/caregiver education    Skilled Treatment SLP provided skilled treatment targeting expressive language skills and writing numbers to dictation during word description, naming to description, and menu math activities.      Assessment / Recommendations / Plan   Plan Continue with current plan of care  Progression Toward Goals   Progression toward goals Progressing toward goals              SLP Education - 12/06/20 1701     Education Details Assigned math calculations for [employer]) Educated Patient    Methods Handout    Comprehension Verbalized understanding              SLP Short Term Goals - 12/06/20 1623       SLP SHORT TERM GOAL #1   Title Pt will implement word-finding strategies with 95% accuracy when unable to verbalize desired word in conversation/functional tasks with min assist.    Baseline Pt reports difficulty with word finding in conversations and becomes  frustrated.    Time 4    Period Weeks    Status On-going    Target Date 12/22/20      SLP SHORT TERM GOAL #2   Title Pt will complete high-level word retrieval tasks with 95% acc and min assist.    Baseline 90%    Time 4    Period Weeks    Status On-going    Target Date 12/22/20      SLP SHORT TERM GOAL #3   Title Pt will write moderately complex numbers to dictation with 100% acc with cue for error awareness.    Baseline 90%, takes longer    Time 4    Period Weeks    Status On-going    Target Date 12/22/20              SLP Long Term Goals - 12/06/20 1624       SLP LONG TERM GOAL #1   Title Same as short              Plan - 12/06/20 1623     Clinical Impression Statement Pt initially questioned whether she needed to be coming for therapy, as she reported feeling much improved. SLP explained that she certainly did not have to come therapy if she felt like she didn't need it. In session, she completed moderately complex expressive language tasks (naming to description and providing verbal description using salient features) with 95% acc and indirect cues. She wrote phone numbers to dictation with 95% acc and was able to self correct errors with independent request for repetition. She completed subtraction and addition problems with 90% acc and will take the rest home for homework. Pt would like to return for at least one more treatment session. Continue to target goals next session and check homework.    Speech Therapy Frequency 1x /week    Treatment/Interventions Language facilitation;Cueing hierarchy;SLP instruction and feedback;Compensatory techniques;Compensatory strategies;Internal/external aids    Potential to Achieve Goals Good    SLP Home Exercise Plan Pt will completed HEP as assigned to facilitate carryover of treatment strategies and techniques in home environment with use of written cues as needed.    Consulted and Agree with Plan of Care Patient              Patient will benefit from skilled therapeutic intervention in order to improve the following deficits and impairments:   Cognitive communication deficit  Aphasia    Problem List Patient Active Problem List   Diagnosis Date Noted   Obesity 08/27/2020   Benign essential HTN 08/27/2020   CVA (cerebral vascular accident) ([place]) 50/53/9767   Acute metabolic encephalopathy 34/19/3790   Acute lower UTI 08/17/2020   Hypokalemia 08/17/2020   Left renal mass 08/17/2020  Bilateral lower extremity edema 12/12/2017   Pain in both lower extremities 12/12/2017   Thank you,  Genene Churn, [place]  [facility] 12/06/2020, 5:05 PM  [place] [address] [place], [place], [address] Phone: [phone]   Fax:  [phone]   Name: Morgan Dixon MRN: [id number] Date of Birth: [birth year]

## 2020-12-13 ENCOUNTER — Telehealth (HOSPITAL_COMMUNITY): Payer: Self-pay | Admitting: Speech Pathology

## 2020-12-13 ENCOUNTER — Ambulatory Visit (HOSPITAL_COMMUNITY): Payer: Medicare HMO | Admitting: Speech Pathology

## 2020-12-13 NOTE — Telephone Encounter (Signed)
SLP phoned Pt due to not showing for today's 2:30 appointment, however Pt stated that she called yesterday to cancel the appointment due to feeling dizzy. She plans to attend her next appointment on September 6.  Thank you,  Genene Churn, CCC-SLP [phone]

## 2020-12-15 ENCOUNTER — Ambulatory Visit: Admission: RE | Admit: 2020-12-15 | Payer: Medicare HMO | Source: Ambulatory Visit

## 2020-12-20 ENCOUNTER — Other Ambulatory Visit: Payer: Self-pay

## 2020-12-20 ENCOUNTER — Ambulatory Visit (HOSPITAL_COMMUNITY): Payer: Medicare HMO | Attending: Family Medicine | Admitting: Speech Pathology

## 2020-12-20 DIAGNOSIS — R41841 Cognitive communication deficit: Secondary | ICD-10-CM | POA: Insufficient documentation

## 2020-12-20 DIAGNOSIS — R4701 Aphasia: Secondary | ICD-10-CM | POA: Diagnosis present

## 2020-12-20 NOTE — Therapy (Signed)
[place], [place], [address] Phone: [phone]   Fax:  [phone]  Speech Language Pathology Treatment  Patient Details  Name: Morgan Dixon MRN: [id number] Date of Birth: [birth year] Referring Provider (SLP): Curtis Sites   Encounter Date: 12/20/2020   End of Session - 12/20/20 1543     Visit Number 3    Number of Visits 5    Date for SLP Re-Evaluation 12/29/20    Authorization Type [employer] Medicare   Member covered at 100%  [employer] Mcr hmo eff 01/0/12022  no ded  $35.00 co-pay  oop max $4,500.00/met  no visit limit  no auth req  no co-ins   SLP Stop Time  1526    Activity Tolerance Patient tolerated treatment well             Past Medical History:  Diagnosis Date   Chronic pain    Edema extremities     Past Surgical History:  Procedure Laterality Date   ABDOMINAL HYSTERECTOMY  1989   BACK SURGERY     GASTRIC BYPASS      There were no vitals filed for this visit.   Subjective Assessment - 12/20/20 1533     Subjective "I am doing better all the time."    Currently in Pain? No/denies               ADULT SLP TREATMENT - 12/20/20 1538       General Information   Behavior/Cognition Alert;Cooperative;Pleasant mood    Patient Positioning Upright in chair    Oral care provided N/A    HPI [name] is a 70 y.o. female with history of chronic back pain/chronic pain, hypertension, status post prior gastric bypass surgery, and s/p spinal stimulator implant who presented to [facility] ED on 08/17/2020 with AMS and speech difficulties and was transferred to [facility]. She was admitted for work-up of acute metabolic encephalopathy with CT head showing left parietal temporal lobe hypodensity initially concerning for mass but repeat CT head with contrast more likely subacute stroke in left parietal area rather than mass.  Unable to complete MRI due to spinal stimulator.  Infarct  embolic secondary to unknown source and recommended 30-day cardiac event monitor vs ILR to evaluate for A. fib. Stroke team signed off on 5/9 but reevaluated on 5/19 with patient complaining of headache and repeat CT head showed small focus of hemorrhagic conversion with repeat imaging the following day showing stable appearance. Therapies initially recommended SNF but due to insurance denial, she was discharged home with her daughter with home health therapies.  Discharged home on 09/05/2020. Pt stated she received [facility] SLP services, but did not find it especially helpful in regards to her expressive language deficits. She was referred for OP SLP therapy by Curtis Sites to address residual expressive speech and language deficits.      Treatment Provided   Treatment provided Cognitive-Linquistic      Pain Assessment   Pain Assessment No/denies pain      Cognitive-Linquistic Treatment   Treatment focused on Aphasia;Patient/family/caregiver education    Skilled Treatment SLP provided skilled treatment targeting expressive language skills and writing numbers to dictation during word description, naming to description, and menu math activities.                SLP Short Term Goals - 12/20/20 1543       SLP SHORT TERM GOAL #1   Title Pt will  implement word-finding strategies with 95% accuracy when unable to verbalize desired word in conversation/functional tasks with min assist.    Baseline Pt reports difficulty with word finding in conversations and becomes frustrated.    Time 4    Period Weeks    Status On-going    Target Date 12/22/20      SLP SHORT TERM GOAL #2   Title Pt will complete high-level word retrieval tasks with 95% acc and min assist.    Baseline 90%    Time 4    Period Weeks    Status On-going    Target Date 12/22/20      SLP SHORT TERM GOAL #3   Title Pt will write moderately complex numbers to dictation with 100% acc with cue for error awareness.    Baseline  90%, takes longer    Time 4    Period Weeks    Status On-going    Target Date 12/22/20              SLP Long Term Goals - 12/20/20 1544       SLP LONG TERM GOAL #1   Title Same as short              Plan - 12/20/20 1543     Clinical Impression Statement Pt continues to report improvement in her expressive language skills. She completed all the of written math problems with 100% acc. She only completed about 50% of the synonym and antonym task for homework, so we addressed in session. She completed antonym task with 95% acc with min cues and was able to create a sentence with each word with min assist. She completed a complex planning activity with 90% acc independently. Continue to target goals next session and check homework.    Speech Therapy Frequency 1x /week    Treatment/Interventions Language facilitation;Cueing hierarchy;SLP instruction and feedback;Compensatory techniques;Compensatory strategies;Internal/external aids    Potential to Achieve Goals Good    SLP Home Exercise Plan Pt will completed HEP as assigned to facilitate carryover of treatment strategies and techniques in home environment with use of written cues as needed.    Consulted and Agree with Plan of Care Patient             Patient will benefit from skilled therapeutic intervention in order to improve the following deficits and impairments:   Cognitive communication deficit  Aphasia    Problem List Patient Active Problem List   Diagnosis Date Noted   Obesity 08/27/2020   Benign essential HTN 08/27/2020   CVA (cerebral vascular accident) ([place]) 35/68/6168   Acute metabolic encephalopathy 37/29/0211   Acute lower UTI 08/17/2020   Hypokalemia 08/17/2020   Left renal mass 08/17/2020   Bilateral lower extremity edema 12/12/2017   Pain in both lower extremities 12/12/2017   Thank you,  Genene Churn, [place]  [place] 12/20/2020, 3:44 PM  [place] [address] [place], [place], [address] Phone: [phone]   Fax:  [phone]   Name: Morgan Dixon MRN: [id number] Date of Birth: 31-Mar-1951

## 2020-12-28 ENCOUNTER — Ambulatory Visit (HOSPITAL_COMMUNITY): Payer: Medicare HMO | Admitting: Speech Pathology

## 2020-12-28 ENCOUNTER — Encounter (HOSPITAL_COMMUNITY): Payer: Self-pay | Admitting: Speech Pathology

## 2020-12-28 ENCOUNTER — Other Ambulatory Visit: Payer: Self-pay

## 2020-12-28 DIAGNOSIS — R41841 Cognitive communication deficit: Secondary | ICD-10-CM

## 2020-12-28 DIAGNOSIS — R4701 Aphasia: Secondary | ICD-10-CM

## 2020-12-28 NOTE — Therapy (Signed)
[place], [place], [address] Phone: [phone]   Fax:  [phone]  Speech Language Pathology Treatment  Patient Details  Name: Morgan Dixon MRN: [id number] Date of Birth: [name] 02, 1951 Referring Provider (SLP): Curtis Sites   Encounter Date: 12/28/2020   End of Session - 12/28/20 1101     Visit Number 4    Number of Visits 5    Date for SLP Re-Evaluation 12/29/20    Authorization Type [employer] Medicare   Member covered at 100%  [employer] Mcr hmo eff 01/0/12022  no ded  $35.00 co-pay  oop max $4,500.00/met  no visit limit  no auth req  no co-ins   SLP Start Time 1030    SLP Stop Time  1115    SLP Time Calculation (min) 45 min    Activity Tolerance Patient tolerated treatment well             Past Medical History:  Diagnosis Date   Chronic pain    Edema extremities     Past Surgical History:  Procedure Laterality Date   ABDOMINAL HYSTERECTOMY  1989   BACK SURGERY     GASTRIC BYPASS      There were no vitals filed for this visit.   Subjective Assessment - 12/28/20 1040     Subjective "Sometimes I still mix up numbers."    Currently in Pain? No/denies               ADULT SLP TREATMENT - 12/28/20 1046       General Information   Behavior/Cognition Alert;Cooperative;Pleasant mood    Patient Positioning Upright in chair    Oral care provided N/A    HPI [name] is a 70 y.o. female with history of chronic back pain/chronic pain, hypertension, status post prior gastric bypass surgery, and s/p spinal stimulator implant who presented to [facility] ED on 08/17/2020 with AMS and speech difficulties and was transferred to [facility]. She was admitted for work-up of acute metabolic encephalopathy with CT head showing left parietal temporal lobe hypodensity initially concerning for mass but repeat CT head with contrast more likely subacute stroke in left parietal area rather than mass.   Unable to complete MRI due to spinal stimulator.  Infarct embolic secondary to unknown source and recommended 30-day cardiac event monitor vs ILR to evaluate for A. fib. Stroke team signed off on 5/9 but reevaluated on 5/19 with patient complaining of headache and repeat CT head showed small focus of hemorrhagic conversion with repeat imaging the following day showing stable appearance. Therapies initially recommended SNF but due to insurance denial, she was discharged home with her daughter with home health therapies.  Discharged home on 09/05/2020. Pt stated she received [facility] SLP services, but did not find it especially helpful in regards to her expressive language deficits. She was referred for OP SLP therapy by Curtis Sites to address residual expressive speech and language deficits.      Treatment Provided   Treatment provided Cognitive-Linquistic      Pain Assessment   Pain Assessment No/denies pain      Cognitive-Linquistic Treatment   Treatment focused on Aphasia;Patient/family/caregiver education    Skilled Treatment SLP provided skilled treatment targeting expressive language skills during word description (Taboo) and naming to description tasks. Pt also wrote numbers to dictation and cued to use verbal mediation and error awareness strategies to check work.      Assessment / Recommendations / Plan  Plan All goals met;Discharge SLP treatment due to (comment)      Progression Toward Goals   Progression toward goals Goals met, education completed, patient discharged from [place] - 12/28/20 [place] #1   Title Pt will implement word-finding strategies with 95% accuracy when unable to verbalize desired word in conversation/functional tasks with min assist.    Baseline Pt reports difficulty with word finding in conversations and becomes frustrated.    Time 4    Period Weeks    Status Achieved    Target Date 12/29/20       SLP SHORT TERM GOAL #2   Title Pt will complete high-level word retrieval tasks with 95% acc and min assist.    Baseline 90%    Time 4    Period Weeks    Status Achieved    Target Date 12/29/20      SLP SHORT TERM GOAL #3   Title Pt will write moderately complex numbers to dictation with 100% acc with cue for error awareness.    Baseline 90%, takes longer    Time 4    Period Weeks    Status Achieved    Target Date 12/29/20              SLP Long Term Goals - 12/28/20 1103       SLP LONG TERM GOAL #1   Title Same as short              Plan - 12/28/20 1102     Clinical Impression Statement Pt reports continued improvement across all domains, but occasionally transposes numbers. She uses verbal mediation strategies and also requests speaker to say the number twice to check her work. In session, she completed complex verbal description task with 100% acc with min cues provided and named to description with 100% acc with indirect cues needed. Pt reports feeling pleased with her improvement and is ready to be discharged from therapy. SLP recommended that she continue to stimulate her brain with crossword puzzles, reading, and daily conversations with friends and family even if only able to do on the phone. Will d/c from therapy.    Treatment/Interventions Language facilitation;Cueing hierarchy;SLP instruction and feedback;Compensatory techniques;Compensatory strategies;Internal/external aids    Potential to Achieve Goals Good    SLP Home Exercise Plan Pt will completed HEP as assigned to facilitate carryover of treatment strategies and techniques in home environment with use of written cues as needed.    Consulted and Agree with Plan of Care Patient             Patient will benefit from skilled therapeutic intervention in order to improve the following deficits and impairments:   Cognitive communication deficit  Aphasia    Problem List Patient Active Problem  List   Diagnosis Date Noted   Obesity 08/27/2020   Benign essential HTN 08/27/2020   CVA (cerebral vascular accident) ([place]) 16/01/9603   Acute metabolic encephalopathy 54/12/8117   Acute lower UTI 08/17/2020   Hypokalemia 08/17/2020   Left renal mass 08/17/2020   Bilateral lower extremity edema 12/12/2017   Pain in both lower extremities 12/12/2017   SPEECH THERAPY DISCHARGE SUMMARY  Visits from Start of Care: 4  Current functional level related to goals / functional outcomes: Goals met. See above   Remaining deficits: Mild expressive language deficits  Education / Equipment: Completed and HEP provided   Patient agrees to discharge. Patient goals were met. Patient is being discharged due to being pleased with the current functional level.Lujean Rave you,  Genene Churn, [place]  [facility] 12/28/2020, 11:34 AM  [place], [place], [address] Phone: [phone]   Fax:  [phone]   Name: DEIRDRA HEUMANN MRN: [id number] Date of Birth: 12/20/1950

## 2021-01-12 ENCOUNTER — Other Ambulatory Visit: Payer: Self-pay

## 2021-01-12 ENCOUNTER — Emergency Department
Admission: EM | Admit: 2021-01-12 | Discharge: 2021-01-12 | Disposition: A | Discharge status: Home / Self Care | Payer: Medicare HMO | Attending: Emergency Medicine | Admitting: Emergency Medicine

## 2021-01-12 ENCOUNTER — Emergency Department: Payer: Medicare HMO

## 2021-01-12 DIAGNOSIS — I10 Essential (primary) hypertension: Secondary | ICD-10-CM | POA: Insufficient documentation

## 2021-01-12 DIAGNOSIS — Z20822 Contact with and (suspected) exposure to covid-19: Secondary | ICD-10-CM | POA: Insufficient documentation

## 2021-01-12 DIAGNOSIS — I6930 Unspecified sequelae of cerebral infarction: Secondary | ICD-10-CM

## 2021-01-12 DIAGNOSIS — R600 Localized edema: Secondary | ICD-10-CM | POA: Diagnosis not present

## 2021-01-12 DIAGNOSIS — Z91040 Latex allergy status: Secondary | ICD-10-CM | POA: Insufficient documentation

## 2021-01-12 DIAGNOSIS — R42 Dizziness and giddiness: Secondary | ICD-10-CM

## 2021-01-12 DIAGNOSIS — R531 Weakness: Secondary | ICD-10-CM | POA: Diagnosis present

## 2021-01-12 DIAGNOSIS — D649 Anemia, unspecified: Secondary | ICD-10-CM

## 2021-01-12 DIAGNOSIS — Z79899 Other long term (current) drug therapy: Secondary | ICD-10-CM | POA: Diagnosis not present

## 2021-01-12 DIAGNOSIS — R001 Bradycardia, unspecified: Secondary | ICD-10-CM

## 2021-01-12 LAB — CBC WITH DIFFERENTIAL/PLATELET
Abs Immature Granulocytes: 0.02 10*3/uL (ref 0.00–0.07)
Basophils Absolute: 0 10*3/uL (ref 0.0–0.1)
Basophils Relative: 0 %
Eosinophils Absolute: 0.1 10*3/uL (ref 0.0–0.5)
Eosinophils Relative: 2 %
HCT: 26.9 % — ABNORMAL LOW (ref 36.0–46.0)
Hemoglobin: 8.5 g/dL — ABNORMAL LOW (ref 12.0–15.0)
Immature Granulocytes: 0 %
Lymphocytes Relative: 19 %
Lymphs Abs: 0.9 10*3/uL (ref 0.7–4.0)
MCH: 26.4 pg (ref 26.0–34.0)
MCHC: 31.6 g/dL (ref 30.0–36.0)
MCV: 83.5 fL (ref 80.0–100.0)
Monocytes Absolute: 0.2 10*3/uL (ref 0.1–1.0)
Monocytes Relative: 5 %
Neutro Abs: 3.3 10*3/uL (ref 1.7–7.7)
Neutrophils Relative %: 74 %
Platelets: 147 10*3/uL — ABNORMAL LOW (ref 150–400)
RBC: 3.22 MIL/uL — ABNORMAL LOW (ref 3.87–5.11)
RDW: 16.2 % — ABNORMAL HIGH (ref 11.5–15.5)
WBC: 4.6 10*3/uL (ref 4.0–10.5)
nRBC: 0 % (ref 0.0–0.2)

## 2021-01-12 LAB — COMPREHENSIVE METABOLIC PANEL
ALT: 14 U/L (ref 0–44)
AST: 21 U/L (ref 15–41)
Albumin: 3.7 g/dL (ref 3.5–5.0)
Alkaline Phosphatase: 77 U/L (ref 38–126)
Anion gap: 12 (ref 5–15)
BUN: 25 mg/dL — ABNORMAL HIGH (ref 8–23)
CO2: 28 mmol/L (ref 22–32)
Calcium: 8.9 mg/dL (ref 8.9–10.3)
Chloride: 97 mmol/L — ABNORMAL LOW (ref 98–111)
Creatinine, Ser: 0.99 mg/dL (ref 0.44–1.00)
GFR, Estimated: >60 mL/min (ref >60)
Glucose, Bld: 107 mg/dL — ABNORMAL HIGH (ref 70–99)
Potassium: 3.7 mmol/L (ref 3.5–5.1)
Sodium: 137 mmol/L (ref 135–145)
Total Bilirubin: 1.5 mg/dL — ABNORMAL HIGH (ref 0.3–1.2)
Total Protein: 7.3 g/dL (ref 6.5–8.1)

## 2021-01-12 LAB — TROPONIN I (HIGH SENSITIVITY)
Troponin I (High Sensitivity): 6 ng/L (ref <18)
Troponin I (High Sensitivity): 6 ng/L (ref <18)

## 2021-01-12 LAB — RESP PANEL BY RT-PCR (FLU A&B, COVID) ARPGX2
Influenza A by PCR: NEGATIVE
Influenza B by PCR: NEGATIVE
SARS Coronavirus 2 by RT PCR: NEGATIVE

## 2021-01-12 LAB — TSH: TSH: 1.71 u[IU]/mL (ref 0.350–4.500)

## 2021-01-12 LAB — T4, FREE: Free T4: 0.69 ng/dL (ref 0.61–1.12)

## 2021-01-12 MED ORDER — MECLIZINE HCL 25 MG PO TABS: 25.0000 mg | ORAL_TABLET | Freq: Two times a day (BID) | ORAL | 0 refills | Status: AC | PRN

## 2021-01-12 NOTE — ED Triage Notes (Signed)
Pt here via [facility] from home with weakness. Pt had a stroke back in May and states that 2 days ago her speech and balance has gotten worse. Pt also states that she is stumbling. Speech and waist weakness from stroke in May.

## 2021-01-12 NOTE — ED Provider Notes (Signed)
[facility] Emergency Department Provider Note  ____________________________________________   Event Date/Time   First MD Initiated Contact with Patient 01/12/21 1532     (approximate)  I have reviewed the triage vital signs and the nursing notes.   HISTORY  Chief Complaint Weakness   HPI Morgan Dixon is a 70 y.o. female with a past medical history of 1 chronic bilateral lower extremity edema, obesity, and a CVA with minimal residual deficits including a little bit of weakness in the right leg weakness when she is walking sometimes using assistive a cane who presents for assessment of about a week of some intermittent headache and morning vertigo she is noticed in the last 2 days.  Patient states the vertigo is only in the morning and last couple hours before proceeding.  She currently has no symptoms or vertigo.  She states her headache is actually not bothering her much at the moment.  She denies any vision changes, new weakness, numbness, chest pain, cough, shortness of breath, back pain, abdominal pain, nausea, vomiting or diarrhea, burning with urination, rash or any recent injuries or falls.  Denies any new medication changes.  No prior episodes of vertigo.         Past Medical History:  Diagnosis Date   Chronic pain    Edema extremities     Patient Active Problem List   Diagnosis Date Noted   Obesity 08/27/2020   Benign essential HTN 08/27/2020   CVA (cerebral vascular accident) ([place]) 45/80/9983   Acute metabolic encephalopathy 38/25/0539   Acute lower UTI 08/17/2020   Hypokalemia 08/17/2020   Left renal mass 08/17/2020   Bilateral lower extremity edema 12/12/2017   Pain in both lower extremities 12/12/2017    Past Surgical History:  Procedure Laterality Date   ABDOMINAL HYSTERECTOMY  1989   BACK SURGERY     GASTRIC BYPASS      Prior to Admission medications   Medication Sig Start Date End Date Taking? Authorizing Provider   acetaminophen (TYLENOL) 500 MG tablet Take 2 tablets (1,000 mg total) by mouth every 6 (six) hours as needed for moderate pain. 08/27/20   Debbe Odea, MD  atorvastatin (LIPITOR) 40 MG tablet Take 1 tablet (40 mg total) by mouth daily. 09/05/20 11/15/20  Pokhrel, Corrie Mckusick, MD  buPROPion (WELLBUTRIN SR) 150 MG 12 hr tablet Take 1 tablet (150 mg total) by mouth daily. 09/05/20   Pokhrel, Corrie Mckusick, MD  butalbital-acetaminophen-caffeine (FIORICET) 50-325-40 MG tablet Take 1 tablet by mouth every 6 (six) hours as needed for headache. 09/05/20   Pokhrel, Corrie Mckusick, MD  clopidogrel (PLAVIX) 75 MG tablet Take 1 tablet (75 mg total) by mouth daily with breakfast. 09/05/20 09/05/21  Pokhrel, Corrie Mckusick, MD  hydrochlorothiazide (HYDRODIURIL) 25 MG tablet Take 25 mg by mouth daily.    [provider]  losartan (COZAAR) 25 MG tablet Take 1 tablet (25 mg total) by mouth daily. 09/06/20   Pokhrel, Corrie Mckusick, MD  melatonin 3 MG TABS tablet Take 1 tablet (3 mg total) by mouth at bedtime. 09/05/20   Pokhrel, Corrie Mckusick, MD  Multiple Vitamin (MULTIVITAMIN) tablet Take 1 tablet by mouth daily. Bariatric vitamin    [provider]  ondansetron (ZOFRAN-ODT) 4 MG disintegrating tablet Take 4 mg by mouth every 8 (eight) hours as needed for nausea or vomiting. 08/12/20   [provider]  pantoprazole (PROTONIX) 40 MG tablet Take 40 mg by mouth daily. 07/25/20 07/25/21  [provider]  pregabalin (LYRICA) 100 MG capsule Take 200  mg by mouth 2 (two) times daily. 08/12/20   [provider]  sertraline (ZOLOFT) 25 MG tablet Take 25 mg by mouth at bedtime. 05/18/20   [provider]  tiZANidine (ZANAFLEX) 4 MG tablet Take 4 mg by mouth 3 (three) times daily as needed for muscle spasms.    [provider]  topiramate (TOPAMAX) 25 MG tablet Take 1 tablet (25 mg total) by mouth 2 (two) times daily. 09/05/20   Pokhrel, Corrie Mckusick, MD    Allergies Bupropion, Diazepam, Penicillins, Rofecoxib, Latex,  Prozac [fluoxetine hcl], and Zinc oxide [mexsana]  Family History  Problem Relation Age of Onset   Breast cancer Maternal Aunt 60   Diabetes Father    Stroke Father     Social History Social History   Tobacco Use   Smoking status: Never   Smokeless tobacco: Never  Vaping Use   Vaping Use: Never used  Substance Use Topics   Alcohol use: Never   Drug use: Never    Review of Systems  Review of Systems  Constitutional:  Negative for chills and fever.  HENT:  Negative for sore throat.   Eyes:  Negative for pain.  Respiratory:  Negative for cough and stridor.   Cardiovascular:  Negative for chest pain.  Gastrointestinal:  Negative for vomiting.  Skin:  Negative for rash.  Neurological:  Positive for dizziness and headaches. Negative for seizures and loss of consciousness.  Psychiatric/Behavioral:  Negative for suicidal ideas.   All other systems reviewed and are negative.    ____________________________________________   PHYSICAL EXAM:  VITAL SIGNS: ED Triage Vitals  Enc Vitals Group     BP 01/12/21 1252 (!) 108/55     Pulse Rate 01/12/21 1252 (!) 55     Resp 01/12/21 1252 18     Temp 01/12/21 1252 98.5 F (36.9 C)     Temp Source 01/12/21 1252 Oral     SpO2 01/12/21 1252 100 %     Weight 01/12/21 1253 235 lb (106.6 kg)     Height 01/12/21 1253 5\' 6"  (1.676 m)     Head Circumference --      Peak Flow --      Pain Score 01/12/21 1252 3     Pain Loc --      Pain Edu? --      Excl. in [place]? --    Vitals:   01/12/21 1252  BP: (!) 108/55  Pulse: (!) 55  Resp: 18  Temp: 98.5 F (36.9 C)  SpO2: 100%   Physical Exam Vitals and nursing note reviewed.  Constitutional:      General: She is not in acute distress.    Appearance: She is well-developed. She is obese.  HENT:     Head: Normocephalic and atraumatic.     Right Ear: External ear normal.     Left Ear: External ear normal.     Nose: Nose normal.  Eyes:     Conjunctiva/sclera: Conjunctivae normal.   Cardiovascular:     Rate and Rhythm: Normal rate and regular rhythm.     Heart sounds: No murmur heard. Pulmonary:     Effort: Pulmonary effort is normal. No respiratory distress.     Breath sounds: Normal breath sounds.  Abdominal:     Palpations: Abdomen is soft.     Tenderness: There is no abdominal tenderness.  Musculoskeletal:     Cervical back: Neck supple.     Right lower leg: Edema present.     Left  lower leg: Edema present.  Skin:    General: Skin is warm and dry.  Neurological:     Mental Status: She is alert and oriented to person, place, and time.    Cranial nerves II through XII grossly intact.  No pronator drift.  No finger dysmetria.  Symmetric 5/5 strength of all extremities.  Sensation intact to light touch in all extremities.  Patient has slightly abnormal gait in the right side which she states is chronic from prior stroke.  He otherwise is able to ambulate without assistance.  ____________________________________________   LABS (all labs ordered are listed, but only abnormal results are displayed)  Labs Reviewed  CBC WITH DIFFERENTIAL/PLATELET - Abnormal; Notable for the following components:      Result Value   RBC 3.22 (*)    Hemoglobin 8.5 (*)    HCT 26.9 (*)    RDW 16.2 (*)    Platelets 147 (*)    All other components within normal limits  COMPREHENSIVE METABOLIC PANEL - Abnormal; Notable for the following components:   Chloride 97 (*)    Glucose, Bld 107 (*)    BUN 25 (*)    Total Bilirubin 1.5 (*)    All other components within normal limits  RESP PANEL BY RT-PCR (FLU A&B, COVID) ARPGX2  TSH  T4, FREE  URINALYSIS, ROUTINE W REFLEX MICROSCOPIC  TROPONIN I (HIGH SENSITIVITY)  TROPONIN I (HIGH SENSITIVITY)   ____________________________________________  EKG  Sinus bradycardia with a ventricular rate of 53, normal axis, unremarkable intervals with some artifact in lead I and lead III without other clearance of acute ischemia or significant  arrhythmia.  There are some artifact on your other side. ____________________________________________  RADIOLOGY  ED MD interpretation: CT head shows evidence of prior stroke in left frontal and parietal lobes without evidence of subacute infarct, intracranial hemorrhage or other acute intracranial process.  Official radiology report(s): CT Head Wo Contrast  Result Date: 01/12/2021 CLINICAL DATA:  Dizziness, nonspecific. Additional history provided: Worsening weakness, previous stroke. EXAM: CT HEAD WITHOUT CONTRAST TECHNIQUE: Contiguous axial images were obtained from the base of the skull through the vertex without intravenous contrast. COMPARISON:  Prior head CT examinations 09/01/2020 and earlier. FINDINGS: Brain: Moderate-sized chronic cortical/subcortical infarct within the left frontal and parietal lobes. This infarct was subacute at time of the prior head CT of 09/01/2020. There is no acute intracranial hemorrhage. No demarcated cortical infarct. No extra-axial fluid collection. No evidence of an intracranial mass. No midline shift. Partially empty sella turcica. Vascular: No hyperdense vessel.  Atherosclerotic calcifications. Skull: Normal. Negative for fracture or focal lesion. Sinuses/Orbits: Visualized orbits show no acute finding. Trace left frontal and bilateral ethmoid sinus mucosal thickening. IMPRESSION: No evidence of acute intracranial abnormality. Moderate-sized chronic cortical/subcortical infarct within the left frontal and parietal lobes (left MCA vascular territory). This infarct was subacute at time of the prior head CT on 09/01/2020. Electronically Signed   By: Kellie Simmering D.O.   On: 01/12/2021 14:23    ____________________________________________   PROCEDURES  Procedure(s) performed (including Critical Care):  .1-3 Lead EKG Interpretation Performed by: Lucrezia Starch, MD Authorized by: Lucrezia Starch, MD     Interpretation: non-specific     ECG rate assessment:  bradycardic     Rhythm: sinus bradycardia     Ectopy: none     ____________________________________________   INITIAL IMPRESSION / ASSESSMENT AND PLAN / ED COURSE      Patient presents with above-stated history exam for assessment of about  a week of some intermittent headaches associated with 2 days of some morning vertigo that did not resolve after an hour or 2.  On presentation patient denies any acute symptoms including any vertigo at this time.  Her heart rate is 55 and she is afebrile.  BP is 108/55 and patient states her heart rate lately has been running 90s over 50s and this is normal for her.  Differential includes BPPV versus labyrinthitis versus other likely peripheral etiology.  She has no ongoing symptoms, nystagmus, ataxic gait or other historical or exam features to suggest acute stroke at this time.  EKG and nonelevated troponin x2 are not suggestive of atypical presentation for ACS patient states her heart rate is always in the 50s and I have low suspicion this is related to the transient morning vertigo she has had a last 2 days.  Thyroid function is within normal limits.  COVID influenza test is negative.  CMP shows no significant electrolyte or metabolic derangements.  CBC shows no leukocytosis and hemoglobin 8.5 with seeming baseline between 9.6 and 10.2.  Discussed this with patient recommend she followed up with her PCP all I do believe this is contributing either to her morning vertigo.   CT head shows evidence of prior stroke in left frontal and parietal lobes without evidence of subacute infarct, intracranial hemorrhage or other acute intracranial process.  Given patient is asymptomatic with patient stating repeatedly that vital signs are at baseline and no evidence of acute neurological deficit on exam as well as reassuring work-up I think she is stable for discharge with close outpatient ENT follow-up.  Will write short course of meclizine that she may try at home in  the mornings when she is experiencing any symptoms.  And also advised close outpatient follow-up for her anemia.  Discharged stable condition.  Strict return precautions advised and discussed.     ____________________________________________   FINAL CLINICAL IMPRESSION(S) / ED DIAGNOSES  Final diagnoses:  None    Medications - No data to display   ED Discharge Orders     None        Note:  This document was prepared using Dragon voice recognition software and may include unintentional dictation errors.    Lucrezia Starch, MD 01/12/21 [phone]

## 2021-01-12 NOTE — ED Provider Notes (Addendum)
HPI: Pt is a 70 y.o. female who presents with complaints of  weakness. 2 days ago she had worsening weakness. Stroke previously with some weakness to waist and speech affected per patient.   No falls.   ROS: Denies fever, chest pain, vomiting  Past Medical History:  Diagnosis Date   Chronic pain    Edema extremities    There were no vitals filed for this visit.  Focused Physical Exam: Gen: No acute distress Head: atraumatic, normocephalic Eyes: Extraocular movements grossly intact; conjunctiva clear CV: RRR Lung: No increased WOB, no stridor GI: ND, no obvious masses Neuro: Alert and awake equal strength in arms and legs   Medical Decision Making and Plan: Given the patient's initial medical screening exam, the following diagnostic evaluation has been ordered. The patient will be placed in the appropriate treatment space, once one is available, to complete the evaluation and treatment. I have discussed the plan of care with the patient and I have advised the patient that an ED physician or mid-level practitioner will reevaluate their condition after the test results have been received, as the results may give them additional insight into the type of treatment they may need.   Diagnostics: labs, CT head, not a stroke code given onset > 24 hours.   Treatments: none immediately   Vanessa Crozier, MD 01/12/21 1250    Vanessa [place], MD 01/12/21 1252

## 2021-01-12 NOTE — ED Triage Notes (Signed)
First Nurse Note:  Arrives from home via EMS.  C/O unsteady gait x 2 days.  Hx CVA in April.  VS wnl. Stroke screen negative.  VS wnl.

## 2021-02-22 ENCOUNTER — Other Ambulatory Visit: Payer: Self-pay | Admitting: Family Medicine

## 2021-02-22 DIAGNOSIS — Z1231 Encounter for screening mammogram for malignant neoplasm of breast: Secondary | ICD-10-CM

## 2021-03-22 ENCOUNTER — Ambulatory Visit: Payer: Medicare HMO | Admitting: Adult Health

## 2021-05-01 ENCOUNTER — Other Ambulatory Visit: Payer: Self-pay | Admitting: Neurology

## 2021-05-01 DIAGNOSIS — R413 Other amnesia: Secondary | ICD-10-CM

## 2021-05-11 ENCOUNTER — Other Ambulatory Visit: Payer: Self-pay

## 2021-05-11 ENCOUNTER — Ambulatory Visit
Admission: RE | Admit: 2021-05-11 | Discharge: 2021-05-11 | Disposition: A | Discharge status: Home / Self Care | Payer: Medicare HMO | Source: Ambulatory Visit | Attending: Neurology | Admitting: Neurology

## 2021-05-11 DIAGNOSIS — R413 Other amnesia: Secondary | ICD-10-CM | POA: Insufficient documentation

## 2021-12-15 ENCOUNTER — Emergency Department: Payer: Medicare HMO

## 2021-12-15 ENCOUNTER — Emergency Department
Admission: EM | Admit: 2021-12-15 | Discharge: 2021-12-16 | Disposition: A | Discharge status: Home / Self Care | Payer: Medicare HMO | Attending: Emergency Medicine | Admitting: Emergency Medicine

## 2021-12-15 ENCOUNTER — Other Ambulatory Visit: Payer: Self-pay

## 2021-12-15 DIAGNOSIS — M25551 Pain in right hip: Secondary | ICD-10-CM | POA: Diagnosis not present

## 2021-12-15 MED ORDER — KETOROLAC TROMETHAMINE 30 MG/ML IJ SOLN
30.0000 mg | Freq: Once | INTRAMUSCULAR | Status: AC
  Administered 2021-12-15: 30 mg via INTRAMUSCULAR
  Filled 2021-12-15: qty 1

## 2021-12-15 MED ORDER — MELOXICAM 15 MG PO TABS: 15.0000 mg | ORAL_TABLET | Freq: Every day | ORAL | 0 refills | Status: DC

## 2021-12-15 NOTE — ED Provider Notes (Signed)
[facility] Provider Note    Event Date/Time   First MD Initiated Contact with Patient 12/15/21 2028     (approximate)   History   Chief Complaint Hip Pain   HPI Morgan Dixon is a 71 y.o. female, history of chronic pain, obesity, prior CVA, presents emergency department for evaluation of right-sided hip pain.  She states that it started this morning and states it is associated with shooting sharp sensation going down into her thigh and foot.  Endorses difficulty walking because of it.  Denies any recent injuries or illnesses.  Denies fever/chills, chest pain, shortness of breath, abdominal pain, bowel/bladder dysfunction, rash/lesions, dizziness/lightheadedness, diarrhea, or cold sensation in the affected extremity.  History Limitations: No limitations.        Physical Exam  Triage Vital Signs: ED Triage Vitals  Enc Vitals Group     BP 12/15/21 2000 (!) 127/58     Pulse Rate 12/15/21 2000 62     Resp 12/15/21 2000 18     Temp 12/15/21 2000 98.9 F (37.2 C)     Temp Source 12/15/21 2000 Oral     SpO2 12/15/21 2000 95 %     Weight 12/15/21 2000 240 lb (108.9 kg)     Height 12/15/21 2000 '5\' 6"'$  (1.676 m)     Head Circumference --      Peak Flow --      Pain Score 12/15/21 1959 10     Pain Loc --      Pain Edu? --      Excl. in [place]? --     Most recent vital signs: Vitals:   12/15/21 2000  BP: (!) 127/58  Pulse: 62  Resp: 18  Temp: 98.9 F (37.2 C)  SpO2: 95%    General: Awake, NAD.  Skin: Warm, dry. No rashes or lesions.  Eyes: PERRL. Conjunctivae normal.  CV: Good peripheral perfusion.  Resp: Normal effort.  Abd: Soft, non-tender. No distention.  Neuro: At baseline. No gross neurological deficits.  Musculoskeletal: Normal ROM of all extremities.   Focused Exam: No gross deformities to the right lower extremity.  She does have some lymphedema present bilaterally in both lower extremities, which she says is normal for her.   Positive straight leg test.  PMS intact distally.  No bony tenderness to the hip or thigh.  Physical Exam    ED Results / Procedures / Treatments  Labs (all labs ordered are listed, but only abnormal results are displayed) Labs Reviewed - No data to display   EKG N/A.   RADIOLOGY  ED Provider Interpretation: I personally reviewed and interpreted this x-ray, mild bilateral degenerative hip arthritis present.  DG Hip Unilat  With Pelvis 2-3 Views Right  Result Date: 12/15/2021 CLINICAL DATA:  Right hip pain EXAM: DG HIP (WITH OR WITHOUT PELVIS) 2-3V RIGHT COMPARISON:  None Available. FINDINGS: Normal alignment. No acute fracture or dislocation. Mild bilateral degenerative hip arthritis. Changes of degenerative disc disease are noted at the lumbosacral junction, not well profiled on this examination. Soft tissues are unremarkable. IMPRESSION: Mild bilateral degenerative hip arthritis. Electronically Signed   By: Fidela Salisbury M.D.   On: 12/15/2021 20:42    PROCEDURES:  Critical Care performed: N/A.  Procedures    MEDICATIONS ORDERED IN ED: Medications  ketorolac (TORADOL) 30 MG/ML injection 30 mg (has no administration in time range)     IMPRESSION / MDM / ASSESSMENT AND PLAN / ED COURSE  I reviewed the triage  vital signs and the nursing notes.                              Differential diagnosis includes, but is not limited to, sciatica, piriformis syndrome, osteoarthritis, pelvic strain, lumbar radiculopathy, herniated disc  Assessment/Plan Presentation consistent with sciatica, likely exacerbated by underlying osteoarthritis.  No signs of any infectious pathology.  X-ray does not show any acute fractures.  Low suspicion for any serious or life-threatening pathology.  She is still able to ambulate on her own.  Treated here with IM ketorolac.  She is currently already on tizanidine and gabapentin.  We will add meloxicam to her current regimen and have her follow-up with  orthopedics.  Advised her to take the meloxicam with food to avoid upset stomach.  Recommend taking omeprazole as well to prevent ulcers.  Provided the patient with anticipatory guidance, return precautions, and educational material. Encouraged the patient to return to the emergency department at any time if they begin to experience any new or worsening symptoms. Patient expressed understanding and agreed with the plan.   Patient's presentation is most consistent with acute complicated illness / injury requiring diagnostic workup.       FINAL CLINICAL IMPRESSION(S) / ED DIAGNOSES   Final diagnoses:  Right hip pain     Rx / DC Orders   ED Discharge Orders          Ordered    meloxicam (MOBIC) 15 MG tablet  Daily        12/15/21 2123             Note:  This document was prepared using Dragon voice recognition software and may include unintentional dictation errors.   Teodoro Spray, [place] 12/15/21 2135    Nena Polio, MD 12/15/21 2253

## 2021-12-15 NOTE — Discharge Instructions (Addendum)
-  You may continue taking your home medications for pain.  You may additionally take meloxicam as needed, though be sure to take it with food to avoid upset stomach.  Recommend taking with omeprazole (Prilosec) to prevent ulcers.  -Reviewed the educational material provided.  Recommend the sciatica rehab exercises.  -Follow-up with the orthopedist listed in these instructions, or your own orthopedist.  -Return to the emergency department anytime if you begin to experience any new or worsening symptoms.

## 2021-12-15 NOTE — ED Triage Notes (Signed)
Pt states right hip pain since this am. Pt denies fever. Pt denies history of known OA.

## 2021-12-28 ENCOUNTER — Inpatient Hospital Stay
Admission: EM | Admit: 2021-12-28 | Discharge: 2022-01-06 | DRG: 336 | Disposition: A | Discharge status: Home / Self Care | Payer: Medicare HMO | Attending: Internal Medicine | Admitting: Internal Medicine

## 2021-12-28 ENCOUNTER — Other Ambulatory Visit: Payer: Self-pay

## 2021-12-28 ENCOUNTER — Encounter: Payer: Self-pay | Admitting: *Deleted

## 2021-12-28 DIAGNOSIS — Z880 Allergy status to penicillin: Secondary | ICD-10-CM

## 2021-12-28 DIAGNOSIS — G894 Chronic pain syndrome: Secondary | ICD-10-CM | POA: Diagnosis present

## 2021-12-28 DIAGNOSIS — Z803 Family history of malignant neoplasm of breast: Secondary | ICD-10-CM

## 2021-12-28 DIAGNOSIS — Z823 Family history of stroke: Secondary | ICD-10-CM

## 2021-12-28 DIAGNOSIS — Z6838 Body mass index (BMI) 38.0-38.9, adult: Secondary | ICD-10-CM

## 2021-12-28 DIAGNOSIS — K430 Incisional hernia with obstruction, without gangrene: Secondary | ICD-10-CM | POA: Diagnosis not present

## 2021-12-28 DIAGNOSIS — I10 Essential (primary) hypertension: Secondary | ICD-10-CM | POA: Diagnosis present

## 2021-12-28 DIAGNOSIS — F1190 Opioid use, unspecified, uncomplicated: Secondary | ICD-10-CM | POA: Diagnosis present

## 2021-12-28 DIAGNOSIS — R1033 Periumbilical pain: Principal | ICD-10-CM

## 2021-12-28 DIAGNOSIS — Z79891 Long term (current) use of opiate analgesic: Secondary | ICD-10-CM

## 2021-12-28 DIAGNOSIS — Z888 Allergy status to other drugs, medicaments and biological substances status: Secondary | ICD-10-CM

## 2021-12-28 DIAGNOSIS — Z9049 Acquired absence of other specified parts of digestive tract: Secondary | ICD-10-CM

## 2021-12-28 DIAGNOSIS — K5651 Intestinal adhesions [bands], with partial obstruction: Secondary | ICD-10-CM | POA: Diagnosis present

## 2021-12-28 DIAGNOSIS — Z79899 Other long term (current) drug therapy: Secondary | ICD-10-CM

## 2021-12-28 DIAGNOSIS — E876 Hypokalemia: Secondary | ICD-10-CM | POA: Diagnosis present

## 2021-12-28 DIAGNOSIS — Z91040 Latex allergy status: Secondary | ICD-10-CM

## 2021-12-28 DIAGNOSIS — I639 Cerebral infarction, unspecified: Secondary | ICD-10-CM | POA: Diagnosis present

## 2021-12-28 DIAGNOSIS — Z8673 Personal history of transient ischemic attack (TIA), and cerebral infarction without residual deficits: Secondary | ICD-10-CM

## 2021-12-28 DIAGNOSIS — K56600 Partial intestinal obstruction, unspecified as to cause: Secondary | ICD-10-CM | POA: Diagnosis present

## 2021-12-28 DIAGNOSIS — K436 Other and unspecified ventral hernia with obstruction, without gangrene: Secondary | ICD-10-CM

## 2021-12-28 DIAGNOSIS — K439 Ventral hernia without obstruction or gangrene: Secondary | ICD-10-CM

## 2021-12-28 DIAGNOSIS — Z90710 Acquired absence of both cervix and uterus: Secondary | ICD-10-CM

## 2021-12-28 DIAGNOSIS — Z9884 Bariatric surgery status: Secondary | ICD-10-CM

## 2021-12-28 DIAGNOSIS — Z833 Family history of diabetes mellitus: Secondary | ICD-10-CM

## 2021-12-28 DIAGNOSIS — E669 Obesity, unspecified: Secondary | ICD-10-CM | POA: Diagnosis present

## 2021-12-28 DIAGNOSIS — Z23 Encounter for immunization: Secondary | ICD-10-CM

## 2021-12-28 LAB — CBC
HCT: 35.5 % — ABNORMAL LOW (ref 36.0–46.0)
Hemoglobin: 11.1 g/dL — ABNORMAL LOW (ref 12.0–15.0)
MCH: 26.6 pg (ref 26.0–34.0)
MCHC: 31.3 g/dL (ref 30.0–36.0)
MCV: 84.9 fL (ref 80.0–100.0)
Platelets: 175 10*3/uL (ref 150–400)
RBC: 4.18 MIL/uL (ref 3.87–5.11)
RDW: 15.4 % (ref 11.5–15.5)
WBC: 4.8 10*3/uL (ref 4.0–10.5)
nRBC: 0 % (ref 0.0–0.2)

## 2021-12-28 LAB — COMPREHENSIVE METABOLIC PANEL
ALT: 13 U/L (ref 0–44)
AST: 31 U/L (ref 15–41)
Albumin: 3.7 g/dL (ref 3.5–5.0)
Alkaline Phosphatase: 61 U/L (ref 38–126)
Anion gap: 12 (ref 5–15)
BUN: 13 mg/dL (ref 8–23)
CO2: 28 mmol/L (ref 22–32)
Calcium: 9.4 mg/dL (ref 8.9–10.3)
Chloride: 103 mmol/L (ref 98–111)
Creatinine, Ser: 0.79 mg/dL (ref 0.44–1.00)
GFR, Estimated: >60 mL/min (ref >60)
Glucose, Bld: 104 mg/dL — ABNORMAL HIGH (ref 70–99)
Potassium: 4.3 mmol/L (ref 3.5–5.1)
Sodium: 143 mmol/L (ref 135–145)
Total Bilirubin: 2.6 mg/dL — ABNORMAL HIGH (ref 0.3–1.2)
Total Protein: 7.5 g/dL (ref 6.5–8.1)

## 2021-12-28 LAB — LIPASE, BLOOD: Lipase: 31 U/L (ref 11–51)

## 2021-12-28 NOTE — ED Triage Notes (Signed)
Pt brought in via ems from [facility].  Pt has abd pain for  2 days.  Pt has nausea.  No v/d  pt alert  speech clear.

## 2021-12-29 ENCOUNTER — Emergency Department: Payer: Medicare HMO

## 2021-12-29 DIAGNOSIS — Z79899 Other long term (current) drug therapy: Secondary | ICD-10-CM | POA: Diagnosis not present

## 2021-12-29 DIAGNOSIS — G894 Chronic pain syndrome: Secondary | ICD-10-CM | POA: Diagnosis present

## 2021-12-29 DIAGNOSIS — Z6838 Body mass index (BMI) 38.0-38.9, adult: Secondary | ICD-10-CM | POA: Diagnosis not present

## 2021-12-29 DIAGNOSIS — Z23 Encounter for immunization: Secondary | ICD-10-CM | POA: Diagnosis present

## 2021-12-29 DIAGNOSIS — Z833 Family history of diabetes mellitus: Secondary | ICD-10-CM | POA: Diagnosis not present

## 2021-12-29 DIAGNOSIS — Z9049 Acquired absence of other specified parts of digestive tract: Secondary | ICD-10-CM | POA: Diagnosis not present

## 2021-12-29 DIAGNOSIS — Z90710 Acquired absence of both cervix and uterus: Secondary | ICD-10-CM | POA: Diagnosis not present

## 2021-12-29 DIAGNOSIS — K5651 Intestinal adhesions [bands], with partial obstruction: Secondary | ICD-10-CM | POA: Diagnosis present

## 2021-12-29 DIAGNOSIS — Z8673 Personal history of transient ischemic attack (TIA), and cerebral infarction without residual deficits: Secondary | ICD-10-CM | POA: Diagnosis not present

## 2021-12-29 DIAGNOSIS — E669 Obesity, unspecified: Secondary | ICD-10-CM | POA: Diagnosis present

## 2021-12-29 DIAGNOSIS — K439 Ventral hernia without obstruction or gangrene: Secondary | ICD-10-CM

## 2021-12-29 DIAGNOSIS — K56600 Partial intestinal obstruction, unspecified as to cause: Secondary | ICD-10-CM | POA: Diagnosis not present

## 2021-12-29 DIAGNOSIS — Z880 Allergy status to penicillin: Secondary | ICD-10-CM | POA: Diagnosis not present

## 2021-12-29 DIAGNOSIS — Z91040 Latex allergy status: Secondary | ICD-10-CM | POA: Diagnosis not present

## 2021-12-29 DIAGNOSIS — I10 Essential (primary) hypertension: Secondary | ICD-10-CM | POA: Diagnosis present

## 2021-12-29 DIAGNOSIS — Z803 Family history of malignant neoplasm of breast: Secondary | ICD-10-CM | POA: Diagnosis not present

## 2021-12-29 DIAGNOSIS — E876 Hypokalemia: Secondary | ICD-10-CM | POA: Diagnosis present

## 2021-12-29 DIAGNOSIS — R1033 Periumbilical pain: Secondary | ICD-10-CM | POA: Diagnosis not present

## 2021-12-29 DIAGNOSIS — Z79891 Long term (current) use of opiate analgesic: Secondary | ICD-10-CM | POA: Diagnosis not present

## 2021-12-29 DIAGNOSIS — K436 Other and unspecified ventral hernia with obstruction, without gangrene: Secondary | ICD-10-CM

## 2021-12-29 DIAGNOSIS — K430 Incisional hernia with obstruction, without gangrene: Secondary | ICD-10-CM | POA: Diagnosis present

## 2021-12-29 DIAGNOSIS — Z888 Allergy status to other drugs, medicaments and biological substances status: Secondary | ICD-10-CM | POA: Diagnosis not present

## 2021-12-29 DIAGNOSIS — Z823 Family history of stroke: Secondary | ICD-10-CM | POA: Diagnosis not present

## 2021-12-29 DIAGNOSIS — Z9884 Bariatric surgery status: Secondary | ICD-10-CM | POA: Diagnosis not present

## 2021-12-29 LAB — URINALYSIS, ROUTINE W REFLEX MICROSCOPIC
Bacteria, UA: NONE SEEN
Bilirubin Urine: NEGATIVE
Glucose, UA: NEGATIVE mg/dL
Ketones, ur: 5 mg/dL — AB
Nitrite: NEGATIVE
Protein, ur: NEGATIVE mg/dL
Specific Gravity, Urine: >1.046 — ABNORMAL HIGH (ref 1.005–1.030)
pH: 6 (ref 5.0–8.0)

## 2021-12-29 LAB — CBC
HCT: 31.7 % — ABNORMAL LOW (ref 36.0–46.0)
Hemoglobin: 9.8 g/dL — ABNORMAL LOW (ref 12.0–15.0)
MCH: 26.7 pg (ref 26.0–34.0)
MCHC: 30.9 g/dL (ref 30.0–36.0)
MCV: 86.4 fL (ref 80.0–100.0)
Platelets: 148 10*3/uL — ABNORMAL LOW (ref 150–400)
RBC: 3.67 MIL/uL — ABNORMAL LOW (ref 3.87–5.11)
RDW: 15.6 % — ABNORMAL HIGH (ref 11.5–15.5)
WBC: 5 10*3/uL (ref 4.0–10.5)
nRBC: 0 % (ref 0.0–0.2)

## 2021-12-29 LAB — CREATININE, SERUM
Creatinine, Ser: 0.82 mg/dL (ref 0.44–1.00)
GFR, Estimated: >60 mL/min (ref >60)

## 2021-12-29 MED ORDER — LORAZEPAM 2 MG/ML IJ SOLN
2.0000 mg | Freq: Once | INTRAMUSCULAR | Status: DC
  Filled 2021-12-29: qty 1

## 2021-12-29 MED ORDER — ENOXAPARIN SODIUM 60 MG/0.6ML IJ SOSY
0.5000 mg/kg | PREFILLED_SYRINGE | INTRAMUSCULAR | Status: DC
  Administered 2021-12-29 – 2021-12-30 (×2): 55 mg via SUBCUTANEOUS
  Filled 2021-12-29 (×2): qty 0.6

## 2021-12-29 MED ORDER — LACTATED RINGERS IV SOLN: INTRAVENOUS | Status: DC

## 2021-12-29 MED ORDER — ACETAMINOPHEN 650 MG RE SUPP: 650.0000 mg | Freq: Four times a day (QID) | RECTAL | Status: DC | PRN

## 2021-12-29 MED ORDER — LACTATED RINGERS IV BOLUS
1000.0000 mL | Freq: Once | INTRAVENOUS | Status: AC
  Administered 2021-12-29: 1000 mL via INTRAVENOUS

## 2021-12-29 MED ORDER — BUPRENORPHINE HCL 8 MG SL SUBL
8.0000 mg | SUBLINGUAL_TABLET | Freq: Two times a day (BID) | SUBLINGUAL | Status: DC
  Administered 2021-12-29 – 2021-12-30 (×3): 8 mg via SUBLINGUAL
  Filled 2021-12-29 (×4): qty 1

## 2021-12-29 MED ORDER — TOPIRAMATE 25 MG PO TABS
25.0000 mg | ORAL_TABLET | Freq: Once | ORAL | Status: AC
  Administered 2021-12-29: 25 mg via ORAL
  Filled 2021-12-29: qty 1

## 2021-12-29 MED ORDER — TIZANIDINE HCL 2 MG PO TABS
4.0000 mg | ORAL_TABLET | Freq: Once | ORAL | Status: AC
  Administered 2021-12-29: 4 mg via ORAL
  Filled 2021-12-29: qty 2

## 2021-12-29 MED ORDER — IOHEXOL 300 MG/ML  SOLN
100.0000 mL | Freq: Once | INTRAMUSCULAR | Status: AC | PRN
  Administered 2021-12-29: 100 mL via INTRAVENOUS

## 2021-12-29 MED ORDER — MORPHINE SULFATE (PF) 2 MG/ML IV SOLN
2.0000 mg | INTRAVENOUS | Status: DC | PRN
  Administered 2021-12-29 – 2021-12-30 (×7): 2 mg via INTRAVENOUS
  Filled 2021-12-29 (×7): qty 1

## 2021-12-29 MED ORDER — BUPRENORPHINE HCL 8 MG SL SUBL
8.0000 mg | SUBLINGUAL_TABLET | Freq: Once | SUBLINGUAL | Status: AC
  Administered 2021-12-29: 8 mg via SUBLINGUAL
  Filled 2021-12-29: qty 1

## 2021-12-29 MED ORDER — PREGABALIN 50 MG PO CAPS
100.0000 mg | ORAL_CAPSULE | Freq: Once | ORAL | Status: AC
  Administered 2021-12-29: 100 mg via ORAL
  Filled 2021-12-29: qty 2

## 2021-12-29 MED ORDER — ACETAMINOPHEN 325 MG PO TABS
650.0000 mg | ORAL_TABLET | Freq: Four times a day (QID) | ORAL | Status: DC | PRN
  Administered 2022-01-02 – 2022-01-04 (×2): 650 mg via ORAL
  Filled 2021-12-29 (×2): qty 2

## 2021-12-29 MED ORDER — FOSFOMYCIN TROMETHAMINE 3 G PO PACK
3.0000 g | PACK | Freq: Once | ORAL | Status: AC
  Administered 2021-12-29: 3 g via ORAL
  Filled 2021-12-29: qty 3

## 2021-12-29 MED ORDER — ONDANSETRON HCL 4 MG/2ML IJ SOLN
4.0000 mg | Freq: Four times a day (QID) | INTRAMUSCULAR | Status: DC | PRN
  Administered 2021-12-30 – 2022-01-05 (×5): 4 mg via INTRAVENOUS
  Filled 2021-12-29 (×4): qty 2

## 2021-12-29 MED ORDER — HYDRALAZINE HCL 20 MG/ML IJ SOLN
10.0000 mg | Freq: Four times a day (QID) | INTRAMUSCULAR | Status: DC | PRN
  Administered 2021-12-30 – 2022-01-05 (×9): 10 mg via INTRAVENOUS
  Filled 2021-12-29 (×9): qty 1

## 2021-12-29 MED ORDER — ONDANSETRON HCL 4 MG PO TABS: 4.0000 mg | ORAL_TABLET | Freq: Four times a day (QID) | ORAL | Status: DC | PRN

## 2021-12-29 MED ORDER — INFLUENZA VAC A&B SA ADJ QUAD 0.5 ML IM PRSY
0.5000 mL | PREFILLED_SYRINGE | INTRAMUSCULAR | Status: AC
  Administered 2021-12-30: 0.5 mL via INTRAMUSCULAR
  Filled 2021-12-29 (×2): qty 0.5

## 2021-12-29 NOTE — Progress Notes (Signed)
Anticoagulation monitoring(Lovenox):  71 yo female ordered Lovenox 40 mg Q24h    Filed Weights   12/28/21 2135  Weight: 108.9 kg (240 lb)   BMI 38.7   Lab Results  Component Value Date   CREATININE 0.79 12/28/2021   CREATININE 0.99 01/12/2021   CREATININE 0.94 09/05/2020   Estimated Creatinine Clearance: 80.5 mL/min (by C-G formula based on SCr of 0.79 mg/dL). Hemoglobin & Hematocrit     Component Value Date/Time   HGB 11.1 (L) 12/28/2021 2137   HGB 12.9 12/11/2012 1744   HCT 35.5 (L) 12/28/2021 2137   HCT 36.8 12/11/2012 1744     Per Protocol for Patient with estCrcl > 30 ml/min and BMI > 30, will transition to Lovenox 55 mg Q24H.

## 2021-12-29 NOTE — ED Notes (Signed)
Lunch box offered... pt refused.

## 2021-12-29 NOTE — Assessment & Plan Note (Signed)
See above

## 2021-12-29 NOTE — Consult Note (Signed)
Subjective:   CC: incisional hernia  HPI:  Morgan Dixon is a 71 y.o. female who was consulted by [name] for issue above.  Symptoms were first noted 2 days ago. Pain is sharp, diffuse. Worse on right periumbilical area. Associated with nausea, exacerbated by nothing specific  Unaware of having a hernia before.  Last BM two days ago, which is typical for her.  No recent sick contacts   Past Medical History:  has a past medical history of Chronic pain and Edema extremities.  Past Surgical History:  Past Surgical History:  Procedure Laterality Date   ABDOMINAL HYSTERECTOMY  1989   BACK SURGERY     GASTRIC BYPASS      Family History: family history includes Breast cancer (age of onset: 63) in her maternal aunt; Diabetes in her father; Stroke in her father.  Social History:  reports that she has never smoked. She has never used smokeless tobacco. She reports that she does not drink alcohol and does not use drugs.  Current Medications:  Prior to Admission medications   Medication Sig Start Date End Date Taking? Authorizing Provider  acetaminophen (TYLENOL) 500 MG tablet Take 2 tablets (1,000 mg total) by mouth every 6 (six) hours as needed for moderate pain. 08/27/20   Debbe Odea, MD  atorvastatin (LIPITOR) 40 MG tablet Take 1 tablet (40 mg total) by mouth daily. 09/05/20 11/15/20  Pokhrel, Corrie Mckusick, MD  buPROPion (WELLBUTRIN SR) 150 MG 12 hr tablet Take 1 tablet (150 mg total) by mouth daily. Patient not taking: Reported on 12/29/2021 09/05/20   Flora Lipps, MD  butalbital-acetaminophen-caffeine (FIORICET) 50-325-40 MG tablet Take 1 tablet by mouth every 6 (six) hours as needed for headache. Patient not taking: Reported on 12/29/2021 09/05/20   Flora Lipps, MD  hydrochlorothiazide (HYDRODIURIL) 25 MG tablet Take 25 mg by mouth daily.    [provider]  losartan (COZAAR) 25 MG tablet Take 1 tablet (25 mg total) by mouth daily. 09/06/20   Pokhrel, Corrie Mckusick, MD  melatonin 3 MG  TABS tablet Take 1 tablet (3 mg total) by mouth at bedtime. 09/05/20   Pokhrel, Corrie Mckusick, MD  meloxicam (MOBIC) 15 MG tablet Take 1 tablet (15 mg total) by mouth daily. 12/15/21 01/14/22  Teodoro Spray, PA  Multiple Vitamin (MULTIVITAMIN) tablet Take 1 tablet by mouth daily. Bariatric vitamin    [provider]  ondansetron (ZOFRAN-ODT) 4 MG disintegrating tablet Take 4 mg by mouth every 8 (eight) hours as needed for nausea or vomiting. Patient not taking: Reported on 12/29/2021 08/12/20   [provider]  pregabalin (LYRICA) 100 MG capsule Take 200 mg by mouth 2 (two) times daily. 08/12/20   [provider]  sertraline (ZOLOFT) 25 MG tablet Take 25 mg by mouth at bedtime. 05/18/20   [provider]  tiZANidine (ZANAFLEX) 4 MG tablet Take 4 mg by mouth 3 (three) times daily as needed for muscle spasms.    [provider]  topiramate (TOPAMAX) 25 MG tablet Take 1 tablet (25 mg total) by mouth 2 (two) times daily. 09/05/20   Flora Lipps, MD    Allergies:  Allergies as of 12/28/2021 - Review Complete 12/28/2021  Allergen Reaction Noted   Bupropion Nausea And Vomiting 03/25/2020   Diazepam Rash 12/12/2017   Penicillins Itching 12/12/2017   Rofecoxib Swelling 12/18/2017   Latex Itching 02/12/2017   Prozac [fluoxetine hcl] Itching and Rash 02/06/2015   Zinc oxide [mexsana] Rash 12/18/2017    ROS:  General: Denies weight loss,  weight gain, fatigue, fevers, chills, and night sweats. Eyes: Denies blurry vision, double vision, eye pain, itchy eyes, and tearing. Ears: Denies hearing loss, earache, and ringing in ears. Nose: Denies sinus pain, congestion, infections, runny nose, and nosebleeds. Mouth/throat: Denies hoarseness, sore throat, bleeding gums, and difficulty swallowing. Heart: Denies chest pain, palpitations, racing heart, irregular heartbeat, leg pain or swelling, and decreased activity tolerance. Respiratory: Denies breathing difficulty,  shortness of breath, wheezing, cough, and sputum. GI: Denies change in appetite, heartburn, nausea, constipation, diarrhea, and blood in stool. GU: Denies difficulty urinating, pain with urinating, urgency, frequency, blood in urine. Musculoskeletal: Denies joint stiffness, pain, swelling, muscle weakness. Skin: Denies rash, itching, mass, tumors, sores, and boils Neurologic: Denies headache, fainting, dizziness, seizures, numbness, and tingling. Psychiatric: Denies depression, anxiety, difficulty sleeping, and memory loss. Endocrine: Denies heat or cold intolerance, and increased thirst or urination. Blood/lymph: Denies easy bruising, easy bruising, and swollen glands     Objective:     BP (!) 127/54   Pulse (!) 44   Temp 98.1 F (36.7 C)   Resp 14   Ht '5\' 6"'$  (1.676 m)   Wt 108.9 kg   SpO2 97%   BMI 38.74 kg/m   Constitutional :  alert, cooperative, appears stated age, and no distress  Lymphatics/Throat:  no asymmetry, masses, or scars  Respiratory:  clear to auscultation bilaterally  Cardiovascular:  regular rate and rhythm  Gastrointestinal: Soft, no guarding, TTP all four quadrants, reducible incisional hernia right periumbilical with some effort.  Hernia does return with straining but was more easily reducible after initial reduction .   Musculoskeletal: Steady movement  Skin: Cool and moist, multiple abdominal surgical scars   Psychiatric: Normal affect, non-agitated, not confused       LABS:     Latest Ref Rng & Units 12/29/2021    5:16 AM 12/28/2021    9:37 PM 01/12/2021   12:58 PM  CMP  Glucose 70 - 99 mg/dL  104  107   BUN 8 - 23 mg/dL  13  25   Creatinine 0.44 - 1.00 mg/dL 0.82  0.79  0.99   Sodium 135 - 145 mmol/L  143  137   Potassium 3.5 - 5.1 mmol/L  4.3  3.7   Chloride 98 - 111 mmol/L  103  97   CO2 22 - 32 mmol/L  28  28   Calcium 8.9 - 10.3 mg/dL  9.4  8.9   Total Protein 6.5 - 8.1 g/dL  7.5  7.3   Total Bilirubin 0.3 - 1.2 mg/dL  2.6  1.5    Alkaline Phos 38 - 126 U/L  61  77   AST 15 - 41 U/L  31  21   ALT 0 - 44 U/L  13  14       Latest Ref Rng & Units 12/29/2021    5:16 AM 12/28/2021    9:37 PM 01/12/2021   12:58 PM  CBC  WBC 4.0 - 10.5 K/uL 5.0  4.8  4.6   Hemoglobin 12.0 - 15.0 g/dL 9.8  11.1  8.5   Hematocrit 36.0 - 46.0 % 31.7  35.5  26.9   Platelets 150 - 400 K/uL 148  175  147     RADS: CLINICAL DATA:  Abdominal pain for 2 days.   EXAM: CT ABDOMEN AND PELVIS WITH CONTRAST   TECHNIQUE: Multidetector CT imaging of the abdomen and pelvis was performed using the standard protocol following bolus administration of intravenous contrast.  RADIATION DOSE REDUCTION: This exam was performed according to the departmental dose-optimization program which includes automated exposure control, adjustment of the mA and/or kV according to patient size and/or use of iterative reconstruction technique.   CONTRAST:  156m OMNIPAQUE IOHEXOL 300 MG/ML  SOLN   COMPARISON:  Aug 17, 2020   FINDINGS: Lower chest: No acute abnormality.   Hepatobiliary: No focal liver abnormality is seen. Status post cholecystectomy. No biliary dilatation.   Pancreas: Unremarkable. No pancreatic ductal dilatation or surrounding inflammatory changes.   Spleen: Punctate calcified granulomas are seen within the mildly enlarged spleen.   Adrenals/Urinary Tract: Adrenal glands are unremarkable. Kidneys are normal in size, without renal calculi or hydronephrosis. Small bilateral simple renal cysts are noted. Bladder is unremarkable.   Stomach/Bowel: There is a small hiatal hernia. Multiple adjacent surgical clips and surgical sutures are seen. Surgical clips are also seen along the splenic hilum. The appendix is not clearly identified. Small bowel loops within the mid and lower abdomen approach the upper limit of normal caliber. A dilated small bowel loop (3.5 cm) is seen within the mid left abdomen (axial CT image 54, CT series 2). A  transition zone is seen within the mid left abdomen (axial CT images 54 through 57, CT series 2). Mildly thickened small bowel loops are also seen within the mid and lower abdomen, at and distal to the transition zone. Noninflamed diverticula are seen within the sigmoid colon.   Vascular/Lymphatic: No significant vascular findings are present. No enlarged abdominal or pelvic lymph nodes.   Reproductive: Status post hysterectomy. No adnexal masses.   Other: A 6.1 cm x 2.8 cm ventral hernia is seen along the anterior aspect of the mid abdominal wall, to the right of midline. This contains fluid and a short segment of decompressed small bowel, and is increased in size when compared to the prior study.   Musculoskeletal: Multilevel degenerative changes seen throughout the lumbar spine.   IMPRESSION: 1. Moderate severity enteritis involving multiple loops of mid and distal small bowel, with additional findings consistent with a subsequent early partial small bowel obstruction. 2. Evidence of prior gastric bypass surgery, prior cholecystectomy and prior hysterectomy. 3. Ventral hernia, as described above. 4. Sigmoid diverticulosis. 5. Small bilateral simple renal cysts. No additional follow-up or imaging is recommended. This recommendation follows ACR consensus guidelines: Management of the Incidental Renal Mass on CT: A White Paper of the ACR Incidental Findings Committee. J Am Coll Radiol [phone]     Electronically Signed   By: TVirgina NorfolkM.D.   On: 12/29/2021 01:00   Assessment:   Incisional hernia and CT findings as noted above.  Hernia again completely reduced at bedside by myself.  Plan:   CT changes hopefully secondary to incarcerated hernia.  Pt looked more comfortable post reduction.  Continue NPO, just in case until return of flatus or BM.  Also monitor pain as well.  No need to proceed with immediate repair at this time unless she has persistent  issues.     The patient verbalized understanding and all questions were answered to the patient's satisfaction.  labs/images/medications/previous chart entries reviewed personally and relevant changes/updates noted above.

## 2021-12-29 NOTE — Discharge Instructions (Signed)
Please take your pain medications as prescribed and follow-up with Dr. Lysle Pearl in the clinic.  He is a Garment/textile technologist who can talk with you about electively fixing the hernia from today.

## 2021-12-29 NOTE — H&P (Signed)
History and Physical    Patient: Morgan Dixon:[id number] DOB: May 20, 1950 DOA: 12/28/2021 DOS: the patient was seen and examined on 12/29/2021 PCP: Alfonse Flavors, MD  Patient coming from: Home  Chief Complaint:  Chief Complaint  Patient presents with   Abdominal Pain    HPI: Morgan Dixon is a 71 y.o. female with medical history significant for Chronic pain on buprenorphine presents to the ED with a 2-day history of generalized abdominal pain with nausea.  Denies diarrhea or dysuria, fever or chills. ED course and data review: BP 162/67 with borderline low pulse 44-62 and otherwise normal vitals.  Labs significant for hemoglobin 11.1 which is up from her baseline, bilirubin 2.6 up from 1.5 about a year ago.  Urinalysis with moderate leukocyte esterase.  CT abdomen and pelvis showed the following: IMPRESSION: 1. Moderate severity enteritis involving multiple loops of mid and distal small bowel, with additional findings consistent with a subsequent early partial small bowel obstruction. 2. Evidence of prior gastric bypass surgery, prior cholecystectomy and prior hysterectomy. 3. Ventral hernia, as described above. 4. Sigmoid diverticulosis. 5. Small bilateral simple renal cysts. No additional follow-up or imaging is recommended  Patient had reduction in her ventral hernia in the ED initially with improved pain but due to CT findings of partial small bowel obstruction, hospitalization requested.   Review of Systems: As mentioned in the history of present illness. All other systems reviewed and are negative.  Past Medical History:  Diagnosis Date   Chronic pain    Edema extremities    Past Surgical History:  Procedure Laterality Date   ABDOMINAL HYSTERECTOMY  1989   BACK SURGERY     GASTRIC BYPASS     Social History:  reports that she has never smoked. She has never used smokeless tobacco. She reports that she does not drink alcohol and does not use  drugs.  Allergies  Allergen Reactions   Bupropion Nausea And Vomiting   Diazepam Rash   Penicillins Itching   Rofecoxib Swelling   Latex Itching    When used gloves in the past   Prozac [Fluoxetine Hcl] Itching and Rash   Zinc Oxide [Mexsana] Rash    Family History  Problem Relation Age of Onset   Breast cancer Maternal Aunt 60   Diabetes Father    Stroke Father     Prior to Admission medications   Medication Sig Start Date End Date Taking? Authorizing Provider  acetaminophen (TYLENOL) 500 MG tablet Take 2 tablets (1,000 mg total) by mouth every 6 (six) hours as needed for moderate pain. 08/27/20   Debbe Odea, MD  atorvastatin (LIPITOR) 40 MG tablet Take 1 tablet (40 mg total) by mouth daily. 09/05/20 11/15/20  Pokhrel, Corrie Mckusick, MD  buPROPion (WELLBUTRIN SR) 150 MG 12 hr tablet Take 1 tablet (150 mg total) by mouth daily. Patient not taking: Reported on 12/29/2021 09/05/20   Flora Lipps, MD  butalbital-acetaminophen-caffeine (FIORICET) 50-325-40 MG tablet Take 1 tablet by mouth every 6 (six) hours as needed for headache. Patient not taking: Reported on 12/29/2021 09/05/20   Flora Lipps, MD  hydrochlorothiazide (HYDRODIURIL) 25 MG tablet Take 25 mg by mouth daily.    [provider]  losartan (COZAAR) 25 MG tablet Take 1 tablet (25 mg total) by mouth daily. 09/06/20   Pokhrel, Corrie Mckusick, MD  melatonin 3 MG TABS tablet Take 1 tablet (3 mg total) by mouth at bedtime. 09/05/20   Pokhrel, Corrie Mckusick, MD  meloxicam (MOBIC) 15 MG tablet Take 1  tablet (15 mg total) by mouth daily. 12/15/21 01/14/22  Teodoro Spray, PA  Multiple Vitamin (MULTIVITAMIN) tablet Take 1 tablet by mouth daily. Bariatric vitamin    [provider]  ondansetron (ZOFRAN-ODT) 4 MG disintegrating tablet Take 4 mg by mouth every 8 (eight) hours as needed for nausea or vomiting. Patient not taking: Reported on 12/29/2021 08/12/20   [provider]  pregabalin (LYRICA) 100 MG capsule Take 200 mg by  mouth 2 (two) times daily. 08/12/20   [provider]  sertraline (ZOLOFT) 25 MG tablet Take 25 mg by mouth at bedtime. 05/18/20   [provider]  tiZANidine (ZANAFLEX) 4 MG tablet Take 4 mg by mouth 3 (three) times daily as needed for muscle spasms.    [provider]  topiramate (TOPAMAX) 25 MG tablet Take 1 tablet (25 mg total) by mouth 2 (two) times daily. 09/05/20   Flora Lipps, MD    Physical Exam: Vitals:   12/29/21 0200 12/29/21 0230 12/29/21 0300 12/29/21 0330  BP: (!) 135/56 (!) 132/58 (!) 137/54 (!) 127/52  Pulse: (!) 52 (!) 50 (!) 48 (!) 44  Resp:      Temp:  98.4 F (36.9 C)  98.1 F (36.7 C)  TempSrc:  Oral  Oral  SpO2: 97% 98% 99% 98%  Weight:      Height:       Physical Exam Vitals and nursing note reviewed.  Constitutional:      General: She is not in acute distress. HENT:     Head: Normocephalic and atraumatic.  Cardiovascular:     Rate and Rhythm: Normal rate and regular rhythm.     Heart sounds: Normal heart sounds.  Pulmonary:     Effort: Pulmonary effort is normal.     Breath sounds: Normal breath sounds.  Abdominal:     Palpations: Abdomen is soft.     Tenderness: There is abdominal tenderness in the periumbilical area.  Neurological:     Mental Status: Mental status is at baseline.     Labs on Admission: I have personally reviewed following labs and imaging studies  CBC: Recent Labs  Lab 12/28/21 2137  WBC 4.8  HGB 11.1*  HCT 35.5*  MCV 84.9  PLT 016   Basic Metabolic Panel: Recent Labs  Lab 12/28/21 2137  NA 143  K 4.3  CL 103  CO2 28  GLUCOSE 104*  BUN 13  CREATININE 0.79  CALCIUM 9.4   GFR: Estimated Creatinine Clearance: 80.5 mL/min (by C-G formula based on SCr of 0.79 mg/dL). Liver Function Tests: Recent Labs  Lab 12/28/21 2137  AST 31  ALT 13  ALKPHOS 61  BILITOT 2.6*  PROT 7.5  ALBUMIN 3.7   Recent Labs  Lab 12/28/21 2137  LIPASE 31   No results for input(s): "AMMONIA" in the  last 168 hours. Coagulation Profile: No results for input(s): "INR", "PROTIME" in the last 168 hours. Cardiac Enzymes: No results for input(s): "CKTOTAL", "CKMB", "CKMBINDEX", "TROPONINI" in the last 168 hours. BNP (last 3 results) No results for input(s): "PROBNP" in the last 8760 hours. HbA1C: No results for input(s): "HGBA1C" in the last 72 hours. CBG: No results for input(s): "GLUCAP" in the last 168 hours. Lipid Profile: No results for input(s): "CHOL", "HDL", "LDLCALC", "TRIG", "CHOLHDL", "LDLDIRECT" in the last 72 hours. Thyroid Function Tests: No results for input(s): "TSH", "T4TOTAL", "FREET4", "T3FREE", "THYROIDAB" in the last 72 hours. Anemia Panel: No results for input(s): "VITAMINB12", "FOLATE", "FERRITIN", "TIBC", "IRON", "RETICCTPCT" in  the last 72 hours. Urine analysis:    Component Value Date/Time   COLORURINE YELLOW (A) 12/28/2021 2140   APPEARANCEUR CLEAR (A) 12/28/2021 2140   LABSPEC >1.046 (H) 12/28/2021 2140   PHURINE 6.0 12/28/2021 2140   GLUCOSEU NEGATIVE 12/28/2021 2140   HGBUR SMALL (A) 12/28/2021 2140   BILIRUBINUR NEGATIVE 12/28/2021 2140   KETONESUR 5 (A) 12/28/2021 2140   PROTEINUR NEGATIVE 12/28/2021 2140   NITRITE NEGATIVE 12/28/2021 2140   LEUKOCYTESUR MODERATE (A) 12/28/2021 2140    Radiological Exams on Admission: CT ABDOMEN PELVIS W CONTRAST  Result Date: 12/29/2021 CLINICAL DATA:  Abdominal pain for 2 days. EXAM: CT ABDOMEN AND PELVIS WITH CONTRAST TECHNIQUE: Multidetector CT imaging of the abdomen and pelvis was performed using the standard protocol following bolus administration of intravenous contrast. RADIATION DOSE REDUCTION: This exam was performed according to the departmental dose-optimization program which includes automated exposure control, adjustment of the mA and/or kV according to patient size and/or use of iterative reconstruction technique. CONTRAST:  128m OMNIPAQUE IOHEXOL 300 MG/ML  SOLN COMPARISON:  Aug 17, 2020 FINDINGS:  Lower chest: No acute abnormality. Hepatobiliary: No focal liver abnormality is seen. Status post cholecystectomy. No biliary dilatation. Pancreas: Unremarkable. No pancreatic ductal dilatation or surrounding inflammatory changes. Spleen: Punctate calcified granulomas are seen within the mildly enlarged spleen. Adrenals/Urinary Tract: Adrenal glands are unremarkable. Kidneys are normal in size, without renal calculi or hydronephrosis. Small bilateral simple renal cysts are noted. Bladder is unremarkable. Stomach/Bowel: There is a small hiatal hernia. Multiple adjacent surgical clips and surgical sutures are seen. Surgical clips are also seen along the splenic hilum. The appendix is not clearly identified. Small bowel loops within the mid and lower abdomen approach the upper limit of normal caliber. A dilated small bowel loop (3.5 cm) is seen within the mid left abdomen (axial CT image 54, CT series 2). A transition zone is seen within the mid left abdomen (axial CT images 54 through 57, CT series 2). Mildly thickened small bowel loops are also seen within the mid and lower abdomen, at and distal to the transition zone. Noninflamed diverticula are seen within the sigmoid colon. Vascular/Lymphatic: No significant vascular findings are present. No enlarged abdominal or pelvic lymph nodes. Reproductive: Status post hysterectomy. No adnexal masses. Other: A 6.1 cm x 2.8 cm ventral hernia is seen along the anterior aspect of the mid abdominal wall, to the right of midline. This contains fluid and a short segment of decompressed small bowel, and is increased in size when compared to the prior study. Musculoskeletal: Multilevel degenerative changes seen throughout the lumbar spine. IMPRESSION: 1. Moderate severity enteritis involving multiple loops of mid and distal small bowel, with additional findings consistent with a subsequent early partial small bowel obstruction. 2. Evidence of prior gastric bypass surgery, prior  cholecystectomy and prior hysterectomy. 3. Ventral hernia, as described above. 4. Sigmoid diverticulosis. 5. Small bilateral simple renal cysts. No additional follow-up or imaging is recommended. This recommendation follows ACR consensus guidelines: Management of the Incidental Renal Mass on CT: A White Paper of the ACR Incidental Findings Committee. J Am Coll Radiol [phone] Electronically Signed   By: TVirgina NorfolkM.D.   On: 12/29/2021 01:00     Data Reviewed: Relevant notes from primary care and specialist visits, past discharge summaries as available in EHR, including Care Everywhere. Prior diagnostic testing as pertinent to current admission diagnoses Updated medications and problem lists for reconciliation ED course, including vitals, labs, imaging, treatment and response to treatment Triage notes,  nursing and pharmacy notes and ED provider's notes Notable results as noted in HPI   Assessment and Plan: * Partial small bowel obstruction (HCC) Enteritis N.p.o. 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     DVT prophylaxis: Lovenox  Consults: surgery  Advance Care Planning:   Code Status: Prior   Family Communication: none  Disposition Plan: Back to previous home environment  Severity of Illness: The appropriate patient status for this patient is INPATIENT. Inpatient status is judged to be reasonable and necessary in order to provide the required intensity of service to ensure the patient's safety. The patient's presenting symptoms, physical exam findings, and initial radiographic and laboratory data in the context of their chronic comorbidities is felt to place them at high risk for further clinical  deterioration. Furthermore, it is not anticipated that the patient will be medically stable for discharge from the hospital within 2 midnights of admission.   * I certify that at the point of admission it is my clinical judgment that the patient will require inpatient hospital care spanning beyond 2 midnights from the point of admission due to high intensity of service, high risk for further deterioration and high frequency of surveillance required.*  Author: Athena Masse, MD 12/29/2021 4:44 AM  For on call review [email or web address].

## 2021-12-29 NOTE — Assessment & Plan Note (Signed)
Patient was on Suboxone at home which is being held as she is currently receiving opioids. -Continue with current pain management, we will restart home Suboxone on discharge

## 2021-12-29 NOTE — ED Provider Notes (Signed)
[facility] Provider Note    Event Date/Time   First MD Initiated Contact with Patient 12/28/21 2359     (approximate)   History   Abdominal Pain   HPI  Morgan Dixon is a 71 y.o. female who presents to the ED for evaluation of Abdominal Pain   I reviewed outpatient pain clinic visit from June.  History of chronic pain syndrome on buprenorphine due to leg and back pain. Otherwise history of obesity, CVA on Plavix.  Patient presents to the ED for evaluation of 2 days of consistent periumbilical and generalized abdominal pain with nausea.  Also reports that she has not been taking her typical chronic pain medicine, because she "did not feel like it."  No fever, diarrhea, dysuria or emesis.  Physical Exam   Triage Vital Signs: ED Triage Vitals  Enc Vitals Group     BP 12/28/21 2139 (!) 162/67     Pulse Rate 12/28/21 2139 62     Resp 12/28/21 2139 18     Temp 12/28/21 2139 98.3 F (36.8 C)     Temp Source 12/28/21 2139 Oral     SpO2 12/28/21 2139 97 %     Weight 12/28/21 2135 240 lb (108.9 kg)     Height 12/28/21 2135 '5\' 6"'$  (1.676 m)     Head Circumference --      Peak Flow --      Pain Score 12/28/21 2135 9     Pain Loc --      Pain Edu? --      Excl. in [place]? --     Most recent vital signs: Vitals:   12/29/21 0300 12/29/21 0330  BP: (!) 137/54 (!) 127/52  Pulse: (!) 48 (!) 44  Resp:    Temp:  98.1 F (36.7 C)  SpO2: 99% 98%    General: Awake, no distress.  CV:  Good peripheral perfusion.  Resp:  Normal effort.  Abd:  No distention.  Diffuse tenderness is mild, but she does have a firm palpable mass just superior and to the right of her umbilicus.  After CT imaging, I am able to reduce this firm mass with subsequent resolution of her pain. MSK:  No deformity noted.  Neuro:  No focal deficits appreciated. Other:     ED Results / Procedures / Treatments   Labs (all labs ordered are listed, but only abnormal results are  displayed) Labs Reviewed  COMPREHENSIVE METABOLIC PANEL - Abnormal; Notable for the following components:      Result Value   Glucose, Bld 104 (*)    Total Bilirubin 2.6 (*)    All other components within normal limits  CBC - Abnormal; Notable for the following components:   Hemoglobin 11.1 (*)    HCT 35.5 (*)    All other components within normal limits  URINALYSIS, ROUTINE W REFLEX MICROSCOPIC - Abnormal; Notable for the following components:   Color, Urine YELLOW (*)    APPearance CLEAR (*)    Specific Gravity, Urine >1.046 (*)    Hgb urine dipstick SMALL (*)    Ketones, ur 5 (*)    Leukocytes,Ua MODERATE (*)    All other components within normal limits  URINE CULTURE  LIPASE, BLOOD    EKG   RADIOLOGY CT abdomen/pelvis interpreted by me with ventral hernia and possible partial small bowel obstruction.  Official radiology report(s): CT ABDOMEN PELVIS W CONTRAST  Result Date: 12/29/2021 CLINICAL DATA:  Abdominal pain for 2  days. EXAM: CT ABDOMEN AND PELVIS WITH CONTRAST TECHNIQUE: Multidetector CT imaging of the abdomen and pelvis was performed using the standard protocol following bolus administration of intravenous contrast. RADIATION DOSE REDUCTION: This exam was performed according to the departmental dose-optimization program which includes automated exposure control, adjustment of the mA and/or kV according to patient size and/or use of iterative reconstruction technique. CONTRAST:  146m OMNIPAQUE IOHEXOL 300 MG/ML  SOLN COMPARISON:  Aug 17, 2020 FINDINGS: Lower chest: No acute abnormality. Hepatobiliary: No focal liver abnormality is seen. Status post cholecystectomy. No biliary dilatation. Pancreas: Unremarkable. No pancreatic ductal dilatation or surrounding inflammatory changes. Spleen: Punctate calcified granulomas are seen within the mildly enlarged spleen. Adrenals/Urinary Tract: Adrenal glands are unremarkable. Kidneys are normal in size, without renal calculi or  hydronephrosis. Small bilateral simple renal cysts are noted. Bladder is unremarkable. Stomach/Bowel: There is a small hiatal hernia. Multiple adjacent surgical clips and surgical sutures are seen. Surgical clips are also seen along the splenic hilum. The appendix is not clearly identified. Small bowel loops within the mid and lower abdomen approach the upper limit of normal caliber. A dilated small bowel loop (3.5 cm) is seen within the mid left abdomen (axial CT image 54, CT series 2). A transition zone is seen within the mid left abdomen (axial CT images 54 through 57, CT series 2). Mildly thickened small bowel loops are also seen within the mid and lower abdomen, at and distal to the transition zone. Noninflamed diverticula are seen within the sigmoid colon. Vascular/Lymphatic: No significant vascular findings are present. No enlarged abdominal or pelvic lymph nodes. Reproductive: Status post hysterectomy. No adnexal masses. Other: A 6.1 cm x 2.8 cm ventral hernia is seen along the anterior aspect of the mid abdominal wall, to the right of midline. This contains fluid and a short segment of decompressed small bowel, and is increased in size when compared to the prior study. Musculoskeletal: Multilevel degenerative changes seen throughout the lumbar spine. IMPRESSION: 1. Moderate severity enteritis involving multiple loops of mid and distal small bowel, with additional findings consistent with a subsequent early partial small bowel obstruction. 2. Evidence of prior gastric bypass surgery, prior cholecystectomy and prior hysterectomy. 3. Ventral hernia, as described above. 4. Sigmoid diverticulosis. 5. Small bilateral simple renal cysts. No additional follow-up or imaging is recommended. This recommendation follows ACR consensus guidelines: Management of the Incidental Renal Mass on CT: A White Paper of the ACR Incidental Findings Committee. J Am Coll Radiol [id number] Electronically Signed   By: TVirgina NorfolkM.D.   On: 12/29/2021 01:00    PROCEDURES and INTERVENTIONS:  Hernia reduction  Date/Time: 12/29/2021 2:09 AM  Performed by: SVladimir Crofts MD Authorized by: SVladimir Crofts MD  Consent: Verbal consent obtained. Risks and benefits: risks, benefits and alternatives were discussed Local anesthesia used: no  Anesthesia: Local anesthesia used: no  Sedation: Patient sedated: no  Patient tolerance: patient tolerated the procedure well with no immediate complications Comments: After CT imaging, with breathing exercises to assist, is able to reduce ventral hernia.     Medications  iohexol (OMNIPAQUE) 300 MG/ML solution 100 mL (100 mLs Intravenous Contrast Given 12/29/21 0027)  buprenorphine (SUBUTEX) sublingual tablet 8 mg (8 mg Sublingual Given 12/29/21 0232)  pregabalin (LYRICA) capsule 100 mg (100 mg Oral Given 12/29/21 0112)  tiZANidine (ZANAFLEX) tablet 4 mg (4 mg Oral Given 12/29/21 0112)  topiramate (TOPAMAX) tablet 25 mg (25 mg Oral Given 12/29/21 0112)  lactated ringers bolus 1,000 mL (0 mLs Intravenous Stopped  12/29/21 0326)  fosfomycin (MONUROL) packet 3 g (3 g Oral Given 12/29/21 0314)     IMPRESSION / MDM / ASSESSMENT AND PLAN / ED COURSE  I reviewed the triage vital signs and the nursing notes.  Differential diagnosis includes, but is not limited to, SBO, opiate withdrawals, hernia, pancreatitis, cystitis  {Patient presents with symptoms of an acute illness or injury that is potentially life-threatening.  71 year old female presents to the ED with abdominal pain and nausea, with evidence of a ventral hernia, possible cystitis and partial small bowel obstruction, requiring medical admission.  She seems uncomfortable, diffuse abdominal tenderness with a palpable periumbilical abdominal mass, which I reduce.  After reduction, she does have significant treatment of her pain, but remains quite tender to palpation.  Blood work is generally reassuring with a normal CMP and no  leukocytosis or signs of sepsis on CBC.  Normal lipase.  Urine with moderate leukocytes, but she has no urinary symptoms.  This was sent for culture and she was provided a single dose of fosfomycin.  CT questions SBO due to dilated loops of bowel alongside enteritis, and with her persistent tenderness and nausea I suspect this is a continued source of her symptoms.  We will consult medicine for admission.  Clinical Course as of 12/29/21 0348  Fri Dec 29, 2021  0106 Ventral hernia reduced by me, improved pain. Then get the call from rads regarding CT [DS]  0126 Reassessed.  Still feels well.  She is appreciative.  [DS]  0151 Reassessed. No pain [DS]  9937 Reassessed.  Denies any pain, but is still quite tender when examined her abdomen to left-sided lower quadrants.  We discussed CT findings with enteritis and possible partial small bowel obstruction.  She is uncomfortable going home, I think a observation admission is reasonable.  We will consult medicine [DS]    Clinical Course User Index [DS] Vladimir Crofts, MD     FINAL CLINICAL IMPRESSION(S) / ED DIAGNOSES   Final diagnoses:  Periumbilical abdominal pain  Ventral hernia without obstruction or gangrene  Partial small bowel obstruction ([place])     Rx / DC Orders   ED Discharge Orders     None        Note:  This document was prepared using Dragon voice recognition software and may include unintentional dictation errors.   Vladimir Crofts, MD 12/29/21 [phone]

## 2021-12-29 NOTE — Assessment & Plan Note (Addendum)
Secondary to incarcerated ventral hernia s/p robotic laparoscopic repair by general surgery.  Patient tolerated the procedure well. Abdominal pain improving.  Currently tolerating clear liquid diet.  No bowel movement yet. -Continue with pain management -Advance diet as tolerated -Mobilize patient -PT is recommending SNF

## 2021-12-29 NOTE — Assessment & Plan Note (Signed)
Blood pressure within goal. -Continue home losartan

## 2021-12-30 ENCOUNTER — Inpatient Hospital Stay: Payer: Medicare HMO | Admitting: General Practice

## 2021-12-30 ENCOUNTER — Encounter
Admission: EM | Disposition: A | Discharge status: Home / Self Care | Payer: Self-pay | Source: Home / Self Care | Attending: Internal Medicine

## 2021-12-30 ENCOUNTER — Inpatient Hospital Stay: Payer: Medicare HMO

## 2021-12-30 ENCOUNTER — Other Ambulatory Visit: Payer: Self-pay

## 2021-12-30 DIAGNOSIS — K430 Incisional hernia with obstruction, without gangrene: Principal | ICD-10-CM

## 2021-12-30 DIAGNOSIS — R1033 Periumbilical pain: Secondary | ICD-10-CM | POA: Insufficient documentation

## 2021-12-30 DIAGNOSIS — K56600 Partial intestinal obstruction, unspecified as to cause: Secondary | ICD-10-CM | POA: Diagnosis not present

## 2021-12-30 HISTORY — PX: XI ROBOTIC ASSISTED VENTRAL HERNIA: SHX6789

## 2021-12-30 HISTORY — PX: ROBOTIC ASSISTED LAPAROSCOPIC LYSIS OF ADHESION: SHX6080

## 2021-12-30 LAB — LACTIC ACID, PLASMA: Lactic Acid, Venous: 0.7 mmol/L (ref 0.5–1.9)

## 2021-12-30 LAB — CBC
HCT: 33.2 % — ABNORMAL LOW (ref 36.0–46.0)
Hemoglobin: 10.6 g/dL — ABNORMAL LOW (ref 12.0–15.0)
MCH: 27 pg (ref 26.0–34.0)
MCHC: 31.9 g/dL (ref 30.0–36.0)
MCV: 84.7 fL (ref 80.0–100.0)
Platelets: 150 10*3/uL (ref 150–400)
RBC: 3.92 MIL/uL (ref 3.87–5.11)
RDW: 15.9 % — ABNORMAL HIGH (ref 11.5–15.5)
WBC: 4.6 10*3/uL (ref 4.0–10.5)
nRBC: 0 % (ref 0.0–0.2)

## 2021-12-30 LAB — COMPREHENSIVE METABOLIC PANEL
ALT: 14 U/L (ref 0–44)
AST: 36 U/L (ref 15–41)
Albumin: 3.5 g/dL (ref 3.5–5.0)
Alkaline Phosphatase: 63 U/L (ref 38–126)
Anion gap: 12 (ref 5–15)
BUN: 11 mg/dL (ref 8–23)
CO2: 25 mmol/L (ref 22–32)
Calcium: 9.3 mg/dL (ref 8.9–10.3)
Chloride: 104 mmol/L (ref 98–111)
Creatinine, Ser: 0.78 mg/dL (ref 0.44–1.00)
GFR, Estimated: >60 mL/min (ref >60)
Glucose, Bld: 82 mg/dL (ref 70–99)
Potassium: 3.7 mmol/L (ref 3.5–5.1)
Sodium: 141 mmol/L (ref 135–145)
Total Bilirubin: 2.2 mg/dL — ABNORMAL HIGH (ref 0.3–1.2)
Total Protein: 6.9 g/dL (ref 6.5–8.1)

## 2021-12-30 LAB — URINE CULTURE: Culture: <10000 — AB

## 2021-12-30 LAB — SURGICAL PCR SCREEN
MRSA, PCR: NEGATIVE
Staphylococcus aureus: NEGATIVE

## 2021-12-30 SURGERY — REPAIR, HERNIA, VENTRAL, ROBOT-ASSISTED
Anesthesia: General | Site: Abdomen

## 2021-12-30 MED ORDER — CIPROFLOXACIN IN D5W 400 MG/200ML IV SOLN
400.0000 mg | Freq: Once | INTRAVENOUS | Status: AC
  Administered 2021-12-30: 400 mg via INTRAVENOUS
  Filled 2021-12-30: qty 200

## 2021-12-30 MED ORDER — HYDROMORPHONE HCL 1 MG/ML IJ SOLN
1.0000 mg | INTRAMUSCULAR | Status: DC | PRN
  Administered 2021-12-30 – 2021-12-31 (×2): 1 mg via INTRAVENOUS
  Filled 2021-12-30 (×2): qty 1

## 2021-12-30 MED ORDER — GLYCOPYRROLATE 0.2 MG/ML IJ SOLN
INTRAMUSCULAR | Status: DC | PRN
  Administered 2021-12-30: .1 mg via INTRAVENOUS

## 2021-12-30 MED ORDER — LIDOCAINE HCL (CARDIAC) PF 100 MG/5ML IV SOSY
PREFILLED_SYRINGE | INTRAVENOUS | Status: DC | PRN
  Administered 2021-12-30: 100 mg via INTRAVENOUS

## 2021-12-30 MED ORDER — PROPOFOL 10 MG/ML IV BOLUS
INTRAVENOUS | Status: AC
  Filled 2021-12-30: qty 20

## 2021-12-30 MED ORDER — MIDAZOLAM HCL 2 MG/2ML IJ SOLN
INTRAMUSCULAR | Status: DC | PRN
  Administered 2021-12-30: 2 mg via INTRAVENOUS

## 2021-12-30 MED ORDER — GLYCOPYRROLATE 0.2 MG/ML IJ SOLN
INTRAMUSCULAR | Status: AC
  Filled 2021-12-30: qty 1

## 2021-12-30 MED ORDER — PROPOFOL 10 MG/ML IV BOLUS
INTRAVENOUS | Status: DC | PRN
  Administered 2021-12-30: 200 mg via INTRAVENOUS

## 2021-12-30 MED ORDER — ROCURONIUM BROMIDE 10 MG/ML (PF) SYRINGE
PREFILLED_SYRINGE | INTRAVENOUS | Status: AC
  Filled 2021-12-30: qty 10

## 2021-12-30 MED ORDER — DEXAMETHASONE SODIUM PHOSPHATE 10 MG/ML IJ SOLN
INTRAMUSCULAR | Status: DC | PRN
  Administered 2021-12-30: 10 mg via INTRAVENOUS

## 2021-12-30 MED ORDER — METRONIDAZOLE 500 MG/100ML IV SOLN
500.0000 mg | Freq: Once | INTRAVENOUS | Status: DC
  Filled 2021-12-30: qty 100

## 2021-12-30 MED ORDER — BUPIVACAINE-EPINEPHRINE 0.25% -1:200000 IJ SOLN
INTRAMUSCULAR | Status: DC | PRN
  Administered 2021-12-30: 30 mL

## 2021-12-30 MED ORDER — FENTANYL CITRATE (PF) 100 MCG/2ML IJ SOLN
INTRAMUSCULAR | Status: DC | PRN
  Administered 2021-12-30: 100 ug via INTRAVENOUS

## 2021-12-30 MED ORDER — FENTANYL CITRATE (PF) 100 MCG/2ML IJ SOLN
INTRAMUSCULAR | Status: AC
  Filled 2021-12-30: qty 2

## 2021-12-30 MED ORDER — DEXAMETHASONE SODIUM PHOSPHATE 10 MG/ML IJ SOLN
INTRAMUSCULAR | Status: AC
  Filled 2021-12-30: qty 1

## 2021-12-30 MED ORDER — ROCURONIUM BROMIDE 100 MG/10ML IV SOLN
INTRAVENOUS | Status: DC | PRN
  Administered 2021-12-30: 50 mg via INTRAVENOUS
  Administered 2021-12-30: 20 mg via INTRAVENOUS

## 2021-12-30 MED ORDER — OXYCODONE HCL 5 MG PO TABS: 5.0000 mg | ORAL_TABLET | Freq: Once | ORAL | Status: DC | PRN

## 2021-12-30 MED ORDER — DEXMEDETOMIDINE HCL IN NACL 200 MCG/50ML IV SOLN
INTRAVENOUS | Status: DC | PRN
  Administered 2021-12-30 (×4): 10 ug via INTRAVENOUS

## 2021-12-30 MED ORDER — METRONIDAZOLE 500 MG/100ML IV SOLN
500.0000 mg | Freq: Once | INTRAVENOUS | Status: AC
  Administered 2021-12-30: 1000 mg via INTRAVENOUS
  Filled 2021-12-30: qty 100

## 2021-12-30 MED ORDER — SUGAMMADEX SODIUM 200 MG/2ML IV SOLN
INTRAVENOUS | Status: DC | PRN
  Administered 2021-12-30: 300 mg via INTRAVENOUS

## 2021-12-30 MED ORDER — LIDOCAINE HCL (PF) 2 % IJ SOLN
INTRAMUSCULAR | Status: AC
  Filled 2021-12-30: qty 5

## 2021-12-30 MED ORDER — IOHEXOL 300 MG/ML  SOLN
100.0000 mL | Freq: Once | INTRAMUSCULAR | Status: AC | PRN
  Administered 2021-12-30: 100 mL via INTRAVENOUS

## 2021-12-30 MED ORDER — CIPROFLOXACIN IN D5W 400 MG/200ML IV SOLN
400.0000 mg | Freq: Two times a day (BID) | INTRAVENOUS | Status: AC
  Administered 2021-12-31: 400 mg via INTRAVENOUS
  Filled 2021-12-30: qty 200

## 2021-12-30 MED ORDER — HYDROCODONE-ACETAMINOPHEN 5-325 MG PO TABS
1.0000 | ORAL_TABLET | ORAL | Status: DC | PRN
  Administered 2021-12-31: 2 via ORAL
  Administered 2022-01-03: 1 via ORAL
  Administered 2022-01-03 – 2022-01-06 (×8): 2 via ORAL
  Filled 2021-12-30 (×5): qty 2
  Filled 2021-12-30: qty 1
  Filled 2021-12-30 (×4): qty 2

## 2021-12-30 MED ORDER — BUPIVACAINE-EPINEPHRINE (PF) 0.25% -1:200000 IJ SOLN
INTRAMUSCULAR | Status: AC
  Filled 2021-12-30: qty 30

## 2021-12-30 MED ORDER — SUGAMMADEX SODIUM 500 MG/5ML IV SOLN
INTRAVENOUS | Status: AC
  Filled 2021-12-30: qty 5

## 2021-12-30 MED ORDER — KETAMINE HCL 50 MG/5ML IJ SOSY
PREFILLED_SYRINGE | INTRAMUSCULAR | Status: AC
  Filled 2021-12-30: qty 5

## 2021-12-30 MED ORDER — SUCCINYLCHOLINE CHLORIDE 200 MG/10ML IV SOSY
PREFILLED_SYRINGE | INTRAVENOUS | Status: AC
  Filled 2021-12-30: qty 10

## 2021-12-30 MED ORDER — MIDAZOLAM HCL 2 MG/2ML IJ SOLN
INTRAMUSCULAR | Status: AC
  Filled 2021-12-30: qty 2

## 2021-12-30 MED ORDER — OXYCODONE HCL 5 MG/5ML PO SOLN: 5.0000 mg | Freq: Once | ORAL | Status: DC | PRN

## 2021-12-30 MED ORDER — 0.9 % SODIUM CHLORIDE (POUR BTL) OPTIME
TOPICAL | Status: DC | PRN
  Administered 2021-12-30: 500 mL

## 2021-12-30 MED ORDER — SUCCINYLCHOLINE CHLORIDE 200 MG/10ML IV SOSY
PREFILLED_SYRINGE | INTRAVENOUS | Status: DC | PRN
  Administered 2021-12-30: 140 mg via INTRAVENOUS

## 2021-12-30 MED ORDER — METRONIDAZOLE IVPB CUSTOM: 1000.0000 mg | Freq: Once | INTRAVENOUS | Status: DC

## 2021-12-30 MED ORDER — ACETAMINOPHEN 10 MG/ML IV SOLN
INTRAVENOUS | Status: AC
  Filled 2021-12-30: qty 100

## 2021-12-30 MED ORDER — ACETAMINOPHEN 10 MG/ML IV SOLN
INTRAVENOUS | Status: DC | PRN
  Administered 2021-12-30: 1000 mg via INTRAVENOUS

## 2021-12-30 MED ORDER — BUPIVACAINE LIPOSOME 1.3 % IJ SUSP
INTRAMUSCULAR | Status: AC
  Filled 2021-12-30: qty 20

## 2021-12-30 MED ORDER — DEXMEDETOMIDINE HCL IN NACL 80 MCG/20ML IV SOLN
INTRAVENOUS | Status: AC
  Filled 2021-12-30: qty 20

## 2021-12-30 MED ORDER — KETAMINE HCL 50 MG/ML IJ SOLN
INTRAMUSCULAR | Status: DC | PRN
  Administered 2021-12-30: 30 mg via INTRAMUSCULAR
  Administered 2021-12-30: 20 mg via INTRAMUSCULAR

## 2021-12-30 MED ORDER — ONDANSETRON HCL 4 MG/2ML IJ SOLN
INTRAMUSCULAR | Status: AC
  Filled 2021-12-30: qty 2

## 2021-12-30 MED ORDER — FENTANYL CITRATE (PF) 100 MCG/2ML IJ SOLN: 25.0000 ug | INTRAMUSCULAR | Status: DC | PRN

## 2021-12-30 SURGICAL SUPPLY — 49 items
CHLORAPREP W/TINT 26 (MISCELLANEOUS) ×1 IMPLANT
COVER TIP SHEARS 8 DVNC (MISCELLANEOUS) ×1 IMPLANT
COVER TIP SHEARS 8MM DA VINCI (MISCELLANEOUS) ×1
COVER WAND RF STERILE (DRAPES) ×1 IMPLANT
DERMABOND ADVANCED .7 DNX12 (GAUZE/BANDAGES/DRESSINGS) ×1 IMPLANT
DRAPE ARM DVNC X/XI (DISPOSABLE) ×3 IMPLANT
DRAPE COLUMN DVNC XI (DISPOSABLE) ×1 IMPLANT
DRAPE DA VINCI XI ARM (DISPOSABLE) ×3
DRAPE DA VINCI XI COLUMN (DISPOSABLE) ×1
ELECT REM PT RETURN 9FT ADLT (ELECTROSURGICAL) ×1
ELECTRODE REM PT RTRN 9FT ADLT (ELECTROSURGICAL) ×1 IMPLANT
GLOVE ORTHO TXT STRL SZ7.5 (GLOVE) ×2 IMPLANT
GLOVE SURG SYN 7.0 (GLOVE) ×2 IMPLANT
GLOVE SURG SYN 7.0 PF PI (GLOVE) IMPLANT
GOWN STRL REUS W/ TWL LRG LVL3 (GOWN DISPOSABLE) ×1 IMPLANT
GOWN STRL REUS W/ TWL XL LVL3 (GOWN DISPOSABLE) ×2 IMPLANT
GOWN STRL REUS W/TWL LRG LVL3 (GOWN DISPOSABLE) ×1
GOWN STRL REUS W/TWL XL LVL3 (GOWN DISPOSABLE) ×2
GRASPER SUT TROCAR 14GX15 (MISCELLANEOUS) IMPLANT
IRRIGATION STRYKERFLOW (MISCELLANEOUS) IMPLANT
IRRIGATOR STRYKERFLOW (MISCELLANEOUS)
IV NS IRRIG 3000ML ARTHROMATIC (IV SOLUTION) IMPLANT
KIT PINK PAD W/HEAD ARE REST (MISCELLANEOUS) ×1 IMPLANT
KIT PINK PAD W/HEAD ARM REST (MISCELLANEOUS) ×1 IMPLANT
KIT TURNOVER KIT A (KITS) ×1 IMPLANT
MANIFOLD NEPTUNE II (INSTRUMENTS) ×1 IMPLANT
MESH VENTRALIGHT ST W/ECHO 6IN (Mesh General) IMPLANT
NDL INSUFFLATION 14GA 120MM (NEEDLE) ×1 IMPLANT
NEEDLE HYPO 22GX1.5 SAFETY (NEEDLE) ×1 IMPLANT
NEEDLE INSUFFLATION 14GA 120MM (NEEDLE) ×1 IMPLANT
NS IRRIG 500ML POUR BTL (IV SOLUTION) ×1 IMPLANT
PACK LAP CHOLECYSTECTOMY (MISCELLANEOUS) ×1 IMPLANT
SCISSORS METZENBAUM CVD 33 (INSTRUMENTS) IMPLANT
SEAL CANN UNIV 5-8 DVNC XI (MISCELLANEOUS) ×3 IMPLANT
SEAL XI 5MM-8MM UNIVERSAL (MISCELLANEOUS) ×3
SET TUBE SMOKE EVAC HIGH FLOW (TUBING) ×1 IMPLANT
SOLUTION ELECTROLUBE (MISCELLANEOUS) ×1 IMPLANT
SPIKE FLUID TRANSFER (MISCELLANEOUS) ×1 IMPLANT
SUT MNCRL 4-0 (SUTURE) ×1
SUT MNCRL 4-0 27 PS-2 XMFL (SUTURE) ×1
SUT MNCRL 4-0 27XMFL (SUTURE) ×1
SUT STRATAFIX 0 PDS+ CT-2 23 (SUTURE) ×2
SUT VICRYL 0 AB UR-6 (SUTURE) ×1 IMPLANT
SUT VLOC 90 S/L VL9 GS22 (SUTURE) ×2 IMPLANT
SUTURE MNCRL 4-0 27XMF (SUTURE) ×1 IMPLANT
SUTURE STRATFX 0 PDS+ CT-2 23 (SUTURE) ×1 IMPLANT
TRAP FLUID SMOKE EVACUATOR (MISCELLANEOUS) ×1 IMPLANT
TROCAR Z-THREAD FIOS 11X100 BL (TROCAR) ×1 IMPLANT
WATER STERILE IRR 500ML POUR (IV SOLUTION) ×1 IMPLANT

## 2021-12-30 NOTE — Op Note (Signed)
Robotic assisted laparoscopic incarcerated (AA) incisional hernia (fascial defect 3-10cm) repair IPOM using  round 15 cm ventralight BARD mesh   Pre-operative Diagnosis: Incarcerated (AA) incisional hernia (fascial defect 3-10cm)    Post-operative Diagnosis: same   Surgeon:  Ronny Bacon, M.D., FACS   Anesthesia: Gen. with endotracheal tube   Findings: See images, and inferior 2 cm cephalad to caudad by 3 cm wide defect, without incarcerated content.  There is 4 cm between this defect and the defect which had the incarcerated small bowel within it.  The incarcerated fascial defect measured 5 cm wide by 4 cm craniocaudad. The bowel was reduced after induction of anesthesia.  And during laparoscopy following lysis of adhesions and repeat reduction surgically confirmed viable small bowel without defect.  Estimated Blood Loss: 50 mL   Complications: none           Procedure Details  The patient was seen again in the Holding Room. The benefits, complications, treatment options, and expected outcomes were discussed with the patient. The risks of bleeding, infection, recurrence of symptoms, failure to resolve symptoms, bowel injury, mesh placement, mesh infection, any of which could require further surgery were reviewed with the patient. The likelihood of improving the patient's symptoms with return to their baseline status is good.  The patient and/or family concurred with the proposed plan, giving informed consent.  The patient was taken to Operating Room, identified and the procedure verified.  A Time Out was held and the above information confirmed.   Prior to the induction of general anesthesia, antibiotic prophylaxis was administered. VTE prophylaxis was in place. General endotracheal anesthesia was then administered and tolerated well. After the induction, the abdomen was prepped with Chloraprep and draped in the sterile fashion. The patient was positioned in the supine position.  After  local infiltration of quarter percent Marcaine with epinephrine, stab incision was made left upper quadrant.  Just below the costal margin approximately midclavicular line the Veress needle is passed with sensation of the layers to penetrate the abdominal wall and into the peritoneum.  Saline drop test is confirmed peritoneal placement.  Insufflation is initiated with carbon dioxide to pressures of 15 mmHg. Marcaine quarter percent was used to inject all the incision sites. We used a left lower quadrant immediately adjacent to the anterior superior iliac spine/crest incision and using an optical FIOS 11 mm trocar, it was inserted with direct visualization and pneumoperitoneum transferred.  No hemodynamic compromise, no evidence of injury..   2 additional 8 mm ports were placed under direct visualization on the left upper lateral abdomen.  I visualized the anterior abdominal wall adhesions.  And after lysis of those adhesions, consisting over 30 minutes of dissection.  We were able to visualize the anterior abdominal wall fascial defects and there was 2 hernias measuring approximately 2 x 3 and 4 x 5 cm separated by 4 cm intact midline fascial bridge. The robot was brought to the surgical field and docked in the standard fashion.  We made sure that all instrumentation was kept under direct vision at all times and there was no collision between the arms.  I scrubbed out and went to the console. Adjacent preperitoneal omental, adipose and umbilical ligaments were taken down with electrocautery to allow adequate mesh placement to be secured to the fascial tissue. Confirm and measured that the cumulative defect size from cranial to caudad measured defect was 10 cm.    Using two 0 strata fix sutures we closed the ventral defect primarily.  The lines of tension in the oval configuration showed a shorter distance to close them horizontally.  This created a total of 6 cm, enabling a 4.5 cm overlap from cephalad to  caudad.  The PMI was used to pierce the center for echo assisted 15 cm diameter round Ventralex mesh placement.  Using the mesh and the echo location device we were able to bring the mesh towards the abdominal wall. I used the same suture to additionally secure the mesh and centered it over the fascial closure.    The mesh was secured circumferentially to the abdominal wall using three 2-0 V-lock in the standard fashion.   The mesh appeared well secured against the  abdominal wall. A second look laparoscopy revealed no evidence of intra-abdominal injury.    All the needles and ECHO device were removed under direct visualization.  The instruments were removed and the robot was undocked.  The laparoscopic ports were removed under direct visualization and the pneumoperitoneum was deflated.   The fascial sutures approximated in the standard fashion,  utilizing the PMI under direct visualization. Incisions were closed with  4-0 Monocryl   Dermabond was used to coat the skin.  Patient tolerated procedure well and there were no immediate complications. Needle and laparotomy counts were correct    Ronny Bacon M.D., [facility] 12/30/2021 7:24 PM

## 2021-12-30 NOTE — Transfer of Care (Signed)
Immediate Anesthesia Transfer of Care Note  Patient: Morgan Dixon  Procedure(s) Performed: XI ROBOTIC ASSISTED VENTRAL HERNIA (Abdomen) XI ROBOTIC ASSISTED LAPAROSCOPIC LYSIS OF ADHESION (Abdomen)  Patient Location: PACU  Anesthesia Type:General  Level of Consciousness: drowsy and patient cooperative  Airway & Oxygen Therapy: Patient Spontanous Breathing and Patient connected to nasal cannula oxygen  Post-op Assessment: Report given to RN and Post -op Vital signs reviewed and stable  Post vital signs: Reviewed and stable  Last Vitals:  Vitals Value Taken Time  BP 143/64 12/30/21 1930  Temp 36.3 C 12/30/21 1930  Pulse 64 12/30/21 1933  Resp 19 12/30/21 1933  SpO2 95 % 12/30/21 1933  Vitals shown include unvalidated device data.  Last Pain:  Vitals:   12/30/21 1423  TempSrc:   PainSc: Asleep         Complications: No notable events documented.

## 2021-12-30 NOTE — Anesthesia Procedure Notes (Signed)
Procedure Name: Intubation Date/Time: 12/30/2021 4:52 PM  Performed by: Orion Crook, CRNAPre-anesthesia Checklist: Patient identified, Emergency Drugs available, Suction available, Patient being monitored and Timeout performed Patient Re-evaluated:Patient Re-evaluated prior to induction Oxygen Delivery Method: Circle system utilized Preoxygenation: Pre-oxygenation with 100% oxygen Induction Type: IV induction, Rapid sequence and Cricoid Pressure applied Grade View: Grade II Tube type: Oral Tube size: 7.0 mm Number of attempts: 1 Placement Confirmation: ETT inserted through vocal cords under direct vision, positive ETCO2, CO2 detector and breath sounds checked- equal and bilateral Secured at: 23 cm Tube secured with: Tape Dental Injury: Teeth and Oropharynx as per pre-operative assessment

## 2021-12-30 NOTE — Plan of Care (Signed)
  Problem: Pain Managment: Goal: General experience of comfort will improve Outcome: Progressing   Problem: Safety: Goal: Ability to remain free from injury will improve Outcome: Progressing   Problem: Skin Integrity: Goal: Risk for impaired skin integrity will decrease Outcome: Progressing   

## 2021-12-30 NOTE — Progress Notes (Signed)
[facility] SURGICAL PROGRESS NOTE (cpt [place] [facility] Day(s): 1.   Post op day(s): Day of Surgery.   Interval History: Patient seen and examined, no acute events or new complaints overnight. Patient reports continued RUQ abdominal wall pain, denies vomiting, flatus or bowel function.  Review of Systems:  Constitutional: denies fever, chills  HEENT: denies cough or congestion  Respiratory: denies any shortness of breath  Cardiovascular: denies chest pain or palpitations  Genitourinary: denies burning with urination or urinary frequency Musculoskeletal: denies pain, decreased motor or sensation Integumentary: denies any other rashes or skin discolorations Neurological: denies HA or vision/hearing changes   Vital signs in last 24 hours: [min-max] current  Temp:  [97.9 F (36.6 C)-98.8 F (37.1 C)] 98.8 F (37.1 C) (09/16 0916) Pulse Rate:  [52-75] 75 (09/16 0916) Resp:  [16-19] 19 (09/16 0916) BP: (161-182)/(58-68) 161/68 (09/16 0916) SpO2:  [95 %-96 %] 95 % (09/16 0916)     Height: '5\' 6"'$  (167.6 cm) Weight: 108.9 kg BMI (Calculated): 38.76   Intake/Output last 2 shifts:  09/15 0701 - 09/16 0700 In: 1454.7 [I.V.:1454.7] Out: -    Physical Exam:  Constitutional: alert, cooperative and no distress  HENT: normocephalic without obvious abnormality  Eyes: PERRL, EOM's grossly intact and symmetric  Neuro: CN II - XII grossly intact and symmetric without deficit  Respiratory: breathing non-labored at rest  Cardiovascular: regular rate and sinus rhythm  Gastrointestinal: RUQ mass at junction of subcostal scar and midline scar, tender, irreducible, otherwise non-tender, and non-distended Musculoskeletal: UE and LE FROM, no edema or wounds, motor and sensation grossly intact, NT    Labs:     Latest Ref Rng & Units 12/30/2021    6:12 AM 12/29/2021    5:16 AM 12/28/2021    9:37 PM  CBC  WBC 4.0 - 10.5 K/uL 4.6  5.0  4.8   Hemoglobin 12.0 - 15.0 g/dL 10.6  9.8   11.1   Hematocrit 36.0 - 46.0 % 33.2  31.7  35.5   Platelets 150 - 400 K/uL 150  148  175       Latest Ref Rng & Units 12/30/2021    6:12 AM 12/29/2021    5:16 AM 12/28/2021    9:37 PM  CMP  Glucose 70 - 99 mg/dL 82   104   BUN 8 - 23 mg/dL 11   13   Creatinine 0.44 - 1.00 mg/dL 0.78  0.82  0.79   Sodium 135 - 145 mmol/L 141   143   Potassium 3.5 - 5.1 mmol/L 3.7   4.3   Chloride 98 - 111 mmol/L 104   103   CO2 22 - 32 mmol/L 25   28   Calcium 8.9 - 10.3 mg/dL 9.3   9.4   Total Protein 6.5 - 8.1 g/dL 6.9   7.5   Total Bilirubin 0.3 - 1.2 mg/dL 2.2   2.6   Alkaline Phos 38 - 126 U/L 63   61   AST 15 - 41 U/L 36   31   ALT 0 - 44 U/L 14   13      Imaging studies:   CLINICAL DATA:  Epigastric pain.   EXAM: CT ABDOMEN AND PELVIS WITH CONTRAST   TECHNIQUE: Multidetector CT imaging of the abdomen and pelvis was performed using the standard protocol following bolus administration of intravenous contrast.   RADIATION DOSE REDUCTION: This exam was performed according to the departmental dose-optimization program which includes automated exposure control,  adjustment of the mA and/or kV according to patient size and/or use of iterative reconstruction technique.   CONTRAST:  174m OMNIPAQUE IOHEXOL 300 MG/ML  SOLN   COMPARISON:  CT abdomen pelvis dated 12/29/2021.   FINDINGS: Lower chest: There is trace pleural fluid bilaterally with mild associated atelectasis.   Hepatobiliary: No focal liver abnormality is seen. Status post cholecystectomy. No biliary dilatation.   Pancreas: Atrophic. No pancreatic ductal dilatation or surrounding inflammatory changes.   Spleen: The spleen measures 13 cm in craniocaudal dimension, similar to prior exams. No focal abnormality.   Adrenals/Urinary Tract: Adrenal glands are unremarkable. Kidneys are normal, without renal calculi, focal lesion, or hydronephrosis. Bladder is unremarkable.   Stomach/Bowel: The patient is status post  gastric bypass. There is a small hiatal hernia, unchanged. There is colonic diverticulosis without evidence of diverticulitis. No pericecal inflammatory changes are noted to suggest acute appendicitis. There is a ventral abdominal wall hernia to the right of midline and superior to the umbilicus containing a loop of small bowel which results in a small bowel obstruction. The degree of small bowel distension appears similar to prior exam. There is a small amount of fluid within the hernia sac, concerning for strangulation.   Vascular/Lymphatic: Aortic atherosclerosis. No enlarged abdominal or pelvic lymph nodes.   Reproductive: Status post hysterectomy. No adnexal masses.   Other: No free intraperitoneal fluid. An abandoned spinal cord stimulator lead is noted.   Musculoskeletal: Degenerative changes are seen in the spine.   IMPRESSION: Ventral abdominal wall hernia containing a loop of small bowel resulting in small bowel obstruction. Fluid within the hernia sac is concerning for bowel strangulation.     Electronically Signed   By: TZerita BoersM.D.   On: 12/30/2021 14:55  Assessment/Plan:  71y.o. 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  - OR for robotic assisted laparoscopic incarcerated anterior abdominal wall hernia repair, possible bowel resection if necessary.  -Risk discussed regarding anesthesia, bleeding, infection, recurrence, development of bowel fistula or anastomotic leak.  I believe that she understands these risks or not all inclusive, has had her questions adequately answered and desires to proceed.  No guarantees were ever expressed or implied.  All of the above findings and recommendations were discussed with the patient, and the medical team, and all of patient's questions were answered to her expressed satisfaction.   -- DRonny BaconM.D., [facility]9/16/2023 3:40 PM

## 2021-12-30 NOTE — Progress Notes (Signed)
Called and spoke with dr Barbra Sarks r/t mental status.  Pt wakes easy to voice and can state name/birthday.  Knows she is [place] but otherwise disoriented to location/time/situation.  Hemodynamically stable entire time in pacu.  Per dr Barbra Sarks pt was slow to answer preop and at times had to repeat things to try and get pt to understand.  Ok to return to room per dr Barbra Sarks.

## 2021-12-30 NOTE — Progress Notes (Signed)
PROGRESS NOTE    Morgan Dixon  JQB:[id number] DOB: Feb 10, 1951 DOA: 12/28/2021 PCP: Alfonse Flavors, MD       Brief Narrative:   From admission h and p by dr. Cristie Hem is a 71 y.o. female with medical history significant for Chronic pain on buprenorphine presents to the ED with a 2-day history of generalized abdominal pain with nausea.  Denies diarrhea or dysuria, fever or chills.    Assessment & Plan:   Principal Problem:   Partial small bowel obstruction (HCC) Active Problems:   Ventral hernia without obstruction or gangrene   CVA (cerebral vascular accident) ([place])   Obesity   Benign essential HTN   Chronic, continuous use of opioids   Partial small bowel obstruction (HCC) N.p.o. 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 Labs reassuring   Benign essential HTN As needed IV hydralazine while n.p.o.   DVT prophylaxis: SCDs Code Status: full Family Communication: daughter [name] updated telephonically 9/16  Level of care: Med-Surg Status is: Inpatient Remains inpatient appropriate because: severity of illness   Consultants:  General surgery   Procedures: none  Antimicrobials:  none    Subjective: Ongoing significant periumbilical pain  Objective: Vitals:   12/29/21 1947 12/30/21 0525 12/30/21 0534 12/30/21 0916  BP: (!) 165/60 (!) 182/67 (!) 169/58 (!) 161/68  Pulse: (!) 52 68 60 75  Resp: '16 18  19  '$ Temp: 97.9 F (36.6 C) 98.2 F (36.8 C)  98.8 F (37.1 C)  TempSrc: Oral Oral  Oral  SpO2: 96% 96%  95%  Weight:      Height:        Intake/Output Summary (Last 24 hours) at 12/30/2021 1321 Last data filed  at 12/30/2021 0908 Gross per 24 hour  Intake 2702.05 ml  Output --  Net 2702.05 ml   Filed Weights   12/28/21 2135  Weight: 108.9 kg    Examination:  General exam: in pain Respiratory system: Clear to auscultation. Respiratory effort normal. Cardiovascular system: S1 & S2 heard, RRR. No JVD, murmurs, rubs, gallops or clicks. No pedal edema. Gastrointestinal system: obese, non distended, generalized tenderness Central nervous system: Alert and oriented. No focal neurological deficits. Extremities: Symmetric 5 x 5 power. Pitting edema LEs Skin: No rashes, lesions or ulcers Psychiatry: Judgement and insight appear normal. Mood & affect appropriate.     Data Reviewed: I have personally reviewed following labs and imaging studies  CBC: Recent Labs  Lab 12/28/21 2137 12/29/21 0516 12/30/21 0612  WBC 4.8 5.0 4.6  HGB 11.1* 9.8* 10.6*  HCT 35.5* 31.7* 33.2*  MCV 84.9 86.4 84.7  PLT 175 148* 409   Basic Metabolic Panel: Recent Labs  Lab 12/28/21 2137 12/29/21 0516 12/30/21 0612  NA 143  --  141  K 4.3  --  3.7  CL 103  --  104  CO2 28  --  25  GLUCOSE 104*  --  82  BUN 13  --  11  CREATININE 0.79 0.82 0.78  CALCIUM 9.4  --  9.3   GFR: Estimated Creatinine Clearance: 80.5 mL/min (by C-G formula based on SCr of 0.78 mg/dL). Liver Function Tests: Recent Labs  Lab 12/28/21 2137 12/30/21 7353  AST 31 36  ALT 13 14  ALKPHOS 61 63  BILITOT 2.6* 2.2*  PROT 7.5 6.9  ALBUMIN 3.7 3.5   Recent Labs  Lab 12/28/21 2137  LIPASE 31   No results for input(s): "AMMONIA" in the last 168 hours. Coagulation Profile: No results for input(s): "INR", "PROTIME" in the last 168 hours. Cardiac Enzymes: No results for input(s): "CKTOTAL", "CKMB", "CKMBINDEX", "TROPONINI" in the last 168 hours. BNP (last 3 results) No results for input(s): "PROBNP" in the last 8760 hours. HbA1C: No results for input(s): "HGBA1C" in the last 72 hours. CBG: No results for input(s): "GLUCAP"  in the last 168 hours. Lipid Profile: No results for input(s): "CHOL", "HDL", "LDLCALC", "TRIG", "CHOLHDL", "LDLDIRECT" in the last 72 hours. Thyroid Function Tests: No results for input(s): "TSH", "T4TOTAL", "FREET4", "T3FREE", "THYROIDAB" in the last 72 hours. Anemia Panel: No results for input(s): "VITAMINB12", "FOLATE", "FERRITIN", "TIBC", "IRON", "RETICCTPCT" in the last 72 hours. Urine analysis:    Component Value Date/Time   COLORURINE YELLOW (A) 12/28/2021 2140   APPEARANCEUR CLEAR (A) 12/28/2021 2140   LABSPEC >1.046 (H) 12/28/2021 2140   PHURINE 6.0 12/28/2021 2140   GLUCOSEU NEGATIVE 12/28/2021 2140   HGBUR SMALL (A) 12/28/2021 2140   BILIRUBINUR NEGATIVE 12/28/2021 2140   KETONESUR 5 (A) 12/28/2021 2140   PROTEINUR NEGATIVE 12/28/2021 2140   NITRITE NEGATIVE 12/28/2021 2140   LEUKOCYTESUR MODERATE (A) 12/28/2021 2140   Sepsis Labs: '@LABRCNTIP'$ (procalcitonin:4,lacticidven:4)  ) Recent Results (from the past 240 hour(s))  Urine Culture     Status: Abnormal   Collection Time: 12/28/21  9:40 PM   Specimen: Urine, Random  Result Value Ref Range Status   Specimen Description   Final    URINE, RANDOM Performed at [facility], [address] [place], [place] [address]    Special Requests   Final    NONE Performed at [facility], [address] [place], [place] [address]    Culture (A)  Final    <10,000 COLONIES/mL INSIGNIFICANT GROWTH Performed at [facility], [place] [address] [place], [place] [address]    Report Status 12/30/2021 FINAL  Final         Radiology Studies: CT ABDOMEN PELVIS W CONTRAST  Result Date: 12/29/2021 CLINICAL DATA:  Abdominal pain for 2 days. EXAM: CT ABDOMEN AND PELVIS WITH CONTRAST TECHNIQUE: Multidetector CT imaging of the abdomen and pelvis was performed using the standard protocol following bolus administration of intravenous contrast. RADIATION DOSE REDUCTION: This exam was performed according to the  departmental dose-optimization program which includes automated exposure control, adjustment of the mA and/or kV according to patient size and/or use of iterative reconstruction technique. CONTRAST:  144m OMNIPAQUE IOHEXOL 300 MG/ML  SOLN COMPARISON:  Aug 17, 2020 FINDINGS: Lower chest: No acute abnormality. Hepatobiliary: No focal liver abnormality is seen. Status post cholecystectomy. No biliary dilatation. Pancreas: Unremarkable. No pancreatic ductal dilatation or surrounding inflammatory changes. Spleen: Punctate calcified granulomas are seen within the mildly enlarged spleen. Adrenals/Urinary Tract: Adrenal glands are unremarkable. Kidneys are normal in size, without renal calculi or hydronephrosis. Small bilateral simple renal cysts are noted. Bladder is unremarkable. Stomach/Bowel: There is a small hiatal hernia. Multiple adjacent surgical clips and surgical sutures are seen. Surgical clips are also seen along the splenic hilum. The appendix is not clearly identified. Small bowel loops within the mid and lower abdomen approach the upper limit of normal caliber. A dilated small bowel loop (3.5 cm) is seen within the mid left abdomen (axial  CT image 54, CT series 2). A transition zone is seen within the mid left abdomen (axial CT images 54 through 57, CT series 2). Mildly thickened small bowel loops are also seen within the mid and lower abdomen, at and distal to the transition zone. Noninflamed diverticula are seen within the sigmoid colon. Vascular/Lymphatic: No significant vascular findings are present. No enlarged abdominal or pelvic lymph nodes. Reproductive: Status post hysterectomy. No adnexal masses. Other: A 6.1 cm x 2.8 cm ventral hernia is seen along the anterior aspect of the mid abdominal wall, to the right of midline. This contains fluid and a short segment of decompressed small bowel, and is increased in size when compared to the prior study. Musculoskeletal: Multilevel degenerative changes seen  throughout the lumbar spine. IMPRESSION: 1. Moderate severity enteritis involving multiple loops of mid and distal small bowel, with additional findings consistent with a subsequent early partial small bowel obstruction. 2. Evidence of prior gastric bypass surgery, prior cholecystectomy and prior hysterectomy. 3. Ventral hernia, as described above. 4. Sigmoid diverticulosis. 5. Small bilateral simple renal cysts. No additional follow-up or imaging is recommended. This recommendation follows ACR consensus guidelines: Management of the Incidental Renal Mass on CT: A White Paper of the ACR Incidental Findings Committee. J Am Coll Radiol [phone]. Electronically Signed   By: Virgina Norfolk M.D.   On: 12/29/2021 01:00        Scheduled Meds:  buprenorphine  8 mg Sublingual BID   Continuous Infusions:  lactated ringers 125 mL/hr at 12/30/21 1106     LOS: 1 day     Desma Maxim, MD [facility]   If 7PM-7AM, please contact night-coverage [email or web address] Password [email or web address] 12/30/2021, 1:21 PM

## 2021-12-30 NOTE — Anesthesia Preprocedure Evaluation (Signed)
Anesthesia Evaluation  Patient identified by MRN, date of birth, ID band Patient awake    Reviewed: Allergy & Precautions, NPO status , Patient's Chart, lab work & pertinent test results  History of Anesthesia Complications Negative for: history of anesthetic complications  Airway Mallampati: III  TM Distance: >3 FB Neck ROM: full    Dental  (+) Dental Advidsory Given, Lower Dentures, Upper Dentures   Pulmonary neg pulmonary ROS, neg COPD,    Pulmonary exam normal        Cardiovascular (-) Past MI and (-) CABG Normal cardiovascular exam     Neuro/Psych CVA, No Residual Symptoms negative psych ROS   GI/Hepatic negative GI ROS, Neg liver ROS,   Endo/Other  negative endocrine ROS  Renal/GU      Musculoskeletal   Abdominal   Peds  Hematology negative hematology ROS (+)   Anesthesia Other Findings Past Medical History: No date: Chronic pain No date: Edema extremities  Past Surgical History: 1989: ABDOMINAL HYSTERECTOMY No date: BACK SURGERY No date: GASTRIC BYPASS  BMI    Body Mass Index: 38.74 kg/m      Reproductive/Obstetrics negative OB ROS                             Anesthesia Physical Anesthesia Plan  ASA: 3 and emergent  Anesthesia Plan: General ETT   Post-op Pain Management:    Induction: Intravenous  PONV Risk Score and Plan: 4 or greater and Ondansetron, Dexamethasone and Midazolam  Airway Management Planned: Oral ETT  Additional Equipment:   Intra-op Plan:   Post-operative Plan: Extubation in OR  Informed Consent: I have reviewed the patients History and Physical, chart, labs and discussed the procedure including the risks, benefits and alternatives for the proposed anesthesia with the patient or authorized representative who has indicated his/her understanding and acceptance.     Dental Advisory Given  Plan Discussed with: Anesthesiologist, CRNA and  Surgeon  Anesthesia Plan Comments: (Patient consented for risks of anesthesia including but not limited to:  - adverse reactions to medications - damage to eyes, teeth, lips or other oral mucosa - nerve damage due to positioning  - sore throat or hoarseness - Damage to heart, brain, nerves, lungs, other parts of body or loss of life  Patient voiced understanding.)        Anesthesia Quick Evaluation

## 2021-12-30 NOTE — Progress Notes (Signed)
Pre-op report called and given to Juliann Pulse, Therapist, sports. All questions answered to satisfaction. OR personnel at bedside to transport pt via bed.

## 2021-12-31 DIAGNOSIS — K56600 Partial intestinal obstruction, unspecified as to cause: Secondary | ICD-10-CM | POA: Diagnosis not present

## 2021-12-31 LAB — BASIC METABOLIC PANEL
Anion gap: 11 (ref 5–15)
BUN: 13 mg/dL (ref 8–23)
CO2: 24 mmol/L (ref 22–32)
Calcium: 8.6 mg/dL — ABNORMAL LOW (ref 8.9–10.3)
Chloride: 105 mmol/L (ref 98–111)
Creatinine, Ser: 0.64 mg/dL (ref 0.44–1.00)
GFR, Estimated: >60 mL/min (ref >60)
Glucose, Bld: 121 mg/dL — ABNORMAL HIGH (ref 70–99)
Potassium: 3.9 mmol/L (ref 3.5–5.1)
Sodium: 140 mmol/L (ref 135–145)

## 2021-12-31 LAB — CBC
HCT: 32.6 % — ABNORMAL LOW (ref 36.0–46.0)
Hemoglobin: 10 g/dL — ABNORMAL LOW (ref 12.0–15.0)
MCH: 26.7 pg (ref 26.0–34.0)
MCHC: 30.7 g/dL (ref 30.0–36.0)
MCV: 86.9 fL (ref 80.0–100.0)
Platelets: 154 10*3/uL (ref 150–400)
RBC: 3.75 MIL/uL — ABNORMAL LOW (ref 3.87–5.11)
RDW: 16 % — ABNORMAL HIGH (ref 11.5–15.5)
WBC: 5.6 10*3/uL (ref 4.0–10.5)
nRBC: 0 % (ref 0.0–0.2)

## 2021-12-31 MED ORDER — SENNA 8.6 MG PO TABS
1.0000 | ORAL_TABLET | Freq: Every day | ORAL | Status: DC
  Administered 2021-12-31 – 2022-01-04 (×3): 8.6 mg via ORAL
  Filled 2021-12-31 (×5): qty 1

## 2021-12-31 MED ORDER — POLYETHYLENE GLYCOL 3350 17 G PO PACK
17.0000 g | PACK | Freq: Two times a day (BID) | ORAL | Status: DC
  Administered 2021-12-31 – 2022-01-04 (×7): 17 g via ORAL
  Filled 2021-12-31 (×10): qty 1

## 2021-12-31 MED ORDER — HYDROMORPHONE HCL 1 MG/ML IJ SOLN
2.0000 mg | INTRAMUSCULAR | Status: DC | PRN
  Administered 2021-12-31 – 2022-01-01 (×2): 2 mg via INTRAVENOUS
  Filled 2021-12-31 (×2): qty 2

## 2021-12-31 NOTE — Progress Notes (Signed)
PROGRESS NOTE    Morgan Dixon  LZJ:[id number] DOB: November 17, 1950 DOA: 12/28/2021 PCP: Alfonse Flavors, MD       Brief Narrative:   From admission h and p by dr. Cristie Hem is a 72 y.o. female with medical history significant for Chronic pain on buprenorphine presents to the ED with a 2-day history of generalized abdominal pain with nausea.  Denies diarrhea or dysuria, fever or chills.    Assessment & Plan:   Principal Problem:   Partial small bowel obstruction (HCC) Active Problems:   Ventral hernia with obstruction and without gangrene   CVA (cerebral vascular accident) ([place])   Obesity   Benign essential HTN   Chronic, continuous use of opioids   Periumbilical abdominal pain   Partial small bowel obstruction ([place]) 2/2 ventral hernia as below, now s/p operative repair  Incarcerated incisional hernia  Reduced in ED but on 9/16 CT showed recurrence with incarceration. 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 No answer when called today  Level of care: Med-Surg Status is: Inpatient Remains inpatient appropriate because: severity of illness   Consultants:  General surgery   Procedures: none  Antimicrobials:  none    Subjective: Ongoing abdominal pain  Objective: Vitals:   12/31/21 0100 12/31/21 0441 12/31/21 0809 12/31/21 0824  BP: (!) 152/74 (!) 148/56 134/64   Pulse: 64 75 82   Resp: '18 18  17  '$ Temp: (!) 97.4 F (36.3 C) 98.9 F (37.2 C) 98.2 F (36.8 C)   TempSrc: Tympanic Oral    SpO2:  97% 100%   Weight:       Height:        Intake/Output Summary (Last 24 hours) at 12/31/2021 1228 Last data filed at 12/31/2021 0447 Gross per 24 hour  Intake 1375.82 ml  Output 1155 ml  Net 220.82 ml   Filed Weights   12/28/21 2135  Weight: 108.9 kg    Examination:  General exam: in pain Respiratory system: Clear to auscultation. Respiratory effort normal. Cardiovascular system: S1 & S2 heard, RRR. No JVD, murmurs, rubs, gallops or clicks. No pedal edema. Gastrointestinal system: obese, non distended, generalized tenderness Central nervous system: Alert and oriented. No focal neurological deficits. Extremities: Symmetric 5 x 5 power. Pitting edema LEs Skin: No rashes, lesions or ulcers Psychiatry: Judgement and insight appear normal. Mood & affect appropriate.     Data Reviewed: I have personally reviewed following labs and imaging studies  CBC: Recent Labs  Lab 12/28/21 2137 12/29/21 0516 12/30/21 0612 12/31/21 0629  WBC 4.8 5.0 4.6 5.6  HGB 11.1* 9.8* 10.6* 10.0*  HCT 35.5* 31.7* 33.2* 32.6*  MCV 84.9 86.4 84.7 86.9  PLT 175 148* 150 024   Basic Metabolic Panel: Recent Labs  Lab 12/28/21 2137 12/29/21 0516 12/30/21 0612 12/31/21 0629  NA 143  --  141 140  K 4.3  --  3.7 3.9  CL 103  --  104 105  CO2 28  --  25 24  GLUCOSE 104*  --  82 121*  BUN 13  --  11 13  CREATININE 0.79  0.82 0.78 0.64  CALCIUM 9.4  --  9.3 8.6*   GFR: Estimated Creatinine Clearance: 80.5 mL/min (by C-G formula based on SCr of 0.64 mg/dL). Liver Function Tests: Recent Labs  Lab 12/28/21 2137 12/30/21 0612  AST 31 36  ALT 13 14  ALKPHOS 61 63  BILITOT 2.6* 2.2*  PROT 7.5 6.9  ALBUMIN 3.7 3.5   Recent Labs  Lab 12/28/21 2137  LIPASE 31   No results for input(s): "AMMONIA" in the last 168 hours. Coagulation Profile: No results for input(s): "INR", "PROTIME" in the last 168 hours. Cardiac Enzymes: No results for input(s): "CKTOTAL", "CKMB", "CKMBINDEX", "TROPONINI" in the last 168  hours. BNP (last 3 results) No results for input(s): "PROBNP" in the last 8760 hours. HbA1C: No results for input(s): "HGBA1C" in the last 72 hours. CBG: No results for input(s): "GLUCAP" in the last 168 hours. Lipid Profile: No results for input(s): "CHOL", "HDL", "LDLCALC", "TRIG", "CHOLHDL", "LDLDIRECT" in the last 72 hours. Thyroid Function Tests: No results for input(s): "TSH", "T4TOTAL", "FREET4", "T3FREE", "THYROIDAB" in the last 72 hours. Anemia Panel: No results for input(s): "VITAMINB12", "FOLATE", "FERRITIN", "TIBC", "IRON", "RETICCTPCT" in the last 72 hours. Urine analysis:    Component Value Date/Time   COLORURINE YELLOW (A) 12/28/2021 2140   APPEARANCEUR CLEAR (A) 12/28/2021 2140   LABSPEC >1.046 (H) 12/28/2021 2140   PHURINE 6.0 12/28/2021 2140   GLUCOSEU NEGATIVE 12/28/2021 2140   HGBUR SMALL (A) 12/28/2021 2140   BILIRUBINUR NEGATIVE 12/28/2021 2140   KETONESUR 5 (A) 12/28/2021 2140   PROTEINUR NEGATIVE 12/28/2021 2140   NITRITE NEGATIVE 12/28/2021 2140   LEUKOCYTESUR MODERATE (A) 12/28/2021 2140   Sepsis Labs: '@LABRCNTIP'$ (procalcitonin:4,lacticidven:4)  ) Recent Results (from the past 240 hour(s))  Urine Culture     Status: Abnormal   Collection Time: 12/28/21  9:40 PM   Specimen: Urine, Random  Result Value Ref Range Status   Specimen Description   Final    URINE, RANDOM Performed at [facility], [address], [place], [place] [address]    Special Requests   Final    NONE Performed at [facility], [address] [place], [place] [address]    Culture (A)  Final    <10,000 COLONIES/mL INSIGNIFICANT GROWTH Performed at [facility], [place] [address] [place], [place]  [address]    Report Status 12/30/2021 FINAL  Final  Surgical pcr screen     Status: None   Collection Time: 12/30/21  3:50 PM   Specimen: Nasal Mucosa; Nasal Swab  Result Value Ref Range Status   MRSA, PCR NEGATIVE NEGATIVE Final   Staphylococcus aureus  NEGATIVE NEGATIVE Final    Comment: (NOTE) The Xpert SA Assay (FDA approved for NASAL specimens in patients 60 years of age and older), is one component of a comprehensive surveillance program. It is not intended to diagnose infection nor to guide or monitor treatment. Performed at [facility], [address], [place], [place] [address]          Radiology Studies: CT ABDOMEN PELVIS W CONTRAST  Result Date: 12/30/2021 CLINICAL DATA:  Epigastric pain. EXAM: CT ABDOMEN AND PELVIS WITH CONTRAST TECHNIQUE: Multidetector CT imaging of the abdomen and pelvis was performed using the standard protocol following bolus administration of intravenous contrast. RADIATION DOSE REDUCTION: This exam was performed according to the departmental dose-optimization program which includes automated exposure control, adjustment of the mA and/or kV according to patient size and/or use of iterative reconstruction technique. CONTRAST:  169m OMNIPAQUE  IOHEXOL 300 MG/ML  SOLN COMPARISON:  CT abdomen pelvis dated 12/29/2021. FINDINGS: Lower chest: There is trace pleural fluid bilaterally with mild associated atelectasis. Hepatobiliary: No focal liver abnormality is seen. Status post cholecystectomy. No biliary dilatation. Pancreas: Atrophic. No pancreatic ductal dilatation or surrounding inflammatory changes. Spleen: The spleen measures 13 cm in craniocaudal dimension, similar to prior exams. No focal abnormality. Adrenals/Urinary Tract: Adrenal glands are unremarkable. Kidneys are normal, without renal calculi, focal lesion, or hydronephrosis. Bladder is unremarkable. Stomach/Bowel: The patient is status post gastric bypass. There is a small hiatal hernia, unchanged. There is colonic diverticulosis without evidence of diverticulitis. No pericecal inflammatory changes are noted to suggest acute appendicitis. There is a ventral abdominal wall hernia to the right of midline and superior to the umbilicus containing a  loop of small bowel which results in a small bowel obstruction. The degree of small bowel distension appears similar to prior exam. There is a small amount of fluid within the hernia sac, concerning for strangulation. Vascular/Lymphatic: Aortic atherosclerosis. No enlarged abdominal or pelvic lymph nodes. Reproductive: Status post hysterectomy. No adnexal masses. Other: No free intraperitoneal fluid. An abandoned spinal cord stimulator lead is noted. Musculoskeletal: Degenerative changes are seen in the spine. IMPRESSION: Ventral abdominal wall hernia containing a loop of small bowel resulting in small bowel obstruction. Fluid within the hernia sac is concerning for bowel strangulation. Electronically Signed   By: Zerita Boers M.D.   On: 12/30/2021 14:55        Scheduled Meds:   Continuous Infusions:  lactated ringers 100 mL/hr at 12/30/21 1640   metronidazole       LOS: 2 days     Desma Maxim, MD [facility]   If 7PM-7AM, please contact night-coverage [email or web address] Password [email or web address] 12/31/2021, 12:28 PM

## 2021-12-31 NOTE — Progress Notes (Signed)
PT Cancellation Note  Patient Details Name: Morgan Dixon MRN: [id number] DOB: [birth year]   Cancelled Treatment:    Reason Eval/Treat Not Completed: Patient declined, no reason specified;Fatigue/lethargy limiting ability to participate. Pt reporting significant pain this AM. Also stated that her RN just recently gave her IV pain meds. Pt also very lethargic and unable to maintain her eyes open for more than a few seconds at a time. PT will continue to f/u with pt acutely to attempt evaluation as available and appropriate.    Morgan Dixon 12/31/2021, 8:41 AM

## 2021-12-31 NOTE — Plan of Care (Signed)

## 2021-12-31 NOTE — Progress Notes (Signed)
[facility] Day(s): 2.   Post op day(s): 1 Day Post-Op.   Interval History: Patient seen and examined, not wearing binder, has not been out of bed yet.  Pain primarily around hernia site.  Denies incisional pain no acute events or new complaints overnight. Patient reports no appetite, no bowel function.  Review of Systems:  Constitutional: denies fever, chills  Respiratory: denies any shortness of breath  Cardiovascular: denies chest pain or palpitations  Gastrointestinal: Denies N/V, or diarrhea/and bowel function as per interval history Musculoskeletal: denies pain, decreased motor or sensation Integumentary: denies any other rashes or skin discolorations  Vital signs in last 24 hours: [min-max] current  Temp:  [97.3 F (36.3 C)-98.9 F (37.2 C)] 98.2 F (36.8 C) (09/17 0809) Pulse Rate:  [56-82] 82 (09/17 0809) Resp:  [11-18] 17 (09/17 0824) BP: (134-162)/(56-74) 134/64 (09/17 0809) SpO2:  [96 %-100 %] 100 % (09/17 0809)     Height: '5\' 6"'$  (167.6 cm) Weight: 108.9 kg BMI (Calculated): 38.76   Intake/Output last 2 shifts:  09/16 0701 - 09/17 0700 In: 2623.2 [I.V.:2623.2] Out: 8119 [Urine:1125; Blood:30]   Physical Exam:  Constitutional: alert, cooperative and no distress  Respiratory: breathing non-labored at rest  Cardiovascular: regular rate and sinus rhythm  Gastrointestinal: soft, non-tender, and non-distended.  Incisions clean dry and intact. Integumentary: No rashes erythema or ecchymosis.  Labs:     Latest Ref Rng & Units 12/31/2021    6:29 AM 12/30/2021    6:12 AM 12/29/2021    5:16 AM  CBC  WBC 4.0 - 10.5 K/uL 5.6  4.6  5.0   Hemoglobin 12.0 - 15.0 g/dL 10.0  10.6  9.8   Hematocrit 36.0 - 46.0 % 32.6  33.2  31.7   Platelets 150 - 400 K/uL 154  150  148       Latest Ref Rng & Units 12/31/2021    6:29 AM 12/30/2021    6:12 AM 12/29/2021    5:16 AM  CMP  Glucose 70 - 99 mg/dL 121  82    BUN 8 - 23 mg/dL 13   11    Creatinine 0.44 - 1.00 mg/dL 0.64  0.78  0.82   Sodium 135 - 145 mmol/L 140  141    Potassium 3.5 - 5.1 mmol/L 3.9  3.7    Chloride 98 - 111 mmol/L 105  104    CO2 22 - 32 mmol/L 24  25    Calcium 8.9 - 10.3 mg/dL 8.6  9.3    Total Protein 6.5 - 8.1 g/dL  6.9    Total Bilirubin 0.3 - 1.2 mg/dL  2.2    Alkaline Phos 38 - 126 U/L  63    AST 15 - 41 U/L  36    ALT 0 - 44 U/L  14       Imaging studies: No new pertinent imaging studies   Assessment/Plan:  71 y.o. female with  1 Day Post-Op s/p robotic incarcerated anterior domino wall hernia repair with mesh for incarcerated incisional hernia, complicated by pertinent comorbidities including:  Patient Active Problem List   Diagnosis Date Noted   Periumbilical abdominal pain    Partial small bowel obstruction ([place]) 12/29/2021   Ventral hernia with obstruction and without gangrene 12/29/2021   Obesity 08/27/2020   Benign essential HTN 08/27/2020   CVA (cerebral vascular accident) ([place]) 14/78/2956   Acute metabolic encephalopathy 21/30/8657   Acute lower UTI 08/17/2020   Hypokalemia 08/17/2020  Left renal mass 08/17/2020   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  -We will hold on initiating diet until improved return of bowel function.  Or at least return of appetite.  -We will defer DVT prophylaxis of Lovenox, continuing SCDs for now.  -PPI prophylaxis.  Antibiotic prophylaxis completed.  All of the above findings and recommendations were discussed with the patient, and all of patient's questions were answered to their expressed satisfaction.  -- Ronny Bacon, M.D., [facility] 12/31/2021

## 2021-12-31 NOTE — Progress Notes (Signed)
OT Cancellation Note  Patient Details Name: MCKAILA DUFFUS MRN: [id number] DOB: Jul 18, 1950   Cancelled Treatment:    Reason Eval/Treat Not Completed: Fatigue/lethargy limiting ability to participate;Pain limiting ability to participate. Attempting to see pt for evaluation. Pt reporting fatigue and significant pain, notified RN that pt requesting pain meds. Will re-attempt as able.  Doneta Public 12/31/2021, 12:02 PM

## 2021-12-31 NOTE — Progress Notes (Addendum)
Pt has declined to have foley removed today. She is requesting for the foley to remain in through tonight and to be removed tomorrow morning. RN educated pt on benefits of foley removal. Laurey Arrow, MD aware. Foley to be removed tomorrow morning.

## 2021-12-31 NOTE — Evaluation (Signed)
Occupational Therapy Evaluation Patient Details Name: Morgan Dixon MRN: [id number] DOB: 28-Jul-1950 Today's Date: 12/31/2021   History of Present Illness Morgan Dixon is a 71 y.o. female with medical history significant for Chronic pain on buprenorphine presents to the ED with a 2-day history of generalized abdominal pain with nausea.   Clinical Impression   Patient seen for OT evaluation. RN present in room and assisted with mobility. Patient presenting with abdominal pain, decreased strength, endurance, and balance impacting safety and independence in ADLs. Patient unable to provide history/PLOF this date, could use clarification. Per chart review, pt resides in ALF. Patient currently functioning at Mod-Max A for supine to sit, Max A +2 for sit to supine, Max A for log rolling, and Max A for LB dressing. Patient performance was limited this date 2/2 significant abdominal pain, RN present to give pain meds. Patient will benefit from acute OT to increase overall independence in the areas of ADLs and functional mobility in order to safely discharge to next venue of care. Upon hospital discharge, recommend STR to maximize pt safety and return to PLOF.    Recommendations for follow up therapy are one component of a multi-disciplinary discharge planning process, led by the attending physician.  Recommendations may be updated based on patient status, additional functional criteria and insurance authorization.   Follow Up Recommendations  Skilled nursing-short term rehab (<3 hours/day)    Assistance Recommended at Discharge Frequent or constant Supervision/Assistance  Patient can return home with the following A lot of help with walking and/or transfers;A lot of help with bathing/dressing/bathroom;Assistance with cooking/housework;Assist for transportation;Help with stairs or ramp for entrance    Functional Status Assessment  Patient has had a recent decline in their functional status and  demonstrates the ability to make significant improvements in function in a reasonable and predictable amount of time.  Equipment Recommendations  Other (comment) (defer to next venue of care)    Recommendations for Other Services       Precautions / Restrictions Precautions Precautions: Fall Precaution Comments: abdominal binder Restrictions Weight Bearing Restrictions: No      Mobility Bed Mobility Overal bed mobility: Needs Assistance Bed Mobility: Supine to Sit, Sit to Supine, Rolling Rolling: Max assist   Supine to sit: Mod assist, HOB elevated Sit to supine: Max assist, +2 for physical assistance (for trunk control and to manage LEs)   General bed mobility comments: increased time and verbal cues    Transfers                          Balance Overall balance assessment: Needs assistance Sitting-balance support: Feet supported, Single extremity supported Sitting balance-Leahy Scale: Fair Sitting balance - Comments: required verbal cues to maintain upright posture, Min guard sitting EOB Postural control: Left lateral lean                                 ADL either performed or assessed with clinical judgement   ADL Overall ADL's : Needs assistance/impaired                     Lower Body Dressing: Maximal assistance;Sitting/lateral leans                 General ADL Comments: Pt deferring further ADL tasks 2/2 pain. RN reported that pt was completing functional mobility to the bathroom INDly using RW before the  surgery.     Vision Baseline Vision/History: 1 Wears glasses Patient Visual Report: No change from baseline       Perception     Praxis      Pertinent Vitals/Pain Pain Assessment Pain Assessment: 0-10 Pain Score: 10-Worst pain ever Pain Location: abdomen Pain Descriptors / Indicators: Constant, Grimacing, Crying, Jabbing, Operative site guarding Pain Intervention(s): RN gave pain meds during session, Limited  activity within patient's tolerance, Monitored during session, Repositioned     Hand Dominance Right   Extremity/Trunk Assessment Upper Extremity Assessment Upper Extremity Assessment: Generalized weakness   Lower Extremity Assessment Lower Extremity Assessment: Generalized weakness       Communication Communication Communication: No difficulties   Cognition Arousal/Alertness: Lethargic, Suspect due to medications (pain meds) Behavior During Therapy: Anxious Overall Cognitive Status: No family/caregiver present to determine baseline cognitive functioning                                 General Comments: Pt sleeping in bed upon arrival, woke easily to voice. Required max encouragement from RN and OT to participate 2/2 pain. Attempting to ask history/PLOF questions, however, pt unable to report where she was living PTA. Increased time given for response but kept stating "let me think" and "I'm not sure". Oriented to self, disoriented to location/time/situation. Increased time for processing.     General Comments  BLE edema    Exercises Other Exercises Other Exercises: OT provided education re: role of OT, OT POC, post acute recs, sitting up for all meals, EOB/OOB mobility with assistance, home/fall safety.     Shoulder Instructions      Home Living Family/patient expects to be discharged to:: Assisted living                                 Additional Comments: Pt unable to state where she lives or provide PLOF at this time, could use clarification. Per chart review, pt resides in [facility].      Prior Functioning/Environment Prior Level of Function : Patient poor historian/Family not available               ADLs Comments: Pt unable to state where she lives or provide PLOF at this time, could use clarification.        OT Problem List: Decreased strength;Increased edema;Decreased activity tolerance;Decreased cognition;Impaired  balance (sitting and/or standing);Pain;Obesity      OT Treatment/Interventions: Self-care/ADL training;Therapeutic exercise;Patient/family education;Balance training;Energy conservation;Therapeutic activities;DME and/or AE instruction;Cognitive remediation/compensation    OT Goals(Current goals can be found in the care plan section) Acute Rehab OT Goals Patient Stated Goal: reduce pain OT Goal Formulation: Patient unable to participate in goal setting Time For Goal Achievement: 01/14/22 Potential to Achieve Goals: Good ADL Goals Pt Will Perform Grooming: standing;with supervision Pt Will Perform Upper Body Dressing: sitting;with supervision Pt Will Perform Lower Body Dressing: with adaptive equipment;sitting/lateral leans;sit to/from stand;with supervision Pt Will Transfer to Toilet: ambulating;bedside commode;with supervision Pt Will Perform Toileting - Clothing Manipulation and hygiene: with supervision;sit to/from stand;with adaptive equipment  OT Frequency: Min 2X/week    Co-evaluation              AM-PAC OT "6 Clicks" Daily Activity     Outcome Measure Help from another person eating meals?: A Little Help from another person taking care of personal grooming?: A Lot Help from another person  toileting, which includes using toliet, bedpan, or urinal?: A Lot Help from another person bathing (including washing, rinsing, drying)?: A Lot Help from another person to put on and taking off regular upper body clothing?: A Lot Help from another person to put on and taking off regular lower body clothing?: A Lot 6 Click Score: 13   End of Session Equipment Utilized During Treatment: Oxygen Nurse Communication: Mobility status;Patient requests pain meds  Activity Tolerance: Patient limited by pain Patient left: in bed;with SCD's reapplied;with call bell/phone within reach;with bed alarm set;with nursing/sitter in room  OT Visit Diagnosis: Other abnormalities of gait and mobility  (R26.89);Muscle weakness (generalized) (M62.81);Pain Pain - part of body:  (abdomen)                Time: 0301-3143 OT Time Calculation (min): 27 min Charges:  OT General Charges $OT Visit: 1 Visit OT Evaluation $OT Eval Low Complexity: 1 Low  [facility] MS, OTR/L ascom [phone]  12/31/21, 4:34 PM

## 2021-12-31 NOTE — Progress Notes (Signed)
Abdominal binder placed as ordered.

## 2021-12-31 NOTE — Progress Notes (Signed)
PT Cancellation Note  Patient Details Name: Morgan Dixon MRN: [id number] DOB: 02-Dec-1950   Cancelled Treatment:    Reason Eval/Treat Not Completed: Patient declined, no reason specified;Fatigue/lethargy limiting ability to participate. Attempted eval for the second time today. Pt adamantly refusing to participate despite [name] explanation of purpose of visit and reasons PT would be beneficial. Pt requesting pain meds and RN was notified.    Clearnce Sorrel Sujay Grundman 12/31/2021, 12:40 PM

## 2022-01-01 ENCOUNTER — Encounter: Payer: Self-pay | Admitting: Surgery

## 2022-01-01 DIAGNOSIS — K56600 Partial intestinal obstruction, unspecified as to cause: Secondary | ICD-10-CM | POA: Diagnosis not present

## 2022-01-01 LAB — BASIC METABOLIC PANEL
Anion gap: 7 (ref 5–15)
BUN: 10 mg/dL (ref 8–23)
CO2: 28 mmol/L (ref 22–32)
Calcium: 8.4 mg/dL — ABNORMAL LOW (ref 8.9–10.3)
Chloride: 102 mmol/L (ref 98–111)
Creatinine, Ser: 0.67 mg/dL (ref 0.44–1.00)
GFR, Estimated: >60 mL/min (ref >60)
Glucose, Bld: 108 mg/dL — ABNORMAL HIGH (ref 70–99)
Potassium: 3.6 mmol/L (ref 3.5–5.1)
Sodium: 137 mmol/L (ref 135–145)

## 2022-01-01 LAB — CBC
HCT: 35.3 % — ABNORMAL LOW (ref 36.0–46.0)
Hemoglobin: 11 g/dL — ABNORMAL LOW (ref 12.0–15.0)
MCH: 27.4 pg (ref 26.0–34.0)
MCHC: 31.2 g/dL (ref 30.0–36.0)
MCV: 87.8 fL (ref 80.0–100.0)
Platelets: 169 10*3/uL (ref 150–400)
RBC: 4.02 MIL/uL (ref 3.87–5.11)
RDW: 16.3 % — ABNORMAL HIGH (ref 11.5–15.5)
WBC: 9.5 10*3/uL (ref 4.0–10.5)
nRBC: 0 % (ref 0.0–0.2)

## 2022-01-01 LAB — LACTIC ACID, PLASMA: Lactic Acid, Venous: 1.1 mmol/L (ref 0.5–1.9)

## 2022-01-01 MED ORDER — LOSARTAN POTASSIUM 25 MG PO TABS
25.0000 mg | ORAL_TABLET | Freq: Every day | ORAL | Status: DC
  Administered 2022-01-01 – 2022-01-04 (×4): 25 mg via ORAL
  Filled 2022-01-01 (×4): qty 1

## 2022-01-01 MED ORDER — KETOROLAC TROMETHAMINE 15 MG/ML IJ SOLN
15.0000 mg | Freq: Four times a day (QID) | INTRAMUSCULAR | Status: AC | PRN
  Administered 2022-01-01 – 2022-01-05 (×8): 15 mg via INTRAVENOUS
  Filled 2022-01-01 (×8): qty 1

## 2022-01-01 MED ORDER — CYCLOBENZAPRINE HCL 10 MG PO TABS
5.0000 mg | ORAL_TABLET | Freq: Three times a day (TID) | ORAL | Status: DC | PRN
  Administered 2022-01-01 – 2022-01-04 (×5): 5 mg via ORAL
  Filled 2022-01-01 (×5): qty 1

## 2022-01-01 MED ORDER — ATORVASTATIN CALCIUM 20 MG PO TABS
40.0000 mg | ORAL_TABLET | Freq: Every day | ORAL | Status: DC
  Administered 2022-01-01 – 2022-01-06 (×6): 40 mg via ORAL
  Filled 2022-01-01 (×6): qty 2

## 2022-01-01 MED ORDER — SERTRALINE HCL 50 MG PO TABS
100.0000 mg | ORAL_TABLET | Freq: Every day | ORAL | Status: DC
  Administered 2022-01-01 – 2022-01-05 (×5): 100 mg via ORAL
  Filled 2022-01-01 (×5): qty 2

## 2022-01-01 MED ORDER — PREGABALIN 75 MG PO CAPS
200.0000 mg | ORAL_CAPSULE | Freq: Every day | ORAL | Status: DC
  Administered 2022-01-01 – 2022-01-06 (×6): 200 mg via ORAL
  Filled 2022-01-01 (×7): qty 1

## 2022-01-01 NOTE — Progress Notes (Signed)
[facility] Day(s): 3.   Post op day(s): 2 Days Post-Op.   Interval History:  Patient seen and examined No acute events or new complaints overnight.  Patient reports she is still having generalized abdominal discomfort Nauseous She remains without leukocytosis; 9.5K Hgb stable to 11.0 Renal function normal; sCr - 0.67; UO - 1250 ccs No electrolyte derangements NPO; no appetite   Vital signs in last 24 hours: [min-max] current  Temp:  [98.2 F (36.8 C)-98.9 F (37.2 C)] 98.8 F (37.1 C) (09/18 0518) Pulse Rate:  [76-84] 84 (09/18 0518) Resp:  [17-19] 18 (09/18 0518) BP: (134-169)/(55-69) 169/69 (09/18 0518) SpO2:  [92 %-100 %] 100 % (09/18 0518)     Height: '5\' 6"'$  (167.6 cm) Weight: 108.9 kg BMI (Calculated): 38.76   Intake/Output last 2 shifts:  09/17 0701 - 09/18 0700 In: 818.8 [I.V.:818.8] Out: 1250 [Urine:1250]   Physical Exam:  Constitutional: alert, cooperative, appears uncomfortable Respiratory: breathing non-labored at rest  Cardiovascular: regular rate and sinus rhythm  Gastrointestinal: soft, incisional and somewhat diffuse soreness, non-distended, no rebound/guarding. Abdominal binder in place Integumentary: Laparoscopic incisions are CDI with dermabond, no erythema or drainage   Labs:     Latest Ref Rng & Units 01/01/2022    5:22 AM 12/31/2021    6:29 AM 12/30/2021    6:12 AM  CBC  WBC 4.0 - 10.5 K/uL 9.5  5.6  4.6   Hemoglobin 12.0 - 15.0 g/dL 11.0  10.0  10.6   Hematocrit 36.0 - 46.0 % 35.3  32.6  33.2   Platelets 150 - 400 K/uL 169  154  150       Latest Ref Rng & Units 01/01/2022    5:22 AM 12/31/2021    6:29 AM 12/30/2021    6:12 AM  CMP  Glucose 70 - 99 mg/dL 108  121  82   BUN 8 - 23 mg/dL '10  13  11   '$ Creatinine 0.44 - 1.00 mg/dL 0.67  0.64  0.78   Sodium 135 - 145 mmol/L 137  140  141   Potassium 3.5 - 5.1 mmol/L 3.6  3.9  3.7   Chloride 98 - 111 mmol/L 102  105  104   CO2 22 - 32 mmol/L '28   24  25   '$ Calcium 8.9 - 10.3 mg/dL 8.4  8.6  9.3   Total Protein 6.5 - 8.1 g/dL   6.9   Total Bilirubin 0.3 - 1.2 mg/dL   2.2   Alkaline Phos 38 - 126 U/L   63   AST 15 - 41 U/L   36   ALT 0 - 44 U/L   14      Imaging studies: No new pertinent imaging studies   Assessment/Plan:  71 y.o. female 2 Days Post-Op s/p robotic assisted laparoscopic repair of incarcerated abdominal hernia (3-10 cm).    - Unfortunately, still with significant abdominal pain, nausea, and reported emesis. Will hold on initiation of diet just yet until certain some clinical improvement  - Continue IVF support    - Monitor abdominal examination; on-going bowel function   - Pain control prn; antiemetics prn - Out of bed; will engage PT; continue abdominal binder    All of the above findings and recommendations were discussed with the patient, and the medical team, and all of patient's questions were answered to her expressed satisfaction.  -- Edison Simon, PA-C [facility] 01/01/2022, 7:47 AM M-F: [facility] -  4pm

## 2022-01-01 NOTE — Progress Notes (Signed)
PROGRESS NOTE    Morgan Dixon  CXK:[id number] DOB: 02/15/1951 DOA: 12/28/2021 PCP: Alfonse Flavors, MD      Brief Narrative:   From admission h and p by dr. Cristie Dixon is a 71 y.o. female with medical history significant for Chronic pain on buprenorphine presents to the ED with a 2-day history of generalized abdominal pain with nausea.  Denies diarrhea or dysuria, fever or chills.   Assessment & Plan:   Principal Problem:   Partial small bowel obstruction (HCC) Active Problems:   Ventral hernia with obstruction and without gangrene   CVA (cerebral vascular accident) ([place])   Obesity   Benign essential HTN   Chronic, continuous use of opioids   Periumbilical abdominal pain   Partial small bowel obstruction ([place]) 2/2 ventral hernia as below, now s/p operative repair  Incarcerated incisional hernia  Reduced in ED but on 9/16 CT showed recurrence with incarceration. Taken to OR on 9/16 where robotic-assisted laparoscopic incisional hernia repair was performed. 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 Hold suboxone for now while receiving IV opioids   Benign essential HTN Here bp elevated - resume home losartan - prn hydral  Hx CVA - resume plavix when ok by gen surg, will    DVT prophylaxis: SCDs Code Status: full Family Communication: daughter [name] updated telephonically 9/18  Level of care: Med-Surg Status is: Inpatient Remains inpatient appropriate because: severity of illness   Consultants:  General surgery   Procedures: none  Antimicrobials:  none    Subjective: Ongoing abdominal pain unchanged, some nausea, no vomiting or  flatus/stool  Objective: Vitals:   12/31/21 1802 12/31/21 1952 01/01/22 0518 01/01/22 0800  BP: (!) 162/62 (!) 166/55 (!) 169/69 (!) 168/76  Pulse: 76 78 84 82  Resp: '19 18 18 20  '$ Temp: 98.6 F (37 C) 98.9 F (37.2 C) 98.8 F (37.1 C) 98.1 F (36.7 C)  TempSrc: Oral Oral Oral Oral  SpO2: 92% 97% 100% 92%  Weight:      Height:        Intake/Output Summary (Last 24 hours) at 01/01/2022 1103 Last data filed at 01/01/2022 0936 Gross per 24 hour  Intake 1789.38 ml  Output 1250 ml  Net 539.38 ml   Filed Weights   12/28/21 2135  Weight: 108.9 kg    Examination:  General exam: in pain Respiratory system: Clear to auscultation. Respiratory effort normal. Cardiovascular system: S1 & S2 heard, RRR. No JVD, murmurs, rubs, gallops or clicks. No pedal edema. Gastrointestinal system: obese, non distended, generalized tenderness Central nervous system: Alert and oriented. No focal neurological deficits. Extremities: Symmetric 5 x 5 power. Pitting edema LEs Skin: No rashes, lesions or ulcers Psychiatry: Judgement and insight appear normal. Mood & affect appropriate.     Data Reviewed: I have personally reviewed following labs and imaging studies  CBC: Recent Labs  Lab 12/28/21 2137 12/29/21 0516 12/30/21 0612 12/31/21 0629 01/01/22 0522  WBC 4.8 5.0 4.6 5.6 9.5  HGB 11.1* 9.8* 10.6* 10.0* 11.0*  HCT 35.5* 31.7* 33.2* 32.6* 35.3*  MCV 84.9 86.4 84.7 86.9 87.8  PLT 175 148* 150 154 970   Basic Metabolic Panel: Recent Labs  Lab 12/28/21  2137 12/29/21 0516 12/30/21 0612 12/31/21 0629 01/01/22 0522  NA 143  --  141 140 137  K 4.3  --  3.7 3.9 3.6  CL 103  --  104 105 102  CO2 28  --  '25 24 28  '$ GLUCOSE 104*  --  82 121* 108*  BUN 13  --  '11 13 10  '$ CREATININE 0.79 0.82 0.78 0.64 0.67  CALCIUM 9.4  --  9.3 8.6* 8.4*   GFR: Estimated Creatinine Clearance: 80.5 mL/min (by C-G formula based on SCr of 0.67 mg/dL). Liver Function Tests: Recent Labs  Lab 12/28/21 2137  12/30/21 0612  AST 31 36  ALT 13 14  ALKPHOS 61 63  BILITOT 2.6* 2.2*  PROT 7.5 6.9  ALBUMIN 3.7 3.5   Recent Labs  Lab 12/28/21 2137  LIPASE 31   No results for input(s): "AMMONIA" in the last 168 hours. Coagulation Profile: No results for input(s): "INR", "PROTIME" in the last 168 hours. Cardiac Enzymes: No results for input(s): "CKTOTAL", "CKMB", "CKMBINDEX", "TROPONINI" in the last 168 hours. BNP (last 3 results) No results for input(s): "PROBNP" in the last 8760 hours. HbA1C: No results for input(s): "HGBA1C" in the last 72 hours. CBG: No results for input(s): "GLUCAP" in the last 168 hours. Lipid Profile: No results for input(s): "CHOL", "HDL", "LDLCALC", "TRIG", "CHOLHDL", "LDLDIRECT" in the last 72 hours. Thyroid Function Tests: No results for input(s): "TSH", "T4TOTAL", "FREET4", "T3FREE", "THYROIDAB" in the last 72 hours. Anemia Panel: No results for input(s): "VITAMINB12", "FOLATE", "FERRITIN", "TIBC", "IRON", "RETICCTPCT" in the last 72 hours. Urine analysis:    Component Value Date/Time   COLORURINE YELLOW (A) 12/28/2021 2140   APPEARANCEUR CLEAR (A) 12/28/2021 2140   LABSPEC >1.046 (H) 12/28/2021 2140   PHURINE 6.0 12/28/2021 2140   GLUCOSEU NEGATIVE 12/28/2021 2140   HGBUR SMALL (A) 12/28/2021 2140   BILIRUBINUR NEGATIVE 12/28/2021 2140   KETONESUR 5 (A) 12/28/2021 2140   PROTEINUR NEGATIVE 12/28/2021 2140   NITRITE NEGATIVE 12/28/2021 2140   LEUKOCYTESUR MODERATE (A) 12/28/2021 2140   Sepsis Labs: '@LABRCNTIP'$ (procalcitonin:4,lacticidven:4)  ) Recent Results (from the past 240 hour(s))  Urine Culture     Status: Abnormal   Collection Time: 12/28/21  9:40 PM   Specimen: Urine, Random  Result Value Ref Range Status   Specimen Description   Final    URINE, RANDOM Performed at [facility], [address] [place], [place] [address]    Special Requests   Final    NONE Performed at [facility], [address]  [place], [place] [address]    Culture (A)  Final    <10,000 COLONIES/mL INSIGNIFICANT GROWTH Performed at [facility], [place] [address], [place], [place] [address]    Report Status 12/30/2021 FINAL  Final  Surgical pcr screen     Status: None   Collection Time: 12/30/21  3:50 PM   Specimen: Nasal Mucosa; Nasal Swab  Result Value Ref Range Status   MRSA, PCR NEGATIVE NEGATIVE Final   Staphylococcus aureus NEGATIVE NEGATIVE Final    Comment: (NOTE) The Xpert SA Assay (FDA approved for NASAL specimens in patients 31 years of age and older), is one component of a comprehensive surveillance program. It is not intended to diagnose infection nor to guide or monitor treatment. Performed at [facility], [address], [place], [place] [address]          Radiology Studies: CT ABDOMEN PELVIS W CONTRAST  Result Date: 12/30/2021 CLINICAL DATA:  Epigastric pain. EXAM: CT ABDOMEN AND PELVIS WITH CONTRAST TECHNIQUE: Multidetector CT imaging of the abdomen and pelvis was performed using the standard protocol following bolus administration of intravenous contrast. RADIATION DOSE REDUCTION: This exam was performed according to the departmental dose-optimization program which includes automated exposure control, adjustment of the mA and/or kV according to patient size and/or use of iterative reconstruction technique. CONTRAST:  140m OMNIPAQUE IOHEXOL 300 MG/ML  SOLN COMPARISON:  CT abdomen pelvis dated 12/29/2021. FINDINGS: Lower chest: There is trace pleural fluid bilaterally with mild associated atelectasis. Hepatobiliary: No focal liver abnormality is seen. Status post cholecystectomy. No biliary dilatation. Pancreas: Atrophic. No pancreatic ductal dilatation or surrounding inflammatory changes. Spleen: The spleen measures 13 cm in craniocaudal dimension, similar to prior exams. No focal abnormality. Adrenals/Urinary Tract: Adrenal glands are unremarkable. Kidneys are normal, without renal  calculi, focal lesion, or hydronephrosis. Bladder is unremarkable. Stomach/Bowel: The patient is status post gastric bypass. There is a small hiatal hernia, unchanged. There is colonic diverticulosis without evidence of diverticulitis. No pericecal inflammatory changes are noted to suggest acute appendicitis. There is a ventral abdominal wall hernia to the right of midline and superior to the umbilicus containing a loop of small bowel which results in a small bowel obstruction. The degree of small bowel distension appears similar to prior exam. There is a small amount of fluid within the hernia sac, concerning for strangulation. Vascular/Lymphatic: Aortic atherosclerosis. No enlarged abdominal or pelvic lymph nodes. Reproductive: Status post hysterectomy. No adnexal masses. Other: No free intraperitoneal fluid. An abandoned spinal cord stimulator lead is noted. Musculoskeletal: Degenerative changes are seen in the spine. IMPRESSION: Ventral abdominal wall hernia containing a loop of small bowel resulting in small bowel obstruction. Fluid within the hernia sac is concerning for bowel strangulation. Electronically Signed   By: TZerita BoersM.D.   On: 12/30/2021 14:55        Scheduled Meds:  losartan  25 mg Oral Daily   polyethylene glycol  17 g Oral BID   senna  1 tablet Oral Daily    Continuous Infusions:  lactated ringers 100 mL/hr at 01/01/22 0936     LOS: 3 days     NDesma Maxim MD [facility]   If 7PM-7AM, please contact night-coverage [email or web address] Password [email or web address]9/18/2023, 11:03 AM

## 2022-01-01 NOTE — Evaluation (Signed)
Physical Therapy Evaluation Patient Details Name: [name] MRN: [id number] DOB: February 12, 1951 Today's Date: 01/01/2022  History of Present Illness  Morgan Dixon is a 71 y.o. female with medical history significant for Chronic pain on buprenorphine presents to the ED with a 2-day history of generalized abdominal pain with nausea. Partial small bowel obstruction, s/p laparoscopic repair on 9/16.  Clinical Impression  Pt presents to PT in bed and requires encouragement to participate in therapy services.  Pt appears lethargic and confused, only AO to self, disoriented to situation/location/time and had difficulty maintaining arousal during evaluation. Requires maxA with frequent cueing for hand placement for bed mobility. Further mobility deferred at this time d/t fatigue and pain. Would benefit from skilled PT at SNF to address above deficits in functional mobility, strength, and activity tolerance to promote optimal return to PLOF.      Recommendations for follow up therapy are one component of a multi-disciplinary discharge planning process, led by the attending physician.  Recommendations may be updated based on patient status, additional functional criteria and insurance authorization.  Follow Up Recommendations Skilled nursing-short term rehab (<3 hours/day) Can patient physically be transported by private vehicle: No    Assistance Recommended at Discharge Frequent or constant Supervision/Assistance  Patient can return home with the following  Two people to help with walking and/or transfers;Direct supervision/assist for medications management;Assist for transportation;A lot of help with walking and/or transfers;Two people to help with bathing/dressing/bathroom;Direct supervision/assist for financial management    Equipment Recommendations Other (comment) (TBD)  Recommendations for Other Services       Functional Status Assessment Patient has had a recent decline in their  functional status and demonstrates the ability to make significant improvements in function in a reasonable and predictable amount of time.     Precautions / Restrictions Precautions Precautions: Fall Precaution Comments: abdominal binder Restrictions Weight Bearing Restrictions: No      Mobility  Bed Mobility Overal bed mobility: Needs Assistance Bed Mobility: Rolling Rolling: Max assist         General bed mobility comments: increased time and verbal cues, unable to perform sup<>sit bed mobility at this time d/t pain and lethargy    Transfers                   General transfer comment: unable/unsafe to attempt at this time    Ambulation/Gait               General Gait Details: unable/unsafe to attempt at this time  Stairs            Wheelchair Mobility    Modified Rankin (Stroke Patients Only)       Balance       Sitting balance - Comments: unable to attempt at this time                                     Pertinent Vitals/Pain Pain Assessment Pain Assessment: 0-10 Pain Location: abdomen, pt reports pain throughout evaluation, but unable to specify location or intensity Pain Descriptors / Indicators: Constant, Grimacing, Crying, Jabbing, Operative site guarding Pain Intervention(s): Monitored during session, Limited activity within patient's tolerance, Repositioned    Home Living Family/patient expects to be discharged to:: Assisted living                   Additional Comments: From chart review, pt from [facility]. no meaningful  PLOF/hx could be obtained from pt at this time    Prior Function Prior Level of Function : Patient poor historian/Family not available             Mobility Comments: pt unable to provide this information       Hand Dominance        Extremity/Trunk Assessment   Upper Extremity Assessment Upper Extremity Assessment: Generalized weakness, at least 3-/5, able to move B  UE against gravity through full AROM, unable to maintain or tolerate resistance d/t weakness    Lower Extremity Assessment Lower Extremity Assessment: Generalized weakness at least 3-/5, able to move B LE against gravity through full AROM, unable to maintain or tolerate resistance d/t weakness       Communication      Cognition Arousal/Alertness: Awake/alert Behavior During Therapy: Anxious Overall Cognitive Status: No family/caregiver present to determine baseline cognitive functioning                                 General Comments: Pt AO to self only, required cueing for location and year. unable to answer PLOF/hx questions        General Comments      Exercises Total Joint Exercises Ankle Circles/Pumps: AROM, Both, 10 reps Quad Sets: Strengthening, Both, 10 reps Bed mobility: B rolling w cueing for hand placement, scooting up in bed, logrolling edu provided   Assessment/Plan    PT Assessment Patient needs continued PT services  PT Problem List Decreased strength;Decreased activity tolerance;Decreased mobility;Pain       PT Treatment Interventions DME instruction;Functional mobility training;Balance training;Patient/family education;Gait training;Therapeutic activities;Neuromuscular re-education;Stair training;Therapeutic exercise    PT Goals (Current goals can be found in the Care Plan section)  Acute Rehab PT Goals Patient Stated Goal: to go home PT Goal Formulation: With patient Time For Goal Achievement: 01/14/22 Potential to Achieve Goals: Fair    Frequency Min 2X/week     Co-evaluation               AM-PAC PT "6 Clicks" Mobility  Outcome Measure Help needed turning from your back to your side while in a flat bed without using bedrails?: A Lot Help needed moving from lying on your back to sitting on the side of a flat bed without using bedrails?: A Lot Help needed moving to and from a bed to a chair (including a wheelchair)?:  Total Help needed standing up from a chair using your arms (e.g., wheelchair or bedside chair)?: Total   Help needed climbing 3-5 steps with a railing? : Total 6 Click Score: 7    End of Session   Activity Tolerance: Patient limited by lethargy Patient left: in bed;with call bell/phone within reach;with nursing/sitter in room;with bed alarm set Nurse Communication: Other (comment) (SCD unit damaged, nursing notified) IV leak, nursing notified PT Visit Diagnosis: Pain;Muscle weakness (generalized) (M62.81);Other abnormalities of gait and mobility (R26.89) Pain - part of body:  (abdomen)    Time: 5093-2671 PT Time Calculation (min) (ACUTE ONLY): 35 min   Charges:              Glenice Laine MPH, SPT 01/01/22, 11:58 AM

## 2022-01-01 NOTE — Anesthesia Postprocedure Evaluation (Signed)
Anesthesia Post Note  Patient: Morgan Dixon  Procedure(s) Performed: XI ROBOTIC ASSISTED VENTRAL HERNIA (Abdomen) XI ROBOTIC ASSISTED LAPAROSCOPIC LYSIS OF ADHESION (Abdomen)  Patient location during evaluation: PACU Anesthesia Type: General Level of consciousness: awake and alert Pain management: pain level controlled Vital Signs Assessment: post-procedure vital signs reviewed and stable Respiratory status: spontaneous breathing, nonlabored ventilation, respiratory function stable and patient connected to nasal cannula oxygen Cardiovascular status: stable and blood pressure returned to baseline Postop Assessment: no apparent nausea or vomiting Anesthetic complications: no   No notable events documented.   Last Vitals:  Vitals:   01/01/22 0800 01/01/22 1140  BP: (!) 168/76 (!) 163/65  Pulse: 82   Resp: 20   Temp: 36.7 C   SpO2: 92% (!) 88%    Last Pain:  Vitals:   01/01/22 0900  TempSrc:   PainSc: [place]

## 2022-01-02 DIAGNOSIS — K56600 Partial intestinal obstruction, unspecified as to cause: Secondary | ICD-10-CM | POA: Diagnosis not present

## 2022-01-02 LAB — BASIC METABOLIC PANEL
Anion gap: 7 (ref 5–15)
BUN: 12 mg/dL (ref 8–23)
CO2: 29 mmol/L (ref 22–32)
Calcium: 8.6 mg/dL — ABNORMAL LOW (ref 8.9–10.3)
Chloride: 103 mmol/L (ref 98–111)
Creatinine, Ser: 0.61 mg/dL (ref 0.44–1.00)
GFR, Estimated: >60 mL/min (ref >60)
Glucose, Bld: 122 mg/dL — ABNORMAL HIGH (ref 70–99)
Potassium: 3.4 mmol/L — ABNORMAL LOW (ref 3.5–5.1)
Sodium: 139 mmol/L (ref 135–145)

## 2022-01-02 LAB — CBC
HCT: 33.9 % — ABNORMAL LOW (ref 36.0–46.0)
Hemoglobin: 10.6 g/dL — ABNORMAL LOW (ref 12.0–15.0)
MCH: 27.2 pg (ref 26.0–34.0)
MCHC: 31.3 g/dL (ref 30.0–36.0)
MCV: 86.9 fL (ref 80.0–100.0)
Platelets: 165 10*3/uL (ref 150–400)
RBC: 3.9 MIL/uL (ref 3.87–5.11)
RDW: 16.8 % — ABNORMAL HIGH (ref 11.5–15.5)
WBC: 7 10*3/uL (ref 4.0–10.5)
nRBC: 0 % (ref 0.0–0.2)

## 2022-01-02 MED ORDER — BISACODYL 10 MG RE SUPP
10.0000 mg | Freq: Every day | RECTAL | Status: DC
  Filled 2022-01-02: qty 1

## 2022-01-02 NOTE — Hospital Course (Addendum)
Taken from prior notes.   BILLIJO DILLING is a 71 y.o. female with medical history significant for Chronic pain on buprenorphine presents to the ED with a 2-day history of generalized abdominal pain with nausea.  Denies diarrhea or dysuria, fever or chills.  Found to have ventral incarcerated hernia s/p robotic assisted laparoscopic repair with general surgery.  9/19: Patient remained stable and tolerating clear liquid diet.  No bowel movement yet.  Surgery is recommending mobilization. PT/OT recommending SNF. TOC was consulted  9/20: Patient continued to improve, passing flatus, no bowel movement yet.  Had a bed offer at [facility] authorization. Surgery advance diet to soft which she is tolerating well.  9/21: Patient remained stable.  Eating and drinking without any difficulty.  Had a bowel movement.  Awaiting insurance authorization for SNF.  9/22: Blood pressure elevated requiring using as needed.  Increasing the dose of home losartan to 100 mg daily. Might need to add another agent. Having some nausea this morning.  Had a bowel movement, decreased appetite secondary to nausea. Need peer to peer for SNF approval, initiated and waiting for the call back.

## 2022-01-02 NOTE — Plan of Care (Signed)
  Problem: Activity: Goal: Risk for activity intolerance will decrease Outcome: Progressing   Problem: Nutrition: Goal: Adequate nutrition will be maintained Outcome: Progressing   Problem: Elimination: Goal: Will not experience complications related to bowel motility Outcome: Not Progressing Goal: Will not experience complications related to urinary retention Outcome: Progressing   Problem: Pain Managment: Goal: General experience of comfort will improve Outcome: Progressing   Problem: Safety: Goal: Ability to remain free from injury will improve Outcome: Progressing   Problem: Skin Integrity: Goal: Risk for impaired skin integrity will decrease Outcome: Progressing

## 2022-01-02 NOTE — Assessment & Plan Note (Signed)
History of CVA -Continue home Lipitor -Home Plavix is on hold due to recent surgery

## 2022-01-02 NOTE — Progress Notes (Signed)
[place] [facility] Day(s): 4.   Post op day(s): 3 Days Post-Op.   Interval History:  Patient seen and examined No acute events or new complaints overnight.  Patient remains very somnolent; arouses to verbal stimuli Last narcotic pain medication 09/18; IV discontinued; did get flexeril this AM Still with abdominal pain Labs are reassuring aside from very mild hypokalemia to 3.4 Started on CLD No reported bowel function   Vital signs in last 24 hours: [min-max] current  Temp:  [98.8 F (37.1 C)-98.9 F (37.2 C)] 98.9 F (37.2 C) (09/19 0436) Pulse Rate:  [65-70] 70 (09/19 0436) Resp:  [17-18] 18 (09/19 0436) BP: (163-177)/(65-72) 166/68 (09/19 0436) SpO2:  [88 %-97 %] 97 % (09/19 0436)     Height: '5\' 6"'$  (167.6 cm) Weight: 108.9 kg BMI (Calculated): 38.76   Intake/Output last 2 shifts:  09/18 0701 - 09/19 0700 In: 1585.1 [P.O.:480; I.V.:1105.1] Out: 250 [Urine:250]   Physical Exam:  Constitutional: alert, cooperative, appears uncomfortable Respiratory: breathing non-labored at rest  Cardiovascular: regular rate and sinus rhythm  Gastrointestinal: soft, incisional and somewhat diffuse soreness, non-distended, no rebound/guarding. Abdominal binder in place Integumentary: Laparoscopic incisions are CDI with dermabond, no erythema or drainage   Labs:     Latest Ref Rng & Units 01/02/2022    6:44 AM 01/01/2022    5:22 AM 12/31/2021    6:29 AM  CBC  WBC 4.0 - 10.5 K/uL 7.0  9.5  5.6   Hemoglobin 12.0 - 15.0 g/dL 10.6  11.0  10.0   Hematocrit 36.0 - 46.0 % 33.9  35.3  32.6   Platelets 150 - 400 K/uL 165  169  154       Latest Ref Rng & Units 01/02/2022    6:44 AM 01/01/2022    5:22 AM 12/31/2021    6:29 AM  CMP  Glucose 70 - 99 mg/dL 122  108  121   BUN 8 - 23 mg/dL '12  10  13   '$ Creatinine 0.44 - 1.00 mg/dL 0.61  0.67  0.64   Sodium 135 - 145 mmol/L 139  137  140   Potassium 3.5 - 5.1 mmol/L 3.4  3.6  3.9   Chloride 98 - 111  mmol/L 103  102  105   CO2 22 - 32 mmol/L '29  28  24   '$ Calcium 8.9 - 10.3 mg/dL 8.6  8.4  8.6      Imaging studies: No new pertinent imaging studies   Assessment/Plan:  71 y.o. female 3 Days Post-Op s/p robotic assisted laparoscopic repair of incarcerated abdominal hernia (3-10 cm).    - CLD for now, should her mentation improve and she is doing well we can advance  - Continue IVF support until on FLD  - Monitor abdominal examination; on-going bowel function   - Pain control prn; antiemetics prn - Out of bed; PT following; recommending SNF - Continue abdominal binder   - Discharge Planning; Still awaiting advancement of diet, not mobilizing well, recommendations currently are SNF  All of the above findings and recommendations were discussed with the patient, and the medical team, and all of patient's questions were answered to her expressed satisfaction.  -- Edison Simon, PA-C [facility] 01/02/2022, 8:01 AM M-F: 7am - 4pm

## 2022-01-02 NOTE — Plan of Care (Signed)
  Problem: Health Behavior/Discharge Planning: Goal: Ability to manage health-related needs will improve Outcome: Progressing   Problem: Clinical Measurements: Goal: Respiratory complications will improve Outcome: Progressing   Problem: Activity: Goal: Risk for activity intolerance will decrease Outcome: Progressing   Problem: Nutrition: Goal: Adequate nutrition will be maintained Outcome: Progressing   Problem: Pain Managment: Goal: General experience of comfort will improve Outcome: Progressing   Problem: Safety: Goal: Ability to remain free from injury will improve Outcome: Progressing

## 2022-01-02 NOTE — NC FL2 (Signed)
[place] LEVEL OF CARE SCREENING TOOL     IDENTIFICATION  Patient Name: Morgan Dixon Birthdate: March 22, 1951 Sex: female Admission Date (Current Location): 12/28/2021  [facility] and [place] Number:  Engineering geologist and Address:         Provider Number:    Attending Physician Name and Address:  Lorella Nimrod, MD  Relative Name and Phone Number:       Current Level of Care: Hospital Recommended Level of Care: [place] Prior Approval Number:    Date Approved/Denied:   PASRR Number:    Discharge Plan:      Current Diagnoses: Patient Active Problem List   Diagnosis Date Noted   Periumbilical abdominal pain    Partial small bowel obstruction ([place]) 12/29/2021   Ventral hernia with obstruction and without gangrene 12/29/2021   Obesity 08/27/2020   Benign essential HTN 08/27/2020   CVA (cerebral vascular accident) ([place]) 83/15/1761   Acute metabolic encephalopathy 60/73/7106   Acute lower UTI 08/17/2020   Hypokalemia 08/17/2020   Left renal 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 to advanced prior to discharge)  AMBULATORY STATUS COMMUNICATION OF NEEDS Skin   Extensive Assist Verbally Surgical wounds                       Personal Care Assistance Level of Assistance              Functional Limitations Info             SPECIAL CARE FACTORS FREQUENCY  PT (By licensed PT), OT (By licensed OT)                    Contractures Contractures  Info: Not present    Additional Factors Info  Code Status, Allergies Code Status Info: Full Allergies Info: Bupropion, Diazepam, Penicillins, Rofecoxib, Latex, Prozac (Fluoxetine Hcl), Zinc Oxide ([place])           Current Medications (01/02/2022):  This is the current hospital active medication list Current Facility-Administered Medications  Medication Dose Route Frequency Provider Last Rate Last Admin   acetaminophen (TYLENOL) tablet 650 mg  650 mg Oral Q6H PRN Ronny Bacon, MD   650 mg at 01/02/22 0804   Or   acetaminophen (TYLENOL) suppository 650 mg  650 mg Rectal Q6H PRN Ronny Bacon, MD       atorvastatin (LIPITOR) tablet 40 mg  40 mg Oral Daily Gwynne Edinger, MD   40 mg at 01/02/22 0804   cyclobenzaprine (FLEXERIL) tablet 5 mg  5 mg Oral TID PRN Tylene Fantasia, PA-C   5 mg at 01/02/22 2694   hydrALAZINE (APRESOLINE) injection 10 mg  10 mg Intravenous Q6H PRN Ronny Bacon, MD   10 mg at 01/02/22 0442   HYDROcodone-acetaminophen (NORCO/VICODIN) 5-325 MG per tablet 1-2 tablet  1-2 tablet Oral Q4H PRN Ronny Bacon, MD   2 tablet at 12/31/21 1540   HYDROmorphone (DILAUDID) injection 2 mg  2 mg Intravenous Q3H  PRN Gwynne Edinger, MD   2 mg at 01/01/22 0933   ketorolac (TORADOL) 15 MG/ML injection 15 mg  15 mg Intravenous Q6H PRN Tylene Fantasia, PA-C   15 mg at 01/02/22 9518   lactated ringers infusion   Intravenous Continuous Ronny Bacon, MD 100 mL/hr at 01/02/22 0816 Infusion Verify at 01/02/22 0816   losartan (COZAAR) tablet 25 mg  25 mg Oral Daily Gwynne Edinger, MD   25 mg at 01/02/22 0804   ondansetron (ZOFRAN) tablet 4 mg  4 mg Oral Q6H PRN Ronny Bacon, MD       Or   ondansetron [facility]) injection 4 mg  4 mg Intravenous Q6H PRN Ronny Bacon, MD   4 mg at 01/01/22 1233   polyethylene glycol (MIRALAX / GLYCOLAX) packet 17 g  17 g Oral BID Gwynne Edinger, MD   17 g at 01/02/22 0804   pregabalin (LYRICA) capsule 200 mg  200 mg Oral  Daily Gwynne Edinger, MD   200 mg at 01/01/22 1240   senna (SENOKOT) tablet 8.6 mg  1 tablet Oral Daily Gwynne Edinger, MD   8.6 mg at 12/31/21 1306   sertraline (ZOLOFT) tablet 100 mg  100 mg Oral QHS Gwynne Edinger, MD   100 mg at 01/01/22 2206     Discharge Medications: Please see discharge summary for a list of discharge medications.  Relevant Imaging Results:  Relevant Lab Results:   Additional Information SSN: [place]  Beverly Sessions, RN

## 2022-01-02 NOTE — Progress Notes (Signed)
Occupational Therapy Treatment Patient Details Name: Morgan Dixon MRN: [id number] DOB: 1950-07-16 Today's Date: 01/02/2022   History of present illness Morgan Dixon is a 71 y.o. female with medical history significant for Chronic pain on buprenorphine presents to the ED with a 2-day history of generalized abdominal pain with nausea.   OT comments  Upon entering session, pt resting in bed. Initially deferring ADL tasks such as grooming and toileting despite max encouragement, however, agreeable to get repositioned in bed and eat breakfast. Pt required Max A +2 for bed mobility to scoot pt up towards [facility] and set up for self-feeding. Pt with improved alertness this date, however, was disoriented to time/situation and oriented to person/place. Pt attempting to give PLOF/history, however, providing information from prior residence. Spoke with daughter on the phone who reported pt is from [place] at [facility] (moved in at beginning of September, lives on 2nd floor, has elevator, walk in shower). PTA pt was Mod I for functional mobility using a rollator and independent with ADLs/IADLs. Pt left as received with all needs in reach. Pt is making progress toward goal completion. D/C recommendation remains appropriate. OT will continue to follow acutely.     Recommendations for follow up therapy are one component of a multi-disciplinary discharge planning process, led by the attending physician.  Recommendations may be updated based on patient status, additional functional criteria and insurance authorization.    Follow Up Recommendations  Skilled nursing-short term rehab (<3 hours/day)    Assistance Recommended at Discharge Frequent or constant Supervision/Assistance  Patient can return home with the following  A lot of help with walking and/or transfers;A lot of help with bathing/dressing/bathroom;Assistance with cooking/housework;Assist for transportation;Help with stairs or ramp for entrance    Equipment Recommendations  Other (comment) (defer to next venue of care)    Recommendations for Other Services      Precautions / Restrictions Precautions Precautions: Fall Precaution Comments: abdominal binder Restrictions Weight Bearing Restrictions: No       Mobility Bed Mobility Overal bed mobility: Needs Assistance             General bed mobility comments: Max A +2 to scoot pt up towards [facility]    Transfers                         Balance       Sitting balance - Comments: pt deferred       Standing balance comment: pt deferred                           ADL either performed or assessed with clinical judgement   ADL Overall ADL's : Needs assistance/impaired Eating/Feeding: Set up                                     General ADL Comments: Pt deferring further ADL tasks such as grooming and toileting. RN reported that she was able to t/f to [facility] with 1 person assist.    Extremity/Trunk Assessment Upper Extremity Assessment Upper Extremity Assessment: Generalized weakness   Lower Extremity Assessment Lower Extremity Assessment: Generalized weakness        Vision Baseline Vision/History: 1 Wears glasses Patient Visual Report: No change from baseline     Perception     Praxis      Cognition Arousal/Alertness: Awake/alert Behavior During  Therapy: WFL for tasks assessed/performed Overall Cognitive Status: History of cognitive impairments - at baseline                                 General Comments: Pt with improved alertness this date, able to follow all commands. Daughter reports pt had stroke ~1 year ago and cognition has not been the same since then. Today, pt was disoriented to time/situation and oriented to person/place.        Exercises      Shoulder Instructions       General Comments      Pertinent Vitals/ Pain       Pain Assessment Pain Assessment: Faces Faces Pain Scale:  Hurts a little bit Pain Location: abdomen Pain Descriptors / Indicators: Constant Pain Intervention(s): Monitored during session, Repositioned  Home Living Family/patient expects to be discharged to:: Private residence Living Arrangements: Alone Available Help at Discharge: Family;Available PRN/intermittently Type of Home: Independent living facility Home Access: Elevator     Home Layout: One level     Bathroom Shower/Tub: Occupational psychologist: Standard     Home Equipment: Rollator (4 wheels)   Additional Comments: Spoke with daughter on 9/19 to clarify home set up and PLOF.      Prior Functioning/Environment              Frequency  Min 2X/week        Progress Toward Goals  OT Goals(current goals can now be found in the care plan section)  Progress towards OT goals: Progressing toward goals  Acute Rehab OT Goals Patient Stated Goal: reduce pain OT Goal Formulation: With family (spoke with daughter on the phone) Time For Goal Achievement: 01/14/22 Potential to Achieve Goals: Good  Plan Discharge plan remains appropriate;Frequency remains appropriate    Co-evaluation                 AM-PAC OT "6 Clicks" Daily Activity     Outcome Measure   Help from another person eating meals?: A Little Help from another person taking care of personal grooming?: A Lot Help from another person toileting, which includes using toliet, bedpan, or urinal?: A Lot Help from another person bathing (including washing, rinsing, drying)?: A Lot Help from another person to put on and taking off regular upper body clothing?: A Lot Help from another person to put on and taking off regular lower body clothing?: A Lot 6 Click Score: 13    End of Session    OT Visit Diagnosis: Other abnormalities of gait and mobility (R26.89);Muscle weakness (generalized) (M62.81);Pain Pain - part of body:  (abdomen)   Activity Tolerance Patient limited by pain;Patient limited  by fatigue   Patient Left in bed;with SCD's reapplied;with call bell/phone within reach;with bed alarm set   Nurse Communication Mobility status        Time: 6433-2951 OT Time Calculation (min): 20 min  Charges: OT General Charges $OT Visit: 1 Visit OT Treatments $Self Care/Home Management : 8-22 mins   [facility] MS, OTR/L ascom [phone]  01/02/22, 11:05 AM

## 2022-01-02 NOTE — Progress Notes (Signed)
  Progress Note   Patient: Morgan Dixon GHW:[id number] DOB: 11-25-1950 DOA: 12/28/2021     4 DOS: the patient was seen and examined on 01/02/2022   Brief hospital course: Taken from prior notes.   KEAGHAN BOWENS is a 71 y.o. female with medical history significant for Chronic pain on buprenorphine presents to the ED with a 2-day history of generalized abdominal pain with nausea.  Denies diarrhea or dysuria, fever or chills.  Found to have ventral incarcerated hernia s/p robotic assisted laparoscopic repair with general surgery.  9/19: Patient remained stable and tolerating clear liquid diet.  No bowel movement yet.  Surgery is recommending mobilization. PT/OT recommending SNF. TOC was consulted       Assessment and Plan: * Partial small bowel obstruction (HCC) Secondary to incarcerated ventral hernia s/p robotic laparoscopic repair by general surgery.  Patient tolerated the procedure well. Abdominal pain improving.  Currently tolerating clear liquid diet.  No bowel movement yet. -Continue with pain management -Advance diet as tolerated -Mobilize patient -PT is recommending SNF  Ventral hernia with obstruction and without gangrene See above.  Chronic, continuous use of opioids Patient was on Suboxone at home which is being held as she is currently receiving opioids. -Continue with current pain management, we will restart home Suboxone on discharge  Benign essential HTN Blood pressure within goal. -Continue home losartan  CVA (cerebral vascular accident) ([place]) History of CVA -Continue home Lipitor -Home Plavix is on hold due to recent surgery  Obesity Estimated body mass index is 38.74 kg/m as calculated from the following:   Height as of this encounter: '5\' 6"'$  (1.676 m).   Weight as of this encounter: 108.9 kg.   -Will complicate overall prognosis  Hypokalemia Mild hypokalemia with potassium of 3.4 today. -Replete potassium and monitor   Subjective:  Patient was seen and examined today.  Denies any abdominal pain, nausea or vomiting.  No bowel movement yet.  Tolerating clear liquid diet well.  Physical Exam: Vitals:   01/02/22 0436 01/02/22 0810 01/02/22 1834 01/02/22 1922  BP: (!) 166/68 138/67 (!) 159/61 (!) 146/73  Pulse: 70 72 73 70  Resp: '18 17 19 18  '$ Temp: 98.9 F (37.2 C) 97.7 F (36.5 C) 98.4 F (36.9 C) 98.8 F (37.1 C)  TempSrc: Oral  Oral   SpO2: 97% 92% 94% 95%  Weight:      Height:       General.  Obese elderly lady, in no acute distress. Pulmonary.  Lungs clear bilaterally, normal respiratory effort. CV.  Regular rate and rhythm, no JVD, rub or murmur. Abdomen.  Soft, nontender, nondistended, BS positive. CNS.  Alert and oriented .  No focal neurologic deficit. Extremities.  Trace LE edema, no cyanosis, pulses intact and symmetrical. Psychiatry.  Judgment and insight appears normal.  Data Reviewed: Prior data reviewed  Family Communication:   Disposition: Status is: Inpatient Remains inpatient appropriate because: Severity of illness   Planned Discharge Destination: Skilled nursing facility  Time spent: 45 minutes  This record has been created using Systems analyst. Errors have been sought and corrected,but may not always be located. Such creation errors do not reflect on the standard of care.  Author: Lorella Nimrod, MD 01/02/2022 7:43 PM  For on call review [email or web address].

## 2022-01-02 NOTE — Assessment & Plan Note (Signed)
Estimated body mass index is 38.74 kg/m as calculated from the following:   Height as of this encounter: '5\' 6"'$  (1.676 m).   Weight as of this encounter: 108.9 kg.   -Will complicate overall prognosis

## 2022-01-02 NOTE — Progress Notes (Addendum)
Physical Therapy Treatment Patient Details Name: Morgan Dixon MRN: [id number] DOB: September 30, 1950 Today's Date: 01/02/2022   History of Present Illness Morgan Dixon is a 71 y.o. female with medical history significant for Chronic pain on buprenorphine presented to the ED with a 2-day history of generalized abdominal pain with nausea. Found with small bowel obstruction with laparoscopy 9/16.    PT Comments    Pt still c/o abdominal pain as expected but was able to do much more than she or this PT expected.  She had slow, guarded, walker reliant gait, but was ultimately able to circumambulate the nurses station with no overt LOBs.  She does not normally need AD and today was clearly reliant on RW; she also had O2 drop to mid 80s on room air with only mild DOE but pt clearly fatigued at completion of the loop and did not have the energy to do much more.  Constant cuing and encouragement, a few small standing rest breaks, but certainly more activity and ambulation than she has done since surgery.    Recommendations for follow up therapy are one component of a multi-disciplinary discharge planning process, led by the attending physician.  Recommendations may be updated based on patient status, additional functional criteria and insurance authorization.  Follow Up Recommendations  Skilled nursing-short term rehab (<3 hours/day) Can patient physically be transported by private vehicle: Yes   Assistance Recommended at Discharge Frequent or constant Supervision/Assistance  Patient can return home with the following Direct supervision/assist for medications management;Assist for transportation;Direct supervision/assist for financial management;A little help with walking and/or transfers;A little help with bathing/dressing/bathroom;Assistance with cooking/housework   Equipment Recommendations  Rolling walker (2 wheels)    Recommendations for Other Services       Precautions / Restrictions  Precautions Precautions: Fall Restrictions Weight Bearing Restrictions: No     Mobility  Bed Mobility Overal bed mobility: Needs Assistance       Supine to sit: Min assist Sit to supine: Min assist   General bed mobility comments: Pt needing slow and guarded strategy 2/2 abdominal pain but was able to get herself up to sitting EOB with only light physical assist    Transfers Overall transfer level: Needs assistance Equipment used: Rolling walker (2 wheels) Transfers: Sit to/from Stand Sit to Stand: Min assist           General transfer comment: close supervision to insure safety but pt able to rise to standing with appropriate UE use with only very light cuing to insure forward translation during transition to upright/standing    Ambulation/Gait Ambulation/Gait assistance: Min guard Gait Distance (Feet): 200 Feet Assistive device: Rolling walker (2 wheels)         General Gait Details: Pt with slow but safe effort circumambulating the nurses station with guarded, AD reliant but ultimately safe effort.  Her HR remained stable t/o the effort but O2 (on room air) did drop into the mid 80s, recovered to ~90% after 2-3 minutes of seated rest after the effort.   Stairs             Wheelchair Mobility    Modified Rankin (Stroke Patients Only)       Balance Overall balance assessment: Needs assistance Sitting-balance support: Feet supported, Single extremity supported Sitting balance-Leahy Scale: Good     Standing balance support: Bilateral upper extremity supported Standing balance-Leahy Scale: Fair Standing balance comment: reliant on walker but no LOBs during prolonged standing activity  Cognition Arousal/Alertness: Awake/alert Behavior During Therapy: WFL for tasks assessed/performed Overall Cognitive Status: History of cognitive impairments - at baseline                                           Exercises      General Comments        Pertinent Vitals/Pain Pain Assessment Pain Assessment: 0-10 Pain Score: 5  Pain Location: abdomen    Home Living                          Prior Function            PT Goals (current goals can now be found in the care plan section) Progress towards PT goals: Progressing toward goals    Frequency    Min 2X/week      PT Plan Current plan remains appropriate    Co-evaluation              AM-PAC PT "6 Clicks" Mobility   Outcome Measure  Help needed turning from your back to your side while in a flat bed without using bedrails?: A Lot Help needed moving from lying on your back to sitting on the side of a flat bed without using bedrails?: A Lot Help needed moving to and from a bed to a chair (including a wheelchair)?: Total Help needed standing up from a chair using your arms (e.g., wheelchair or bedside chair)?: Total Help needed to walk in hospital room?: Total Help needed climbing 3-5 steps with a railing? : Total 6 Click Score: 8    End of Session Equipment Utilized During Treatment: Gait belt Activity Tolerance: Patient limited by fatigue Patient left: with chair alarm set;with call bell/phone within reach Nurse Communication:  (O2 drop to mid 80s on room air during activity) PT Visit Diagnosis: Pain;Muscle weakness (generalized) (M62.81);Other abnormalities of gait and mobility (R26.89) Pain - part of body:  (abdomen)     Time: 0981-1914 PT Time Calculation (min) (ACUTE ONLY): 25 min  Charges:  $Gait Training: 8-22 mins $Therapeutic Exercise: 8-22 mins                     Kreg Shropshire, DPT 01/02/2022, 4:53 PM

## 2022-01-02 NOTE — TOC Progression Note (Signed)
Transition of Care [facility]) - Progression Note    Patient Details  Name: Morgan Dixon MRN: [id number] Date of Birth: 1950/08/14  [facility]) CM/SW Contact  Beverly Sessions, RN Phone Number: 01/02/2022, 4:23 PM  Clinical Narrative:     Bed offers presented to daughter She is to review and follow up with me tomorrow on her decision        Expected Discharge Plan and Services                                                 Social Determinants of Health (SDOH) Interventions Food Insecurity Interventions: Intervention Not Indicated Housing Interventions: Intervention Not Indicated Transportation Interventions: Intervention Not Indicated Utilities Interventions: Intervention Not Indicated  Readmission Risk Interventions    08/19/2020    2:01 PM  Readmission Risk Prevention Plan  Medication Screening Complete  Transportation Screening Complete

## 2022-01-02 NOTE — TOC Initial Note (Signed)
Transition of Care [facility]) - Initial/Assessment Note    Patient Details  Name: Morgan Dixon MRN: [id number] Date of Birth: 03/30/1951  Transition of Care [facility]) CM/SW Contact:    Beverly Sessions, RN Phone Number: 01/02/2022, 9:37 AM  Clinical Narrative:                 Admitted for: SBO Admitted from:[facility] KCL:[id number] Current home health/prior home health/DME: rollator   Therapy recommending SNF.  Per Therapy note patient not able to provide meaningful history. Spoke with daughter and discussed recommendations.  She is agreeable to bed search Existing PASSR Fl2 sent for signature Bed search initiated          Patient Goals and CMS Choice        Expected Discharge Plan and Services                                                Prior Living Arrangements/Services                       Activities of Daily Living Home Assistive Devices/Equipment: None ADL Screening (condition at time of admission) Patient's cognitive ability adequate to safely complete daily activities?: Yes Is the patient deaf or have difficulty hearing?: No Does the patient have difficulty seeing, even when wearing glasses/contacts?: No Does the patient have difficulty concentrating, remembering, or making decisions?: No Patient able to express need for assistance with ADLs?: Yes Does the patient have difficulty dressing or bathing?: No Independently performs ADLs?: Yes (appropriate for developmental age) Does the patient have difficulty walking or climbing stairs?: No Weakness of Legs: None Weakness of Arms/Hands: None  Permission Sought/Granted                  Emotional Assessment              Admission diagnosis:  Ventral hernia without obstruction or gangrene [S49.6] Periumbilical abdominal pain [R10.33] Partial small bowel obstruction ([place]) [K56.600] Patient Active Problem List   Diagnosis Date Noted   Periumbilical abdominal pain     Partial small bowel obstruction ([place]) 12/29/2021   Ventral hernia with obstruction and without gangrene 12/29/2021   Obesity 08/27/2020   Benign essential HTN 08/27/2020   CVA (cerebral vascular accident) ([place]) 75/91/6384   Acute metabolic encephalopathy 66/59/9357   Acute lower UTI 08/17/2020   Hypokalemia 08/17/2020   Left renal mass 08/17/2020   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   Bilateral lower extremity edema 12/12/2017   Pain in both lower extremities 12/12/2017   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/16/2017   Chronic, continuous use of opioids 02/14/2015   Chronic pain 12/05/2012   PCP:  Alfonse Flavors, MD Pharmacy:   [place], [place] - [address] [place] [address] Phone: [phone] Fax: [phone]     Social Determinants of Health (SDOH) Interventions Food Insecurity Interventions: Intervention Not Indicated Housing Interventions: Intervention Not Indicated Transportation Interventions: Intervention Not Indicated Utilities Interventions: Intervention Not Indicated  Readmission Risk Interventions    08/19/2020    2:01 PM  Readmission Risk Prevention Plan  Medication Screening Complete  Transportation Screening Complete

## 2022-01-02 NOTE — Assessment & Plan Note (Signed)
Mild hypokalemia with potassium of 3.4 today. -Replete potassium and monitor

## 2022-01-03 DIAGNOSIS — K56600 Partial intestinal obstruction, unspecified as to cause: Secondary | ICD-10-CM | POA: Diagnosis not present

## 2022-01-03 LAB — BASIC METABOLIC PANEL
Anion gap: 7 (ref 5–15)
BUN: 13 mg/dL (ref 8–23)
CO2: 28 mmol/L (ref 22–32)
Calcium: 8.5 mg/dL — ABNORMAL LOW (ref 8.9–10.3)
Chloride: 106 mmol/L (ref 98–111)
Creatinine, Ser: 0.58 mg/dL (ref 0.44–1.00)
GFR, Estimated: >60 mL/min (ref >60)
Glucose, Bld: 102 mg/dL — ABNORMAL HIGH (ref 70–99)
Potassium: 3.6 mmol/L (ref 3.5–5.1)
Sodium: 141 mmol/L (ref 135–145)

## 2022-01-03 LAB — CBC
HCT: 32.8 % — ABNORMAL LOW (ref 36.0–46.0)
Hemoglobin: 10.1 g/dL — ABNORMAL LOW (ref 12.0–15.0)
MCH: 26.8 pg (ref 26.0–34.0)
MCHC: 30.8 g/dL (ref 30.0–36.0)
MCV: 87 fL (ref 80.0–100.0)
Platelets: 157 10*3/uL (ref 150–400)
RBC: 3.77 MIL/uL — ABNORMAL LOW (ref 3.87–5.11)
RDW: 16.6 % — ABNORMAL HIGH (ref 11.5–15.5)
WBC: 4.6 10*3/uL (ref 4.0–10.5)
nRBC: 0 % (ref 0.0–0.2)

## 2022-01-03 NOTE — TOC Progression Note (Signed)
Transition of Care [facility]) - Progression Note    Patient Details  Name: Morgan Dixon MRN: [id number] Date of Birth: [birth year]  Transition of Care [facility]) CM/SW Contact  Beverly Sessions, RN Phone Number: 01/03/2022, 4:01 PM  Clinical Narrative:     Have not received return call from [facility].  Called daughter to follow up on bed offers.  She would like accept offer at [facility] [employer] to start auth.  She confirms she will        Expected Discharge Plan and Services                                                 Social Determinants of Health (SDOH) Interventions Food Insecurity Interventions: Intervention Not Indicated Housing Interventions: Intervention Not Indicated Transportation Interventions: Intervention Not Indicated Utilities Interventions: Intervention Not Indicated  Readmission Risk Interventions    08/19/2020    2:01 PM  Readmission Risk Prevention Plan  Medication Screening Complete  Transportation Screening Complete

## 2022-01-03 NOTE — TOC Progression Note (Signed)
Transition of Care [facility]) - Progression Note    Patient Details  Name: Morgan Dixon MRN: [id number] Date of Birth: December 14, 1950  Transition of Care [facility]) CM/SW Contact  Beverly Sessions, RN Phone Number: 01/03/2022, 3:20 PM  Clinical Narrative:     Followed up with daughter.  She request I call [facility] in [place].  VM left at [phone]       Expected Discharge Plan and Services                                                 Social Determinants of Health (SDOH) Interventions Food Insecurity Interventions: Intervention Not Indicated Housing Interventions: Intervention Not Indicated Transportation Interventions: Intervention Not Indicated Utilities Interventions: Intervention Not Indicated  Readmission Risk Interventions    08/19/2020    2:01 PM  Readmission Risk Prevention Plan  Medication Screening Complete  Transportation Screening Complete

## 2022-01-03 NOTE — Progress Notes (Signed)
Occupational Therapy Treatment Patient Details Name: Morgan Dixon MRN: [id number] DOB: 01/27/1951 Today's Date: 01/03/2022   History of present illness Morgan Dixon is a 71 y.o. female with medical history significant for Chronic pain on buprenorphine presented to the ED with a 2-day history of generalized abdominal pain with nausea. Found with small bowel obstruction with laparoscopy 9/16.   OT comments  Upon entering session, pt resting in bed and agreeable to OT. Pt demonstrated improvements with activity tolerance and required less physical assistance to complete all mobility this date. Pt was motivated to participate in therapy. Pt completed supine to sit with supervision and able to maintain static sitting balance without UE support. Pt completed stand pivot t/f to recliner with Min guard. Pt completed seated grooming tasks with set up A. Pt left sitting up in chair with all needs in reach. Pt is making progress toward goal completion. D/C recommendation remains appropriate. OT will continue to follow acutely.     Recommendations for follow up therapy are one component of a multi-disciplinary discharge planning process, led by the attending physician.  Recommendations may be updated based on patient status, additional functional criteria and insurance authorization.    Follow Up Recommendations  Skilled nursing-short term rehab (<3 hours/day)    Assistance Recommended at Discharge Frequent or constant Supervision/Assistance  Patient can return home with the following  A lot of help with walking and/or transfers;A lot of help with bathing/dressing/bathroom;Assistance with cooking/housework;Assist for transportation;Help with stairs or ramp for entrance   Equipment Recommendations  Other (comment) (defer to next venue of care)    Recommendations for Other Services      Precautions / Restrictions Precautions Precautions: Fall Precaution Comments: abdominal  binder Restrictions Weight Bearing Restrictions: No       Mobility Bed Mobility Overal bed mobility: Needs Assistance       Supine to sit: HOB elevated, Supervision     General bed mobility comments: increased time    Transfers Overall transfer level: Needs assistance Equipment used: Rolling walker (2 wheels) Transfers: Bed to chair/wheelchair/BSC   Stand pivot transfers: Min guard         General transfer comment: VC for anterior weight shift before standing and hand placement     Balance Overall balance assessment: Needs assistance Sitting-balance support: Feet supported, Single extremity supported Sitting balance-Leahy Scale: Good     Standing balance support: Bilateral upper extremity supported Standing balance-Leahy Scale: Fair                             ADL either performed or assessed with clinical judgement   ADL Overall ADL's : Needs assistance/impaired Eating/Feeding: Set up   Grooming: Set up;Minimal assistance;Brushing hair;Oral care;Wash/dry face;Sitting Grooming Details (indicate cue type and reason): Pt completed grooming tasks while sitting EOB including denture care, Min A for hair brushing 2/2 multiple knots at back of hair                                    Extremity/Trunk Assessment Upper Extremity Assessment Upper Extremity Assessment: Generalized weakness   Lower Extremity Assessment Lower Extremity Assessment: Generalized weakness        Vision Baseline Vision/History: 1 Wears glasses Patient Visual Report: No change from baseline     Perception     Praxis      Cognition Arousal/Alertness: Awake/alert Behavior During Therapy:  WFL for tasks assessed/performed Overall Cognitive Status: History of cognitive impairments - at baseline                                 General Comments: Grossly oriented to date, able to follow all commands. Improved alertness and ability to recall  information.        Exercises      Shoulder Instructions       General Comments      Pertinent Vitals/ Pain       Pain Assessment Pain Assessment: Faces Faces Pain Scale: Hurts a little bit Pain Location: abdomen Pain Descriptors / Indicators: Discomfort, Grimacing Pain Intervention(s): Monitored during session, Repositioned, Patient requesting pain meds-RN notified  Home Living                                          Prior Functioning/Environment              Frequency  Min 2X/week        Progress Toward Goals  OT Goals(current goals can now be found in the care plan section)  Progress towards OT goals: Progressing toward goals  Acute Rehab OT Goals Patient Stated Goal: reduce pain, get stronger OT Goal Formulation: With patient/family Time For Goal Achievement: 01/14/22 Potential to Achieve Goals: Good  Plan Discharge plan remains appropriate;Frequency remains appropriate    Co-evaluation                 AM-PAC OT "6 Clicks" Daily Activity     Outcome Measure   Help from another person eating meals?: A Little Help from another person taking care of personal grooming?: A Lot Help from another person toileting, which includes using toliet, bedpan, or urinal?: A Lot Help from another person bathing (including washing, rinsing, drying)?: A Lot Help from another person to put on and taking off regular upper body clothing?: A Lot Help from another person to put on and taking off regular lower body clothing?: A Lot 6 Click Score: 13    End of Session Equipment Utilized During Treatment: Gait belt;Rolling walker (2 wheels)  OT Visit Diagnosis: Other abnormalities of gait and mobility (R26.89);Muscle weakness (generalized) (M62.81);Pain   Activity Tolerance Patient tolerated treatment well   Patient Left in chair;with call bell/phone within reach;with chair alarm set   Nurse Communication Mobility status        Time:  2080-2233 OT Time Calculation (min): 21 min  Charges: OT General Charges $OT Visit: 1 Visit OT Treatments $Self Care/Home Management : 8-22 mins   [facility] MS, OTR/L ascom [phone]  01/03/22, 1:17 PM

## 2022-01-03 NOTE — TOC Transition Note (Signed)
Transition of Care [facility]) - CM/SW Discharge Note   Patient Details  Name: Morgan Dixon MRN: [id number] Date of Birth: 1950/11/08  Transition of Care [facility]) CM/SW Contact:  Beverly Sessions, RN Phone Number: 01/03/2022, 12:07 PM   Clinical Narrative:      Patient will DC EQ:ASTM  Anticipated DC date: 01/03/22  Family notified: Sister and Husband Transport [id number]  Per MD patient ready for DC to . RN, patient, patient's family, and facility notified of DC. Discharge Summary sent to facility. RN given number for report. DC packet on chart. Ambulance transport requested for patient.  TOC signing off.  Isaias Cowman [facility] [phone]        Patient Goals and CMS Choice        Discharge Placement                       Discharge Plan and Services                                     Social Determinants of Health (SDOH) Interventions Food Insecurity Interventions: Intervention Not Indicated Housing Interventions: Intervention Not Indicated Transportation Interventions: Intervention Not Indicated Utilities Interventions: Intervention Not Indicated   Readmission Risk Interventions    08/19/2020    2:01 PM  Readmission Risk Prevention Plan  Medication Screening Complete  Transportation Screening Complete

## 2022-01-03 NOTE — Progress Notes (Addendum)
Pt has a Lactated Ringer continously running but was not present on assessment. NP  Foust made aware. Will continue to monitor.   Update 0027: NP Foust ordered to stopped at this time. Will continue to monitor.

## 2022-01-03 NOTE — Care Management Important Message (Signed)
Important Message  Patient Details  Name: Morgan Dixon MRN: [id number] Date of Birth: [birth year]   Medicare Important Message Given:  Yes     [name] 01/03/2022, 11:25 AM

## 2022-01-03 NOTE — Progress Notes (Signed)
[place] [facility] Day(s): 5.   Post op day(s): 4 Days Post-Op.   Interval History:  Patient seen and examined No acute events or new complaints overnight.  Patient looks best I have seen; much more alert Abdominal soreness but improved No fever, chills, nausea, emesis Labs are reassuring; hypokalemia resolved; now 3.6 Started on FLD; tolerating; wanting more No reported bowel function   Vital signs in last 24 hours: [min-max] current  Temp:  [97.7 F (36.5 C)-98.8 F (37.1 C)] 98.6 F (37 C) (09/20 0455) Pulse Rate:  [70-73] 71 (09/20 0455) Resp:  [17-19] 18 (09/20 0455) BP: (138-159)/(61-73) 158/64 (09/20 0455) SpO2:  [87 %-95 %] 87 % (09/20 0455)     Height: '5\' 6"'$  (167.6 cm) Weight: 108.9 kg BMI (Calculated): 38.76   Intake/Output last 2 shifts:  09/19 0701 - 09/20 0700 In: 2814 [P.O.:480; I.V.:2334] Out: 750 [Urine:750]   Physical Exam:  Constitutional: alert, cooperative, appears uncomfortable Respiratory: breathing non-labored at rest  Cardiovascular: regular rate and sinus rhythm  Gastrointestinal: soft, abdominal soreness markedly improved non-distended, no rebound/guarding. Abdominal binder in place Integumentary: Laparoscopic incisions are CDI with dermabond, no erythema or drainage   Labs:     Latest Ref Rng & Units 01/03/2022    5:08 AM 01/02/2022    6:44 AM 01/01/2022    5:22 AM  CBC  WBC 4.0 - 10.5 K/uL 4.6  7.0  9.5   Hemoglobin 12.0 - 15.0 g/dL 10.1  10.6  11.0   Hematocrit 36.0 - 46.0 % 32.8  33.9  35.3   Platelets 150 - 400 K/uL 157  165  169       Latest Ref Rng & Units 01/03/2022    5:08 AM 01/02/2022    6:44 AM 01/01/2022    5:22 AM  CMP  Glucose 70 - 99 mg/dL 102  122  108   BUN 8 - 23 mg/dL '13  12  10   '$ Creatinine 0.44 - 1.00 mg/dL 0.58  0.61  0.67   Sodium 135 - 145 mmol/L 141  139  137   Potassium 3.5 - 5.1 mmol/L 3.6  3.4  3.6   Chloride 98 - 111 mmol/L 106  103  102   CO2 22 - 32 mmol/L '28   29  28   '$ Calcium 8.9 - 10.3 mg/dL 8.5  8.6  8.4      Imaging studies: No new pertinent imaging studies   Assessment/Plan:  71 y.o. female 4 Days Post-Op s/p robotic assisted laparoscopic repair of incarcerated abdominal hernia (3-10 cm).    - Will advance to soft diet  - Monitor abdominal examination; on-going bowel function   - Pain control prn; antiemetics prn - Out of bed; PT following; recommending SNF - Continue abdominal binder   - Discharge Planning; Doing better from surgical perspective, diet advancing. At this point, it appears that disposition plan is biggest barrier; awaiting SNF placement vs progression to [facility]  All of the above findings and recommendations were discussed with the patient, and the medical team, and all of patient's questions were answered to her expressed satisfaction.  -- Edison Simon, PA-C [facility] 01/03/2022, 7:39 AM M-F: 7am - 4pm

## 2022-01-03 NOTE — Progress Notes (Signed)
Physical Therapy Treatment Patient Details Name: Morgan Dixon MRN: [id number] DOB: 1950/10/11 Today's Date: 01/03/2022   History of Present Illness Morgan Dixon is a 71 y.o. female with medical history significant for Chronic pain on buprenorphine presented to the ED with a 2-day history of generalized abdominal pain with nausea. Found with small bowel obstruction with laparoscopy 9/16.    PT Comments    Pt moved with more consistent cadence with the walker and back in the room did manage to do ~25 ft w/o UEs/AD, though much slower and more guarded than with walker.  She was able to ambulate on room air but SpO2 was down in the mid 80s with circumambulation of the nurses' station - no excessive DOE but clearly fatigued.    Recommendations for follow up therapy are one component of a multi-disciplinary discharge planning process, led by the attending physician.  Recommendations may be updated based on patient status, additional functional criteria and insurance authorization.  Follow Up Recommendations  Skilled nursing-short term rehab (<3 hours/day) Can patient physically be transported by private vehicle: Yes   Assistance Recommended at Discharge Frequent or constant Supervision/Assistance  Patient can return home with the following Direct supervision/assist for medications management;Assist for transportation;Direct supervision/assist for financial management;A little help with walking and/or transfers;A little help with bathing/dressing/bathroom;Assistance with cooking/housework   Equipment Recommendations  Rolling walker (2 wheels)    Recommendations for Other Services       Precautions / Restrictions Precautions Precautions: Fall Precaution Comments: abdominal binder Restrictions Weight Bearing Restrictions: No     Mobility  Bed Mobility Overal bed mobility: Needs Assistance       Supine to sit: HOB elevated Sit to supine: Min assist   General bed mobility  comments: increased time (light assist to lift/scoot LEs back into the bed)    Transfers Overall transfer level: Needs assistance Equipment used: Rolling walker (2 wheels) Transfers: Bed to chair/wheelchair/BSC Sit to Stand: Min guard Stand pivot transfers: Supervision         General transfer comment: Pt was able to rise from various surfaces without direct assist, cuing for set up    Ambulation/Gait Ambulation/Gait assistance: Min guard Gait Distance (Feet): 200 Feet Assistive device: Rolling walker (2 wheels)         General Gait Details: Pt with slow but safe effort circumambulating the nurses station with guarded, less AD reliant effort today.  Similar to yesterday's walk, her HR remained stable t/o but O2 (on room air) did drop into the mid 80s, recovered to ~90% after 2-3 minutes of seated rest after the effort.   Stairs             Wheelchair Mobility    Modified Rankin (Stroke Patients Only)       Balance Overall balance assessment: Needs assistance Sitting-balance support: Feet supported, Single extremity supported Sitting balance-Leahy Scale: Good     Standing balance support: Bilateral upper extremity supported Standing balance-Leahy Scale: Fair                              Cognition Arousal/Alertness: Awake/alert Behavior During Therapy: WFL for tasks assessed/performed Overall Cognitive Status: History of cognitive impairments - at baseline                                          Exercises  General Comments General comments (skin integrity, edema, etc.): Pt c/o fatigue by the end of the walking effort, O2 in the mid 80s on room air - no excessive DOE but clearly with some fatigue.      Pertinent Vitals/Pain Pain Assessment Pain Assessment: 0-10 Pain Score: 4  Pain Location: abdomen    Home Living                          Prior Function            PT Goals (current goals can now be  found in the care plan section) Progress towards PT goals: Progressing toward goals    Frequency    Min 2X/week      PT Plan Current plan remains appropriate    Co-evaluation              AM-PAC PT "6 Clicks" Mobility   Outcome Measure  Help needed turning from your back to your side while in a flat bed without using bedrails?: A Little Help needed moving from lying on your back to sitting on the side of a flat bed without using bedrails?: A Little Help needed moving to and from a bed to a chair (including a wheelchair)?: A Lot Help needed standing up from a chair using your arms (e.g., wheelchair or bedside chair)?: A Lot Help needed to walk in hospital room?: A Lot Help needed climbing 3-5 steps with a railing? : A Lot 6 Click Score: 14    End of Session Equipment Utilized During Treatment: Gait belt Activity Tolerance: Patient limited by fatigue Patient left: with chair alarm set;with call bell/phone within reach   PT Visit Diagnosis: Pain;Muscle weakness (generalized) (M62.81);Other abnormalities of gait and mobility (R26.89) Pain - part of body:  (abdomen)     Time: 0998-3382 PT Time Calculation (min) (ACUTE ONLY): 17 min  Charges:  $Gait Training: 8-22 mins                     Kreg Shropshire, DPT 01/03/2022, 3:47 PM

## 2022-01-03 NOTE — Progress Notes (Signed)
  Progress Note   Patient: Morgan Dixon ZDG:[id number] DOB: [birth year] DOA: 12/28/2021     5 DOS: the patient was seen and examined on 01/03/2022   Brief hospital course: Taken from prior notes.   ROBERTTA HALFHILL is a 71 y.o. female with medical history significant for Chronic pain on buprenorphine presents to the ED with a 2-day history of generalized abdominal pain with nausea.  Denies diarrhea or dysuria, fever or chills.  Found to have ventral incarcerated hernia s/p robotic assisted laparoscopic repair with general surgery.  9/19: Patient remained stable and tolerating clear liquid diet.  No bowel movement yet.  Surgery is recommending mobilization. PT/OT recommending SNF. TOC was consulted  9/20: Patient continued to improve, passing flatus, no bowel movement yet.  Had a bed offer at [facility] authorization. Surgery advance diet to soft which she is tolerating well.       Assessment and Plan: * Partial small bowel obstruction (HCC) Secondary to incarcerated ventral hernia s/p robotic laparoscopic repair by general surgery.  Patient tolerated the procedure well. Abdominal pain improving.  Currently tolerating soft diet.  No bowel movement yet but passing flatus now. -Continue with pain management -Advance diet as tolerated -Mobilize patient -PT is recommending SNF  Ventral hernia with obstruction and without gangrene See above.  Chronic, continuous use of opioids Patient was on Suboxone at home which is being held as she is currently receiving opioids. -Continue with current pain management, we will restart home Suboxone on discharge  Benign essential HTN Blood pressure within goal. -Continue home losartan  CVA (cerebral vascular accident) ([place]) History of CVA -Continue home Lipitor -Home Plavix is on hold due to recent surgery  Obesity Estimated body mass index is 38.74 kg/m as calculated from the following:   Height as of this  encounter: '5\' 6"'$  (1.676 m).   Weight as of this encounter: 108.9 kg.   -Will complicate overall prognosis  Hypokalemia Resolved -Replete potassium as needed and monitor   Subjective: Patient was sitting in chair comfortably when seen today.  Tolerating soft diet and denies any pain.  Passing flatus, no bowel movement yet.  Physical Exam: Vitals:   01/02/22 1922 01/03/22 0455 01/03/22 0758 01/03/22 1643  BP: (!) 146/73 (!) 158/64  (!) 146/59  Pulse: 70 71 68 68  Resp: '18 18  17  '$ Temp: 98.8 F (37.1 C) 98.6 F (37 C)  (!) 97.5 F (36.4 C)  TempSrc:      SpO2: 95% (!) 87% 93% 96%  Weight:      Height:       General.  Obese lady, in no acute distress. Pulmonary.  Lungs clear bilaterally, normal respiratory effort. CV.  Regular rate and rhythm, no JVD, rub or murmur. Abdomen.  Soft, nontender, nondistended, BS positive. CNS.  Alert and oriented .  No focal neurologic deficit. Extremities.  No edema, no cyanosis, pulses intact and symmetrical. Psychiatry.  Judgment and insight appears normal.  Data Reviewed: Prior data reviewed  Family Communication: Daughter was updated  Disposition: Status is: Inpatient Remains inpatient appropriate because: Severity of illness   Planned Discharge Destination: Skilled nursing facility  Time spent: 42 minutes  This record has been created using Systems analyst. Errors have been sought and corrected,but may not always be located. Such creation errors do not reflect on the standard of care.  Author: Lorella Nimrod, MD 01/03/2022 5:50 PM  For on call review [email or web address].

## 2022-01-03 NOTE — Assessment & Plan Note (Signed)
Resolved -Replete potassium as needed and monitor

## 2022-01-03 NOTE — Assessment & Plan Note (Signed)
Secondary to incarcerated ventral hernia s/p robotic laparoscopic repair by general surgery.  Patient tolerated the procedure well. Abdominal pain improving.  Currently tolerating soft diet.  No bowel movement yet but passing flatus now. -Continue with pain management -Advance diet as tolerated -Mobilize patient -PT is recommending SNF

## 2022-01-03 NOTE — Assessment & Plan Note (Signed)
See above

## 2022-01-04 DIAGNOSIS — K56600 Partial intestinal obstruction, unspecified as to cause: Secondary | ICD-10-CM | POA: Diagnosis not present

## 2022-01-04 LAB — BASIC METABOLIC PANEL
Anion gap: 5 (ref 5–15)
BUN: 11 mg/dL (ref 8–23)
CO2: 30 mmol/L (ref 22–32)
Calcium: 8.6 mg/dL — ABNORMAL LOW (ref 8.9–10.3)
Chloride: 107 mmol/L (ref 98–111)
Creatinine, Ser: 0.52 mg/dL (ref 0.44–1.00)
GFR, Estimated: >60 mL/min (ref >60)
Glucose, Bld: 99 mg/dL (ref 70–99)
Potassium: 3.9 mmol/L (ref 3.5–5.1)
Sodium: 142 mmol/L (ref 135–145)

## 2022-01-04 MED ORDER — LOSARTAN POTASSIUM 50 MG PO TABS: 50.0000 mg | ORAL_TABLET | Freq: Every day | ORAL | Status: DC

## 2022-01-04 NOTE — Assessment & Plan Note (Signed)
Secondary to incarcerated ventral hernia s/p robotic laparoscopic repair by general surgery.  Patient tolerated the procedure well. Abdominal pain improving.  Currently tolerating soft diet.  Had a bowel movement and passing flatus. -Mobilize patient -PT is recommending SNF-had a bed offer, awaiting insurance authorization.

## 2022-01-04 NOTE — Assessment & Plan Note (Signed)
See above

## 2022-01-04 NOTE — Progress Notes (Signed)
Progress Note   Patient: Morgan Dixon BJY:[id number] DOB: 01-Oct-1950 DOA: 12/28/2021     6 DOS: the patient was seen and examined on 01/04/2022   Brief hospital course: Taken from prior notes.   SHANEE BATCH is a 71 y.o. female with medical history significant for Chronic pain on buprenorphine presents to the ED with a 2-day history of generalized abdominal pain with nausea.  Denies diarrhea or dysuria, fever or chills.  Found to have ventral incarcerated hernia s/p robotic assisted laparoscopic repair with general surgery.  9/19: Patient remained stable and tolerating clear liquid diet.  No bowel movement yet.  Surgery is recommending mobilization. PT/OT recommending SNF. TOC was consulted  9/20: Patient continued to improve, passing flatus, no bowel movement yet.  Had a bed offer at [facility] authorization. Surgery advance diet to soft which she is tolerating well.  9/21: Patient remained stable.  Eating and drinking without any difficulty.  Had a bowel movement.  Awaiting insurance authorization for SNF.   Assessment and Plan: * Partial small bowel obstruction (HCC) Secondary to incarcerated ventral hernia s/p robotic laparoscopic repair by general surgery.  Patient tolerated the procedure well. Abdominal pain improving.  Currently tolerating soft diet.  Had a bowel movement and passing flatus. -Mobilize patient -PT is recommending SNF-had a bed offer, awaiting insurance authorization.  Ventral hernia with obstruction and without gangrene See above.  Chronic, continuous use of opioids Patient was on Suboxone at home which is being held as she is currently receiving opioids. -Continue with current pain management, we will restart home Suboxone on discharge  Benign essential HTN Blood pressure elevated today -Increasing the dose of losartan to 50 mg daily.  CVA (cerebral vascular accident) ([place]) History of CVA -Continue home Lipitor -Home Plavix  is on hold due to recent surgery  Obesity Estimated body mass index is 38.74 kg/m as calculated from the following:   Height as of this encounter: '5\' 6"'$  (1.676 m).   Weight as of this encounter: 108.9 kg.   -Will complicate overall prognosis  Hypokalemia Resolved -Replete potassium as needed and monitor   Subjective: Patient was feeling much improved when seen today.  Eating and drinking without any difficulty.  Had a bowel movement and passing flatus.  Physical Exam: Vitals:   01/03/22 0455 01/03/22 0758 01/03/22 1643 01/03/22 1929  BP: (!) 158/64  (!) 146/59 (!) 151/65  Pulse: 71 68 68 70  Resp: '18  17 20  '$ Temp: 98.6 F (37 C)  (!) 97.5 F (36.4 C) 98.6 F (37 C)  TempSrc:    Oral  SpO2: (!) 87% 93% 96% 93%  Weight:      Height:       General.  Obese elderly lady, in no acute distress. Pulmonary.  Lungs clear bilaterally, normal respiratory effort. CV.  Regular rate and rhythm, no JVD, rub or murmur. Abdomen.  Soft, nontender, nondistended, BS positive. CNS.  Alert and oriented .  No focal neurologic deficit. Extremities.  Bilateral LE lymphedema obvious sign of venous congestion. Psychiatry.  Judgment and insight appears normal.  Data Reviewed: Prior data reviewed  Family Communication: Talked with daughter on phone.  Disposition: Status is: Inpatient Remains inpatient appropriate because: Waiting for insurance authorization.   Planned Discharge Destination: Skilled nursing facility  Time spent: 38 minutes  This record has been created using Systems analyst. Errors have been sought and corrected,but may not always be located. Such creation errors do not reflect on the standard  of care.  Author: Lorella Nimrod, MD 01/04/2022 2:48 PM  For on call review [email or web address].

## 2022-01-04 NOTE — TOC Progression Note (Signed)
Transition of Care [facility]) - Progression Note    Patient Details  Name: Morgan Dixon MRN: [id number] Date of Birth: 1951-03-28  Transition of Care [facility]) CM/SW Contact  Beverly Sessions, RN Phone Number: 01/04/2022, 2:29 PM  Clinical Narrative:      Insurance auth pending       Expected Discharge Plan and Services                                                 Social Determinants of Health (SDOH) Interventions Food Insecurity Interventions: Intervention Not Indicated Housing Interventions: Intervention Not Indicated Transportation Interventions: Intervention Not Indicated Utilities Interventions: Intervention Not Indicated  Readmission Risk Interventions    08/19/2020    2:01 PM  Readmission Risk Prevention Plan  Medication Screening Complete  Transportation Screening Complete

## 2022-01-04 NOTE — Progress Notes (Signed)
PT Cancellation Note  Patient Details Name: Morgan Dixon MRN: [id number] DOB: 12/23/1950   Cancelled Treatment:    Reason Eval/Treat Not Completed: Other (comment) Entered pt's room attempting PT ~1700,Pt pleasantly refused. Reports she had done a loop with mobility tech earlier this afternoon and did not feel up to doing any more activity at this point today.    Kreg Shropshire, DPT 01/04/2022, 5:08 PM

## 2022-01-04 NOTE — Progress Notes (Signed)
Mobility Specialist - Progress Note   01/04/22 1505  Mobility  Activity Ambulated with assistance in hallway;Stood at bedside;Dangled on edge of bed  Level of Assistance Standby assist, set-up cues, supervision of patient - no hands on  Assistive Device Front wheel walker  Distance Ambulated (ft) 160 ft  Activity Response Tolerated well  $Mobility charge 1 Mobility   Pt supine in bed on RA upon arrival. Pt STS and ambulates 1 lap around NS SBA. (No hands on) No complaints. PT returns to bed with needs in reach.  Morgan Dixon  Mobility Specialist  01/04/22 3:06 PM

## 2022-01-04 NOTE — Assessment & Plan Note (Signed)
Blood pressure elevated today -Increasing the dose of losartan to 50 mg daily.

## 2022-01-05 DIAGNOSIS — K56600 Partial intestinal obstruction, unspecified as to cause: Secondary | ICD-10-CM | POA: Diagnosis not present

## 2022-01-05 MED ORDER — LOSARTAN POTASSIUM 50 MG PO TABS
100.0000 mg | ORAL_TABLET | Freq: Every day | ORAL | Status: DC
  Administered 2022-01-05 – 2022-01-06 (×2): 100 mg via ORAL
  Filled 2022-01-05 (×2): qty 2

## 2022-01-05 MED ORDER — CHLORTHALIDONE 25 MG PO TABS
25.0000 mg | ORAL_TABLET | Freq: Every day | ORAL | Status: DC
  Administered 2022-01-05 – 2022-01-06 (×2): 25 mg via ORAL
  Filled 2022-01-05 (×3): qty 1

## 2022-01-05 NOTE — TOC Progression Note (Signed)
Transition of Care [facility]) - Progression Note    Patient Details  Name: Morgan Dixon MRN: [id number] Date of Birth: 07-17-1950  Transition of Care [facility]) CM/SW Contact  Beverly Sessions, RN Phone Number: 01/05/2022, 12:52 PM  Clinical Narrative:     Notified by Hilda Blades at [facility] has denied SNF They are offering a Peer to Peer that has to be completed by 29 today MD notified        Expected Discharge Plan and Services                                                 Social Determinants of Health (SDOH) Interventions Food Insecurity Interventions: Intervention Not Indicated Housing Interventions: Intervention Not Indicated Transportation Interventions: Intervention Not Indicated Utilities Interventions: Intervention Not Indicated  Readmission Risk Interventions    08/19/2020    2:01 PM  Readmission Risk Prevention Plan  Medication Screening Complete  Transportation Screening Complete

## 2022-01-05 NOTE — TOC Progression Note (Addendum)
Transition of Care [facility]) - Progression Note    Patient Details  Name: Morgan Dixon MRN: [id number] Date of Birth: 1950/12/24  Transition of Care [facility]) CM/SW Contact  Beverly Sessions, RN Phone Number: 01/05/2022, 9:48 AM  Clinical Narrative:     Insurance auth pending  Vm left for daughter to update      Expected Discharge Plan and Services                                                 Social Determinants of Health (SDOH) Interventions Food Insecurity Interventions: Intervention Not Indicated Housing Interventions: Intervention Not Indicated Transportation Interventions: Intervention Not Indicated Utilities Interventions: Intervention Not Indicated  Readmission Risk Interventions    08/19/2020    2:01 PM  Readmission Risk Prevention Plan  Medication Screening Complete  Transportation Screening Complete

## 2022-01-05 NOTE — Progress Notes (Addendum)
PT Cancellation Note  Patient Details Name: BYANCA KASPER MRN: [id number] DOB: Dec 30, 1950   Cancelled Treatment:    Reason Eval/Treat Not Completed: Other (comment) Pt received in bed, declining participation in therapy 2/2 her stomach not feeling well. Will f/u as able.  1306: Re-attempted to see pt for PT as pt noted to have slight improvement in BP. Educated pt on limited PT over weekend. Pt politely declines at this time, requesting to finish lunch. Will f/u as able.  Lavone Nian, PT, DPT 01/05/22, 1:09 PM  Waunita Schooner 01/05/2022, 11:53 AM

## 2022-01-05 NOTE — Assessment & Plan Note (Signed)
Resolved -Replete potassium as needed and monitor

## 2022-01-05 NOTE — Progress Notes (Signed)
OT Cancellation Note  Patient Details Name: Morgan Dixon MRN: [id number] DOB: June 23, 1950   Cancelled Treatment:    Reason Eval/Treat Not Completed: Medical issues which prohibited therapy. Per chart review and conversation with RN, pt having hypertension this morning limiting pt's ability to actively participate in OT intervention. Most recent BP is 189/72 in supine.   Darleen Crocker, [place], OTR/L , CBIS ascom [phone]  01/05/22, 12:18 PM

## 2022-01-05 NOTE — Care Management Important Message (Signed)
Important Message  Patient Details  Name: Morgan Dixon MRN: [id number] Date of Birth: 1950-12-11   Medicare Important Message Given:  Yes     [name] 01/05/2022, 11:28 AM

## 2022-01-05 NOTE — Plan of Care (Signed)

## 2022-01-05 NOTE — TOC Progression Note (Signed)
Transition of Care [facility]) - Progression Note    Patient Details  Name: Morgan Dixon MRN: [id number] Date of Birth: 11-08-1950  Transition of Care [facility]) CM/SW Contact  Beverly Sessions, RN Phone Number: 01/05/2022, 4:43 PM  Clinical Narrative:     Received notice from MD that she has completed the peer to peer has been completed and they are going to deny I have not received the official denial daughter states that she does not intend to appeal the decision   Daughter states that she was able to ambulate with her mom 1.5 laps around the nurses station, and down to the elevated and back.  Per MD plan to dc tomorrow back to [facility].  Daughter in agreement.  Message left for [place] at [facility] to discuss.  Patient will need therapy services through [facility] Daughter to coordinate transport at discharge        Expected Discharge Plan and Services                                                 Social Determinants of Health (SDOH) Interventions Food Insecurity Interventions: Intervention Not Indicated Housing Interventions: Intervention Not Indicated Transportation Interventions: Intervention Not Indicated Utilities Interventions: Intervention Not Indicated  Readmission Risk Interventions    08/19/2020    2:01 PM  Readmission Risk Prevention Plan  Medication Screening Complete  Transportation Screening Complete

## 2022-01-05 NOTE — Progress Notes (Signed)
Progress Note   Patient: Morgan Dixon [id number]:[id number] DOB: Jun 18, 1950 DOA: 12/28/2021     7 DOS: the patient was seen and examined on 01/05/2022   Brief hospital course: Taken from prior notes.   Morgan Dixon is a 70 y.o. female with medical history significant for Chronic pain on buprenorphine presents to the ED with a 2-day history of generalized abdominal pain with nausea.  Denies diarrhea or dysuria, fever or chills.  Found to have ventral incarcerated hernia s/p robotic assisted laparoscopic repair with general surgery.  9/19: Patient remained stable and tolerating clear liquid diet.  No bowel movement yet.  Surgery is recommending mobilization. PT/OT recommending SNF. TOC was consulted  9/20: Patient continued to improve, passing flatus, no bowel movement yet.  Had a bed offer at [facility] authorization. Surgery advance diet to soft which she is tolerating well.  9/21: Patient remained stable.  Eating and drinking without any difficulty.  Had a bowel movement.  Awaiting insurance authorization for SNF.  9/22: Blood pressure elevated requiring using as needed.  Increasing the dose of home losartan to 100 mg daily. Might need to add another agent. Having some nausea this morning.  Had a bowel movement, decreased appetite secondary to nausea. Need peer to peer for SNF approval, initiated and waiting for the call back.   Assessment and Plan: * Partial small bowel obstruction (HCC) Secondary to incarcerated ventral hernia s/p robotic laparoscopic repair by general surgery.  Patient tolerated the procedure well. Abdominal pain improving.  Currently tolerating soft diet.  Had a bowel movement and passing flatus. -Mobilize patient -PT is recommending SNF-had a bed offer, insurance authorization needed.  To repair, call initiated and waiting for callback. Patient lives alone at independent living facility and need rehab before returning home and unfortunately  have [employer] as insurance.  Ventral hernia with obstruction and without gangrene See above.  Chronic, continuous use of opioids Patient was on Suboxone at home which is being held as she is currently receiving opioids. -Continue with current pain management, we will restart home Suboxone on discharge  Benign essential HTN Blood pressure elevated today -Increasing the dose of losartan to 50 mg daily.  CVA (cerebral vascular accident) ([place]) History of CVA -Continue home Lipitor -Home Plavix is on hold due to recent surgery  Obesity Estimated body mass index is 38.74 kg/m as calculated from the following:   Height as of this encounter: '5\' 6"'$  (1.676 m).   Weight as of this encounter: 108.9 kg.   -Will complicate overall prognosis  Hypokalemia Resolved -Replete potassium as needed and monitor   Subjective: Patient was feeling little nauseated with decreased appetite since this morning.  Had a bowel movement.  Denies any vomiting.  No pain  Physical Exam: Vitals:   01/05/22 0817 01/05/22 0926 01/05/22 1109 01/05/22 1251  BP: (!) 196/73 (!) 185/82 (!) 189/72 (!) 171/62  Pulse: 72 61 64 70  Resp: '20 16 16 16  '$ Temp: 98.9 F (37.2 C)     TempSrc:      SpO2: 97% 96% 97% 96%  Weight:      Height:       General.  Obese elderly lady, in no acute distress. Pulmonary.  Lungs clear bilaterally, normal respiratory effort. CV.  Regular rate and rhythm, no JVD, rub or murmur. Abdomen.  Soft, nontender, nondistended, BS positive. CNS.  Alert and oriented .  No focal neurologic deficit. Extremities.  No edema, no cyanosis, pulses intact and symmetrical. Psychiatry.  Judgment and insight appears normal.  Data Reviewed: Prior data reviewed.  Family Communication:   Disposition: Status is: Inpatient Remains inpatient appropriate because: Waiting for final insurance approval.   Planned Discharge Destination: Skilled nursing facility  Time spent: 45 minutes  This record has been  created using Systems analyst. Errors have been sought and corrected,but may not always be located. Such creation errors do not reflect on the standard of care.  Author: Lorella Nimrod, MD 01/05/2022 2:15 PM  For on call review [email or web address].

## 2022-01-05 NOTE — Assessment & Plan Note (Addendum)
Secondary to incarcerated ventral hernia s/p robotic laparoscopic repair by general surgery.  Patient tolerated the procedure well. Abdominal pain improving.  Currently tolerating soft diet.  Had a bowel movement and passing flatus. -Mobilize patient -PT is recommending SNF-had a bed offer, insurance authorization needed.  To repair, call initiated and waiting for callback. Patient lives alone at independent living facility and need rehab before returning home and unfortunately have [employer] as insurance.

## 2022-01-06 DIAGNOSIS — K56600 Partial intestinal obstruction, unspecified as to cause: Secondary | ICD-10-CM | POA: Diagnosis not present

## 2022-01-06 MED ORDER — LOSARTAN POTASSIUM 100 MG PO TABS: 100.0000 mg | ORAL_TABLET | Freq: Every day | ORAL | 1 refills | Status: DC

## 2022-01-06 NOTE — Progress Notes (Addendum)
Talked with patient's daughter on phone who might be having a bad day, she was shouting at me stating that we are just pushing her mother out and she works and cannot take her to her doctor's appointment.  She knews that her insurance denied even with peer to peer review. Even after multiple explanations that your mother is stable, eating and drinking okay, able to walk with the help of walker, having bowel movement and no other problem she is stable to go home she keeps shouting and at the end stating that I am going to give you a shitty review.  Patient need to have a close follow-up with primary care provider and surgeon.

## 2022-01-06 NOTE — TOC Transition Note (Signed)
Transition of Care [facility]) - CM/SW Discharge Note   Patient Details  Name: TEYANNA THIELMAN MRN: [id number] Date of Birth: 09/06/1950  Transition of Care [facility]) CM/SW Contact:  Valente David, RN Phone Number: 01/06/2022, 1:48 PM   Clinical Narrative:     Patient is ready for discharge, will need HHPT/OT services.  Spoke with daughter, confirmed that she will pick patient up and transport her back home [facility] for independent living.  Call placed to [facility] to make them aware that she will be discharging, message also left for [facility] that patient will need home health services.    Final next level of care: [place] Barriers to Discharge: Barriers Resolved   Patient Goals and CMS Choice Patient states their goals for this hospitalization and ongoing recovery are:: Home, home health CMS Medicare.gov Compare Post Acute Care list provided to:: Patient Represenative (must comment) (daughter [place]) Choice offered to / list presented to : Adult Children  Discharge Placement                       Discharge Plan and Services                            HH Agency:  Secondary school teacher at [facility]) Date [place]: 01/06/22 Time [place]: 1347 Representative spoke with at [place]: Message left for [employer] Determinants of Health (SDOH) Interventions Food Insecurity Interventions: Intervention Not Indicated Housing Interventions: Intervention Not Indicated Transportation Interventions: Intervention Not Indicated Utilities Interventions: Intervention Not Indicated   Readmission Risk Interventions    08/19/2020    2:01 PM  Readmission Risk Prevention Plan  Medication Screening Complete  Transportation Screening Complete

## 2022-01-06 NOTE — Discharge Summary (Signed)
Physician Discharge Summary   Patient: Morgan Dixon MRN: [id number] DOB: February 13, 1951  Admit date:     12/28/2021  Discharge date: 01/06/22  Discharge Physician: Lorella Nimrod   PCP: Alfonse Flavors, MD   Recommendations at discharge:  Follow-up with general surgery in 1 to 2 weeks Follow-up with primary care provider  Discharge Diagnoses: Principal Problem:   Partial small bowel obstruction (HCC) Active Problems:   Ventral hernia with obstruction and without gangrene   Chronic, continuous use of opioids   Benign essential HTN   CVA (cerebral vascular accident) ([place])   Obesity   Hypokalemia   Hospital Course: Taken from prior notes.   Morgan Dixon is a 71 y.o. female with medical history significant for Chronic pain on buprenorphine presents to the ED with a 2-day history of generalized abdominal pain with nausea.  Denies diarrhea or dysuria, fever or chills.  Found to have ventral incarcerated hernia s/p robotic assisted laparoscopic repair with general surgery.  9/19: Patient remained stable and tolerating clear liquid diet.  No bowel movement yet.  Surgery is recommending mobilization. PT/OT recommending SNF. TOC was consulted  9/20: Patient continued to improve, passing flatus, no bowel movement yet.  Had a bed offer at [facility] authorization. Surgery advance diet to soft which she is tolerating well.  9/21: Patient remained stable.  Eating and drinking without any difficulty.  Had a bowel movement.  Awaiting insurance authorization for SNF.  9/22: Blood pressure elevated requiring using as needed.  Increasing the dose of home losartan to 100 mg daily. Might need to add another agent. Having some nausea this morning.  Had a bowel movement, decreased appetite secondary to nausea. Need peer to peer for SNF approval, initiated and waiting for the call back, it was declined stating that she can walk 150 feet with walker.  Daughter does not  want to appeal.  9/23: Patient continued to improve.  Able to walk short distance with walker.  Having bowel movement and eating without any difficulty.  She is being discharged back to his independent living with home health services. We increased the dose of losartan for better control of her blood pressure.  Home health services ordered and should be provided by her facility.  She will continue with the rest of her home medications and need to have a close follow-up with her providers for further recommendations.  Assessment and Plan: * Partial small bowel obstruction (HCC) Secondary to incarcerated ventral hernia s/p robotic laparoscopic repair by general surgery.  Patient tolerated the procedure well. Abdominal pain improving.  Currently tolerating soft diet.  Had a bowel movement and passing flatus. -Mobilize patient -PT is recommending SNF-had a bed offer, insurance authorization needed.  To repair, call initiated and waiting for callback. Patient lives alone at independent living facility and need rehab before returning home and unfortunately have [employer] as insurance.  Ventral hernia with obstruction and without gangrene See above.  Chronic, continuous use of opioids Patient was on Suboxone at home which is being held as she is currently receiving opioids. -Continue with current pain management, we will restart home Suboxone on discharge  Benign essential HTN Blood pressure elevated today -Increasing the dose of losartan to 50 mg daily.  CVA (cerebral vascular accident) ([place]) History of CVA -Continue home Lipitor -Home Plavix is on hold due to recent surgery  Obesity Estimated body mass index is 38.74 kg/m as calculated from the following:   Height as of this encounter: '5\' 6"'$  (  1.676 m).   Weight as of this encounter: 108.9 kg.   -Will complicate overall prognosis  Hypokalemia Resolved -Replete potassium as needed and monitor   Consultants: General  surgery Procedures performed: Laparoscopic repair of incarcerated hernia Disposition: Home health Diet recommendation:  Discharge Diet Orders (From admission, onward)     Start     Ordered   01/06/22 0000  Diet - low sodium heart healthy        01/06/22 1253           Regular diet DISCHARGE MEDICATION: Allergies as of 01/06/2022       Reactions   Bupropion Nausea And Vomiting   Diazepam Rash   Penicillins Itching   Rofecoxib Swelling   Latex Itching   When used gloves in the past   Prozac [fluoxetine Hcl] Itching, Rash   Zinc Oxide [mexsana] Rash        Medication List     TAKE these medications    acetaminophen 500 MG tablet Commonly known as: TYLENOL Take 2 tablets (1,000 mg total) by mouth every 6 (six) hours as needed for moderate pain.   atorvastatin 40 MG tablet Commonly known as: LIPITOR Take 1 tablet (40 mg total) by mouth daily.   buprenorphine 8 MG Subl SL tablet Commonly known as: SUBUTEX Place 8 mg under the tongue 2 (two) times daily.   clopidogrel 75 MG tablet Commonly known as: PLAVIX Take 75 mg by mouth daily.   hydrochlorothiazide 25 MG tablet Commonly known as: HYDRODIURIL Take 25 mg by mouth daily.   losartan 100 MG tablet Commonly known as: COZAAR Take 1 tablet (100 mg total) by mouth daily. Start taking on: January 07, 2022 What changed:  medication strength how much to take   pregabalin 100 MG capsule Commonly known as: LYRICA Take 200 mg by mouth daily.   sertraline 100 MG tablet Commonly known as: ZOLOFT Take 100 mg by mouth at bedtime.   tiZANidine 4 MG tablet Commonly known as: ZANAFLEX Take 4 mg by mouth 2 (two) times daily.               Discharge Care Instructions  (From admission, onward)           Start     Ordered   01/06/22 0000  No dressing needed        01/06/22 1253            Contact information for follow-up providers     Ronny Bacon, MD Follow up in 3 week(s).    Specialty: General Surgery Why: s/p robotic assisted laparoscopic incisional hernia repair Contact information: [address] [address] [phone]         Zhou-Talbert, [name], MD. Schedule an appointment as soon as possible for a visit in 1 week(s).   Specialty: Family Medicine Contact information: [address] [place] [address] [phone]         Vickie Epley, MD .   Specialties: Cardiology, Radiology Contact information: [place] [address] [phone]              Contact information for after-discharge care     Destination     [facility] .   Service: Skilled Nursing Contact information: [address] [place] [phone]                    Discharge Exam:  Filed Weights   12/28/21 2135  Weight: 108.9 kg   General.  Obese elderly lady, in no acute distress. Pulmonary.  Lungs clear bilaterally, normal respiratory effort. CV.  Regular rate and rhythm, no JVD, rub or murmur. Abdomen.  Soft, nontender, nondistended, BS positive. CNS.  Alert and oriented .  No focal neurologic deficit. Extremities.  Bilateral LE lymphedema for sign of chronic venous congestion. Psychiatry.  Judgment and insight appears normal.   Condition at discharge: stable  The results of significant diagnostics from this hospitalization (including imaging, microbiology, ancillary and laboratory) are listed below for reference.   Imaging Studies: CT ABDOMEN PELVIS W CONTRAST  Result Date: 12/30/2021 CLINICAL DATA:  Epigastric pain. EXAM: CT ABDOMEN AND PELVIS WITH CONTRAST TECHNIQUE: Multidetector CT imaging of the abdomen and pelvis was performed using the standard protocol following bolus administration of intravenous contrast. RADIATION DOSE REDUCTION: This exam was performed according to the departmental dose-optimization program which  includes automated exposure control, adjustment of the mA and/or kV according to patient size and/or use of iterative reconstruction technique. CONTRAST:  196m OMNIPAQUE IOHEXOL 300 MG/ML  SOLN COMPARISON:  CT abdomen pelvis dated 12/29/2021. FINDINGS: Lower chest: There is trace pleural fluid bilaterally with mild associated atelectasis. Hepatobiliary: No focal liver abnormality is seen. Status post cholecystectomy. No biliary dilatation. Pancreas: Atrophic. No pancreatic ductal dilatation or surrounding inflammatory changes. Spleen: The spleen measures 13 cm in craniocaudal dimension, similar to prior exams. No focal abnormality. Adrenals/Urinary Tract: Adrenal glands are unremarkable. Kidneys are normal, without renal calculi, focal lesion, or hydronephrosis. Bladder is unremarkable. Stomach/Bowel: The patient is status post gastric bypass. There is a small hiatal hernia, unchanged. There is colonic diverticulosis without evidence of diverticulitis. No pericecal inflammatory changes are noted to suggest acute appendicitis. There is a ventral abdominal wall hernia to the right of midline and superior to the umbilicus containing a loop of small bowel which results in a small bowel obstruction. The degree of small bowel distension appears similar to prior exam. There is a small amount of fluid within the hernia sac, concerning for strangulation. Vascular/Lymphatic: Aortic atherosclerosis. No enlarged abdominal or pelvic lymph nodes. Reproductive: Status post hysterectomy. No adnexal masses. Other: No free intraperitoneal fluid. An abandoned spinal cord stimulator lead is noted. Musculoskeletal: Degenerative changes are seen in the spine. IMPRESSION: Ventral abdominal wall hernia containing a loop of small bowel resulting in small bowel obstruction. Fluid within the hernia sac is concerning for bowel strangulation. Electronically Signed   By: TZerita BoersM.D.   On: 12/30/2021 14:55   CT ABDOMEN PELVIS W  CONTRAST  Result Date: 12/29/2021 CLINICAL DATA:  Abdominal pain for 2 days. EXAM: CT ABDOMEN AND PELVIS WITH CONTRAST TECHNIQUE: Multidetector CT imaging of the abdomen and pelvis was performed using the standard protocol following bolus administration of intravenous contrast. RADIATION DOSE REDUCTION: This exam was performed according to the departmental dose-optimization program which includes automated exposure control, adjustment of the mA and/or kV according to patient size and/or use of iterative reconstruction technique. CONTRAST:  1056mOMNIPAQUE IOHEXOL 300 MG/ML  SOLN COMPARISON:  Aug 17, 2020 FINDINGS: Lower chest: No acute abnormality. Hepatobiliary: No focal liver abnormality is seen. Status post cholecystectomy. No biliary dilatation. Pancreas: Unremarkable. No pancreatic ductal dilatation or surrounding inflammatory changes. Spleen: Punctate calcified granulomas are seen within the mildly enlarged spleen. Adrenals/Urinary Tract: Adrenal glands are unremarkable. Kidneys are normal in size, without renal calculi or hydronephrosis. Small bilateral simple renal cysts are noted. Bladder is unremarkable. Stomach/Bowel: There  is a small hiatal hernia. Multiple adjacent surgical clips and surgical sutures are seen. Surgical clips are also seen along the splenic hilum. The appendix is not clearly identified. Small bowel loops within the mid and lower abdomen approach the upper limit of normal caliber. A dilated small bowel loop (3.5 cm) is seen within the mid left abdomen (axial CT image 54, CT series 2). A transition zone is seen within the mid left abdomen (axial CT images 54 through 57, CT series 2). Mildly thickened small bowel loops are also seen within the mid and lower abdomen, at and distal to the transition zone. Noninflamed diverticula are seen within the sigmoid colon. Vascular/Lymphatic: No significant vascular findings are present. No enlarged abdominal or pelvic lymph nodes. Reproductive:  Status post hysterectomy. No adnexal masses. Other: A 6.1 cm x 2.8 cm ventral hernia is seen along the anterior aspect of the mid abdominal wall, to the right of midline. This contains fluid and a short segment of decompressed small bowel, and is increased in size when compared to the prior study. Musculoskeletal: Multilevel degenerative changes seen throughout the lumbar spine. IMPRESSION: 1. Moderate severity enteritis involving multiple loops of mid and distal small bowel, with additional findings consistent with a subsequent early partial small bowel obstruction. 2. Evidence of prior gastric bypass surgery, prior cholecystectomy and prior hysterectomy. 3. Ventral hernia, as described above. 4. Sigmoid diverticulosis. 5. Small bilateral simple renal cysts. No additional follow-up or imaging is recommended. This recommendation follows ACR consensus guidelines: Management of the Incidental Renal Mass on CT: A White Paper of the ACR Incidental Findings Committee. J Am Coll Radiol [phone]. Electronically Signed   By: Virgina Norfolk M.D.   On: 12/29/2021 01:00   DG Hip Unilat  With Pelvis 2-3 Views Right  Result Date: 12/15/2021 CLINICAL DATA:  Right hip pain EXAM: DG HIP (WITH OR WITHOUT PELVIS) 2-3V RIGHT COMPARISON:  None Available. FINDINGS: Normal alignment. No acute fracture or dislocation. Mild bilateral degenerative hip arthritis. Changes of degenerative disc disease are noted at the lumbosacral junction, not well profiled on this examination. Soft tissues are unremarkable. IMPRESSION: Mild bilateral degenerative hip arthritis. Electronically Signed   By: Fidela Salisbury M.D.   On: 12/15/2021 20:42    Microbiology: Results for orders placed or performed during the hospital encounter of 12/28/21  Urine Culture     Status: Abnormal   Collection Time: 12/28/21  9:40 PM   Specimen: Urine, Random  Result Value Ref Range Status   Specimen Description   Final    URINE, RANDOM Performed at  [facility], [address] [place], [place] [address]    Special Requests   Final    NONE Performed at [facility], [address] [place], [place] [address]    Culture (A)  Final    <10,000 COLONIES/mL INSIGNIFICANT GROWTH Performed at [facility], [place] [address] [place], [place] [address]    Report Status 12/30/2021 FINAL  Final  Surgical pcr screen     Status: None   Collection Time: 12/30/21  3:50 PM   Specimen: Nasal Mucosa; Nasal Swab  Result Value Ref Range Status   MRSA, PCR NEGATIVE NEGATIVE Final   Staphylococcus aureus NEGATIVE NEGATIVE Final    Comment: (NOTE) The Xpert SA Assay (FDA approved for NASAL specimens in patients 64 years of age and older), is one component of a comprehensive surveillance program. It is not intended to diagnose infection nor to guide or monitor treatment. Performed  at [facility], [address] [place], [place] [address]     Labs: CBC: Recent Labs  Lab 12/31/21 [phone] 01/01/22 0522 01/02/22 0644 01/03/22 0508  WBC 5.6 9.5 7.0 4.6  HGB 10.0* 11.0* 10.6* 10.1*  HCT 32.6* 35.3* 33.9* 32.8*  MCV 86.9 87.8 86.9 87.0  PLT 154 169 165 970   Basic Metabolic Panel: Recent Labs  Lab 12/31/21 0629 01/01/22 0522 01/02/22 0644 01/03/22 0508 01/04/22 0429  NA 140 137 139 141 142  K 3.9 3.6 3.4* 3.6 3.9  CL 105 102 103 106 107  CO2 '24 28 29 28 30  '$ GLUCOSE 121* 108* 122* 102* 99  BUN '13 10 12 13 11  '$ CREATININE 0.64 0.67 0.61 0.58 0.52  CALCIUM 8.6* 8.4* 8.6* 8.5* 8.6*   Liver Function Tests: No results for input(s): "AST", "ALT", "ALKPHOS", "BILITOT", "PROT", "ALBUMIN" in the last 168 hours. CBG: No results for input(s): "GLUCAP" in the last 168 hours.  Discharge time spent: greater than 30 minutes.  This record has been created using Systems analyst. Errors have been sought and corrected,but may not always be located. Such creation errors do not reflect on the  standard of care.   Signed: Lorella Nimrod, MD [facility] 01/06/2022

## 2022-01-23 ENCOUNTER — Encounter: Payer: Medicare HMO | Admitting: Surgery

## 2022-01-30 ENCOUNTER — Encounter: Payer: Self-pay | Admitting: Surgery

## 2022-01-30 ENCOUNTER — Ambulatory Visit (INDEPENDENT_AMBULATORY_CARE_PROVIDER_SITE_OTHER): Payer: Medicare HMO | Admitting: Surgery

## 2022-01-30 VITALS — BP 146/75 | HR 60 | Temp 98.3°F | Ht 66.0 in | Wt 254.6 lb

## 2022-01-30 DIAGNOSIS — Z8719 Personal history of other diseases of the digestive system: Secondary | ICD-10-CM | POA: Insufficient documentation

## 2022-01-30 DIAGNOSIS — Z09 Encounter for follow-up examination after completed treatment for conditions other than malignant neoplasm: Secondary | ICD-10-CM

## 2022-01-30 DIAGNOSIS — K430 Incisional hernia with obstruction, without gangrene: Secondary | ICD-10-CM

## 2022-01-30 NOTE — Patient Instructions (Signed)

## 2022-01-30 NOTE — Progress Notes (Signed)
[facility] POST-OP OFFICE VISIT  01/30/2022  HPI: Morgan Dixon is a 71 y.o. female 31 days s/p robotic assisted ventral hernia repair.  Her biggest complaints is some residual pain over the lowest left lower quadrant incision which I remember is right on her iliac crest.  Otherwise she denies any other issues with pain.  We discussed her mesh size and the Character of it.  She appears to be doing very well in terms of hernia pain.  Vital signs: BP (!) 146/75   Pulse 60   Temp 98.3 F (36.8 C) (Oral)   Ht '5\' 6"'$  (1.676 m)   Wt 254 lb 9.6 oz (115.5 kg)   SpO2 95%   BMI 41.09 kg/m    Physical Exam: Constitutional: She appears at her baseline, still somewhat cautious at arising from chair and transferring to exam table.  We examined her standing up. Abdomen: Obese, soft nontender.  Incisions appear to be healing well.   Assessment/Plan: This is a 71 y.o. female 31 days s/p robotic ventral hernia repair, utilizing 15 cm diameter Ventralex light ST mesh and IPOM placement.  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   Ronny Bacon M.D., FACS 01/30/2022, 1:41 PM

## 2022-03-19 ENCOUNTER — Other Ambulatory Visit: Payer: Self-pay

## 2022-03-19 ENCOUNTER — Emergency Department
Admission: EM | Admit: 2022-03-19 | Discharge: 2022-03-19 | Discharge status: Against Medical Advice | Payer: Medicare HMO | Attending: Emergency Medicine | Admitting: Emergency Medicine

## 2022-03-19 ENCOUNTER — Emergency Department: Payer: Medicare HMO

## 2022-03-19 DIAGNOSIS — Z5321 Procedure and treatment not carried out due to patient leaving prior to being seen by health care provider: Secondary | ICD-10-CM | POA: Diagnosis not present

## 2022-03-19 DIAGNOSIS — R109 Unspecified abdominal pain: Secondary | ICD-10-CM | POA: Diagnosis not present

## 2022-03-19 DIAGNOSIS — R112 Nausea with vomiting, unspecified: Secondary | ICD-10-CM | POA: Diagnosis present

## 2022-03-19 LAB — COMPREHENSIVE METABOLIC PANEL
ALT: 10 U/L (ref 0–44)
AST: 18 U/L (ref 15–41)
Albumin: 3.6 g/dL (ref 3.5–5.0)
Alkaline Phosphatase: 72 U/L (ref 38–126)
Anion gap: 7 (ref 5–15)
BUN: 12 mg/dL (ref 8–23)
CO2: 24 mmol/L (ref 22–32)
Calcium: 9.4 mg/dL (ref 8.9–10.3)
Chloride: 110 mmol/L (ref 98–111)
Creatinine, Ser: 0.81 mg/dL (ref 0.44–1.00)
GFR, Estimated: >60 mL/min (ref >60)
Glucose, Bld: 110 mg/dL — ABNORMAL HIGH (ref 70–99)
Potassium: 4 mmol/L (ref 3.5–5.1)
Sodium: 141 mmol/L (ref 135–145)
Total Bilirubin: 1.9 mg/dL — ABNORMAL HIGH (ref 0.3–1.2)
Total Protein: 7.4 g/dL (ref 6.5–8.1)

## 2022-03-19 LAB — CBC WITH DIFFERENTIAL/PLATELET
Abs Immature Granulocytes: 0.04 10*3/uL (ref 0.00–0.07)
Basophils Absolute: 0 10*3/uL (ref 0.0–0.1)
Basophils Relative: 1 %
Eosinophils Absolute: 0.1 10*3/uL (ref 0.0–0.5)
Eosinophils Relative: 1 %
HCT: 34 % — ABNORMAL LOW (ref 36.0–46.0)
Hemoglobin: 10.6 g/dL — ABNORMAL LOW (ref 12.0–15.0)
Immature Granulocytes: 1 %
Lymphocytes Relative: 32 %
Lymphs Abs: 1.4 10*3/uL (ref 0.7–4.0)
MCH: 27.5 pg (ref 26.0–34.0)
MCHC: 31.2 g/dL (ref 30.0–36.0)
MCV: 88.1 fL (ref 80.0–100.0)
Monocytes Absolute: 0.3 10*3/uL (ref 0.1–1.0)
Monocytes Relative: 6 %
Neutro Abs: 2.6 10*3/uL (ref 1.7–7.7)
Neutrophils Relative %: 59 %
Platelets: 170 10*3/uL (ref 150–400)
RBC: 3.86 MIL/uL — ABNORMAL LOW (ref 3.87–5.11)
RDW: 15.4 % (ref 11.5–15.5)
WBC: 4.3 10*3/uL (ref 4.0–10.5)
nRBC: 0 % (ref 0.0–0.2)

## 2022-03-19 LAB — LIPASE, BLOOD: Lipase: 27 U/L (ref 11–51)

## 2022-03-19 LAB — TROPONIN I (HIGH SENSITIVITY): Troponin I (High Sensitivity): 7 ng/L (ref <18)

## 2022-03-19 MED ORDER — FENTANYL CITRATE PF 50 MCG/ML IJ SOSY
50.0000 ug | PREFILLED_SYRINGE | Freq: Once | INTRAMUSCULAR | Status: AC
  Administered 2022-03-19: 50 ug via INTRAVENOUS
  Filled 2022-03-19: qty 1

## 2022-03-19 MED ORDER — IOHEXOL 300 MG/ML  SOLN
100.0000 mL | Freq: Once | INTRAMUSCULAR | Status: AC | PRN
  Administered 2022-03-19: 100 mL via INTRAVENOUS

## 2022-03-19 MED ORDER — ONDANSETRON HCL 4 MG/2ML IJ SOLN
4.0000 mg | Freq: Once | INTRAMUSCULAR | Status: AC
  Administered 2022-03-19: 4 mg via INTRAVENOUS
  Filled 2022-03-19: qty 2

## 2022-03-19 NOTE — ED Notes (Signed)
First Nurse note: Arrived via EMS from [facility] for nausea, vomiting and diarrhea since Saturday. History of small bowel obstruction. 117/75 HR 90 100%.

## 2022-03-19 NOTE — ED Notes (Signed)
Pt had de-stated O2 down to 88%, RN advise 2L of O2. States back to 100%

## 2022-03-19 NOTE — ED Triage Notes (Signed)
Patient reports history of small bowel obstructions. Reports abdominal pain, nausea and vomiting since  Saturday. AOX4. Resp even, unlabored on RA.

## 2022-03-19 NOTE — ED Notes (Signed)
Patient states she wishes to leave with out evaluation.  States she presented for c/o nausea.

## 2022-03-19 NOTE — ED Provider Triage Note (Addendum)
Emergency Medicine Provider Triage Evaluation Note  Morgan Dixon , a 71 y.o. female  was evaluated in triage.  Pt complains of nausea/vomiting, history of gastric bypass, history of SBO.  Review of Systems  Positive: Nausea/vomiting Negative: Chest pain  Physical Exam  BP (!) 163/66 (BP Location: Left Arm)   Pulse (!) 57   Temp 98.8 F (37.1 C) (Oral)   Resp 20   SpO2 98%  Gen:   Awake, mild distress   Resp:  Normal effort  MSK:   Moves extremities without difficulty  Other:  Abdomen tender to palpation.  Medical Decision Making  Medically screening exam initiated at 6:18 AM.  Appropriate orders placed.  Morgan Dixon was informed that the remainder of the evaluation will be completed by another provider, this initial triage assessment does not replace that evaluation, and the importance of remaining in the ED until their evaluation is complete.  71 year old female presenting with nausea/vomiting, history of SBO.  Will obtain basic lab work, CT abdomen/pelvis.  Of note, patient's initial EKG was done with her sitting up and heaving.  Does not represent true STEMI.  Repeat EKG performed but unable to be exported in the computer.  Repeat EKG demonstrates normal sinus rhythm, no ST elevation.   Morgan Blanch, MD 03/19/22 9311    Morgan Blanch, MD 03/19/22 [phone]

## 2022-04-02 ENCOUNTER — Encounter: Payer: Medicare HMO | Attending: Physician Assistant | Admitting: Physician Assistant

## 2022-04-02 DIAGNOSIS — I7389 Other specified peripheral vascular diseases: Secondary | ICD-10-CM | POA: Diagnosis not present

## 2022-04-02 DIAGNOSIS — Z8673 Personal history of transient ischemic attack (TIA), and cerebral infarction without residual deficits: Secondary | ICD-10-CM | POA: Insufficient documentation

## 2022-04-02 DIAGNOSIS — I87323 Chronic venous hypertension (idiopathic) with inflammation of bilateral lower extremity: Secondary | ICD-10-CM | POA: Insufficient documentation

## 2022-04-02 DIAGNOSIS — Z7901 Long term (current) use of anticoagulants: Secondary | ICD-10-CM | POA: Insufficient documentation

## 2022-04-02 DIAGNOSIS — I890 Lymphedema, not elsewhere classified: Secondary | ICD-10-CM | POA: Diagnosis not present

## 2022-04-02 DIAGNOSIS — Z7902 Long term (current) use of antithrombotics/antiplatelets: Secondary | ICD-10-CM | POA: Diagnosis not present

## 2022-04-02 DIAGNOSIS — I10 Essential (primary) hypertension: Secondary | ICD-10-CM | POA: Diagnosis not present

## 2022-04-03 NOTE — Progress Notes (Signed)
Morgan, [name] ([id number]) [id number] Page 1 of 8 Visit Report for 04/02/2022 Allergy List Details Patient Name: Date of Service: Morgan Dixon, Morgan NICE H. 04/02/2022 9:30 A M Medical Record Number: [id number] Patient Account Number: [id number] Date of Birth/Sex: Treating RN: Mar 10, 1951 (71 y.o. Female) Rosalio Loud Primary Care Emori Kamau: PA Haig Prophet, NO Other Clinician: Referring Herbert Aguinaldo: Treating Doneta Bayman/Extender: Selena Batten, RO NA LD Weeks in Treatment: 0 Allergies Active Allergies Penicillin Reaction: Rash Severity: Moderate Type: Medication Allergy Notes Electronic Signature(s) Signed: 04/02/2022 5:08:17 PM By: Rosalio Loud MSN RN CNS WTA Entered By: Rosalio Loud on 04/02/2022 10:05:54 -------------------------------------------------------------------------------- Arrival Information Details Patient Name: Date of Service: Morgan Dixon NICE H. 04/02/2022 9:30 A M Medical Record Number: [id number] Patient Account Number: [id number] Date of Birth/Sex: Treating RN: [birth year] (71 y.o. Female) Rosalio Loud Primary Care Karston Hyland: PA Haig Prophet, NO Other Clinician: Referring Yashica Sterbenz: Treating Wannetta Langland/Extender: Selena Batten, RO NA LD Weeks in Treatment: 0 Visit Information Patient Arrived: Ambulatory Arrival Time: 09:29 Accompanied By: self Transfer Assistance: None Patient Identification Verified: Yes Secondary Verification Process Completed: Yes Patient Requires Transmission-Based Precautions: No Patient Has Alerts: Yes Patient Alerts: Patient on Blood Thinner [name], [name] ([id number]) [id number] Page 2 of 8 Electronic Signature(s) Signed: 04/02/2022 5:08:17 PM By: Rosalio Loud MSN RN CNS WTA Entered By: Rosalio Loud on 04/02/2022 10:05:20 -------------------------------------------------------------------------------- Clinic Level of Care Assessment Details Patient Name: Date of  Service: [name], [name] NICE H. 04/02/2022 9:30 A M Medical Record Number: [id number] Patient Account Number: [id number] Date of Birth/Sex: Treating RN: 08-Jun-1950 (71 y.o. Female) Rosalio Loud Primary Care Idolina Mantell: PA Haig Prophet, NO Other Clinician: Referring Mahika Vanvoorhis: Treating Keyonte Cookston/Extender: Selena Batten, RO NA LD Weeks in Treatment: 0 Clinic Level of Care Assessment Items TOOL 2 Quantity Score X- 1 0 Use when only an EandM is performed on the INITIAL visit ASSESSMENTS - Nursing Assessment / Reassessment X- 1 20 General Physical Exam (combine w/ comprehensive assessment (listed just below) when performed on new pt. evals) X- 1 25 Comprehensive Assessment (HX, ROS, Risk Assessments, Wounds Hx, etc.) ASSESSMENTS - Wound and Skin A ssessment / Reassessment '[]'$  - 0 Simple Wound Assessment / Reassessment - one wound '[]'$  - 0 Complex Wound Assessment / Reassessment - multiple wounds '[]'$  - 0 Dermatologic / Skin Assessment (not related to wound area) ASSESSMENTS - Ostomy and/or Continence Assessment and Care '[]'$  - 0 Incontinence Assessment and Management '[]'$  - 0 Ostomy Care Assessment and Management (repouching, etc.) PROCESS - Coordination of Care '[]'$  - 0 Simple Patient / Family Education for ongoing care '[]'$  - 0 Complex (extensive) Patient / Family Education for ongoing care X- 1 10 Staff obtains Programmer, systems, Records, T Results / Process Orders est '[]'$  - 0 Staff telephones HHA, Nursing Homes / Clarify orders / etc '[]'$  - 0 Routine Transfer to another Facility (non-emergent condition) '[]'$  - 0 Routine Hospital Admission (non-emergent condition) X- 1 15 New Admissions / Biomedical engineer / Ordering NPWT Apligraf, etc. , '[]'$  - 0 Emergency Hospital Admission (emergent condition) X- 1 10 Simple Discharge Coordination '[]'$  - 0 Complex (extensive) Discharge Coordination PROCESS - Special Needs '[]'$  - 0 Pediatric / Minor Patient Management '[]'$  - 0 Isolation Patient Management '[]'$   - 0 Hearing / Language / Visual special needs '[]'$  - 0 Assessment of Community assistance (transportation, D/C planning, etc.) '[]'$  - 0 Additional assistance / Altered mentation [name], [name] ([id number]) [id number] Page 3 of 8 '[]'$  - 0 Support Surface(s) Assessment (bed, cushion, seat, etc.) INTERVENTIONS -  Wound Cleansing / Measurement X- 1 5 Wound Imaging (photographs - any number of wounds) '[]'$  - 0 Wound Tracing (instead of photographs) '[]'$  - 0 Simple Wound Measurement - one wound '[]'$  - 0 Complex Wound Measurement - multiple wounds '[]'$  - 0 Simple Wound Cleansing - one wound '[]'$  - 0 Complex Wound Cleansing - multiple wounds INTERVENTIONS - Wound Dressings '[]'$  - 0 Small Wound Dressing one or multiple wounds '[]'$  - 0 Medium Wound Dressing one or multiple wounds '[]'$  - 0 Large Wound Dressing one or multiple wounds '[]'$  - 0 Application of Medications - injection INTERVENTIONS - Miscellaneous '[]'$  - 0 External ear exam '[]'$  - 0 Specimen Collection (cultures, biopsies, blood, body fluids, etc.) '[]'$  - 0 Specimen(s) / Culture(s) sent or taken to Lab for analysis '[]'$  - 0 Patient Transfer (multiple staff / Civil Service fast streamer / Similar devices) '[]'$  - 0 Simple Staple / Suture removal (25 or less) '[]'$  - 0 Complex Staple / Suture removal (26 or more) '[]'$  - 0 Hypo / Hyperglycemic Management (close monitor of Blood Glucose) X- 1 15 Ankle / Brachial Index (ABI) - do not check if billed separately Has the patient been seen at the hospital within the last three years: Yes Total Score: 100 Level Of Care: New/Established - Level 3 Electronic Signature(s) Signed: 04/02/2022 5:08:17 PM By: Rosalio Loud MSN RN CNS WTA Entered By: Rosalio Loud on 04/02/2022 10:07:42 -------------------------------------------------------------------------------- Encounter Discharge Information Details Patient Name: Date of Service: Morgan Dixon NICE H. 04/02/2022 9:30 A M Medical Record Number:  [id number] Patient Account Number: [id number] Date of Birth/Sex: Treating RN: 1950/10/24 (71 y.o. Female) Rosalio Loud Primary Care Mikail Goostree: PA Haig Prophet, NO Other Clinician: Referring Jihan Mellette: Treating Kandace Elrod/Extender: Selena Batten, RO NA LD Weeks in Treatment: 0 Encounter Discharge Information Items Discharge Condition: Stable Ambulatory Status: Ambulatory Discharge Destination: Home Transportation: Private Auto Accompanied By: self Schedule Follow-up Appointment: No SALEENA, TAMAS ([id number]) [id number] Page 4 of 8 Clinical Summary of Care: Electronic Signature(s) Signed: 04/02/2022 5:08:17 PM By: Rosalio Loud MSN RN CNS WTA Entered By: Rosalio Loud on 04/02/2022 10:15:46 -------------------------------------------------------------------------------- Lower Extremity Assessment Details Patient Name: Date of Service: Morgan Dixon NICE H. 04/02/2022 9:30 A M Medical Record Number: [id number] Patient Account Number: [id number] Date of Birth/Sex: Treating RN: [birth year] (71 y.o. Female) Rosalio Loud Primary Care Yoltzin Ransom: PA TIENT, NO Other Clinician: Referring Dayna Alia: Treating Brielyn Bosak/Extender: Selena Batten, RO NA LD Weeks in Treatment: 0 Edema Assessment Assessed: [Left: Yes] [Right: No] Edema: [Left: Ye] [Right: s] Calf Left: Right: Point of Measurement: 33 cm From Medial Instep 59 cm Ankle Left: Right: Point of Measurement: 10 cm From Medial Instep 35.2 cm Vascular Assessment Pulses: Popliteal Doppler Audible: [Left:Yes] Blood Pressure: Brachial: [Left:170] Ankle: [Left:Dorsalis Pedis: 120 0.71] Electronic Signature(s) Signed: 04/02/2022 5:08:17 PM By: Rosalio Loud MSN RN CNS WTA Entered By: Rosalio Loud on 04/02/2022 10:06:27 -------------------------------------------------------------------------------- Multi Wound Chart Details Patient Name: Date of Service: Morgan Dixon NICE H. 04/02/2022 9:30 A  Stefano Gaul ([id number]) [id number] Page 5 of 8 Medical Record Number: [id number] Patient Account Number: [id number] Date of Birth/Sex: Treating RN: 02-18-1951 (71 y.o. Female) Rosalio Loud Primary Care Madsen Riddle: PA Haig Prophet, NO Other Clinician: Referring Abreanna Drawdy: Treating Emori Kamau/Extender: Selena Batten, RO NA LD Weeks in Treatment: 0 Vital Signs Height(in): 66 Pulse(bpm): 65 Weight(lbs): 240 Blood Pressure(mmHg): 180/87 Body Mass Index(BMI): 38.7 Temperature(F): 97.8 Respiratory Rate(breaths/min): 16 [1:Photos:] [N/A:N/A] Left, Lateral Lower Leg N/A N/A Wound Location: Gradually Appeared N/A N/A Wounding Event:  Venous Leg Ulcer N/A N/A Primary Etiology: Lymphedema N/A N/A Secondary Etiology: Hypertension N/A N/A Comorbid History: 10/23/2021 N/A N/A Date Acquired: 0 N/A N/A Weeks of Treatment: Open N/A N/A Wound Status: No N/A N/A Wound Recurrence: 0.3x0.2x0.41 N/A N/A Measurements L x W x D (cm) 0.047 N/A N/A A (cm) : rea 0.019 N/A N/A Volume (cm) : Partial Thickness N/A N/A Classification: None Present N/A N/A Exudate A mount: Medium (34-66%) N/A N/A Granulation A mount: Red N/A N/A Granulation Quality: None Present (0%) N/A N/A Necrotic A mount: Fascia: No N/A N/A Exposed Structures: Fat Layer (Subcutaneous Tissue): No Tendon: No Muscle: No Joint: No Bone: No Treatment Notes Electronic Signature(s) Signed: 04/02/2022 5:08:17 PM By: Rosalio Loud MSN RN CNS WTA Entered By: Rosalio Loud on 04/02/2022 10:04:58 Raynelle Chary ([id number]) [id number] Page 6 of 8 -------------------------------------------------------------------------------- Multi-Disciplinary Care Plan Details Patient Name: Date of Service: ALEAHA, FICKLING NICE H. 04/02/2022 9:30 A M Medical Record Number: [id number] Patient Account Number: [id number] Date of Birth/Sex: Treating RN: 21-Mar-1951 (71 y.o. Female)  Rosalio Loud Primary Care Kortny Lirette: PA Haig Prophet, NO Other Clinician: Referring Odin Mariani: Treating Kyre Jeffries/Extender: Selena Batten, RO NA LD Weeks in Treatment: 0 Active Inactive Electronic Signature(s) Signed: 04/02/2022 5:08:17 PM By: Rosalio Loud MSN RN CNS WTA Entered By: Rosalio Loud on 04/02/2022 10:08:26 -------------------------------------------------------------------------------- Non-Wound Condition Assessment Details Patient Name: Date of Service: Morgan Dixon NICE H. 04/02/2022 9:30 A M Medical Record Number: [id number] Patient Account Number: [id number] Date of Birth/Sex: Treating RN: 05/04/1950 (71 y.o. Female) Rosalio Loud Primary Care Anuel Sitter: PA Haig Prophet, NO Other Clinician: Referring Gordie Crumby: Treating Anirudh Baiz/Extender: Selena Batten, RO NA LD Weeks in Treatment: 0 Non-Wound Condition: Condition: Lymphedema Location: Leg Side: Left Photos Electronic Signature(s) Signed: 04/02/2022 5:08:17 PM By: Rosalio Loud MSN RN CNS [address], [place] ([id number]) [id number] Page 7 of 8 Entered By: Rosalio Loud on 04/02/2022 09:58:10 -------------------------------------------------------------------------------- Pain Assessment Details Patient Name: Date of Service: FLANNERY, CAVALLERO NICE H. 04/02/2022 9:30 A M Medical Record Number: [id number] Patient Account Number: [id number] Date of Birth/Sex: Treating RN: 04-May-1950 (71 y.o. Female) Rosalio Loud Primary Care Bertie Mcconathy: PA Haig Prophet, NO Other Clinician: Referring Akeia Perot: Treating Justeen Hehr/Extender: Selena Batten, RO NA LD Weeks in Treatment: 0 Active Problems Location of Pain Severity and Description of Pain Patient Has Paino No Site Locations Pain Management and Medication Current Pain Management: Electronic Signature(s) Signed: 04/02/2022 5:08:17 PM By: Rosalio Loud MSN RN CNS WTA Entered By: Rosalio Loud on 04/02/2022  10:05:24 -------------------------------------------------------------------------------- Patient/Caregiver Education Details Patient Name: Date of Service: Morgan Dixon NICE Lemmie Evens 12/18/2023andnbsp9:30 A M Medical Record Number: [id number] Patient Account Number: [id number] Date of Birth/Gender: Treating RN: March 22, 1951 (71 y.o. Female) Rosalio Loud Primary Care Physician: PA [address], [place] Other Clinician: JULIYA, MAGILL ([id number]) [id number] Page 8 of 8 Referring Physician: Treating Physician/Extender: Selena Batten, RO NA LD Weeks in Treatment: 0 Education Assessment Education Provided To: Patient Education Topics Provided Venous: Handouts: Managing Venous Insufficiency Methods: Explain/Verbal Responses: State content correctly Electronic Signature(s) Signed: 04/02/2022 5:08:17 PM By: Rosalio Loud MSN RN CNS WTA Entered By: Rosalio Loud on 04/02/2022 10:08:22 -------------------------------------------------------------------------------- Vitals Details Patient Name: Date of Service: Morgan Dixon NICE H. 04/02/2022 9:30 A M Medical Record Number: [id number] Patient Account Number: [id number] Date of Birth/Sex: Treating RN: Mar 30, 1951 (71 y.o. Female) Rosalio Loud Primary Care Helene Bernstein: PA Haig Prophet, NO Other Clinician: Referring Jakaila Norment: Treating Rielly Corlett/Extender: Selena Batten, RO NA LD Weeks in Treatment: 0 Vital Signs Time  Taken: 09:31 Temperature (F): 97.8 Height (in): 66 Pulse (bpm): 65 Source: Stated Respiratory Rate (breaths/min): 16 Weight (lbs): 240 Blood Pressure (mmHg): 180/87 Source: Stated Reference Range: 80 - 120 mg / dl Body Mass Index (BMI): 38.7 Electronic Signature(s) Signed: 04/02/2022 5:08:17 PM By: Rosalio Loud MSN RN CNS WTA Entered By: Rosalio Loud on 04/02/2022 10:05:28

## 2022-04-03 NOTE — Progress Notes (Signed)
Morgan Dixon, Morgan Dixon ([id number]) [id number] Page 1 of 9 Visit Report for 04/02/2022 Chief Complaint Document Details Patient Name: Date of Service: [name], [name] 04/02/2022 9:30 A M Medical Record Number: [id number] Patient Account Number: [id number] Date of Birth/Sex: Treating RN: [birth year] (71 y.o. Female) Morgan Dixon Primary Care Provider: PA Morgan Dixon, NO Other Clinician: Referring Provider: Treating Provider/Extender: Morgan Dixon, RO NA LD Weeks in Treatment: 0 Information Obtained from: Patient Chief Complaint Bilateral LE Lymphedema Electronic Signature(s) Signed: 04/02/2022 10:02:37 AM By: Morgan Keeler PA-C Entered By: Morgan Dixon on 04/02/2022 10:02:37 -------------------------------------------------------------------------------- HPI Details Patient Name: Date of Service: Morgan Pate NICE H. 04/02/2022 9:30 A M Medical Record Number: [id number] Patient Account Number: [id number] Date of Birth/Sex: Treating RN: 21-Jun-1950 (71 y.o. Female) Morgan Dixon Primary Care Provider: PA Morgan Dixon, NO Other Clinician: Referring Provider: Treating Provider/Extender: Morgan Dixon, RO NA LD Weeks in Treatment: 0 History of Present Illness HPI Description: 04-02-2022 upon evaluation today patient presents for initial inspection here in our clinic concerning issues that she has been having with bilateral lower extremity lymphedema. Fortunately there does not appear to be any evidence of infection locally or systemically which is great and overall I am extremely pleased with where she stands. There is actually no open wounds at this point noted. She has not seen vascular she has not seen the lymphedema clinic either both of which I think could be good options. Again from a vascular standpoint the question really comes down to whether or not she could benefit from any type of venous ablation procedure. Again that would be left up to them  to determine. Nonetheless the patient states that she has never had this discussion with anyone so I think it is worth having. Patient does have a history of chronic venous insufficiency based on evaluation today. She also has stage III lymphedema. She has a history of hypertension as well as a personal history of stroke for which she has been on long-term anticoagulant therapy with Plavix. Electronic Signature(s) Signed: 04/02/2022 10:35:06 AM By: Morgan Keeler PA-C Entered By: Morgan Dixon on 04/02/2022 10:35:05 Morgan Dixon ([id number]) [id number] Page 2 of 9 -------------------------------------------------------------------------------- Physical Exam Details Patient Name: Date of Service: Morgan Dixon, Morgan Dixon NICE H. 04/02/2022 9:30 A M Medical Record Number: [id number] Patient Account Number: [id number] Date of Birth/Sex: Treating RN: 1951/03/30 (71 y.o. Female) Morgan Dixon Primary Care Provider: PA Morgan Dixon, NO Other Clinician: Referring Provider: Treating Provider/Extender: Morgan Dixon, RO NA LD Weeks in Treatment: 0 Constitutional patient is hypertensive.. pulse regular and within target range for patient.Marland Kitchen respirations regular, non-labored and within target range for patient.Marland Kitchen temperature within target range for patient.. Well-nourished and well-hydrated in no acute distress. Eyes conjunctiva clear no eyelid edema noted. pupils equal round and reactive to light and accommodation. Ears, Nose, Mouth, and Throat no gross abnormality of ear auricles or external auditory canals. normal hearing noted during conversation. mucus membranes moist. Respiratory normal breathing without difficulty. Cardiovascular 2+ dorsalis pedis/posterior tibialis pulses. 2+ pitting edema of the bilateral lower extremities. Musculoskeletal normal gait and posture. no significant deformity or arthritic changes, no loss or range of motion, no  clubbing. Psychiatric this patient is able to make decisions and demonstrates good insight into disease process. Alert and Oriented x 3. pleasant and cooperative. Notes Upon inspection patient's legs actually are showing signs of doing decently well. I do not see anything open at this point which is good news. Fortunately she  has no other major medical problems at this time which is also good news. No fevers, chills, nausea, vomiting, or diarrhea. Electronic Signature(s) Signed: 04/02/2022 10:36:36 AM By: Morgan Keeler PA-C Previous Signature: 04/02/2022 10:35:23 AM Version By: Morgan Keeler PA-C Entered By: Morgan Dixon on 04/02/2022 10:36:36 -------------------------------------------------------------------------------- Physician Orders Details Patient Name: Date of Service: Morgan Pate NICE H. 04/02/2022 9:30 A M Medical Record Number: [id number] Patient Account Number: [id number] Date of Birth/Sex: Treating RN: [birth year] (71 y.o. Female) Morgan Dixon Primary Care Provider: PA Morgan Dixon, NO Other Clinician: Referring Provider: Treating Provider/Extender: Morgan Dixon, RO NA LD Weeks in TreatmentELSA, Morgan Dixon ([id number]) [id number] Page 3 of 9 Verbal / Phone Orders: No Diagnosis Coding ICD-10 Coding Code Description I89.0 Lymphedema, not elsewhere classified I87.323 Chronic venous hypertension (idiopathic) with inflammation of bilateral lower extremity I73.89 Other specified peripheral vascular diseases I10 Essential (primary) hypertension Z79.01 Long term (current) use of anticoagulants Z86.73 Personal history of transient ischemic attack (TIA), and cerebral infarction without residual deficits Discharge From [facility] Services Consult Only - Refer to [facility] clinic and [place] [facility] Consults Vascular - Provider evaluation with  Vein and Vascular - (ICD10 I87.323 - Chronic venous hypertension (idiopathic) with inflammation  of bilateral lower extremity) Occupational Therapy - Patient requires OT evaluation and treatment for chronic bilateral lymphadema - (ICD10 I89.0 - Lymphedema, not elsewhere classified) Electronic Signature(s) Signed: 04/02/2022 4:45:38 PM By: Morgan Keeler PA-C Signed: 04/02/2022 5:08:17 PM By: Morgan Loud MSN RN CNS WTA Entered By: Morgan Dixon on 04/02/2022 10:22:02 Prescription 04/02/2022 -------------------------------------------------------------------------------- Titus Dubin H. Jeri Cos PA-C Patient Name: Provider: [birth year] [id number] Date of Birth: NPI#: Female [id number] Sex: DEA #: [phone] Phone #: License #: [place] Patient Address: [address] [facility] [place], [place] [address] [address], [place], [place] [address] [phone] Allergies Penicillin Provider's Orders Occupational Therapy - ICD10: I89.0 - Patient requires OT evaluation and treatment for chronic bilateral lymphadema Hand Signature: Date(s): Electronic Signature(s) Signed: 04/02/2022 4:45:38 PM By: Morgan Keeler PA-C Signed: 04/02/2022 5:08:17 PM By: Morgan Loud MSN RN CNS WTA Previous Signature: 04/02/2022 10:18:30 AM Version By: Morgan Keeler PA-C Entered By: Morgan Dixon on 04/02/2022 10:22:02 Morgan Dixon, Morgan Dixon ([id number]) [id number] Page 4 of 9 -------------------------------------------------------------------------------- Problem List Details Patient Name: Date of Service: Morgan Dixon, Morgan Dixon NICE H. 04/02/2022 9:30 A M Medical Record Number: [id number] Patient Account Number: [id number] Date of Birth/Sex: Treating RN: [birth year] (71 y.o. Female) Morgan Dixon Primary Care Provider: PA Morgan Dixon, NO Other Clinician: Referring Provider: Treating Provider/Extender: Morgan Dixon, RO NA LD Weeks in Treatment: 0 Active Problems ICD-10 Encounter Code Description  Active Date MDM Diagnosis I89.0 Lymphedema, not elsewhere classified 04/02/2022 No Yes I87.323 Chronic venous hypertension (idiopathic) with inflammation of bilateral lower 04/02/2022 No Yes extremity I73.89 Other specified peripheral vascular diseases 04/02/2022 No Yes I10 Essential (primary) hypertension 04/02/2022 No Yes Z79.01 Long term (current) use of anticoagulants 04/02/2022 No Yes Z86.73 Personal history of transient ischemic attack (TIA), and cerebral infarction 04/02/2022 No Yes without residual deficits Inactive Problems Resolved Problems Electronic Signature(s) Signed: 04/02/2022 10:41:18 AM By: Morgan Loud MSN RN CNS WTA Signed: 04/02/2022 4:45:38 PM By: Morgan Keeler PA-C Previous Signature: 04/02/2022 10:02:15 AM Version By: Morgan Keeler PA-C Entered By: Morgan Dixon on 04/02/2022 10:41:18 Morgan Dixon ([id number]) [id number] Page 5 of 9 -------------------------------------------------------------------------------- Progress Note Details Patient Name: Date of Service: Morgan Dixon, Morgan Dixon NICE H. 04/02/2022 9:30  A M Medical Record Number: [id number] Patient Account Number: [id number] Date of Birth/Sex: Treating RN: [birth year] (71 y.o. Female) Morgan Dixon Primary Care Provider: PA Morgan Dixon, NO Other Clinician: Referring Provider: Treating Provider/Extender: Morgan Dixon, RO NA LD Weeks in Treatment: 0 Subjective Chief Complaint Information obtained from Patient Bilateral LE Lymphedema History of Present Illness (HPI) 04-02-2022 upon evaluation today patient presents for initial inspection here in our clinic concerning issues that she has been having with bilateral lower extremity lymphedema. Fortunately there does not appear to be any evidence of infection locally or systemically which is great and overall I am extremely pleased with where she stands. There is actually no open wounds at this point noted. She has not seen  vascular she has not seen the lymphedema clinic either both of which I think could be good options. Again from a vascular standpoint the question really comes down to whether or not she could benefit from any type of venous ablation procedure. Again that would be left up to them to determine. Nonetheless the patient states that she has never had this discussion with anyone so I think it is worth having. Patient does have a history of chronic venous insufficiency based on evaluation today. She also has stage III lymphedema. She has a history of hypertension as well as a personal history of stroke for which she has been on long-term anticoagulant therapy with Plavix. Patient History Information obtained from Patient. Allergies Penicillin (Severity: Moderate, Reaction: Rash) Social History Never smoker, Marital Status - Widowed, Alcohol Use - Never, Drug Use - No History, Caffeine Use - Rarely. Medical History Cardiovascular Patient has history of Hypertension Endocrine Denies history of Type I Diabetes, Type II Diabetes Neurologic Denies history of Dementia, Neuropathy, Quadriplegia, Paraplegia, Seizure Disorder Medical A Surgical History Notes nd Neurologic Hx CVA with no risiduals Review of Systems (ROS) Constitutional Symptoms (General Health) Denies complaints or symptoms of Fatigue, Fever, Chills, Marked Weight Change. Eyes Denies complaints or symptoms of Dry Eyes, Vision Changes, Glasses / Contacts. Ear/Nose/Mouth/Throat Denies complaints or symptoms of Difficult clearing ears, Sinusitis. Hematologic/Lymphatic Denies complaints or symptoms of Bleeding / Clotting Disorders, Human Immunodeficiency Virus. Respiratory Denies complaints or symptoms of Chronic or frequent coughs, Shortness of Breath. Cardiovascular Denies complaints or symptoms of Chest pain, LE edema. Endocrine Denies complaints or symptoms of Hepatitis, Thyroid disease, Polydypsia (Excessive  Thirst). Genitourinary Denies complaints or symptoms of Kidney failure/ Dialysis, Incontinence/dribbling. Immunological Denies complaints or symptoms of Hives, Itching. Integumentary (Skin) Complains or has symptoms of Wounds. Denies complaints or symptoms of Bleeding or bruising tendency, Breakdown, Swelling. Musculoskeletal Denies complaints or symptoms of Muscle Pain, Muscle Weakness. Neurologic Morgan Dixon, Morgan Dixon ([id number]) [id number] Page 6 of 9 Denies complaints or symptoms of Numbness/parasthesias, Focal/Weakness. Psychiatric Denies complaints or symptoms of Anxiety, Claustrophobia. Objective Constitutional patient is hypertensive.. pulse regular and within target range for patient.Marland Kitchen respirations regular, non-labored and within target range for patient.Marland Kitchen temperature within target range for patient.. Well-nourished and well-hydrated in no acute distress. Vitals Time Taken: 9:31 AM, Height: 66 in, Source: Stated, Weight: 240 lbs, Source: Stated, BMI: 38.7, Temperature: 97.8 F, Pulse: 65 bpm, Respiratory Rate: 16 breaths/min, Blood Pressure: 180/87 mmHg. Eyes conjunctiva clear no eyelid edema noted. pupils equal round and reactive to light and accommodation. Ears, Nose, Mouth, and Throat no gross abnormality of ear auricles or external auditory canals. normal hearing noted during conversation. mucus membranes moist. Respiratory normal breathing without difficulty. Cardiovascular 2+ dorsalis pedis/posterior tibialis pulses. 2+ pitting edema of the  bilateral lower extremities. Musculoskeletal normal gait and posture. no significant deformity or arthritic changes, no loss or range of motion, no clubbing. Psychiatric this patient is able to make decisions and demonstrates good insight into disease process. Alert and Oriented x 3. pleasant and cooperative. General Notes: Upon inspection patient's legs actually are showing signs of doing decently well. I  do not see anything open at this point which is good news. Fortunately she has no other major medical problems at this time which is also good news. No fevers, chills, nausea, vomiting, or diarrhea. Other Condition(s) Patient presents with Lymphedema located on the Left Leg. Assessment Active Problems ICD-10 Lymphedema, not elsewhere classified Chronic venous hypertension (idiopathic) with inflammation of bilateral lower extremity Other specified peripheral vascular diseases Essential (primary) hypertension Long term (current) use of anticoagulants Personal history of transient ischemic attack (TIA), and cerebral infarction without residual deficits Plan Discharge From [facility] Services: Consult Only - Refer to [facility] clinic and [place] [facility] Consults ordered were: Vascular - Provider evaluation with  Vein and Vascular, Occupational Therapy - Patient requires OT evaluation and treatment for chronic bilateral lymphadema 1. Based on what I see I do believe that the patient would benefit from a evaluation at the lymphedema clinic. I think that they could be very beneficial in helping to get things under control which in turn would help to keep her from having any open wounds which again would prevent any issues with developing a cellulitis type situation. Again all this was discussed with the patient during the office visit today. We are going to make that referral to occupational therapy/lymphedema clinic for further evaluation and therapy. 2. I am also can recommend at this time based on what I am seeing that we have the patient continue to monitor for any signs of infection or worsening. Obviously if anything changes she should let me know but right now I do not see any evidence of infection currently. 3. I would also suggest that the patient should continue to elevate her legs is much as possible and also can make the referral to the vascular surgeons office in order to discuss any  possibility of venous ablation. She is never had that discussion so I think that is worth having. We will see the patient back for follow-up visit as needed. Morgan Dixon, Morgan Dixon ([id number]) [id number] Page 7 of 9 Electronic Signature(s) Signed: 04/02/2022 10:37:30 AM By: Morgan Keeler PA-C Entered By: Morgan Dixon on 04/02/2022 10:37:29 -------------------------------------------------------------------------------- ROS/PFSH Details Patient Name: Date of Service: Morgan Pate NICE H. 04/02/2022 9:30 A M Medical Record Number: [id number] Patient Account Number: [id number] Date of Birth/Sex: Treating RN: May 10, 1950 (71 y.o. Female) Morgan Dixon Primary Care Provider: PA Morgan Dixon, NO Other Clinician: Referring Provider: Treating Provider/Extender: Morgan Dixon, RO NA LD Weeks in Treatment: 0 Information Obtained From Patient Constitutional Symptoms (General Health) Complaints and Symptoms: Negative for: Fatigue; Fever; Chills; Marked Weight Change Eyes Complaints and Symptoms: Negative for: Dry Eyes; Vision Changes; Glasses / Contacts Ear/Nose/Mouth/Throat Complaints and Symptoms: Negative for: Difficult clearing ears; Sinusitis Hematologic/Lymphatic Complaints and Symptoms: Negative for: Bleeding / Clotting Disorders; Human Immunodeficiency Virus Respiratory Complaints and Symptoms: Negative for: Chronic or frequent coughs; Shortness of Breath Cardiovascular Complaints and Symptoms: Negative for: Chest pain; LE edema Medical History: Positive for: Hypertension Endocrine Complaints and Symptoms: Negative for: Hepatitis; Thyroid disease; Polydypsia (Excessive Thirst) Medical History: Negative for: Type I Diabetes; Type II Diabetes Genitourinary Complaints and Symptoms: Negative for: Kidney failure/ Dialysis; Incontinence/dribbling Dicioccio, [employer]  H ([id number]) [id number] Page 8 of  9 Immunological Complaints and Symptoms: Negative for: Hives; Itching Integumentary (Skin) Complaints and Symptoms: Positive for: Wounds Negative for: Bleeding or bruising tendency; Breakdown; Swelling Musculoskeletal Complaints and Symptoms: Negative for: Muscle Pain; Muscle Weakness Neurologic Complaints and Symptoms: Negative for: Numbness/parasthesias; Focal/Weakness Medical History: Negative for: Dementia; Neuropathy; Quadriplegia; Paraplegia; Seizure Disorder Past Medical History Notes: Hx CVA with no risiduals Psychiatric Complaints and Symptoms: Negative for: Anxiety; Claustrophobia Oncologic Immunizations Pneumococcal Vaccine: Received Pneumococcal Vaccination: Yes Received Pneumococcal Vaccination On or After 60th Birthday: Yes Implantable Devices No devices added Family and Social History Never smoker; Marital Status - Widowed; Alcohol Use: Never; Drug Use: No History; Caffeine Use: Rarely Electronic Signature(s) Signed: 04/02/2022 4:45:38 PM By: Morgan Keeler PA-C Signed: 04/02/2022 5:08:17 PM By: Morgan Loud MSN RN CNS WTA Entered By: Morgan Dixon on 04/02/2022 09:51:47 -------------------------------------------------------------------------------- SuperBill Details Patient Name: Date of Service: Morgan Pate NICE H. 04/02/2022 Medical Record Number: [id number] Patient Account Number: [id number] Date of Birth/Sex: Treating RN: 1950/08/31 (71 y.o. Female) Morgan Dixon Primary Care Provider: PA Morgan Dixon, NO Other Clinician: Referring Provider: Treating Provider/Extender: Morgan Dixon, RO NA LD Weeks in Treatment: 0 Diagnosis Coding ICD-10 Codes Morgan Dixon, Morgan Dixon ([id number]) [id number] Page 9 of 9 Code Description I89.0 Lymphedema, not elsewhere classified I87.323 Chronic venous hypertension (idiopathic) with inflammation of bilateral lower extremity I73.89 Other specified peripheral vascular diseases I10 Essential  (primary) hypertension Z79.01 Long term (current) use of anticoagulants Z86.73 Personal history of transient ischemic attack (TIA), and cerebral infarction without residual deficits Facility Procedures : CPT4 Code: [id number] Description: 73428 - WOUND CARE VISIT-LEV 3 EST PT Modifier: Quantity: 1 Physician Procedures : CPT4 Code Description Modifier [id number] WC PHYS LEVEL 3 NEW PT ICD-10 Diagnosis Description I89.0 Lymphedema, not elsewhere classified I87.323 Chronic venous hypertension (idiopathic) with inflammation of bilateral lower extremity I73.89 Other specified  peripheral vascular diseases I10 Essential (primary) hypertension Quantity: 1 Electronic Signature(s) Signed: 04/02/2022 10:37:50 AM By: Morgan Keeler PA-C Entered By: Morgan Dixon on 04/02/2022 10:37:50

## 2022-04-03 NOTE — Progress Notes (Signed)
SANTOS, HARDWICK ([id number]) [id number] Page 1 of 5 Visit Report for 04/02/2022 Abuse Risk Screen Details Patient Name: Date of Service: Morgan Dixon, Morgan Dixon Morgan Dixon. 04/02/2022 9:30 A M Medical Record Number: [id number] Patient Account Number: [id number] Date of Birth/Sex: Treating RN: 10-28-1950 (71 y.o. Female) Rosalio Loud Primary Care Kerrigan Glendening: PA Haig Prophet, NO Other Clinician: Referring Macy Polio: Treating Dina Warbington/Extender: Selena Batten, RO NA LD Weeks in Treatment: 0 Abuse Risk Screen Items Answer Electronic Signature(s) Signed: 04/02/2022 5:08:17 PM By: Rosalio Loud MSN RN CNS WTA Entered By: Rosalio Loud on 04/02/2022 09:51:53 -------------------------------------------------------------------------------- Activities of Daily Living Details Patient Name: Date of Service: Morgan Dixon, Morgan Morgan Dixon. 04/02/2022 9:30 A M Medical Record Number: [id number] Patient Account Number: [id number] Date of Birth/Sex: Treating RN: [birth year] (71 y.o. Female) Rosalio Loud Primary Care Bliss Behnke: PA Haig Prophet, NO Other Clinician: Referring Mare Ludtke: Treating Havanna Groner/Extender: Selena Batten, RO NA LD Weeks in Treatment: 0 Activities of Daily Living Items Answer Activities of Daily Living (Please select one for each item) Drive Automobile Completely Able T Medications ake Completely Able Use T elephone Completely Able Care for Appearance Completely Able Use T oilet Completely Able Bath / Shower Completely Able Dress Self Completely Able Feed Self Completely Able Walk Completely Able Get In / Out Bed Completely Able Housework Completely Able Prepare Meals Completely Percy for Self Completely TAMYKA, BEZIO ([id number]) [phone] [id number] Page 2 of 5 Electronic Signature(s) Signed: 04/02/2022 5:08:17 PM By: Rosalio Loud MSN RN CNS WTA Entered By: Rosalio Loud on 04/02/2022  09:52:09 -------------------------------------------------------------------------------- Education Screening Details Patient Name: Date of Service: Morgan Pate Morgan Dixon. 04/02/2022 9:30 A M Medical Record Number: [id number] Patient Account Number: [id number] Date of Birth/Sex: Treating RN: August 05, 1950 (71 y.o. Female) Rosalio Loud Primary Care Merridy Pascoe: PA Haig Prophet, NO Other Clinician: Referring Leanord Thibeau: Treating Eros Montour/Extender: Selena Batten, RO NA LD Weeks in Treatment: 0 Primary Learner Assessed: Patient Learning Preferences/Education Level/Primary Language Learning Preference: Explanation Highest Education Level: College or Above Preferred Language: English Cognitive Barrier Language Barrier: No Translator Needed: No Memory Deficit: No Emotional Barrier: No Cultural/Religious Beliefs Affecting Medical Care: No Physical Barrier Impaired Vision: No Impaired Hearing: No Decreased Hand dexterity: No Knowledge/Comprehension Knowledge Level: High Comprehension Level: High Ability to understand written instructions: High Ability to understand verbal instructions: High Motivation Anxiety Level: Calm Cooperation: Cooperative Education Importance: Acknowledges Need Interest in Health Problems: Asks Questions Perception: Coherent Willingness to Engage in Self-Management High Activities: Readiness to Engage in Self-Management High Activities: Electronic Signature(s) Signed: 04/02/2022 5:08:17 PM By: Rosalio Loud MSN RN CNS WTA Entered By: Rosalio Loud on 04/02/2022 09:53:00 Raynelle Chary ([id number]) [phone] [id number] Page 3 of 5 -------------------------------------------------------------------------------- Fall Risk Assessment Details Patient Name: Date of Service: Morgan Dixon, Morgan Morgan Dixon. 04/02/2022 9:30 A M Medical Record Number: [id number] Patient Account Number: [id number] Date of Birth/Sex: Treating RN: 08-12-1950 (71 y.o. Female)  Rosalio Loud Primary Care Terrace Chiem: PA Haig Prophet, NO Other Clinician: Referring Donyale Berthold: Treating Kalaysia Demonbreun/Extender: Selena Batten, RO NA LD Weeks in Treatment: 0 Fall Risk Assessment Items Have you had 2 or more falls in the last 12 monthso 0 No Have you had any fall that resulted in injury in the last 12 monthso 0 No FALLS RISK SCREEN History of falling - immediate or within 3 months 0 No Secondary diagnosis (Do you have 2 or more medical diagnoseso) 0 No Ambulatory aid None/bed rest/wheelchair/nurse 0 No Crutches/cane/walker 0 No Furniture 0 No Intravenous therapy  Access/Saline/Heparin Lock 0 No Gait/Transferring Normal/ bed rest/ wheelchair 0 No Weak (short steps with or without shuffle, stooped but able to lift head while walking, may seek 0 No support from furniture) Impaired (short steps with shuffle, may have difficulty arising from chair, head down, impaired 0 No balance) Mental Status Oriented to own ability 0 Yes Electronic Signature(s) Signed: 04/02/2022 5:08:17 PM By: Rosalio Loud MSN RN CNS WTA Entered By: Rosalio Loud on 04/02/2022 09:53:21 -------------------------------------------------------------------------------- Foot Assessment Details Patient Name: Date of Service: Morgan Pate Morgan Dixon. 04/02/2022 9:30 A M Medical Record Number: [id number] Patient Account Number: [id number] Date of Birth/Sex: Treating RN: Jun 25, 1950 (71 y.o. Female) Rosalio Loud Primary Care Esterlene Atiyeh: PA Haig Prophet, NO Other Clinician: Referring Carlena Ruybal: Treating Natsha Guidry/Extender: Selena Batten, RO NA LD Weeks in Treatment: 0 Foot Assessment Items [address] Morgan Dixon, Morgan Dixon ([id number]) [id number] Page 4 of 5 + = Sensation present, - = Sensation absent, C = Callus, U = Ulcer R = Redness, W = Warmth, M = Maceration, PU = Pre-ulcerative lesion F = Fissure, S = Swelling, D = Dryness Assessment Right: Left: Other Deformity: No  No Prior Foot Ulcer: No No Prior Amputation: No No Charcot Joint: No No Ambulatory Status: Ambulatory Without Help Gait: Electronic Signature(s) Signed: 04/02/2022 5:08:17 PM By: Rosalio Loud MSN RN CNS WTA Entered By: Rosalio Loud on 04/02/2022 09:53:45 -------------------------------------------------------------------------------- Nutrition Risk Screening Details Patient Name: Date of Service: Morgan Pate Morgan Dixon. 04/02/2022 9:30 A M Medical Record Number: [id number] Patient Account Number: [id number] Date of Birth/Sex: Treating RN: 27-Mar-1951 (71 y.o. Female) Rosalio Loud Primary Care Laila Myhre: PA Haig Prophet, NO Other Clinician: Referring Anishka Bushard: Treating Chizaram Latino/Extender: Selena Batten, RO NA LD Weeks in Treatment: 0 Height (in): 66 Weight (lbs): 240 Body Mass Index (BMI): 38.7 Nutrition Risk Screening Items Score Screening NUTRITION RISK SCREEN: I have an illness or condition that made me change the kind and/or amount of food I eat 0 No I eat fewer than two meals per day 0 No I eat few fruits and vegetables, or milk products 0 No I have three or more drinks of beer, liquor or wine almost every day 0 No I have tooth or mouth problems that make it hard for me to eat 0 No I don't always have enough money to buy the food I need 0 No Morgan Dixon, Morgan Dixon ([id number]) Irena Reichmann [id number] Page 5 of 5 I eat alone most of the time 0 No I take three or more different prescribed or over-the-counter drugs a day 0 No Without wanting to, I have lost or gained 10 pounds in the last six months 0 No I am not always physically able to shop, cook and/or feed myself 0 No Nutrition Protocols Good Risk Protocol 0 No interventions needed Moderate Risk Protocol High Risk Proctocol Risk Level: Good Risk Score: 0 Electronic Signature(s) Signed: 04/02/2022 5:08:17 PM By: Rosalio Loud MSN RN CNS WTA Entered By: Rosalio Loud on 04/02/2022 09:53:29

## 2022-04-27 ENCOUNTER — Encounter (INDEPENDENT_AMBULATORY_CARE_PROVIDER_SITE_OTHER): Payer: Medicare HMO | Admitting: Nurse Practitioner

## 2022-05-15 DIAGNOSIS — Z6839 Body mass index (BMI) 39.0-39.9, adult: Secondary | ICD-10-CM | POA: Diagnosis not present

## 2022-05-15 DIAGNOSIS — I639 Cerebral infarction, unspecified: Secondary | ICD-10-CM | POA: Diagnosis not present

## 2022-05-15 DIAGNOSIS — Z9884 Bariatric surgery status: Secondary | ICD-10-CM | POA: Diagnosis not present

## 2022-05-15 DIAGNOSIS — Z79899 Other long term (current) drug therapy: Secondary | ICD-10-CM | POA: Diagnosis not present

## 2022-05-15 DIAGNOSIS — F1190 Opioid use, unspecified, uncomplicated: Secondary | ICD-10-CM | POA: Diagnosis not present

## 2022-05-15 DIAGNOSIS — R739 Hyperglycemia, unspecified: Secondary | ICD-10-CM | POA: Diagnosis not present

## 2022-05-15 DIAGNOSIS — M5441 Lumbago with sciatica, right side: Secondary | ICD-10-CM | POA: Diagnosis not present

## 2022-05-15 DIAGNOSIS — I10 Essential (primary) hypertension: Secondary | ICD-10-CM | POA: Diagnosis not present

## 2022-05-15 DIAGNOSIS — D649 Anemia, unspecified: Secondary | ICD-10-CM | POA: Diagnosis not present

## 2022-05-15 DIAGNOSIS — Z8673 Personal history of transient ischemic attack (TIA), and cerebral infarction without residual deficits: Secondary | ICD-10-CM | POA: Diagnosis not present

## 2022-05-15 DIAGNOSIS — Z9689 Presence of other specified functional implants: Secondary | ICD-10-CM | POA: Diagnosis not present

## 2022-05-15 DIAGNOSIS — E6609 Other obesity due to excess calories: Secondary | ICD-10-CM | POA: Diagnosis not present

## 2022-05-15 DIAGNOSIS — I890 Lymphedema, not elsewhere classified: Secondary | ICD-10-CM | POA: Diagnosis not present

## 2022-05-15 DIAGNOSIS — F32A Depression, unspecified: Secondary | ICD-10-CM | POA: Diagnosis not present

## 2022-05-15 DIAGNOSIS — Z903 Acquired absence of stomach [part of]: Secondary | ICD-10-CM | POA: Diagnosis not present

## 2022-05-15 DIAGNOSIS — F4321 Adjustment disorder with depressed mood: Secondary | ICD-10-CM | POA: Diagnosis not present

## 2022-05-23 DIAGNOSIS — E538 Deficiency of other specified B group vitamins: Secondary | ICD-10-CM | POA: Diagnosis not present

## 2022-05-30 DIAGNOSIS — E538 Deficiency of other specified B group vitamins: Secondary | ICD-10-CM | POA: Diagnosis not present

## 2022-06-13 DIAGNOSIS — E538 Deficiency of other specified B group vitamins: Secondary | ICD-10-CM | POA: Diagnosis not present

## 2022-08-03 ENCOUNTER — Encounter (INDEPENDENT_AMBULATORY_CARE_PROVIDER_SITE_OTHER): Payer: Medicare HMO | Admitting: Vascular Surgery

## 2022-08-28 ENCOUNTER — Ambulatory Visit (INDEPENDENT_AMBULATORY_CARE_PROVIDER_SITE_OTHER): Payer: Medicare HMO | Admitting: Vascular Surgery

## 2022-08-28 ENCOUNTER — Encounter (INDEPENDENT_AMBULATORY_CARE_PROVIDER_SITE_OTHER): Payer: Self-pay | Admitting: Vascular Surgery

## 2022-08-28 VITALS — BP 146/77 | HR 55 | Resp 16 | Ht 66.0 in | Wt 262.4 lb

## 2022-08-28 DIAGNOSIS — I10 Essential (primary) hypertension: Secondary | ICD-10-CM | POA: Diagnosis not present

## 2022-08-28 DIAGNOSIS — I639 Cerebral infarction, unspecified: Secondary | ICD-10-CM | POA: Diagnosis not present

## 2022-08-28 DIAGNOSIS — I890 Lymphedema, not elsewhere classified: Secondary | ICD-10-CM | POA: Diagnosis not present

## 2022-08-28 DIAGNOSIS — I739 Peripheral vascular disease, unspecified: Secondary | ICD-10-CM | POA: Diagnosis not present

## 2022-08-28 NOTE — Assessment & Plan Note (Signed)
With some residual expressive aphasia.

## 2022-08-28 NOTE — Patient Instructions (Signed)
Lymphedema ? ?Lymphedema is swelling that is caused by the abnormal collection of lymph in the tissues under the skin. Lymph is excess fluid from the tissues in your body that is removed through the lymphatic system. This system is part of your body's defense system (immune system) and includes lymph nodes and lymph vessels. The lymph vessels collect and carry the excess fluid, fats, proteins, and waste from the tissues of the body to the bloodstream. This system also works to clean and remove bacteria and waste products from the body. ?Lymphedema occurs when the lymphatic system is blocked. When the lymph vessels or lymph nodes are blocked or damaged, lymph does not drain properly. This causes an abnormal buildup of lymph, which leads to swelling in the affected area. This may include the trunk area, or an arm or leg. Lymphedema cannot be cured by medicines, but various methods can be used to help reduce the swelling. ?What are the causes? ?The cause of this condition depends on the type of lymphedema that you have. ?Primary lymphedema is caused by the absence of lymph vessels or having abnormal lymph vessels at birth. ?Secondary lymphedema occurs when lymph vessels are blocked or damaged. Secondary lymphedema is more common. Common causes of lymph vessel blockage include: ?Skin infection, such as cellulitis. ?Infection by parasites (filariasis). ?Injury. ?Radiation therapy. ?Cancer. ?Formation of scar tissue. ?Surgery. ?What are the signs or symptoms? ?Symptoms of this condition include: ?Swelling of the arm or leg. ?A heavy or tight feeling in the arm or leg. ?Swelling of the feet, toes, or fingers. Shoes or rings may fit more tightly than before. ?Redness of the skin over the affected area. ?Limited movement of the affected limb. ?Sensitivity to touch or discomfort in the affected limb. ?How is this diagnosed? ?This condition may be diagnosed based on: ?Your symptoms and medical history. ?A physical  exam. ?Bioimpedance spectroscopy. In this test, painless electrical currents are used to measure fluid levels in your body. ?Imaging tests, such as: ?MRI. ?CT scan. ?Duplex ultrasound. This test uses sound waves to produce images of the vessels and the blood flow on a screen. ?Lymphoscintigraphy. In this test, a low dose of a radioactive substance is injected to trace the flow of lymph through your lymph vessels. ?Lymphangiography. In this test, a contrast dye is injected into the lymph vessel to help show blockages. ?How is this treated? ? ?If an underlying condition is causing the lymphedema, that condition will be treated. For example, antibiotic medicines may be used to treat an infection. ?Treatment for this condition will depend on the cause of your lymphedema. Treatment may include: ?Complete decongestive therapy (CDT). This is done by a certified lymphedema therapist to reduce fluid congestion. This therapy includes: ?Skin care. ?Compression wrapping of the affected area. ?Manual lymph drainage. This is a special massage technique that promotes lymph drainage out of a limb. ?Specific exercises. Certain exercises can help fluid move out of the affected limb. ?Compression. Various methods may be used to apply pressure to the affected limb to reduce the swelling. They include: ?Wearing compression stockings or sleeves on the affected limb. ?Wrapping the affected limb with special bandages. ?Surgery. This is usually done for severe cases only. For example, surgery may be done if you have trouble moving the limb or if the swelling does not get better with other treatments. ?Follow these instructions at home: ?Self-care ?The affected area is more likely to become injured or infected. Take these steps to help prevent infection: ?Keep the affected   area clean and dry. ?Use approved creams or lotions to keep the skin moisturized. ?Protect your skin from cuts: ?Use gloves while cooking or gardening. ?Do not walk  barefoot. ?If you shave the affected area, use an electric razor. ?Do not wear tight clothes, shoes, or jewelry. ?Eat a healthy diet that includes a lot of fruits and vegetables. ?Activity ?Do exercises as told by your health care provider. ?Do not sit with your legs crossed. ?When possible, keep the affected limb raised (elevated) above the level of your heart. ?Avoid carrying things with an arm that is affected by lymphedema. ?General instructions ?Wear compression stockings or sleeves as told by your health care provider. ?Note any changes in size of the affected limb. You may be instructed to take regular measurements and keep track of them. ?Take over-the-counter and prescription medicines only as told by your health care provider. ?If you were prescribed an antibiotic medicine, take or apply it as told by your health care provider. Do not stop using the antibiotic even if you start to feel better or if your condition improves. ?Do not use heating pads or ice packs on the affected area. ?Avoid having blood draws, IV insertions, or blood pressure checks on the affected limb. ?Keep all follow-up visits. This is important. ?Contact a health care provider if you: ?Continue to have swelling in your limb. ?Have fluid leaking from the skin of your swollen limb. ?Have a cut that does not heal. ?Have redness or pain in the affected area. ?Develop purplish spots, rash, blisters, or sores (lesions) on your affected limb. ?Get help right away if you: ?Have new swelling in your limb that starts suddenly. ?Have shortness of breath or chest pain. ?Have a fever or chills. ?These symptoms may represent a serious problem that is an emergency. Do not wait to see if the symptoms will go away. Get medical help right away. Call your local emergency services (911 in the [place] Do not drive yourself to the hospital. ?Summary ?Lymphedema is swelling that is caused by the abnormal collection of lymph in the tissues under the  skin. ?Lymph is fluid from the tissues in your body that is removed through the lymphatic system. This system collects and carries excess fluid, fats, proteins, and wastes from the tissues of the body to the bloodstream. ?Lymphedema causes swelling, pain, and redness in the affected area. This may include the trunk area, or an arm or leg. ?Treatment for this condition may depend on the cause of your lymphedema. Treatment may include treating the underlying cause, complete decongestive therapy (CDT), compression methods, or surgery. ?This information is not intended to replace advice given to you by your health care provider. Make sure you discuss any questions you have with your health care provider. ?Document Revised: 01/27/2020 Document Reviewed: 01/27/2020 ?Elsevier Patient Education ? 2023 Else[employer] ? ?

## 2022-08-28 NOTE — Assessment & Plan Note (Signed)
Had a reduced ABI at the wound care center.  We will do formal noninvasive studies in the near future at her convenience.

## 2022-08-28 NOTE — Assessment & Plan Note (Signed)
blood pressure control important in reducing the progression of atherosclerotic disease. On appropriate oral medications.  

## 2022-08-28 NOTE — Progress Notes (Signed)
Patient ID: Morgan Dixon, female   DOB: 17-Jun-1950, 72 y.o.   MRN: [id number]  Chief Complaint  Patient presents with   New Patient (Initial Visit)    Ref Larina Bras consult lymphedema and abnormal ABI left 0.71    HPI Morgan Dixon is a 72 y.o. female.  I am asked to see the patient by Allen Derry for evaluation of severe lymphedema of the lower extremities as well as a reduced ABI on the left side on the study in the wound care center recently.  Patient has had longstanding severe lymphedema for many years.  We saw her about 5 years ago but she has not been seen since that time.  She admits to not wearing her compression socks as regularly as she should.  At this point, she has a very hard time getting these on.  Both legs are very swollen.  The left leg is a little more swollen than the right.  She has marked hyperpigmentation and scaling of the skin bilaterally.  This would equate to stage III lymphedema with intermittent areas of skin thickening and wounds that generally healed.  She does not have weeping fluid currently.  The legs are very heavy and tired easily.  She does not have any fevers or chills or signs of systemic infection.  No previous history of DVT or superficial thrombophlebitis to her knowledge.  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     Past Medical History:  Diagnosis Date   Chronic pain    Edema extremities     Past Surgical History:  Procedure Laterality Date   ABDOMINAL HYSTERECTOMY  1989   BACK SURGERY     GASTRIC BYPASS     ROBOTIC ASSISTED LAPAROSCOPIC LYSIS OF ADHESION N/A 12/30/2021   Procedure: XI ROBOTIC ASSISTED LAPAROSCOPIC LYSIS OF ADHESION;  Surgeon: Campbell Lerner, MD;  Location: [facility] ORS;  Service: General;  Laterality: N/A;   XI ROBOTIC ASSISTED VENTRAL HERNIA N/A 12/30/2021   Procedure: XI ROBOTIC ASSISTED VENTRAL HERNIA;  Surgeon: Campbell Lerner, MD;  Location: [facility] ORS;  Service: General;  Laterality: N/A;     Family History   Problem Relation Age of Onset   Breast cancer Maternal Aunt 58   Diabetes Father    Stroke Father   No bleeding or clotting disorders   Social History   Tobacco Use   Smoking status: Never   Smokeless tobacco: Never  Vaping Use   Vaping Use: Never used  Substance Use Topics   Alcohol use: Never   Drug use: Never     Allergies  Allergen Reactions   Bupropion Nausea And Vomiting   Diazepam Rash   Penicillins Itching   Rofecoxib Swelling   Latex Itching    When used gloves in the past   Prozac [Fluoxetine Hcl] Itching and Rash   Zinc Oxide [Mexsana] Rash    Current Outpatient Medications  Medication Sig Dispense Refill   acetaminophen (TYLENOL) 500 MG tablet Take 2 tablets (1,000 mg total) by mouth every 6 (six) hours as needed for moderate pain. 30 tablet 0   buprenorphine (SUBUTEX) 8 MG SUBL SL tablet Place 8 mg under the tongue 2 (two) times daily.     clopidogrel (PLAVIX) 75 MG tablet Take 75 mg by mouth daily.     losartan (COZAAR) 100 MG tablet Take 1 tablet (100 mg total) by mouth daily. 30 tablet 1   pregabalin (LYRICA) 100 MG capsule Take 200 mg by mouth daily.  sertraline (ZOLOFT) 100 MG tablet Take 100 mg by mouth at bedtime.     tiZANidine (ZANAFLEX) 4 MG tablet Take 4 mg by mouth 2 (two) times daily.     atorvastatin (LIPITOR) 40 MG tablet Take 1 tablet (40 mg total) by mouth daily. 30 tablet 2   hydrochlorothiazide (HYDRODIURIL) 25 MG tablet Take 25 mg by mouth daily.     No current facility-administered medications for this visit.      REVIEW OF SYSTEMS (Negative unless checked)  Constitutional: [] Weight loss  [] Fever  [] Chills Cardiac: [] Chest pain   [] Chest pressure   [] Palpitations   [] Shortness of breath when laying flat   [] Shortness of breath at rest   [] Shortness of breath with exertion. Vascular:  [] Pain in legs with walking   [] Pain in legs at rest   [] Pain in legs when laying flat   [] Claudication   [] Pain in feet when walking  [] Pain in  feet at rest  [] Pain in feet when laying flat   [] History of DVT   [] Phlebitis   [x] Swelling in legs   [] Varicose veins   [] Non-healing ulcers Pulmonary:   [] Uses home oxygen   [] Productive cough   [] Hemoptysis   [] Wheeze  [] COPD   [] Asthma Neurologic:  [] Dizziness  [] Blackouts   [] Seizures   [] History of stroke   [] History of TIA  [] Aphasia   [] Temporary blindness   [] Dysphagia   [] Weakness or numbness in arms   [] Weakness or numbness in legs Musculoskeletal:  [x] Arthritis   [] Joint swelling   [x] Joint pain   [] Low back pain Hematologic:  [] Easy bruising  [] Easy bleeding   [] Hypercoagulable state   [] Anemic  [] Hepatitis Gastrointestinal:  [] Blood in stool   [] Vomiting blood  [] Gastroesophageal reflux/heartburn   [] Abdominal pain Genitourinary:  [] Chronic kidney disease   [] Difficult urination  [] Frequent urination  [] Burning with urination   [] Hematuria Skin:  [] Rashes   [] Ulcers   [] Wounds Psychological:  [] History of anxiety   []  History of major depression.    Physical Exam BP (!) 146/77 (BP Location: Right Arm)   Pulse (!) 55   Resp 16   Ht 5\' 6"  (1.676 m)   Wt 262 lb 6.4 oz (119 kg)   BMI 42.35 kg/m  Gen:  WD/WN, NAD Head: [place]/AT, No temporalis wasting.  Ear/Nose/Throat: Hearing grossly intact, nares w/o erythema or drainage, oropharynx w/o Erythema/Exudate Eyes: Conjunctiva clear, sclera non-icteric  Neck: trachea midline.  No JVD.  Pulmonary:  Good air movement, respirations not labored, no use of accessory muscles  Cardiac: RRR, no JVD Vascular:  Vessel Right Left  Radial Palpable Palpable                          DP NP NP  PT NP NP   Gastrointestinal:. No masses, surgical incisions, or scars. Musculoskeletal: M/S 5/5 throughout.  Extremities without ischemic changes.  No deformity or atrophy.  Severe hyperpigmentation with marked stasis dermatitis changes present bilaterally.  Mild erythema.  3-4+ bilateral lower extremity edema Neurologic: Sensation grossly intact  in extremities.  Symmetrical.  Speech is somewhat choppy from expressive aphasia. Motor exam as listed above. Psychiatric: Judgment intact, Mood & affect appropriate for pt's clinical situation. Dermatologic: No rashes or ulcers noted.  No cellulitis or open wounds.    Radiology No results found.  Labs No results found for this or any previous visit (from the past 2160 hour(s)).  Assessment/Plan:  PAD (peripheral artery disease) (HCC) Had a  reduced ABI at the wound care center.  We will do formal noninvasive studies in the near future at her convenience.  Benign essential HTN .blood pressure control important in reducing the progression of atherosclerotic disease. On appropriate oral medications.   CVA (cerebral vascular accident) [facility]) With some residual expressive aphasia.  Lymphedema Patient has severe, stage III lymphedema with refractory swelling, marked skin changes with hyperpigmentation and severe skin thickening.  Going to wrap her in Unna boots today to get her swelling under better control get her skin changes somewhat improved.  She would clearly benefit from a lymphedema pump.  We will perform arterial and venous noninvasive studies in the near future at her convenience      Festus Barren 08/28/2022, 2:00 PM   This note was created with Dragon medical transcription system.  Any errors from dictation are unintentional.

## 2022-08-28 NOTE — Assessment & Plan Note (Signed)
Patient has severe, stage III lymphedema with refractory swelling, marked skin changes with hyperpigmentation and severe skin thickening.  Going to wrap her in Unna boots today to get her swelling under better control get her skin changes somewhat improved.  She would clearly benefit from a lymphedema pump.  We will perform arterial and venous noninvasive studies in the near future at her convenience

## 2022-09-04 ENCOUNTER — Ambulatory Visit (INDEPENDENT_AMBULATORY_CARE_PROVIDER_SITE_OTHER): Payer: Medicare HMO | Admitting: Nurse Practitioner

## 2022-09-04 ENCOUNTER — Encounter (INDEPENDENT_AMBULATORY_CARE_PROVIDER_SITE_OTHER): Payer: Self-pay

## 2022-09-04 VITALS — BP 169/72 | HR 50 | Resp 16

## 2022-09-04 DIAGNOSIS — I890 Lymphedema, not elsewhere classified: Secondary | ICD-10-CM

## 2022-09-04 NOTE — Progress Notes (Signed)
History of Present Illness  There is no documented history at this time  Assessments & Plan   There are no diagnoses linked to this encounter.    Additional instructions  Subjective:  Patient presents with venous ulcer of the Bilateral lower extremity.    Procedure:  3 layer unna wrap was placed Bilateral lower extremity.   Plan:   Follow up in one week.  

## 2022-09-11 ENCOUNTER — Ambulatory Visit (INDEPENDENT_AMBULATORY_CARE_PROVIDER_SITE_OTHER): Payer: Medicare HMO | Admitting: Nurse Practitioner

## 2022-09-11 ENCOUNTER — Encounter (INDEPENDENT_AMBULATORY_CARE_PROVIDER_SITE_OTHER): Payer: Self-pay

## 2022-09-11 VITALS — Resp 18 | Ht 66.0 in | Wt 260.6 lb

## 2022-09-11 DIAGNOSIS — I890 Lymphedema, not elsewhere classified: Secondary | ICD-10-CM

## 2022-09-11 NOTE — Progress Notes (Signed)
History of Present Illness  There is no documented history at this time  Assessments & Plan   There are no diagnoses linked to this encounter.    Additional instructions  Subjective:  Patient presents with venous ulcer of the Bilateral lower extremity.    Procedure:  3 layer unna wrap was placed Bilateral lower extremity.   Plan:   Follow up in one week.  

## 2022-09-18 ENCOUNTER — Encounter (INDEPENDENT_AMBULATORY_CARE_PROVIDER_SITE_OTHER): Payer: Self-pay

## 2022-09-18 ENCOUNTER — Ambulatory Visit (INDEPENDENT_AMBULATORY_CARE_PROVIDER_SITE_OTHER): Payer: Medicare HMO | Admitting: Vascular Surgery

## 2022-09-18 VITALS — BP 96/60 | HR 58 | Resp 18

## 2022-09-18 DIAGNOSIS — I890 Lymphedema, not elsewhere classified: Secondary | ICD-10-CM

## 2022-09-18 NOTE — Progress Notes (Signed)
History of Present Illness  There is no documented history at this time  Assessments & Plan   There are no diagnoses linked to this encounter.    Additional instructions  Subjective:  Patient presents with venous ulcer of the Bilateral lower extremity.    Procedure:  3 layer unna wrap was placed Bilateral lower extremity.   Plan:   Follow up in one week.  

## 2022-09-25 ENCOUNTER — Encounter (INDEPENDENT_AMBULATORY_CARE_PROVIDER_SITE_OTHER): Payer: Self-pay | Admitting: Nurse Practitioner

## 2022-09-25 ENCOUNTER — Ambulatory Visit (INDEPENDENT_AMBULATORY_CARE_PROVIDER_SITE_OTHER): Payer: Medicare HMO | Admitting: Nurse Practitioner

## 2022-09-25 VITALS — BP 169/77 | HR 63 | Resp 18

## 2022-09-25 DIAGNOSIS — I890 Lymphedema, not elsewhere classified: Secondary | ICD-10-CM | POA: Diagnosis not present

## 2022-09-25 NOTE — Progress Notes (Signed)
History of Present Illness  There is no documented history at this time  Assessments & Plan   There are no diagnoses linked to this encounter.    Additional instructions  Subjective:  Patient presents with venous ulcer of the Bilateral lower extremity.    Procedure:  3 layer unna wrap was placed Bilateral lower extremity.   Plan:   Follow up in one week.  

## 2022-10-02 ENCOUNTER — Other Ambulatory Visit: Payer: Self-pay

## 2022-10-02 ENCOUNTER — Emergency Department: Payer: Medicare HMO

## 2022-10-02 ENCOUNTER — Emergency Department
Admission: EM | Admit: 2022-10-02 | Discharge: 2022-10-02 | Disposition: A | Discharge status: Home / Self Care | Payer: Medicare HMO | Attending: Emergency Medicine | Admitting: Emergency Medicine

## 2022-10-02 DIAGNOSIS — R112 Nausea with vomiting, unspecified: Secondary | ICD-10-CM | POA: Diagnosis not present

## 2022-10-02 DIAGNOSIS — Z20822 Contact with and (suspected) exposure to covid-19: Secondary | ICD-10-CM | POA: Insufficient documentation

## 2022-10-02 DIAGNOSIS — N289 Disorder of kidney and ureter, unspecified: Secondary | ICD-10-CM | POA: Diagnosis not present

## 2022-10-02 DIAGNOSIS — I10 Essential (primary) hypertension: Secondary | ICD-10-CM | POA: Diagnosis not present

## 2022-10-02 DIAGNOSIS — E876 Hypokalemia: Secondary | ICD-10-CM | POA: Insufficient documentation

## 2022-10-02 DIAGNOSIS — R197 Diarrhea, unspecified: Secondary | ICD-10-CM | POA: Diagnosis not present

## 2022-10-02 DIAGNOSIS — K5730 Diverticulosis of large intestine without perforation or abscess without bleeding: Secondary | ICD-10-CM | POA: Diagnosis not present

## 2022-10-02 LAB — COMPREHENSIVE METABOLIC PANEL
ALT: 14 U/L (ref 0–44)
AST: 29 U/L (ref 15–41)
Albumin: 4.7 g/dL (ref 3.5–5.0)
Alkaline Phosphatase: 77 U/L (ref 38–126)
Anion gap: 13 (ref 5–15)
BUN: 18 mg/dL (ref 8–23)
CO2: 20 mmol/L — ABNORMAL LOW (ref 22–32)
Calcium: 9.9 mg/dL (ref 8.9–10.3)
Chloride: 104 mmol/L (ref 98–111)
Creatinine, Ser: 0.86 mg/dL (ref 0.44–1.00)
GFR, Estimated: >60 mL/min (ref >60)
Glucose, Bld: 154 mg/dL — ABNORMAL HIGH (ref 70–99)
Potassium: 3.3 mmol/L — ABNORMAL LOW (ref 3.5–5.1)
Sodium: 137 mmol/L (ref 135–145)
Total Bilirubin: 3.7 mg/dL — ABNORMAL HIGH (ref 0.3–1.2)
Total Protein: 8.3 g/dL — ABNORMAL HIGH (ref 6.5–8.1)

## 2022-10-02 LAB — CBC WITH DIFFERENTIAL/PLATELET
Abs Immature Granulocytes: 0.02 10*3/uL (ref 0.00–0.07)
Basophils Absolute: 0 10*3/uL (ref 0.0–0.1)
Basophils Relative: 0 %
Eosinophils Absolute: 0 10*3/uL (ref 0.0–0.5)
Eosinophils Relative: 0 %
HCT: 38.5 % (ref 36.0–46.0)
Hemoglobin: 13.2 g/dL (ref 12.0–15.0)
Immature Granulocytes: 0 %
Lymphocytes Relative: 31 %
Lymphs Abs: 2 10*3/uL (ref 0.7–4.0)
MCH: 30.3 pg (ref 26.0–34.0)
MCHC: 34.3 g/dL (ref 30.0–36.0)
MCV: 88.5 fL (ref 80.0–100.0)
Monocytes Absolute: 0.2 10*3/uL (ref 0.1–1.0)
Monocytes Relative: 3 %
Neutro Abs: 4.2 10*3/uL (ref 1.7–7.7)
Neutrophils Relative %: 66 %
Platelets: 210 10*3/uL (ref 150–400)
RBC: 4.35 MIL/uL (ref 3.87–5.11)
RDW: 13.3 % (ref 11.5–15.5)
WBC: 6.4 10*3/uL (ref 4.0–10.5)
nRBC: 0 % (ref 0.0–0.2)

## 2022-10-02 LAB — URINALYSIS, ROUTINE W REFLEX MICROSCOPIC
Bilirubin Urine: NEGATIVE
Glucose, UA: NEGATIVE mg/dL
Hgb urine dipstick: NEGATIVE
Ketones, ur: NEGATIVE mg/dL
Nitrite: NEGATIVE
Protein, ur: NEGATIVE mg/dL
Specific Gravity, Urine: 1.012 (ref 1.005–1.030)
pH: 5 (ref 5.0–8.0)

## 2022-10-02 LAB — SARS CORONAVIRUS 2 BY RT PCR: SARS Coronavirus 2 by RT PCR: NEGATIVE

## 2022-10-02 LAB — LIPASE, BLOOD: Lipase: 30 U/L (ref 11–51)

## 2022-10-02 MED ORDER — PANTOPRAZOLE SODIUM 20 MG PO TBEC: 20.0000 mg | DELAYED_RELEASE_TABLET | Freq: Every day | ORAL | 0 refills | Status: AC

## 2022-10-02 MED ORDER — IOHEXOL 9 MG/ML PO SOLN
1000.0000 mL | Freq: Once | ORAL | Status: AC | PRN
  Administered 2022-10-02: 1000 mL via ORAL

## 2022-10-02 MED ORDER — ONDANSETRON 4 MG PO TBDP: 4.0000 mg | ORAL_TABLET | Freq: Three times a day (TID) | ORAL | 0 refills | Status: DC | PRN

## 2022-10-02 MED ORDER — ACETAMINOPHEN 500 MG PO TABS
1000.0000 mg | ORAL_TABLET | Freq: Once | ORAL | Status: AC
  Administered 2022-10-02: 1000 mg via ORAL
  Filled 2022-10-02: qty 2

## 2022-10-02 MED ORDER — IOHEXOL 300 MG/ML  SOLN
100.0000 mL | Freq: Once | INTRAMUSCULAR | Status: AC | PRN
  Administered 2022-10-02: 100 mL via INTRAVENOUS

## 2022-10-02 MED ORDER — LACTATED RINGERS IV BOLUS
1000.0000 mL | Freq: Once | INTRAVENOUS | Status: AC
  Administered 2022-10-02: 1000 mL via INTRAVENOUS

## 2022-10-02 MED ORDER — ONDANSETRON 4 MG PO TBDP
4.0000 mg | ORAL_TABLET | Freq: Once | ORAL | Status: AC | PRN
  Administered 2022-10-02: 4 mg via ORAL
  Filled 2022-10-02: qty 1

## 2022-10-02 MED ORDER — ONDANSETRON HCL 4 MG/2ML IJ SOLN
4.0000 mg | Freq: Once | INTRAMUSCULAR | Status: AC
  Administered 2022-10-02: 4 mg via INTRAVENOUS
  Filled 2022-10-02: qty 2

## 2022-10-02 NOTE — ED Notes (Signed)
Patient refused transport back to home via [employer]. Patient was on the phone with daughter and daughter stated her husband was coming to pick the patient up as he was getting off. Patient refused to stay in ER room and wait for son in law and stormed off to wait outside ER entrance. This RN, Consulting civil engineer, Therapist, nutritional all attempted to get patient to wait in ER room for son in law. Staff also tried talking to daughter and making her aware of potential danger patient could be placed in while sitting outside ER doors. This RN and charge RN observed patient getting into blue chevy 4x4 pickup truck with middle age white female.

## 2022-10-02 NOTE — ED Notes (Addendum)
This Special educational needs teacher is working on transport for patient back to home. Patient daughter called and unable to pick patient up. Daughter stated that she nor her husband has a car. Patient has a son but he is located in [place], [place] and daughter reports he will not drive 2 hours to pick up patient.

## 2022-10-02 NOTE — ED Provider Notes (Signed)
[facility] Provider Note    Event Date/Time   First MD Initiated Contact with Patient 10/02/22 1526     (approximate)   History   Abdominal Pain and Emesis   HPI Morgan MAYERSON is a 72 y.o. 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 -s/p 2021 venting gastrostomy tube (now removed) who presents with nausea and vomiting.  Symptoms have been going on for about a week.  Patient has no appetite or satiety and when she tries to eat she vomits.  She feels weak and fatigued.  Denies significant abdominal pain.  Has had small-volume diarrhea but less output than normal.  No fevers chills or urinary symptoms.  Patient tells me that she had similar nausea and vomiting several years ago that was worked up and they did not know why.    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 --      Excl. in GC? --     Most recent vital signs: Vitals:   10/02/22 1920 10/02/22 1925  BP: (!) 172/88   Pulse: 62   Resp: 16   Temp:  98.1 F (36.7 C)  SpO2: 100%      General: Awake, no distress.  Mucous membranes are dry CV:  Good peripheral perfusion.  Resp:  Normal effort.  Abd:  No distention.  Mild tenderness palpation the right upper quadrant but no significant guarding abdomen soft Neuro:             Awake, Alert, Oriented x 3  Other:     ED Results / Procedures / Treatments  Labs (all labs ordered are listed, but only abnormal results are displayed) Labs Reviewed  COMPREHENSIVE METABOLIC PANEL - Abnormal; Notable for the following components:      Result Value   Potassium 3.3 (*)    CO2 20 (*)    Glucose, Bld 154 (*)    Total Protein 8.3 (*)    Total Bilirubin 3.7 (*)  All other components within normal limits  URINALYSIS, ROUTINE W REFLEX MICROSCOPIC - Abnormal; Notable for the following components:   Color, Urine YELLOW (*)    APPearance HAZY (*)    Leukocytes,Ua SMALL (*)    Bacteria, UA RARE (*)    All other components within normal limits  SARS CORONAVIRUS 2 BY RT PCR  CBC WITH DIFFERENTIAL/PLATELET  LIPASE, BLOOD     EKG  EKG interpretation performed by myself: NSR, nml axis, nml intervals, no acute ischemic changes    RADIOLOGY  I reviewed interpreted patient's CT of the abdomen pelvis which is not showing small bowel obstruction  PROCEDURES:  Critical Care performed: No  Procedures  The patient is on the cardiac monitor to evaluate for evidence of arrhythmia and/or significant heart rate changes.   MEDICATIONS ORDERED IN ED: Medications  ondansetron (ZOFRAN-ODT) disintegrating tablet 4 mg (4 mg Oral Given 10/02/22 1400)  lactated ringers bolus 1,000 mL (0 mLs Intravenous Stopped 10/02/22 2024)  ondansetron (ZOFRAN) injection 4 mg (4 mg  Intravenous Given 10/02/22 1608)  iohexol (OMNIPAQUE) 300 MG/ML solution 100 mL (100 mLs Intravenous Contrast Given 10/02/22 1826)  iohexol (OMNIPAQUE) 9 MG/ML oral solution 1,000 mL (1,000 mLs Oral Contrast Given 10/02/22 1621)  acetaminophen (TYLENOL) tablet 1,000 mg (1,000 mg Oral Given 10/02/22 1933)     IMPRESSION / MDM / ASSESSMENT AND PLAN / ED COURSE  I reviewed the triage vital signs and the nursing notes.                              Patient's presentation is most consistent with acute presentation with potential threat to life or bodily function.  Differential diagnosis includes, but is not limited to, small bowel obstruction, internal hernia, gastric outlet obstruction, gastric esophageal stricture, gastroparesis  Patient is a 72 year old female with history of Roux-en-Y gastric bypass who presents with a week of decreased appetite nausea vomiting.  For about a week has not had appetite and has had recurrent episodes of emesis.  Tried to eat twice today but vomited both times.  She has been having bowel movements is a small volumes of diarrhea and she is denying any significant abdominal pain.  No fevers.  Patient is hypertensive on arrival vital signs are otherwise reassuring.  On exam she looks somewhat fatigued but nontoxic is dry mucous membranes.  She has mild tenderness in the right upper quadrant but exam is overall nonperitoneal.  On her complex surgical history we will obtain a CT of the abdomen pelvis with oral and IV contrast.  Will give a bolus of fluid and Zofran.   Patient's labs are overall reassuring she is mildly hypokalemic potassium 3.3 bicarb is 20 but she has a normal anion gap.  T. bili is 3.7 but otherwise normal LFTs.  CT of the abdomen pelvis with IV and oral contrast is overall reassuring she does have a fluid level in the distal stomach which could indicate dysmotility or reflux but there is no high-grade obstruction and no stricture.  Reassessment patient  is feeling improved.  She is tolerating p.o.  Does not look like patient is on a PPI and given she does likely have reflux we will start her on pantoprazole and Zofran prescription.  Encouraged GI follow-up for likely endoscopy if symptoms continue.      FINAL CLINICAL IMPRESSION(S) / ED DIAGNOSES   Final diagnoses:  Nausea and vomiting, unspecified vomiting type  Rx / DC Orders   ED Discharge Orders          Ordered    pantoprazole (PROTONIX) 20 MG tablet  Daily        10/02/22 1945    ondansetron (ZOFRAN-ODT) 4 MG disintegrating tablet  Every 8 hours PRN        10/02/22 1945             Note:  This document was prepared using Dragon voice recognition software and may include unintentional dictation errors.   Georga Hacking, MD 10/02/22 2259

## 2022-10-02 NOTE — ED Triage Notes (Signed)
C/o N/V/D, abd pain x5 days. Denies fever. Lives in [place]. PMH: Gastric bypass x2 years ago

## 2022-10-02 NOTE — Discharge Instructions (Signed)
Your CAT scan and blood work are overall reassuring.  I suspect that you are having some reflux causing your nausea and vomiting.  Please start taking the pantoprazole daily take this 30 minutes before a meal for maximal effect.  You can also take the Zofran as needed for nausea and vomiting.  I would like you to follow-up with a gastroenterologist.  Please call to schedule an appointment above.

## 2022-10-02 NOTE — ED Provider Triage Note (Signed)
Emergency Medicine Provider Triage Evaluation Note  Morgan Dixon , a 72 y.o. female  was evaluated in triage.  Pt complains of abdominal pain, n/v/d x 5 days.  [place] [facility] [place].    Review of Systems  Positive: N/V/D Negative: Denies urinary sx  Physical Exam  Temp 97.9 F (36.6 C) (Oral)  Gen:   Awake, no distress   Resp:  Normal effort  MSK:   Wheelchair.  Able to move upper extremities.   Other:    Medical Decision Making  Medically screening exam initiated at 1:52 PM.  Appropriate orders placed.  Morgan Dixon was informed that the remainder of the evaluation will be completed by another provider, this initial triage assessment does not replace that evaluation, and the importance of remaining in the ED until their evaluation is complete.     Tommi Rumps, PA-C 10/02/22 1355

## 2022-10-03 ENCOUNTER — Encounter (INDEPENDENT_AMBULATORY_CARE_PROVIDER_SITE_OTHER): Payer: Medicare HMO

## 2022-10-03 ENCOUNTER — Ambulatory Visit (INDEPENDENT_AMBULATORY_CARE_PROVIDER_SITE_OTHER): Payer: Medicare HMO | Admitting: Nurse Practitioner

## 2022-10-09 ENCOUNTER — Telehealth: Payer: Self-pay

## 2022-10-09 NOTE — Telephone Encounter (Signed)
Transition Care Management Follow-up Telephone Call Date of discharge and from where: 10/02/2022 [facility] How have you been since you were released from the hospital? Patient is feeling much better Any questions or concerns? No  Items Reviewed: Did the pt receive and understand the discharge instructions provided? Yes  Medications obtained and verified? Yes  Other? No  Any new allergies since your discharge? No  Dietary orders reviewed? Yes Do you have support at home? Yes   Follow up appointments reviewed:  PCP Hospital f/u appt confirmed? No  Scheduled to see  on  @ . Specialist Hospital f/u appt confirmed?  Patient plans to make an appointment with Wyline Mood, MD to follow up.  Scheduled to see  on  @ . Are transportation arrangements needed? No  If their condition worsens, is the pt aware to call PCP or go to the Emergency Dept.? Yes Was the patient provided with contact information for the PCP's office or ED? Yes Was to pt encouraged to call back with questions or concerns? Yes  Blayke Pinera [facility]  [facility] Community Resource Care Guide   [email or web address]  ?? [phone]   Website: [email or web address]  [email or web address]

## 2022-10-17 ENCOUNTER — Ambulatory Visit (INDEPENDENT_AMBULATORY_CARE_PROVIDER_SITE_OTHER): Payer: Medicare HMO

## 2022-10-17 DIAGNOSIS — I739 Peripheral vascular disease, unspecified: Secondary | ICD-10-CM

## 2022-10-17 DIAGNOSIS — I890 Lymphedema, not elsewhere classified: Secondary | ICD-10-CM | POA: Diagnosis not present

## 2022-10-17 LAB — VAS US ABI WITH/WO TBI
Left ABI: 1.12
Right ABI: 1.25

## 2022-10-22 ENCOUNTER — Ambulatory Visit (INDEPENDENT_AMBULATORY_CARE_PROVIDER_SITE_OTHER): Payer: Medicare HMO | Admitting: Nurse Practitioner

## 2022-11-27 ENCOUNTER — Encounter (INDEPENDENT_AMBULATORY_CARE_PROVIDER_SITE_OTHER): Payer: Medicare HMO

## 2022-11-27 ENCOUNTER — Ambulatory Visit (INDEPENDENT_AMBULATORY_CARE_PROVIDER_SITE_OTHER): Payer: Medicare HMO | Admitting: Vascular Surgery

## 2022-11-27 ENCOUNTER — Encounter (INDEPENDENT_AMBULATORY_CARE_PROVIDER_SITE_OTHER): Payer: Self-pay | Admitting: Vascular Surgery

## 2022-11-27 VITALS — BP 143/65 | HR 60 | Resp 16 | Wt 260.0 lb

## 2022-11-27 DIAGNOSIS — I890 Lymphedema, not elsewhere classified: Secondary | ICD-10-CM

## 2022-11-27 DIAGNOSIS — I639 Cerebral infarction, unspecified: Secondary | ICD-10-CM | POA: Diagnosis not present

## 2022-11-27 DIAGNOSIS — L97221 Non-pressure chronic ulcer of left calf limited to breakdown of skin: Secondary | ICD-10-CM | POA: Diagnosis not present

## 2022-11-27 DIAGNOSIS — I10 Essential (primary) hypertension: Secondary | ICD-10-CM

## 2022-11-27 NOTE — Assessment & Plan Note (Signed)
The patient has recurrent ulcerations on the left lower leg and preulcerative lesion on the right lower leg.  These do not appear infected, but given her marked swelling we need to get her back in Unna boots today to get the ulceration healed and get her swelling under control.  Bilateral 3 layer Unna boots were placed today and will be changed weekly.  Reassess in 4 weeks.

## 2022-11-27 NOTE — Progress Notes (Signed)
MRN : [id number]  Morgan Dixon is a 72 y.o. (May 18, 1950) female who presents with chief complaint of  Chief Complaint  Patient presents with   Follow-up    Unna and ultrasound follow up  .  History of Present Illness: Patient returns today in follow up of her leg swelling.  This is gotten significantly worse and she now has new ulcerations worse on the left leg.  The left leg is more swollen.  She was in Unna boots up until about 6 to 8 weeks ago.  Elevation and compression have not kept her swelling under control.  No fevers or chills.  No signs of systemic infection  Current Outpatient Medications  Medication Sig Dispense Refill   acetaminophen (TYLENOL) 500 MG tablet Take 2 tablets (1,000 mg total) by mouth every 6 (six) hours as needed for moderate pain. 30 tablet 0   buprenorphine (SUBUTEX) 8 MG SUBL SL tablet Place 8 mg under the tongue 2 (two) times daily.     clopidogrel (PLAVIX) 75 MG tablet Take 75 mg by mouth daily.     hydrochlorothiazide (HYDRODIURIL) 25 MG tablet Take 25 mg by mouth daily.     losartan (COZAAR) 100 MG tablet Take 1 tablet (100 mg total) by mouth daily. 30 tablet 1   ondansetron (ZOFRAN-ODT) 4 MG disintegrating tablet Take 1 tablet (4 mg total) by mouth every 8 (eight) hours as needed. 20 tablet 0   pregabalin (LYRICA) 100 MG capsule Take 200 mg by mouth daily.     sertraline (ZOLOFT) 100 MG tablet Take 100 mg by mouth at bedtime.     tiZANidine (ZANAFLEX) 4 MG tablet Take 4 mg by mouth 2 (two) times daily.     atorvastatin (LIPITOR) 40 MG tablet Take 1 tablet (40 mg total) by mouth daily. 30 tablet 2   No current facility-administered medications for this visit.    Past Medical History:  Diagnosis Date   Chronic pain    Edema extremities     Past Surgical History:  Procedure Laterality Date   ABDOMINAL HYSTERECTOMY  1989   BACK SURGERY     GASTRIC BYPASS     ROBOTIC ASSISTED LAPAROSCOPIC LYSIS OF ADHESION N/A 12/30/2021   Procedure: XI  ROBOTIC ASSISTED LAPAROSCOPIC LYSIS OF ADHESION;  Surgeon: Campbell Lerner, MD;  Location: [facility] ORS;  Service: General;  Laterality: N/A;   XI ROBOTIC ASSISTED VENTRAL HERNIA N/A 12/30/2021   Procedure: XI ROBOTIC ASSISTED VENTRAL HERNIA;  Surgeon: Campbell Lerner, MD;  Location: [facility] ORS;  Service: General;  Laterality: N/A;     Social History   Tobacco Use   Smoking status: Never   Smokeless tobacco: Never  Vaping Use   Vaping status: Never Used  Substance Use Topics   Alcohol use: Never   Drug use: Never      Family History  Problem Relation Age of Onset   Breast cancer Maternal Aunt 60   Diabetes Father    Stroke Father      Allergies  Allergen Reactions   Bupropion Nausea And Vomiting   Diazepam Rash   Penicillins Itching   Rofecoxib Swelling   Latex Itching    When used gloves in the past   Metformin Other (See Comments)   Prozac [Fluoxetine Hcl] Itching and Rash   Zinc Oxide [[place]] Rash      REVIEW OF SYSTEMS (Negative unless checked)   Constitutional: [] Weight loss  [] Fever  [] Chills Cardiac: [] Chest pain   [] Chest pressure   []   Palpitations   [] Shortness of breath when laying flat   [] Shortness of breath at rest   [] Shortness of breath with exertion. Vascular:  [] Pain in legs with walking   [] Pain in legs at rest   [] Pain in legs when laying flat   [] Claudication   [] Pain in feet when walking  [] Pain in feet at rest  [] Pain in feet when laying flat   [] History of DVT   [] Phlebitis   [x] Swelling in legs   [] Varicose veins   [x] Non-healing ulcers Pulmonary:   [] Uses home oxygen   [] Productive cough   [] Hemoptysis   [] Wheeze  [] COPD   [] Asthma Neurologic:  [] Dizziness  [] Blackouts   [] Seizures   [] History of stroke   [] History of TIA  [] Aphasia   [] Temporary blindness   [] Dysphagia   [] Weakness or numbness in arms   [] Weakness or numbness in legs Musculoskeletal:  [x] Arthritis   [] Joint swelling   [x] Joint pain   [] Low back pain Hematologic:  [] Easy  bruising  [] Easy bleeding   [] Hypercoagulable state   [] Anemic  [] Hepatitis Gastrointestinal:  [] Blood in stool   [] Vomiting blood  [] Gastroesophageal reflux/heartburn   [] Abdominal pain Genitourinary:  [] Chronic kidney disease   [] Difficult urination  [] Frequent urination  [] Burning with urination   [] Hematuria Skin:  [] Rashes   [x] Ulcers   [x] Wounds Psychological:  [] History of anxiety   []  History of major depression.  Physical Examination  BP (!) 143/65 (BP Location: Left Arm)   Pulse 60   Resp 16   Wt 260 lb (117.9 kg)   BMI 41.97 kg/m  Gen:  WD/WN, NAD. Obese  Head: [place]/AT, No temporalis wasting. Ear/Nose/Throat: Hearing grossly intact, nares w/o erythema or drainage Eyes: Conjunctiva clear. Sclera non-icteric Neck: Supple.  Trachea midline Pulmonary:  Good air movement, no use of accessory muscles.  Cardiac: RRR, no JVD Vascular:  Vessel Right Left  Radial Palpable Palpable                          PT Not Palpable Not Palpable  DP Not Palpable Not Palpable   Gastrointestinal: soft, non-tender/non-distended. No guarding/reflex.  Musculoskeletal: M/S 5/5 throughout.  No deformity or atrophy.  Small scab on the right anterior ankle area.  Ulceration is present with a subcentimeter superficial ulceration on the anterior left lower leg and about a 3 to 4 cm irregular ulceration on the posterior left leg.  2+ right lower extremity edema, 3+ left lower extremity edema. Neurologic: Sensation grossly intact in extremities.  Symmetrical.  Speech is fluent.  Psychiatric: Judgment intact, Mood & affect appropriate for pt's clinical situation. Dermatologic: left calf ulcerations as above      Labs Recent Results (from the past 2160 hour(s))  SARS Coronavirus 2 by RT PCR (hospital order, performed in [facility] hospital lab) *cepheid single result test* Anterior Nasal Swab     Status: None   Collection Time: 10/02/22  1:56 PM   Specimen: Anterior Nasal Swab  Result Value Ref  Range   SARS Coronavirus 2 by RT PCR NEGATIVE NEGATIVE    Comment: (NOTE) SARS-CoV-2 target nucleic acids are NOT DETECTED.  The SARS-CoV-2 RNA is generally detectable in upper and lower respiratory specimens during the acute phase of infection. The lowest concentration of SARS-CoV-2 viral copies this assay can detect is 250 copies / mL. A negative result does not preclude SARS-CoV-2 infection and should not be used as the sole basis for treatment or other patient management decisions.  A negative result may occur with improper specimen collection / handling, submission of specimen other than nasopharyngeal swab, presence of viral mutation(s) within the areas targeted by this assay, and inadequate number of viral copies (<250 copies / mL). A negative result must be combined with clinical observations, patient history, and epidemiological information.  Fact Sheet for Patients:   RoadLapTop.co.za  Fact Sheet for Healthcare Providers: http://kim-miller.com/  This test is not yet approved or  cleared by the [place] FDA and has been authorized for detection and/or diagnosis of SARS-CoV-2 by FDA under an Emergency Use Authorization (EUA).  This EUA will remain in effect (meaning this test can be used) for the duration of the COVID-19 declaration under Section 564(b)(1) of the Act, 21 U.S.C. section 360bbb-3(b)(1), unless the authorization is terminated or revoked sooner.  Performed at [facility], [address] [place], [place] [address]   CBC with Differential     Status: None   Collection Time: 10/02/22  1:56 PM  Result Value Ref Range   WBC 6.4 4.0 - 10.5 K/uL   RBC 4.35 3.87 - 5.11 MIL/uL   Hemoglobin 13.2 12.0 - 15.0 g/dL   HCT 29.5 28.4 - 13.2 %   MCV 88.5 80.0 - 100.0 fL   MCH 30.3 26.0 - 34.0 pg   MCHC 34.3 30.0 - 36.0 g/dL   RDW 44.0 10.2 - 72.5 %   Platelets 210 150 - 400 K/uL   nRBC 0.0 0.0 - 0.2 %    Neutrophils Relative % 66 %   Neutro Abs 4.2 1.7 - 7.7 K/uL   Lymphocytes Relative 31 %   Lymphs Abs 2.0 0.7 - 4.0 K/uL   Monocytes Relative 3 %   Monocytes Absolute 0.2 0.1 - 1.0 K/uL   Eosinophils Relative 0 %   Eosinophils Absolute 0.0 0.0 - 0.5 K/uL   Basophils Relative 0 %   Basophils Absolute 0.0 0.0 - 0.1 K/uL   Immature Granulocytes 0 %   Abs Immature Granulocytes 0.02 0.00 - 0.07 K/uL    Comment: Performed at [facility], [address] [place], [place] [address]  Comprehensive metabolic panel     Status: Abnormal   Collection Time: 10/02/22  1:56 PM  Result Value Ref Range   Sodium 137 135 - 145 mmol/L   Potassium 3.3 (L) 3.5 - 5.1 mmol/L   Chloride 104 98 - 111 mmol/L   CO2 20 (L) 22 - 32 mmol/L   Glucose, Bld 154 (H) 70 - 99 mg/dL    Comment: Glucose reference range applies only to samples taken after fasting for at least 8 hours.   BUN 18 8 - 23 mg/dL   Creatinine, Ser 0.34 0.44 - 1.00 mg/dL   Calcium 9.9 8.9 - 74.2 mg/dL   Total Protein 8.3 (H) 6.5 - 8.1 g/dL   Albumin 4.7 3.5 - 5.0 g/dL   AST 29 15 - 41 U/L   ALT 14 0 - 44 U/L   Alkaline Phosphatase 77 38 - 126 U/L   Total Bilirubin 3.7 (H) 0.3 - 1.2 mg/dL   GFR, Estimated >59 >56 mL/min    Comment: (NOTE) Calculated using the CKD-EPI Creatinine Equation (2021)    Anion gap 13 5 - 15    Comment: Performed at [facility], [address] [place], [place] [address]  Lipase, blood     Status: None   Collection Time: 10/02/22  1:56 PM  Result Value Ref Range   Lipase 30 11 -  51 U/L    Comment: Performed at [facility], [address] [place], [place] [address]  Urinalysis, Routine w reflex microscopic -Urine, Clean Catch     Status: Abnormal   Collection Time: 10/02/22  5:52 PM  Result Value Ref Range   Color, Urine YELLOW (A) YELLOW   APPearance HAZY (A) CLEAR   Specific Gravity, Urine 1.012 1.005 - 1.030   pH 5.0 5.0 - 8.0   Glucose, UA NEGATIVE NEGATIVE mg/dL   Hgb  urine dipstick NEGATIVE NEGATIVE   Bilirubin Urine NEGATIVE NEGATIVE   Ketones, ur NEGATIVE NEGATIVE mg/dL   Protein, ur NEGATIVE NEGATIVE mg/dL   Nitrite NEGATIVE NEGATIVE   Leukocytes,Ua SMALL (A) NEGATIVE   RBC / HPF 0-5 0 - 5 RBC/hpf   WBC, UA 11-20 0 - 5 WBC/hpf   Bacteria, UA RARE (A) NONE SEEN   Squamous Epithelial / HPF 11-20 0 - 5 /HPF   Mucus PRESENT     Comment: Performed at [facility], [address] [place], [place] [address]  VAS [place] ABI WITH/WO TBI     Status: None   Collection Time: 10/17/22  2:20 PM  Result Value Ref Range   Right ABI 1.25    Left ABI 1.12     Radiology No results found.  Assessment/Plan Benign essential HTN .blood pressure control important in reducing the progression of atherosclerotic disease. On appropriate oral medications.     CVA (cerebral vascular accident) [facility]) With some residual expressive aphasia.  Lower limb ulcer, calf, left, limited to breakdown of skin (HCC) The patient has recurrent ulcerations on the left lower leg and preulcerative lesion on the right lower leg.  These do not appear infected, but given her marked swelling we need to get her back in Unna boots today to get the ulceration healed and get her swelling under control.  Bilateral 3 layer Unna boots were placed today and will be changed weekly.  Reassess in 4 weeks.  Lymphedema Swelling is significantly worse.  Previous arterial and venous studies were normal.  We are going to get her back in Unna boots today to get her ulcerations under control and get the swelling better.  This would be stage III lymphedema and she would clearly benefit from a lymphedema pump.    Festus Barren, MD  11/27/2022 3:02 PM    This note was created with Dragon medical transcription system.  Any errors from dictation are purely unintentional

## 2022-11-27 NOTE — Assessment & Plan Note (Signed)
Swelling is significantly worse.  Previous arterial and venous studies were normal.  We are going to get her back in Unna boots today to get her ulcerations under control and get the swelling better.  This would be stage III lymphedema and she would clearly benefit from a lymphedema pump.

## 2022-12-04 ENCOUNTER — Encounter (INDEPENDENT_AMBULATORY_CARE_PROVIDER_SITE_OTHER): Payer: Medicare HMO

## 2022-12-11 ENCOUNTER — Encounter (INDEPENDENT_AMBULATORY_CARE_PROVIDER_SITE_OTHER): Payer: Medicare HMO

## 2022-12-18 ENCOUNTER — Encounter (INDEPENDENT_AMBULATORY_CARE_PROVIDER_SITE_OTHER): Payer: Medicare HMO

## 2022-12-20 ENCOUNTER — Telehealth (INDEPENDENT_AMBULATORY_CARE_PROVIDER_SITE_OTHER): Payer: Self-pay

## 2022-12-20 NOTE — Telephone Encounter (Signed)
Patient left a message requesting for new prescription for compression. She stated that she lost the previous prescription. I will mail rx to patient address as requested.

## 2022-12-25 ENCOUNTER — Encounter (INDEPENDENT_AMBULATORY_CARE_PROVIDER_SITE_OTHER): Payer: Medicare HMO | Admitting: Vascular Surgery

## 2023-02-28 ENCOUNTER — Other Ambulatory Visit: Payer: Medicare HMO

## 2023-02-28 ENCOUNTER — Inpatient Hospital Stay
Admission: EM | Admit: 2023-02-28 | Discharge: 2023-03-07 | DRG: 064 | Disposition: A | Discharge status: Hospice | Payer: Medicare HMO | Source: Other Acute Inpatient Hospital | Attending: Internal Medicine | Admitting: Internal Medicine

## 2023-02-28 ENCOUNTER — Emergency Department: Payer: Medicare HMO

## 2023-02-28 ENCOUNTER — Other Ambulatory Visit: Payer: Self-pay

## 2023-02-28 ENCOUNTER — Encounter: Payer: Self-pay | Admitting: Emergency Medicine

## 2023-02-28 DIAGNOSIS — I739 Peripheral vascular disease, unspecified: Secondary | ICD-10-CM | POA: Diagnosis not present

## 2023-02-28 DIAGNOSIS — G8104 Flaccid hemiplegia affecting left nondominant side: Secondary | ICD-10-CM | POA: Diagnosis not present

## 2023-02-28 DIAGNOSIS — Z9682 Presence of neurostimulator: Secondary | ICD-10-CM

## 2023-02-28 DIAGNOSIS — R4182 Altered mental status, unspecified: Secondary | ICD-10-CM | POA: Diagnosis not present

## 2023-02-28 DIAGNOSIS — R471 Dysarthria and anarthria: Secondary | ICD-10-CM | POA: Diagnosis present

## 2023-02-28 DIAGNOSIS — R404 Transient alteration of awareness: Secondary | ICD-10-CM | POA: Diagnosis not present

## 2023-02-28 DIAGNOSIS — I10 Essential (primary) hypertension: Secondary | ICD-10-CM | POA: Diagnosis not present

## 2023-02-28 DIAGNOSIS — R001 Bradycardia, unspecified: Secondary | ICD-10-CM | POA: Diagnosis not present

## 2023-02-28 DIAGNOSIS — R4702 Dysphasia: Secondary | ICD-10-CM | POA: Diagnosis present

## 2023-02-28 DIAGNOSIS — Z9884 Bariatric surgery status: Secondary | ICD-10-CM

## 2023-02-28 DIAGNOSIS — Z91040 Latex allergy status: Secondary | ICD-10-CM

## 2023-02-28 DIAGNOSIS — R4701 Aphasia: Secondary | ICD-10-CM | POA: Diagnosis present

## 2023-02-28 DIAGNOSIS — E66812 Obesity, class 2: Secondary | ICD-10-CM | POA: Diagnosis present

## 2023-02-28 DIAGNOSIS — I63031 Cerebral infarction due to thrombosis of right carotid artery: Principal | ICD-10-CM | POA: Diagnosis present

## 2023-02-28 DIAGNOSIS — R29810 Facial weakness: Secondary | ICD-10-CM | POA: Diagnosis present

## 2023-02-28 DIAGNOSIS — Z79891 Long term (current) use of opiate analgesic: Secondary | ICD-10-CM

## 2023-02-28 DIAGNOSIS — I272 Pulmonary hypertension, unspecified: Secondary | ICD-10-CM | POA: Diagnosis not present

## 2023-02-28 DIAGNOSIS — I890 Lymphedema, not elsewhere classified: Secondary | ICD-10-CM | POA: Diagnosis present

## 2023-02-28 DIAGNOSIS — Z823 Family history of stroke: Secondary | ICD-10-CM

## 2023-02-28 DIAGNOSIS — E785 Hyperlipidemia, unspecified: Secondary | ICD-10-CM | POA: Diagnosis present

## 2023-02-28 DIAGNOSIS — R414 Neurologic neglect syndrome: Secondary | ICD-10-CM | POA: Diagnosis present

## 2023-02-28 DIAGNOSIS — I63231 Cerebral infarction due to unspecified occlusion or stenosis of right carotid arteries: Secondary | ICD-10-CM | POA: Diagnosis not present

## 2023-02-28 DIAGNOSIS — G9349 Other encephalopathy: Secondary | ICD-10-CM | POA: Diagnosis present

## 2023-02-28 DIAGNOSIS — G936 Cerebral edema: Secondary | ICD-10-CM | POA: Diagnosis present

## 2023-02-28 DIAGNOSIS — F419 Anxiety disorder, unspecified: Secondary | ICD-10-CM | POA: Diagnosis present

## 2023-02-28 DIAGNOSIS — W19XXXA Unspecified fall, initial encounter: Secondary | ICD-10-CM | POA: Diagnosis present

## 2023-02-28 DIAGNOSIS — I6389 Other cerebral infarction: Secondary | ICD-10-CM | POA: Diagnosis not present

## 2023-02-28 DIAGNOSIS — Z79899 Other long term (current) drug therapy: Secondary | ICD-10-CM | POA: Diagnosis not present

## 2023-02-28 DIAGNOSIS — I639 Cerebral infarction, unspecified: Secondary | ICD-10-CM | POA: Diagnosis not present

## 2023-02-28 DIAGNOSIS — I63511 Cerebral infarction due to unspecified occlusion or stenosis of right middle cerebral artery: Secondary | ICD-10-CM | POA: Diagnosis not present

## 2023-02-28 DIAGNOSIS — E876 Hypokalemia: Secondary | ICD-10-CM | POA: Diagnosis not present

## 2023-02-28 DIAGNOSIS — Z515 Encounter for palliative care: Secondary | ICD-10-CM | POA: Diagnosis not present

## 2023-02-28 DIAGNOSIS — G8929 Other chronic pain: Secondary | ICD-10-CM | POA: Diagnosis present

## 2023-02-28 DIAGNOSIS — Z6838 Body mass index (BMI) 38.0-38.9, adult: Secondary | ICD-10-CM | POA: Diagnosis not present

## 2023-02-28 DIAGNOSIS — R29722 NIHSS score 22: Secondary | ICD-10-CM | POA: Diagnosis not present

## 2023-02-28 DIAGNOSIS — R930 Abnormal findings on diagnostic imaging of skull and head, not elsewhere classified: Secondary | ICD-10-CM | POA: Diagnosis not present

## 2023-02-28 DIAGNOSIS — Z90710 Acquired absence of both cervix and uterus: Secondary | ICD-10-CM | POA: Diagnosis not present

## 2023-02-28 DIAGNOSIS — Z880 Allergy status to penicillin: Secondary | ICD-10-CM

## 2023-02-28 DIAGNOSIS — R531 Weakness: Secondary | ICD-10-CM | POA: Diagnosis not present

## 2023-02-28 DIAGNOSIS — Z66 Do not resuscitate: Secondary | ICD-10-CM | POA: Diagnosis present

## 2023-02-28 DIAGNOSIS — Z888 Allergy status to other drugs, medicaments and biological substances status: Secondary | ICD-10-CM

## 2023-02-28 DIAGNOSIS — Z8673 Personal history of transient ischemic attack (TIA), and cerebral infarction without residual deficits: Secondary | ICD-10-CM

## 2023-02-28 DIAGNOSIS — Z7902 Long term (current) use of antithrombotics/antiplatelets: Secondary | ICD-10-CM

## 2023-02-28 DIAGNOSIS — G9389 Other specified disorders of brain: Secondary | ICD-10-CM | POA: Diagnosis not present

## 2023-02-28 LAB — CBC WITH DIFFERENTIAL/PLATELET
Abs Immature Granulocytes: 0.03 10*3/uL (ref 0.00–0.07)
Basophils Absolute: 0 10*3/uL (ref 0.0–0.1)
Basophils Relative: 0 %
Eosinophils Absolute: 0 10*3/uL (ref 0.0–0.5)
Eosinophils Relative: 0 %
HCT: 39.5 % (ref 36.0–46.0)
Hemoglobin: 13.1 g/dL (ref 12.0–15.0)
Immature Granulocytes: 0 %
Lymphocytes Relative: 9 %
Lymphs Abs: 0.9 10*3/uL (ref 0.7–4.0)
MCH: 31.2 pg (ref 26.0–34.0)
MCHC: 33.2 g/dL (ref 30.0–36.0)
MCV: 94 fL (ref 80.0–100.0)
Monocytes Absolute: 0.4 10*3/uL (ref 0.1–1.0)
Monocytes Relative: 4 %
Neutro Abs: 8.8 10*3/uL — ABNORMAL HIGH (ref 1.7–7.7)
Neutrophils Relative %: 87 %
Platelets: 184 10*3/uL (ref 150–400)
RBC: 4.2 MIL/uL (ref 3.87–5.11)
RDW: 13.1 % (ref 11.5–15.5)
WBC: 10.1 10*3/uL (ref 4.0–10.5)
nRBC: 0 % (ref 0.0–0.2)

## 2023-02-28 LAB — MRSA NEXT GEN BY PCR, NASAL: MRSA by PCR Next Gen: NOT DETECTED

## 2023-02-28 LAB — CBG MONITORING, ED: Glucose-Capillary: 148 mg/dL — ABNORMAL HIGH (ref 70–99)

## 2023-02-28 LAB — GLUCOSE, CAPILLARY
Glucose-Capillary: 123 mg/dL — ABNORMAL HIGH (ref 70–99)
Glucose-Capillary: 133 mg/dL — ABNORMAL HIGH (ref 70–99)

## 2023-02-28 LAB — COMPREHENSIVE METABOLIC PANEL
ALT: 17 U/L (ref 0–44)
AST: 33 U/L (ref 15–41)
Albumin: 4.3 g/dL (ref 3.5–5.0)
Alkaline Phosphatase: 68 U/L (ref 38–126)
Anion gap: 9 (ref 5–15)
BUN: 22 mg/dL (ref 8–23)
CO2: 23 mmol/L (ref 22–32)
Calcium: 9.4 mg/dL (ref 8.9–10.3)
Chloride: 108 mmol/L (ref 98–111)
Creatinine, Ser: 0.93 mg/dL (ref 0.44–1.00)
GFR, Estimated: >60 mL/min (ref >60)
Glucose, Bld: 164 mg/dL — ABNORMAL HIGH (ref 70–99)
Potassium: 3.9 mmol/L (ref 3.5–5.1)
Sodium: 140 mmol/L (ref 135–145)
Total Bilirubin: 2.2 mg/dL — ABNORMAL HIGH (ref <1.2)
Total Protein: 7.7 g/dL (ref 6.5–8.1)

## 2023-02-28 LAB — BASIC METABOLIC PANEL
Anion gap: 10 (ref 5–15)
BUN: 22 mg/dL (ref 8–23)
CO2: 23 mmol/L (ref 22–32)
Calcium: 9.3 mg/dL (ref 8.9–10.3)
Chloride: 110 mmol/L (ref 98–111)
Creatinine, Ser: 0.9 mg/dL (ref 0.44–1.00)
GFR, Estimated: >60 mL/min (ref >60)
Glucose, Bld: 128 mg/dL — ABNORMAL HIGH (ref 70–99)
Potassium: 3.6 mmol/L (ref 3.5–5.1)
Sodium: 143 mmol/L (ref 135–145)

## 2023-02-28 LAB — PROTIME-INR
INR: 1.2 (ref 0.8–1.2)
Prothrombin Time: 15.4 s — ABNORMAL HIGH (ref 11.4–15.2)

## 2023-02-28 LAB — APTT: aPTT: 26 s (ref 24–36)

## 2023-02-28 LAB — ETHANOL: Alcohol, Ethyl (B): <10 mg/dL (ref <10)

## 2023-02-28 LAB — I-STAT CREATININE, ED: Creatinine, Ser: 1 mg/dL (ref 0.44–1.00)

## 2023-02-28 LAB — OSMOLALITY: Osmolality: 314 mosm/kg — ABNORMAL HIGH (ref 275–295)

## 2023-02-28 LAB — SODIUM: Sodium: 147 mmol/L — ABNORMAL HIGH (ref 135–145)

## 2023-02-28 MED ORDER — STROKE: EARLY STAGES OF RECOVERY BOOK: Freq: Once | Status: AC

## 2023-02-28 MED ORDER — SODIUM CHLORIDE 3 % IV SOLN
INTRAVENOUS | Status: DC
  Filled 2023-02-28 (×5): qty 500

## 2023-02-28 MED ORDER — IOHEXOL 350 MG/ML SOLN
75.0000 mL | Freq: Once | INTRAVENOUS | Status: AC | PRN
  Administered 2023-02-28: 75 mL via INTRAVENOUS

## 2023-02-28 MED ORDER — SODIUM CHLORIDE 3 % IV BOLUS
250.0000 mL | Freq: Once | INTRAVENOUS | Status: AC
  Administered 2023-02-28: 250 mL via INTRAVENOUS
  Filled 2023-02-28: qty 500

## 2023-02-28 NOTE — H&P (Signed)
NAME:  Morgan Dixon, MRN:  [id number], DOB:  09-30-1950, LOS: 0 ADMISSION DATE:  02/28/2023, CONSULTATION DATE: 02/28/2023 REFERRING MD: Dr. 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  She presented to [facility] ER via [facility] EMS notified for a fall.  However, when EMS arrived on the scene pt noted to have altered mental status, slurred speech, and left sided neglect with right sided deviation.  Her last known well time was on 11/13 at 1700.  ED Course Upon arrival to the ER Code Stroke activated at 1510 tele-neurologist paged.  Per ER notes pt able to verbalize her name, but otherwise inconsistent with hx.  Pt with left facial droop, left hemiparesis with right-sided deviation, and aphasia.  Initial CT Head revealed extensive hypodensity in the right MCA territory, likely cytotoxic edema from acute infarct, with 4 mm right-to-left midline shift along with petechial hemorrhage, no evidence of right hemorrhagic transformation, possible thrombus in the right ICA.  CTA Head/Neck confirmed thrombus in the right ICA with extension into M1.  Per neuro pt outside the window for surgical interventions for thrombus.  While in the ER pt developed worsening lethargy repeat CT Head revealed worsening midline shift.  Per neurology hypertonic saline ordered.  PCCM team contact for ICU admission.   Initial EKG: Sinus bradycardia/heart rate 46, no ST elevation  Significant lab results: glucose 164/PT 15.4  CT Head Code Stroke WO Contrast: Extensive hypodensity in the right MCA territory, likely cytotoxic edema from acute infarct, with 4 mm  right-to-left midline shift. Possible petechial hemorrhage. No evidence of right hemorrhagic transformation. ASPECTS is 0. Hyperdense right MCA, as well as a possible hyperdense right ICA, concerning for thrombus.  CTA Head/Neck W/WO CM:  Findings concerning for thrombus in the right cavernous and supraclinoid ICA, which may extend into the right M1, with minimal opacification of the right MCA and its branches. The right A1 is not opacified but more distal ACAs receive flow from the anterior communicating artery. No hemodynamically significant stenosis in the neck.  Repeat CT Head WO Contrast: Redemonstrated cytotoxic edema from acute infarct, with 7 mm of right-to-left midline shift at the level of the thalami, previously 4 mm. No evidence of hemorrhagic transformation. Possible petechial hemorrhage  Pertinent  Medical History  Left MCA Territory Cryptogenic Embolic Stroke (no deficits) Loop Recorder in Place  Chronic Bradycardia (typical heart rate in the 50's) Spinal Cord Stimulator for Chronic Pain  HTN HLD  Obesity s/p Gastrectomy  Edema   Significant Hospital Events: Including procedures, antibiotic start and stop dates in addition to other pertinent events   11/14: Pt admitted with worsening cytotoxic edema secondary to acute right MCA infarct with 7 mm right-to-left midline shift and thrombus in the right ICA with extension into M1 requiring hypertonic saline   Interim History / Subjective:  Pt protecting her airway noted to have left-sided hemiparesis, mild aphasia, left-sided facial droop, right deviation of left eye. Following commands on the right.  No signs of respiratory distress  Objective   Blood pressure 124/77, pulse (!) 59, resp. rate 17, SpO2 96%.       No intake or output data in the  24 hours ending 02/28/23 1815 There were no vitals filed for this visit.  Examination: General: Acutely-ill appearing female, NAD on RA  HENT: Normocephalic, difficult to assess for  JVD due to body habitus  Lungs: Diminished throughout, even, non labored  Cardiovascular: Sinus bradycardia, s1s2, no m/r/g, 2+ radial/2+ distal pulses, 2+ bilateral lower extemity edema  Abdomen: +BS x4, soft, obese, non tender, non distended  Extremities: Left-sided hemiparesis, chronic vascular skin color changes of bilateral lower extremities  Skin: Bilateral groin moisture associated injury present on admission  Neuro: Lethargic but easily arousable, oriented to self only, following commands on the right, left-sided hemiparesis, left eye right deviation, bilateral pupils round/reactive  GU: Purewick in place   Resolved Hospital Problem list     Assessment & Plan:   #Acute encephalopathy secondary to cytotoxic edema from acute right MCA infarct with 7 mm right-to-left midline shift and thrombus in the right ICA with extension into M1 Hx: Left MCA territory cryptogenic embolic stroke with no deficits - Neurology consulted appreciate input: per recommendations 250 ml hypertonic saline followed by continuous gtt @75  ml/hr due to worsening cytotoxic edema if cytotoxic edema worsens consult neurosurgery for possible craniotomy; pt outside of window for surgical intervention of thrombus in the right ICA - BMP and Osmolality q6hrs  - Goal sodium level 150-155 meq/L; if sodium >160 STOP 3% NaCl  - Neuro checks q1hr x12hrs, then q4hrs  - Avoid sedating medications  - Urine drug screen pending  - Stat CT Head for acute neuro changes  - Supplemental O2 for to maintain O2 sats >94% - HIGH RISK FOR MECHANICAL INTUBATION   #Chronic bradycardia - Continuous telemetry monitoring   Best Practice (right click and "Reselect all SmartList Selections" daily)   Diet/type: NPO DVT prophylaxis: SCD GI prophylaxis: N/A Lines: N/A Foley:  N/A Code Status:  full code Last date of multidisciplinary goals of care discussion [N/A]  11/14: ICU Intensivist discussed pts condition and goals of care with  pts family.  Pts daughter who is her POA stated if necessary she would be ok with short term intubation.  All questions were answered  Labs   CBC: Recent Labs  Lab 02/28/23 1518  WBC 10.1  NEUTROABS 8.8*  HGB 13.1  HCT 39.5  MCV 94.0  PLT 184    Basic Metabolic Panel: Recent Labs  Lab 02/28/23 1518 02/28/23 1536  NA 140  --   K 3.9  --   CL 108  --   CO2 23  --   GLUCOSE 164*  --   BUN 22  --   CREATININE 0.93 1.00  CALCIUM 9.4  --    GFR: CrCl cannot be calculated (Unknown ideal weight.). Recent Labs  Lab 02/28/23 1518  WBC 10.1    Liver Function Tests: Recent Labs  Lab 02/28/23 1518  AST 33  ALT 17  ALKPHOS 68  BILITOT 2.2*  PROT 7.7  ALBUMIN 4.3   No results for input(s): "LIPASE", "AMYLASE" in the last 168 hours. No results for input(s): "AMMONIA" in the last 168 hours.  ABG No results found for: "PHART", "PCO2ART", "PO2ART", "HCO3", "TCO2", "ACIDBASEDEF", "O2SAT"   Coagulation Profile: Recent Labs  Lab 02/28/23 1518  INR 1.2    Cardiac Enzymes: No results for input(s): "CKTOTAL", "CKMB", "CKMBINDEX", "TROPONINI" in the last 168 hours.  HbA1C: Hgb A1c MFr Bld  Date/Time Value Ref Range Status  08/21/2020 02:38 AM 4.9 4.8 - 5.6 % Final    Comment:    (NOTE)  Pre diabetes:          5.7%-6.4%  Diabetes:              >6.4%  Glycemic control for   <7.0% adults with diabetes     CBG: Recent Labs  Lab 02/28/23 1516  GLUCAP 148*    Review of Systems:   Unable to assess pt confused   Past Medical History:  She,  has a past medical history of Chronic pain and Edema extremities.   Surgical History:   Past Surgical History:  Procedure Laterality Date   ABDOMINAL HYSTERECTOMY  1989   BACK SURGERY     GASTRIC BYPASS     ROBOTIC ASSISTED LAPAROSCOPIC LYSIS OF ADHESION N/A 12/30/2021   Procedure: XI ROBOTIC ASSISTED LAPAROSCOPIC LYSIS OF ADHESION;  Surgeon: Campbell Lerner, MD;  Location: [facility] ORS;  Service: General;  Laterality:  N/A;   XI ROBOTIC ASSISTED VENTRAL HERNIA N/A 12/30/2021   Procedure: XI ROBOTIC ASSISTED VENTRAL HERNIA;  Surgeon: Campbell Lerner, MD;  Location: [facility] ORS;  Service: General;  Laterality: N/A;     Social History:   reports that she has never smoked. She has never used smokeless tobacco. She reports that she does not drink alcohol and does not use drugs.   Family History:  Her family history includes Breast cancer (age of onset: 26) in her maternal aunt; Diabetes in her father; Stroke in her father.   Allergies Allergies  Allergen Reactions   Bupropion Nausea And Vomiting   Diazepam Rash   Penicillins Itching   Rofecoxib Swelling   Latex Itching    When used gloves in the past   Metformin Other (See Comments)   Prozac [Fluoxetine Hcl] Itching and Rash   Zinc Oxide [Mexsana] Rash     Home Medications  Prior to Admission medications   Medication Sig Start Date End Date Taking? Authorizing Provider  acetaminophen (TYLENOL) 500 MG tablet Take 2 tablets (1,000 mg total) by mouth every 6 (six) hours as needed for moderate pain. 08/27/20   Calvert Cantor, MD  atorvastatin (LIPITOR) 40 MG tablet Take 1 tablet (40 mg total) by mouth daily. 09/05/20 11/15/20  Pokhrel, Rebekah Chesterfield, MD  buprenorphine (SUBUTEX) 8 MG SUBL SL tablet Place 8 mg under the tongue 2 (two) times daily.    [provider]  clopidogrel (PLAVIX) 75 MG tablet Take 75 mg by mouth daily.    [provider]  hydrochlorothiazide (HYDRODIURIL) 25 MG tablet Take 25 mg by mouth daily.    [provider]  losartan (COZAAR) 100 MG tablet Take 1 tablet (100 mg total) by mouth daily. 01/07/22   Arnetha Courser, MD  ondansetron (ZOFRAN-ODT) 4 MG disintegrating tablet Take 1 tablet (4 mg total) by mouth every 8 (eight) hours as needed. 10/02/22   Georga Hacking, MD  pregabalin (LYRICA) 100 MG capsule Take 200 mg by mouth daily. 08/12/20   [provider]  sertraline (ZOLOFT) 100 MG tablet Take 100 mg by  mouth at bedtime. 05/18/20   [provider]  tiZANidine (ZANAFLEX) 4 MG tablet Take 4 mg by mouth 2 (two) times daily.    [provider]     Critical care time: 65 minutes     Zada Girt, AGNP  Pulmonary/Critical Care Pager [phone] (please enter 7 digits) PCCM Consult Pager [phone] (please enter 7 digits)

## 2023-02-28 NOTE — Consult Note (Signed)
CODE STROKE- PHARMACY COMMUNICATION   Time CODE STROKE called/page received: 1514  Time response to CODE STROKE was made (in person or via phone): 1517, in person  Time Stroke Kit retrieved from Pyxis (only if needed): N/A No thrombolytic administered per Dr. Iver Nestle  Name of Provider/Nurse contacted: Dr. Iver Nestle  Past Medical History:  Diagnosis Date   Chronic pain    Edema extremities    Prior to Admission medications   Medication Sig Start Date End Date Taking? Authorizing Provider  acetaminophen (TYLENOL) 500 MG tablet Take 2 tablets (1,000 mg total) by mouth every 6 (six) hours as needed for moderate pain. 08/27/20   Calvert Cantor, MD  atorvastatin (LIPITOR) 40 MG tablet Take 1 tablet (40 mg total) by mouth daily. 09/05/20 11/15/20  Pokhrel, Rebekah Chesterfield, MD  buprenorphine (SUBUTEX) 8 MG SUBL SL tablet Place 8 mg under the tongue 2 (two) times daily.    [provider]  clopidogrel (PLAVIX) 75 MG tablet Take 75 mg by mouth daily.    [provider]  hydrochlorothiazide (HYDRODIURIL) 25 MG tablet Take 25 mg by mouth daily.    [provider]  losartan (COZAAR) 100 MG tablet Take 1 tablet (100 mg total) by mouth daily. 01/07/22   Arnetha Courser, MD  ondansetron (ZOFRAN-ODT) 4 MG disintegrating tablet Take 1 tablet (4 mg total) by mouth every 8 (eight) hours as needed. 10/02/22   Georga Hacking, MD  pregabalin (LYRICA) 100 MG capsule Take 200 mg by mouth daily. 08/12/20   [provider]  sertraline (ZOLOFT) 100 MG tablet Take 100 mg by mouth at bedtime. 05/18/20   [provider]  tiZANidine (ZANAFLEX) 4 MG tablet Take 4 mg by mouth 2 (two) times daily.    [provider]    Orson Aloe ,PharmD Clinical Pharmacist  02/28/2023  3:15 PM

## 2023-02-28 NOTE — ED Notes (Signed)
This RN and Misty Stanley, RN assisted pt onto bedpan. Pt had soiled brief and pants already, so pt changed into new brief and complete bed change done.

## 2023-02-28 NOTE — ED Notes (Signed)
Pt back from CT. More alert and talkative at this time.

## 2023-02-28 NOTE — ED Notes (Signed)
Dr. Iver Nestle at bedside to speak with pt's family

## 2023-02-28 NOTE — ED Triage Notes (Addendum)
Patient presents to ED with [facility] from [facility] assisted living. Originally called for fall at facility, upon EMS arrival, pt was altered with slurred speech and L side neglect with R side deviation.   LKW 1700 on 11/13  BG 194  Pmhx includes stroke in May with no deficits

## 2023-02-28 NOTE — ED Provider Notes (Signed)
[facility] Provider Note   Event Date/Time   First MD Initiated Contact with Patient 02/28/23 1511     (approximate) History  Altered Mental Status  HPI Morgan Dixon is a 72 y.o. female with a past medical history of CVA, hypertension, peripheral arterial disease, chronic lymphedema who presents via EMS after she was found down with last known normal at 1700 yesterday with signs of dense left hemiplegia, right eye gaze deviation, and left hemineglect.  Patient is able to verbalize her name but otherwise is inconsistent with history. ROS: Unable to assess   Physical Exam  Triage Vital Signs: ED Triage Vitals  Encounter Vitals Group     BP 02/28/23 1511 (!) 154/51     Systolic BP Percentile --      Diastolic BP Percentile --      Pulse Rate 02/28/23 1511 (!) 42     Resp --      Temp --      Temp Source 02/28/23 1511 Oral     SpO2 02/28/23 1511 99 %     Weight --      Height --      Head Circumference --      Peak Flow --      Pain Score 02/28/23 1512 0     Pain Loc --      Pain Education --      Exclude from Growth Chart --    Most recent vital signs: Vitals:   02/28/23 2148 02/28/23 2200  BP: 97/69 92/77  Pulse: (!) 200 (!) 52  Resp: 17 (!) 21  Temp:    SpO2: 99% 100%   General: Awake, cooperative CV:  Good peripheral perfusion.  Resp:  Normal effort.  Abd:  No distention.  Other:  Elderly obese Caucasian female resting comfortably in no acute distress.  Flaccid paralysis of left upper and lower extremity.  Dysarthria.  Right gaze deviation.  Left sided neglect ED Results / Procedures / Treatments  Labs (all labs ordered are listed, but only abnormal results are displayed) Labs Reviewed  PROTIME-INR - Abnormal; Notable for the following components:      Result Value   Prothrombin Time 15.4 (*)    All other components within normal limits  COMPREHENSIVE METABOLIC PANEL - Abnormal; Notable for the following components:   Glucose,  Bld 164 (*)    Total Bilirubin 2.2 (*)    All other components within normal limits  CBC WITH DIFFERENTIAL/PLATELET - Abnormal; Notable for the following components:   Neutro Abs 8.8 (*)    All other components within normal limits  GLUCOSE, CAPILLARY - Abnormal; Notable for the following components:   Glucose-Capillary 123 (*)    All other components within normal limits  BASIC METABOLIC PANEL - Abnormal; Notable for the following components:   Glucose, Bld 128 (*)    All other components within normal limits  OSMOLALITY - Abnormal; Notable for the following components:   Osmolality 314 (*)    All other components within normal limits  CBG MONITORING, ED - Abnormal; Notable for the following components:   Glucose-Capillary 148 (*)    All other components within normal limits  MRSA NEXT GEN BY PCR, NASAL  ETHANOL  APTT  URINE DRUG SCREEN, QUALITATIVE ([facility] ONLY)  URINALYSIS, ROUTINE W REFLEX MICROSCOPIC  SODIUM  SODIUM  RENAL FUNCTION PANEL  MAGNESIUM  CBC  OSMOLALITY  OSMOLALITY  LIPID PANEL  HEMOGLOBIN A1C  I-STAT CREATININE, ED  EKG ED ECG REPORT I, Merwyn Katos, the attending physician, personally viewed and interpreted this ECG. Date: 02/28/2023 EKG Time: 1557 Rate: 46 Rhythm: Bradycardic sinus rhythm QRS Axis: normal Intervals: normal ST/T Wave abnormalities: normal Narrative Interpretation: Bradycardic sinus rhythm.  No evidence of acute ischemia RADIOLOGY ED MD interpretation: CT of the head without contrast shows redemonstration of cytotoxic edema from acute infarct with 7 mm of right-to-left midline shift at the level of the thalami  CTA of the head and neck independently interpreted and shows thrombus in the right cavernous and supraclinoid ICA -Agree with radiology assessment Official radiology report(s): CT HEAD WO CONTRAST ( )  Result Date: 02/28/2023 CLINICAL DATA:  Previous stroke code, worsening mental status EXAM: CT HEAD WITHOUT CONTRAST  TECHNIQUE: Contiguous axial images were obtained from the base of the skull through the vertex without intravenous contrast. RADIATION DOSE REDUCTION: This exam was performed according to the departmental dose-optimization program which includes automated exposure control, adjustment of the mA and/or kV according to patient size and/or use of iterative reconstruction technique. COMPARISON:  02/28/2023 CT head 3:30 p.m. FINDINGS: Brain: Redemonstrated extensive hypodensity in the right MCA territory, most likely cytotoxic edema, which may have increased compared to the prior exam. Although there is not significantly more effacement of the right lateral ventricle, there appears to be 7 mm of right-to-left midline shift at the level of the thalami, previously 4 mm. No evidence of frank hemorrhagic transformation, although some hyperdensity within the area of infarct may represent petechial hemorrhage. The basilar cisterns remain patent. No evidence of additional acute infarct, mass, hydrocephalus, or extra-axial collection. Vascular: Redemonstrated hyperdense right ICA and MCA. Skull: Negative for fracture or focal lesion. Sinuses/Orbits: No acute finding. Other: The mastoid air cells are well aerated. IMPRESSION: Redemonstrated cytotoxic edema from acute infarct, with 7 mm of right-to-left midline shift at the level of the thalami, previously 4 mm. No evidence of hemorrhagic transformation. Possible petechial hemorrhage. Code stroke imaging results were communicated on 02/28/2023 at 5:09 pm to provider BHAGAT via secure text paging. Electronically Signed   By: Wiliam Ke M.D.   On: 02/28/2023 17:10   CT ANGIO HEAD NECK W WO CM  Result Date: 02/28/2023 CLINICAL DATA:  Stroke code EXAM: CT ANGIOGRAPHY HEAD AND NECK WITH AND WITHOUT CONTRAST TECHNIQUE: Multidetector CT imaging of the head and neck was performed using the standard protocol during bolus administration of intravenous contrast. Multiplanar CT image  reconstructions and MIPs were obtained to evaluate the vascular anatomy. Carotid stenosis measurements (when applicable) are obtained utilizing NASCET criteria, using the distal internal carotid diameter as the denominator. RADIATION DOSE REDUCTION: This exam was performed according to the departmental dose-optimization program which includes automated exposure control, adjustment of the mA and/or kV according to patient size and/or use of iterative reconstruction technique. CONTRAST:  75mL OMNIPAQUE IOHEXOL 350 MG/ML SOLN COMPARISON:  CTA 08/20/2020, correlation is also made with CT head 02/28/2023 FINDINGS: CT HEAD FINDINGS For noncontrast findings, please see same day CT head. CTA NECK FINDINGS Aortic arch: Two-vessel arch with a common origin of the brachiocephalic and left common carotid arteries. Imaged portion shows no evidence of aneurysm or dissection. No significant stenosis of the major arch vessel origins. Right carotid system: The right common and external carotid arteries are patent, without evidence of stenosis or dissection. Diminishing opacification of the right extracranial ICA as it approaches the skull base (series 7, image 132), without focal stenosis. Left carotid system: No evidence of dissection, occlusion, or hemodynamically significant  stenosis (greater than 50%). Vertebral arteries: No evidence of dissection, occlusion, or hemodynamically significant stenosis (greater than 50%). Skeleton: No acute osseous abnormality. Degenerative changes in the cervical spine. Other neck: No acute finding. Upper chest: No focal pulmonary opacity or pleural effusion. Review of the MIP images confirms the above findings CTA HEAD FINDINGS Anterior circulation: Diminishing opacification of the right intracranial ICA, with contrast outlining what is likely thrombus in the right cavernous and supraclinoid segment (series 6, image 127 series 6, image 120). The left ICA is patent to the terminus without  significant stenosis. Nonopacification of the right A1. The left A1 is patent. Normal anterior communicating artery. Anterior cerebral arteries are patent to their distal aspects without significant stenosis. Thrombus may extend into the right M1, with minimal opacification of right MCA branches. The left M1 and its branches are patent without significant stenosis. Posterior circulation: Vertebral arteries patent to the vertebrobasilar junction without significant stenosis. Posterior inferior cerebellar arteries patent proximally. Basilar patent to its distal aspect without significant stenosis. Superior cerebellar arteries patent proximally. Patent P1 segments. PCAs perfused to their distal aspects without significant stenosis. The bilateral posterior communicating arteries are not visualized. Venous sinuses: As permitted by contrast timing, patent. Anatomic variants: None significant. No evidence of aneurysm or vascular malformation. Review of the MIP images confirms the above findings IMPRESSION: 1. Findings concerning for thrombus in the right cavernous and supraclinoid ICA, which may extend into the right M1, with minimal opacification of the right MCA and its branches. 2. The right A1 is not opacified but more distal ACAs receive flow from the anterior communicating artery. 3. No hemodynamically significant stenosis in the neck. Code stroke imaging results were communicated on 02/28/2023 at 3:59 pm to provider BHAGAT via secure text paging. Electronically Signed   By: Wiliam Ke M.D.   On: 02/28/2023 16:01   CT HEAD CODE STROKE WO CONTRAST  Result Date: 02/28/2023 CLINICAL DATA:  Code stroke. EXAM: CT HEAD WITHOUT CONTRAST TECHNIQUE: Contiguous axial images were obtained from the base of the skull through the vertex without intravenous contrast. RADIATION DOSE REDUCTION: This exam was performed according to the departmental dose-optimization program which includes automated exposure control, adjustment  of the mA and/or kV according to patient size and/or use of iterative reconstruction technique. COMPARISON:  05/11/2021 FINDINGS: Brain: Extensive hypodensity in the right MCA territory, involving the right frontal, temporal, and parietal lobes, as well as the right basal ganglia, likely cytotoxic edema, with narrowing of the right lateral ventricle and third ventricle. 4 mm right-to-left midline shift some curvilinear hyperdensity within the area of hypodensity may reflect petechial hemorrhage. No evidence of hemorrhagic transformation. No mass. No extra-axial collection or hydrocephalus. Remote left frontoparietal infarct. Vascular: Hyperdense right MCA, as well as a possible hyperdense right ICA. Skull: Negative for fracture or focal lesion. Sinuses/Orbits: Minimal mucosal thickening in the ethmoid air cells. No acute finding in the orbits. Other: The mastoid air cells are well aerated. ASPECTS [facility] Stroke Program Early CT Score) - Ganglionic level infarction (caudate, lentiform nuclei, internal capsule, insula, M1-M3 cortex): 0 - Supraganglionic infarction (M4-M6 cortex): 0 Total score (0-10 with 10 being normal): 0 IMPRESSION: 1. Extensive hypodensity in the right MCA territory, likely cytotoxic edema from acute infarct, with 4 mm right-to-left midline shift. Possible petechial hemorrhage. No evidence of right hemorrhagic transformation. ASPECTS is 0. 2. Hyperdense right MCA, as well as a possible hyperdense right ICA, concerning for thrombus. Code stroke imaging results were communicated on 02/28/2023 at 3:37 pm  to provider BHAGAT via secure text paging. Electronically Signed   By: Wiliam Ke M.D.   On: 02/28/2023 15:40   PROCEDURES: Critical Care performed: Yes, see critical care procedure note(s) .1-3 Lead EKG Interpretation  Performed by: Merwyn Katos, MD Authorized by: Merwyn Katos, MD     Interpretation: abnormal     ECG rate:  49   ECG rate assessment: bradycardic     Rhythm: sinus  bradycardia     Ectopy: none     Conduction: normal   CRITICAL CARE Performed by: Merwyn Katos  Total critical care time: 43 minutes  Critical care time was exclusive of separately billable procedures and treating other patients.  Critical care was necessary to treat or prevent imminent or life-threatening deterioration.  Critical care was time spent personally by me on the following activities: development of treatment plan with patient and/or surrogate as well as nursing, discussions with consultants, evaluation of patient's response to treatment, examination of patient, obtaining history from patient or surrogate, ordering and performing treatments and interventions, ordering and review of laboratory studies, ordering and review of radiographic studies, pulse oximetry and re-evaluation of patient's condition.  MEDICATIONS ORDERED IN ED: Medications  sodium chloride (hypertonic) 3 % solution ( Intravenous Infusion Verify 02/28/23 2033)   stroke: early stages of recovery book (has no administration in time range)  iohexol (OMNIPAQUE) 350 MG/ML injection 75 mL (75 mLs Intravenous Contrast Given 02/28/23 1540)  sodium chloride 3% (hypertonic) IV bolus 250 mL ( Intravenous Stopped 02/28/23 2031)   IMPRESSION / MDM / ASSESSMENT AND PLAN / ED COURSE  I reviewed the triage vital signs and the nursing notes.                             The patient is on the cardiac monitor to evaluate for evidence of arrhythmia and/or significant heart rate changes. Patient's presentation is most consistent with acute presentation with potential threat to life or bodily function. Stroke alert PMH risk factors: Previous CVA, hypertension, hyperlipidemia Neurologic Deficits: Dense left hemiplegia, left neglect, and right gaze deviation Last known Well Time: 1700 02/27/23 NIH Stroke Score: 22 Given History and Exam I have lower suspicion for infectious etiology, neurologic changes secondary to toxicologic  ingestion, seizure, complex migraine. Presentation concerning for possible stroke requiring workup.  Workup: Labs: POC glucose, CBC, BMP, LFTs, Troponin, PT/INR, PTT, Type and Screen Other Diagnostics: ECG, CXR, non-contrast head CT followed by CTA brain and neck (to r/o large vessel occlusion amenable to thrombectomy) Interventions: Patient low on NIH scale and out of the window for tpa.  Consult: Neurology. Discussed with Dr. Iver Nestle regarding patient's neurological symptoms and last well-known time and eligibility for TPA criteria. Disposition: Admission to ICU.   FINAL CLINICAL IMPRESSION(S) / ED DIAGNOSES   Final diagnoses:  Cerebrovascular accident (CVA), unspecified mechanism (HCC)  Altered mental status, unspecified altered mental status type   Rx / DC Orders   ED Discharge Orders     None      Note:  This document was prepared using Dragon voice recognition software and may include unintentional dictation errors.   Merwyn Katos, MD 02/28/23 [phone]

## 2023-02-28 NOTE — Progress Notes (Signed)
Code stroke cart activated at 1511-Dr. Bradler at bedside.  Teleneurologist paged at 1513.  Pt to CT at 1517. Pt arrived to CT at 1518-Dr. Bhagat at bedside.   Morgan Dixon, Multimedia programmer

## 2023-02-28 NOTE — Consult Note (Addendum)
PHARMACY CONSULT NOTE - Hypertonic Saline  Pharmacy Consult for Hypertonic Saline Monitoring and Management  Recent Labs: Potassium (mmol/L)  Date Value  02/28/2023 3.9  12/11/2012 4.0   Magnesium (mg/dL)  Date Value  40/98/1191 2.1   Calcium (mg/dL)  Date Value  47/82/9562 9.4   Calcium, Total (mg/dL)  Date Value  13/11/6576 9.3   Albumin (g/dL)  Date Value  46/96/2952 4.3   Phosphorus (mg/dL)  Date Value  84/13/2440 3.6   Sodium (mmol/L)  Date Value  02/28/2023 140  12/11/2012 136    Assessment  Morgan Dixon is a 72 y.o. female presenting with stroke and cerebral edema. PMH significant for chronic pain s/p spinal cord stimulator and cryptogenic embolic stroke in 2022. She presented today with concerns for stroke and LKW was 1700 on 11/13. No thrombolytic was given. Pharmacy now been consulted to monitor hypertonic saline (3%) infusion in the setting of cerebral edema and concerns for impending herniation.  Pertinent medications: N/A  Baseline sodium: 140  Goal of Therapy:  Goal sodium level 150-155 Not to exceed Na <160  Monitoring:  Date Time Na Rate/Comment    Plan:  Administer 3% saline 250 mL bolus followed by 3% saline continuous infusion at 75 mL/hr Check sodium every 6 hours as ordered by CCM Will also obtain a sodium level 3 hours after drip starts Notify attending and neurologist if Na >160 Na increases > 6 mEq/L in first 4 hours Na increases > 6 mEq/L in 6 hours   Will M. Dareen Piano, PharmD Clinical Pharmacist 02/28/2023 6:11 PM

## 2023-02-28 NOTE — Consult Note (Signed)
NEUROLOGY CONSULT NOTE   Date of service: February 28, 2023 Patient Name: Morgan Dixon MRN:  [id number] DOB:  1950-10-21 Chief Complaint: "left sided weakess" Requesting Provider: Merwyn Katos, MD  History of Present Illness  Morgan Dixon is a 72 y.o. female with a past medical history significant for prior left MCA territory cryptogenic embolic stroke, loop recorder in place, baseline bradycardia (typically in the 93s), hypertension, hyperlipidemia, peripheral artery disease, obesity s/p gastrectomy, spinal cord stimulator in place for chronic pain on chronic opiates who presents with left sided weakness and neglect   She lives in an independent living facility and was last seen on 11/13 at around 5 PM. Brought to the ED by EMS when found by staff today.   LKW: 11/13 5 PM Modified rankin score: 1 (mild aphasia and cognitive impairment only) IV Thrombolysis: No, completed infarct EVT: No, completed infarct with > 200 cc core ICH Score: N/a petechial hemorrhage only   ROS  Limited by somnolence/stroke; denied any recent complaints  Past History   Past Medical History:  Diagnosis Date   Chronic pain    Edema extremities     Past Surgical History:  Procedure Laterality Date   ABDOMINAL HYSTERECTOMY  1989   BACK SURGERY     GASTRIC BYPASS     ROBOTIC ASSISTED LAPAROSCOPIC LYSIS OF ADHESION N/A 12/30/2021   Procedure: XI ROBOTIC ASSISTED LAPAROSCOPIC LYSIS OF ADHESION;  Surgeon: Campbell Lerner, MD;  Location: [facility] ORS;  Service: General;  Laterality: N/A;   XI ROBOTIC ASSISTED VENTRAL HERNIA N/A 12/30/2021   Procedure: XI ROBOTIC ASSISTED VENTRAL HERNIA;  Surgeon: Campbell Lerner, MD;  Location: [facility] ORS;  Service: General;  Laterality: N/A;    Family History: Family History  Problem Relation Age of Onset   Breast cancer Maternal Aunt 74   Diabetes Father    Stroke Father     Social History  reports that she has never smoked. She has never used smokeless  tobacco. She reports that she does not drink alcohol and does not use drugs.  Allergies  Allergen Reactions   Bupropion Nausea And Vomiting   Diazepam Rash   Penicillins Itching   Rofecoxib Swelling   Latex Itching    When used gloves in the past   Metformin Other (See Comments)   Prozac [Fluoxetine Hcl] Itching and Rash   Zinc Oxide [Mexsana] Rash    Medications  No current facility-administered medications for this encounter.  Current Outpatient Medications:    acetaminophen (TYLENOL) 500 MG tablet, Take 2 tablets (1,000 mg total) by mouth every 6 (six) hours as needed for moderate pain., Disp: 30 tablet, Rfl: 0   atorvastatin (LIPITOR) 40 MG tablet, Take 1 tablet (40 mg total) by mouth daily., Disp: 30 tablet, Rfl: 2   buprenorphine (SUBUTEX) 8 MG SUBL SL tablet, Place 8 mg under the tongue 2 (two) times daily., Disp: , Rfl:    clopidogrel (PLAVIX) 75 MG tablet, Take 75 mg by mouth daily., Disp: , Rfl:    hydrochlorothiazide (HYDRODIURIL) 25 MG tablet, Take 25 mg by mouth daily., Disp: , Rfl:    losartan (COZAAR) 100 MG tablet, Take 1 tablet (100 mg total) by mouth daily., Disp: 30 tablet, Rfl: 1   ondansetron (ZOFRAN-ODT) 4 MG disintegrating tablet, Take 1 tablet (4 mg total) by mouth every 8 (eight) hours as needed., Disp: 20 tablet, Rfl: 0   pregabalin (LYRICA) 100 MG capsule, Take 200 mg by mouth daily., Disp: , Rfl:  sertraline (ZOLOFT) 100 MG tablet, Take 100 mg by mouth at bedtime., Disp: , Rfl:    tiZANidine (ZANAFLEX) 4 MG tablet, Take 4 mg by mouth 2 (two) times daily., Disp: , Rfl:   Vitals   Vitals:   2023-03-09 1511  BP: (!) 154/51  Pulse: (!) 42  TempSrc: Oral  SpO2: 99%    There is no height or weight on file to calculate BMI.  Physical Exam   Constitutional: Appears well-developed and well-nourished. Psych: Affect calm, cooperative Eyes: No scleral injection.  HENT: No OP obstruction.  Head: Normocephalic.  Cardiovascular: Normal rate and regular  rhythm.  Respiratory: Effort normal, non-labored breathing.  GI: Soft.  No distension. There is no tenderness.   Neurologic Examination   Neuro: Mental Status: At time of CT scan patient was very awake, alert, conversational, following simple commands, able to name and repeat with only mild speech hesitancy at times, with profound left-sided neglect (inability to recognize her own arm as hers).  Short-term later she was noted to be much harder to wake up, only inconsistently following some commands, with a generally depressed level of consciousness requiring repeated stimulation to maintain awareness Cranial Nerves: II: Visual Fields are notable for a left hemianopia. Pupils are equal, round, and reactive to light.   III,IV, VI: Eyes track to midline at best, but otherwise right gaze deviation V: Facial sensation is symmetric to eyelash brush VII: Facial movement is notable for a marked left facial droop.  VIII: hearing is intact to voice Motor: No movement in the left upper or lower extremity to noxious stimulation.  Slight drift of the right upper extremity.  Right lower extremity drifts to bed (could maintain antigravity per nursing)  Sensory: Unable to feel stimuli on the left side  Cerebellar: Finger-nose intact on the right  NIHSS total 22 Score breakdown: 0 points for level of consciousness initially (later 2 points), 1 point for LoC questions, 1 point for right gaze preference, 2 points for left hemianopia, 2 points for left facial droop, 4 points for left arm weakness, 1 point for right arm weakness, 4 points for left leg weakness, 2 points for right leg weakness, 2 points for sensory loss on the left side, 1 point for aphasia, 1 point for dysarthria, 2 points for neglect of the left side   Labs/Imaging/Neurodiagnostic studies   CBC:  Recent Labs  Lab Mar 09, 2023 1518  WBC 10.1  NEUTROABS 8.8*  HGB 13.1  HCT 39.5  MCV 94.0  PLT 184    Basic Metabolic Panel:  Lab Results   Component Value Date   NA 140 03-09-23   K 3.9 03/09/23   CO2 23 03/09/2023   GLUCOSE 164 (H) 09-Mar-2023   BUN 22 March 09, 2023   CREATININE 1.00 09-Mar-2023   CALCIUM 9.4 03/09/23   GFRNONAA >60 03-09-2023   GFRAA >60 12/11/2012    Lipid Panel:  Lab Results  Component Value Date   LDLCALC 88 08/21/2020    HgbA1c:  Lab Results  Component Value Date   HGBA1C 4.9 08/21/2020    Urine Drug Screen:     Component Value Date/Time   LABOPIA NONE DETECTED 08/17/2020 1615   COCAINSCRNUR NONE DETECTED 08/17/2020 1615   COCAINSCRNUR NONE DETECTED 05/24/2020 1959   LABBENZ NONE DETECTED 08/17/2020 1615   AMPHETMU NONE DETECTED 08/17/2020 1615   THCU NONE DETECTED 08/17/2020 1615   LABBARB NONE DETECTED 08/17/2020 1615     Alcohol Level     Component Value Date/Time  ETH <10 05/24/2020 1717    INR  Lab Results  Component Value Date   INR 1.1 08/18/2020    APTT  Lab Results  Component Value Date   APTT 29 08/18/2020    AED levels: No results found for: "PHENYTOIN", "ZONISAMIDE", "LAMOTRIGINE", "LEVETIRACETA"    CT Head without contrast(Personally reviewed): Completed Right MCA infarct with possible associated petechial hemorrhage  CT angio Head and Neck with contrast(Personally reviewed): 1. Findings concerning for thrombus in the right cavernous and supraclinoid ICA, which may extend into the right M1, with minimal opacification of the right MCA and its branches. 2. The right A1 is not opacified but more distal ACAs receive flow from the anterior communicating artery. 3. No hemodynamically significant stenosis in the neck.   ASSESSMENT   Large right MCA stroke, embolic in appearance. Due to increasing somnolence after initial head CT, repeat head CT obtained. Cerebral edema, midline shift present on admission  RECOMMENDATIONS   # Right MCA embolic stroke # Cerebral edema with midline shift - Stroke labs HgbA1c, fasting lipid panel - MRI brain  -  Frequent neuro checks - Echocardiogram - ASA 325 mg daily  - Loop recorder interrogation - Risk factor modification - Telemetry monitoring - Blood pressure goal   - Permissive hypertension to 220/120 due to acute stroke for ~24 hours - PT consult, OT consult, Speech consult - Due to worsening midline shift in just 2 hours, will initiate hypertonic saline, 250 cc bolus with 50 cc/hr, goal 145-150  - Appreciate CCM following given fluctuating mental status and high risk for worsening - Neurology will follow along closely   # goals of care - Long discussion with daughter at bedside, including review of images. High risk of further edema; possible risk of death due to stroke size. Discussed at best would be total care due to left sided weakness, but still able to have meaningful conversations, with some difficulty with prosody. Discussed likely to need feeding tube for dysphagia given size of stroke. Family still processing but for now, full scope of care; they know she would not want long term ventilation  - Palliative care consultation   ______________________________________________________________________    Nunzio Cory MD-PhD [facility] [phone]  CRITICAL CARE Performed by: Gordy Councilman   Total critical care time: 90 minutes  Critical care time was exclusive of separately billable procedures and treating other patients.  Critical care was necessary to treat or prevent imminent or life-threatening deterioration.  Critical care was time spent personally by me on the following activities: development of treatment plan with patient and/or surrogate as well as nursing, discussions with consultants, evaluation of patient's response to treatment, examination of patient, obtaining history from patient or surrogate, ordering and performing treatments and interventions, ordering and review of laboratory studies, ordering and review of radiographic studies,  pulse oximetry and re-evaluation of patient's condition.

## 2023-02-28 NOTE — ED Notes (Signed)
CODE  STROKE  CALLED  TO  CARELINK AT  3:10PM

## 2023-02-28 NOTE — Progress Notes (Signed)
   02/28/23 1500  Spiritual Encounters  Type of Visit Initial;Attempt (pt unavailable)  Spiritual Care Plan  Spiritual Care Issues Still Outstanding Chaplain will continue to follow   Patient is in CT there is no family will check by to see if there is any family.

## 2023-03-01 ENCOUNTER — Inpatient Hospital Stay: Payer: Medicare HMO

## 2023-03-01 ENCOUNTER — Other Ambulatory Visit: Payer: Self-pay

## 2023-03-01 ENCOUNTER — Inpatient Hospital Stay
Admit: 2023-03-01 | Discharge: 2023-03-01 | Disposition: A | Discharge status: Home / Self Care | Payer: Medicare HMO | Attending: Neurology | Admitting: Neurology

## 2023-03-01 DIAGNOSIS — Z515 Encounter for palliative care: Secondary | ICD-10-CM | POA: Diagnosis not present

## 2023-03-01 DIAGNOSIS — Z79899 Other long term (current) drug therapy: Secondary | ICD-10-CM | POA: Diagnosis not present

## 2023-03-01 DIAGNOSIS — I63231 Cerebral infarction due to unspecified occlusion or stenosis of right carotid arteries: Secondary | ICD-10-CM

## 2023-03-01 DIAGNOSIS — R4182 Altered mental status, unspecified: Secondary | ICD-10-CM

## 2023-03-01 LAB — RENAL FUNCTION PANEL
Albumin: 3.7 g/dL (ref 3.5–5.0)
Anion gap: 11 (ref 5–15)
BUN: 23 mg/dL (ref 8–23)
CO2: 19 mmol/L — ABNORMAL LOW (ref 22–32)
Calcium: 9.1 mg/dL (ref 8.9–10.3)
Chloride: 121 mmol/L — ABNORMAL HIGH (ref 98–111)
Creatinine, Ser: 0.71 mg/dL (ref 0.44–1.00)
GFR, Estimated: >60 mL/min (ref >60)
Glucose, Bld: 178 mg/dL — ABNORMAL HIGH (ref 70–99)
Phosphorus: 2.8 mg/dL (ref 2.5–4.6)
Potassium: 3.5 mmol/L (ref 3.5–5.1)
Sodium: 150 mmol/L — ABNORMAL HIGH (ref 135–145)

## 2023-03-01 LAB — LIPID PANEL
Cholesterol: 97 mg/dL (ref 0–200)
HDL: 53 mg/dL (ref >40)
LDL Cholesterol: 32 mg/dL (ref 0–99)
Total CHOL/HDL Ratio: 1.8 {ratio}
Triglycerides: 60 mg/dL (ref <150)
VLDL: 12 mg/dL (ref 0–40)

## 2023-03-01 LAB — GLUCOSE, CAPILLARY
Glucose-Capillary: 158 mg/dL — ABNORMAL HIGH (ref 70–99)
Glucose-Capillary: 180 mg/dL — ABNORMAL HIGH (ref 70–99)
Glucose-Capillary: 146 mg/dL — ABNORMAL HIGH (ref 70–99)
Glucose-Capillary: 146 mg/dL — ABNORMAL HIGH (ref 70–99)
Glucose-Capillary: 138 mg/dL — ABNORMAL HIGH (ref 70–99)
Glucose-Capillary: 135 mg/dL — ABNORMAL HIGH (ref 70–99)

## 2023-03-01 LAB — HEMOGLOBIN A1C
Hgb A1c MFr Bld: 5.3 % (ref 4.8–5.6)
Mean Plasma Glucose: 105.41 mg/dL

## 2023-03-01 LAB — ECHOCARDIOGRAM COMPLETE
AR max vel: 2.21 cm2
AV Area VTI: 2.38 cm2
AV Area mean vel: 2.04 cm2
AV Mean grad: 5 mm[Hg]
AV Peak grad: 11 mm[Hg]
Ao pk vel: 1.66 m/s
Area-P 1/2: 3.99 cm2
Height: 66 in
S' Lateral: 2.95 cm
Weight: 3844.82 [oz_av]

## 2023-03-01 LAB — CBC
HCT: 33.9 % — ABNORMAL LOW (ref 36.0–46.0)
Hemoglobin: 11.5 g/dL — ABNORMAL LOW (ref 12.0–15.0)
MCH: 30.8 pg (ref 26.0–34.0)
MCHC: 33.9 g/dL (ref 30.0–36.0)
MCV: 90.9 fL (ref 80.0–100.0)
Platelets: 150 10*3/uL (ref 150–400)
RBC: 3.73 MIL/uL — ABNORMAL LOW (ref 3.87–5.11)
RDW: 13.2 % (ref 11.5–15.5)
WBC: 4.9 10*3/uL (ref 4.0–10.5)
nRBC: 0 % (ref 0.0–0.2)

## 2023-03-01 LAB — SODIUM
Sodium: 147 mmol/L — ABNORMAL HIGH (ref 135–145)
Sodium: 153 mmol/L — ABNORMAL HIGH (ref 135–145)
Sodium: 152 mmol/L — ABNORMAL HIGH (ref 135–145)
Sodium: 153 mmol/L — ABNORMAL HIGH (ref 135–145)

## 2023-03-01 LAB — MAGNESIUM: Magnesium: 2.3 mg/dL (ref 1.7–2.4)

## 2023-03-01 LAB — OSMOLALITY
Osmolality: 317 mosm/kg — ABNORMAL HIGH (ref 275–295)
Osmolality: 324 mosm/kg (ref 275–295)
Osmolality: 331 mosm/kg (ref 275–295)
Osmolality: 333 mosm/kg (ref 275–295)

## 2023-03-01 MED ORDER — ACETAMINOPHEN 10 MG/ML IV SOLN
1000.0000 mg | Freq: Once | INTRAVENOUS | Status: AC
  Administered 2023-03-01: 1000 mg via INTRAVENOUS
  Filled 2023-03-01: qty 100

## 2023-03-01 MED ORDER — CHLORHEXIDINE GLUCONATE CLOTH 2 % EX PADS
6.0000 | MEDICATED_PAD | Freq: Every day | CUTANEOUS | Status: DC
  Administered 2023-03-01 – 2023-03-03 (×3): 6 via TOPICAL

## 2023-03-01 MED ORDER — ASPIRIN 300 MG RE SUPP
300.0000 mg | Freq: Every day | RECTAL | Status: DC
  Administered 2023-03-01 – 2023-03-04 (×4): 300 mg via RECTAL
  Filled 2023-03-01 (×4): qty 1

## 2023-03-01 MED ORDER — ORAL CARE MOUTH RINSE: 15.0000 mL | OROMUCOSAL | Status: DC | PRN

## 2023-03-01 NOTE — Progress Notes (Signed)
*  PRELIMINARY RESULTS* Echocardiogram 2D Echocardiogram has been performed.  Cristela Blue 03/01/2023, 2:08 PM

## 2023-03-01 NOTE — Progress Notes (Signed)
Attempted to call daughter at number listed in the computer without success for PICC consent.  Per Florentina Addison, RN this is the only number that they have to reach her and she is not at bedside.  Katie, RN aware that PICC cannot be placed until consent is obtained.  Will continue to try to reach daughter.

## 2023-03-01 NOTE — Progress Notes (Addendum)
SLP Cancellation Note  Patient Details Name: Morgan Dixon MRN: [id number] DOB: 19-Nov-1950   Cancelled treatment:       Reason Eval/Treat Not Completed:  (chart reviewd; consulted NSG re: pt's status today.)  Per chart history, pt was seen in Outpatient services at [facility] 8-12/2022 and stopped services 12/2022. Per the chart notes then: "She was admitted for work-up of acute metabolic encephalopathy 08/18/2022 with CT head showing left parietal temporal lobe hypodensity initially concerning for mass but repeat CT head with contrast more likely subacute stroke in left parietal area rather than mass. Unable to complete MRI due to spinal stimulator. Infarct embolic secondary to unknown source and recommended 30-day cardiac event monitor vs ILR to evaluate for A. fib. Stroke team signed off on 5/9 but reevaluated on 5/19 with patient complaining of headache and repeat CT head showed small focus of hemorrhagic conversion with repeat imaging the following day showing stable appearance. Therapies initially recommended SNF but due to insurance denial, she was discharged home with her daughter with home health therapies. Discharged home on 09/05/2020. Pt stated she received [facility] SLP services".  [name] Outpatient services were for Aphasia, expressive language deficits, cognitive-linguistic deficits.  At this admit to [facility] on 02/28/2023, a code stroke was initiated upon presentation to ED from her Nursing Facility. Head CTs: "Extensive hypodensity in the right MCA territory, involving the right frontal, temporal, and parietal lobes, as well as the right basal ganglia, likely cytotoxic edema, with narrowing of the right lateral ventricle and third ventricle.  Redemonstrated cytotoxic edema from acute infarct, with 7 mm of right-to-left midline shift at the level of the thalami, previously 4 mm. No evidence of hemorrhagic transformation. Possible petechial hemorrhage."(2nd Head CT in the afternoon).  Noted another  active CT exam pending this morning.   In setting of pt's ongoing Neurological changes, ST services will hold on pt's Cognitive-linguistic evaluation today. Will f/u as pt's Neurological status stabilizes. NSG updated, agreed.     Jerilynn Som, MS, CCC-SLP Speech Language Pathologist Rehab Services; [facility] [phone] (ascom) Hiran Leard 03/01/2023, 8:45 AM

## 2023-03-01 NOTE — IPAL (Signed)
  Interdisciplinary Goals of Care Family Meeting   Date carried out: 03/01/2023  Location of the meeting: Bedside  Member's involved: Physician, Nurse Practitioner, Family Member or next of kin, and Palliative care team member  Durable Power of Attorney or acting medical decision maker: Daughter [place]. One son was present in person and another son was over the phone.   Discussion: We discussed goals of care for Morgan Dixon. Discussed her overall care including the large right MCA stroke. Explained that this will result in a significant deficit and that she will not regain mobility nor ability to feed her self. She would require 24/7 care at a skilled nursing facility. Explained that she will be at high risk for nosocomial infections. Family understand that this could result in a great deal of suffering, and after discussing options moving forward, they opted for DNR/DNI status while awaiting clinical progression. We decided to re-visit further goals of care as her clinical status develops, especially the potential swelling over the next 24 to 48 hours.  Code status:   Code Status: Limited: Do not attempt resuscitation (DNR) -DNR-LIMITED -Do Not Intubate/DNI    Disposition: Continue current acute care  Time spent for the meeting: 15 minutes    Raechel Chute, MD  03/01/2023, 4:25 PM

## 2023-03-01 NOTE — Evaluation (Signed)
Occupational Therapy Evaluation Patient Details Name: Morgan Dixon MRN: [id number] DOB: 10-19-1950 Today's Date: 03/01/2023   History of Present Illness Pt is a 72 y.o. 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   Clinical Impression   Chart reviewed to date, nurse cleared pt for participation in OT evaluation. Pt is oriented to self, inconsistently to place/situation; Inconsistent one step direction following. Pt is a poor historian, however reports she amb with RW at ALF, performs ADL with MOD I. Will need to confirm. Please refer to OT evaluation below, pt presents with deficits LUE function, strength, sensation, vision, cognition affecting safe and optimal ADL completion. MAX A +2 required for rolling, MAX A +2 for supine<>long sit with MOD A +2 for static sitting in long sit. Right sided gaze preference with head turn, L sided neglect noted throughout. Vital signs monitored, stalbe throughout. Improved cognition noted with mobility. Pt will benefit from post acute OT to address deficits and to facilitate optimal ADL performance. OT will follow acutely.       If plan is discharge home, recommend the following: Two people to help with walking and/or transfers;Two people to help with bathing/dressing/bathroom;Assistance with cooking/housework;Direct supervision/assist for financial management;Supervision due to cognitive status;Assistance with feeding;Assist for transportation;Help with stairs or ramp for entrance    Functional Status Assessment  Patient has had a recent decline in their functional status and demonstrates the ability to make significant improvements in function in a  reasonable and predictable amount of time.  Equipment Recommendations  Other (comment) (defer)    Recommendations for Other Services       Precautions / Restrictions Precautions Precautions: Fall Restrictions Weight Bearing Restrictions: No      Mobility Bed Mobility Overal bed mobility: Needs Assistance Bed Mobility: Rolling Rolling: Max assist, +2 for physical assistance, +2 for safety/equipment         General bed mobility comments: supine<>long sit MAX A+2    Transfers                          Balance Overall balance assessment: Needs assistance Sitting-balance support: No upper extremity supported (supine>long sit) Sitting balance-Leahy Scale: Poor Sitting balance - Comments: MOD A +2 for static sitting long sit in bed; pt with noted improved alertness with mobility Postural control: Posterior lean                                 ADL either performed or assessed with clinical judgement   ADL Overall ADL's : Needs assistance/impaired Eating/Feeding: NPO   Grooming: Wash/dry face;Maximal assistance Grooming Details (indicate cue type and reason): with HOH A; praxis deficitis noted         Upper Body Dressing : Maximal assistance   Lower Body Dressing: Total assistance       Toileting- Clothing Manipulation and Hygiene: Total assistance Toileting - Clothing Manipulation Details (indicate cue type and reason): anticipate             Vision Baseline Vision/History: 1 Wears glasses Vision Assessment?: Yes Eye Alignment: Impaired (comment) (to the R) Alignment/Gaze Preference: Gaze right;Head turned (purposeful to midline 1x during eval; when in supine improved  midline positioning) Tracking/Visual Pursuits: Impaired - to be further tested in functional context;Other (comment) (inconsistent performance upon screen, pt tracks to stimuli approx 50% of the time) Saccades: Impaired - to be further tested in functional  context Convergence: Impaired - to be further tested in functional context Visual Fields: Impaired-to be further tested in functional context;Other (comment) (potential field cut)     Perception Perception: Impaired Preception Impairment Details: Inattention/Neglect     Praxis Praxis: Impaired Praxis Impairment Details: Perseveration, Motor planning, Organization, Ideomotor Praxis-Other Comments: will continue to assess   Pertinent Vitals/Pain Pain Assessment Pain Assessment: Faces Faces Pain Scale: Hurts a little bit Pain Location: Ears Pain Descriptors / Indicators: Discomfort Pain Intervention(s): Limited activity within patient's tolerance, Monitored during session     Extremity/Trunk Assessment Upper Extremity Assessment Upper Extremity Assessment: LUE deficits/detail;RUE deficits/detail;Difficult to assess due to impaired cognition;Generalized weakness RUE Deficits / Details: RUE grossly 3/5 throughout; some purposeful movement noted RUE Coordination: decreased fine motor LUE Deficits / Details: Flaccid throughout LUE; PROM appears intact; tone not noted on this date LUE Sensation: decreased light touch;decreased proprioception (limited due to cognition however appears impaired cold/warm on LUE; will continue to assess) LUE Coordination: decreased fine motor;decreased gross motor   Lower Extremity Assessment Lower Extremity Assessment: LLE deficits/detail LLE Deficits / Details: L sided neglect LLE Sensation: decreased light touch;decreased proprioception LLE Coordination: decreased gross motor       Communication Communication Communication: Difficulty following commands/understanding;Difficulty communicating thoughts/reduced clarity of speech Following commands: Follows one step commands inconsistently Cueing Techniques: Verbal cues;Gestural cues;Tactile cues   Cognition Arousal: Alert Behavior During Therapy: Flat affect Overall Cognitive Status: No  family/caregiver present to determine baseline cognitive functioning Area of Impairment: Orientation, Memory, Following commands, Safety/judgement, Problem solving                 Orientation Level: Disoriented to, Place, Time, Situation (pt was oriented to hospital after providing re-orientation 1/3x; situation 1/2x)   Memory: Decreased short-term memory, Decreased recall of precautions Following Commands: Follows one step commands inconsistently Safety/Judgement: Decreased awareness of safety, Decreased awareness of deficits   Problem Solving: Slow processing, Decreased initiation, Difficulty sequencing, Requires verbal cues, Requires tactile cues General Comments: Perseverating throughout on various topics; responded to questions approx 50% of the time     General Comments  vitals monitored, stable throughout; edema throughout BLE    Exercises Other Exercises Other Exercises: edu pt re: role of OT, role of rehab   Shoulder Instructions      Home Living Family/patient expects to be discharged to:: Assisted living                                        Prior Functioning/Environment Prior Level of Function : Patient poor historian/Family not available             Mobility Comments: Pt is a poor historian, reports she amb with RW and does not use WC, will need to confirm.          OT Problem List: Decreased strength;Decreased range of motion;Decreased activity tolerance;Impaired balance (sitting and/or standing);Decreased safety awareness;Decreased cognition;Decreased coordination;Impaired vision/perception;Decreased knowledge of use of DME or AE;Decreased knowledge of precautions;Impaired sensation;Impaired tone;Impaired UE functional use      OT Treatment/Interventions: Self-care/ADL training;Therapeutic exercise;Neuromuscular education;Energy conservation;DME and/or AE instruction;Cognitive remediation/compensation;Therapeutic  activities;Splinting;Modalities;Manual therapy;Visual/perceptual remediation/compensation;Patient/family education;Balance training    OT Goals(Current goals can be found in the  care plan section) Acute Rehab OT Goals Patient Stated Goal: go to [facility] OT Goal Formulation: With patient Time For Goal Achievement: 03/15/23 Potential to Achieve Goals: Fair ADL Goals Pt Will Perform Grooming: sitting;with mod assist Pt Will Perform Lower Body Dressing: with mod assist;sitting/lateral leans Pt Will Transfer to Toilet: with max assist;bedside commode;squat pivot transfer Pt Will Perform Toileting - Clothing Manipulation and hygiene: with max assist;sitting/lateral leans  OT Frequency: Min 1X/week    Co-evaluation              AM-PAC OT "6 Clicks" Daily Activity     Outcome Measure Help from another person eating meals?: Total Help from another person taking care of personal grooming?: Total Help from another person toileting, which includes using toliet, bedpan, or urinal?: Total Help from another person bathing (including washing, rinsing, drying)?: Total Help from another person to put on and taking off regular upper body clothing?: A Lot Help from another person to put on and taking off regular lower body clothing?: Total 6 Click Score: 7   End of Session Nurse Communication: Mobility status  Activity Tolerance: Patient tolerated treatment well Patient left: in bed;with call bell/phone within reach;with bed alarm set  OT Visit Diagnosis: Other abnormalities of gait and mobility (R26.89);Unsteadiness on feet (R26.81);Other symptoms and signs involving cognitive function;Cognitive communication deficit (R41.841);Hemiplegia and hemiparesis Hemiplegia - Right/Left: Left Hemiplegia - caused by: Cerebral infarction                Time: 2725-3664 OT Time Calculation (min): 28 min Charges:  OT General Charges $OT Visit: 1 Visit OT Evaluation $OT Eval High Complexity: 1  High  [employer], OTS

## 2023-03-01 NOTE — Consult Note (Signed)
Consultation Note Date: 03/01/2023   Patient Name: Morgan Dixon  DOB: 06/06/1950  MRN: [id number]  Age / Sex: 72 y.o., female  PCP: Mick Sell, MD Referring Physician: Raechel Chute, MD  Reason for Consultation: Establishing goals of care   HPI/Brief Hospital Course: 72 y.o. 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 HLD and obesity s/p gastrectomy admitted from [facility]F on 02/28/2023 s/p fall at facility but on EMS arrival she was found to have AMS, slurred speech and left sided neglect.   On arrival to ED, Code Stroke activated, CT head revealed extensive hypodensity in the right MCA territory, likely cytotoxic edema with 4 mm right-to-left midline shift and possible thrombus to right ICA. CT head/neck confirmed thrombus in right ICA with extension to M1.  While in ED, developed worsening lethargy and found to have worsening midline shift on repeat imaging-admitted to ICU for close neurological monitoring-placed on 3% saline which has now been discontinued  Palliative medicine was consulted for assisting with goals of care conversations. Noted initial GOC conversation had with neuro team, Dr. Iver Nestle and family, per notes risk of further edema and mortality discussed as well as potential long term effects and likely need for feeding tube. No decisions were made at that time as per note family requested time for processing information.  Subjective:  Extensive chart review has been completed prior to meeting patient including labs, vital signs, imaging, progress notes, orders, and available advanced directive documents from current and previous encounters.  Visited with Morgan Dixon at her bedside. She is awake with eyes open, left sided neglect remains with right sided deviation, able to answer simple  questions and responds to calling of her name, unable to engage in complex medical decision making.  Called and spoke with daughter-Junaita who plans to visit at bedside this afternoon, unable to provide exact time but will alert nursing staff when she arrives to reach out to PMT.  Returned to bedside once daughter-Juanita arrived. Met in collaboration with ICU attending and ICU NP. Son also at bedside with other son-Kris available by speaker phone.  Introduced myself as a Publishing rights manager as a member of the palliative care team. Explained palliative medicine is specialized medical care for people living with serious illness. It focuses on providing relief from the symptoms and stress of a serious illness. The goal is to improve quality of life for both the patient and the family.   Dr. Aundria Rud provided family with medical updates. No changes on CT imaging this AM. Hypertonic saline discontinued as it was felt midline shifting was minimal at this time, high concern for worsening edema over weekend.  Dr. Aundria Rud also discussed prognosis at length, anticipating bed bound status with inability to feed herself or safely consume PO which would require PEG placement and SNF placement.  Code status was also discussed, family present made decision for DNR/DNI at this time with plan to continue current medical treatment allowing time for outcomes and having ongoing GOC conversations.  I discussed importance of continued conversations with family/support persons and all members of their medical team regarding overall plan of care and treatment options ensuring decisions are in alignment with patients goals of care.  All questions/concerns addressed. PMT will continue to follow and support patient as needed.  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 (  HCC)   Vital Signs: BP (!) 187/65 (BP Location: Left Arm)   Pulse (!) 49   Temp 98.1 F  (36.7 C) (Oral)   Resp (!) 23   Ht 5\' 6"  (1.676 m)   Wt 109 kg   SpO2 100%   BMI 38.79 kg/m  Pain Scale: PAINAD   Pain Score: 0-No pain   IO: Intake/output summary:  Intake/Output Summary (Last 24 hours) at 03/01/2023 1241 Last data filed at 03/01/2023 1134 Gross per 24 hour  Intake 1229.91 ml  Output 750 ml  Net 479.91 ml    LBM:   Baseline Weight: Weight: 109 kg Most recent weight: Weight: 109 kg      Assessment and Plan  SUMMARY OF RECOMMENDATIONS   DNR/DNI Time for outcomes  Palliative Prophylaxis:   Bowel Regimen, Delirium Protocol and Frequent Pain Assessment  Spiritual:  Desire for ongoing Chaplain support: Not at this time Education provided on Chaplain services offered through PMT, education provided on Grief/Bereavement support services   Discussed With: ICU team and nursing staff   Thank you for this consult and allowing Palliative Medicine to participate in the care of Morgan Dixon. Morgan Dixon. Palliative medicine will continue to follow and assist as needed.   Time Total: 75 minutes  Time spent includes: Detailed review of medical records (labs, imaging, vital signs), medically appropriate exam (mental status, respiratory, cardiac, skin), discussed with treatment team, counseling and educating patient, family and staff, documenting clinical information, medication management and coordination of care.   Signed by: Leeanne Deed, DNP, AGNP-C Palliative Medicine    Please contact Palliative Medicine Team phone at [phone] for questions and concerns.  For individual provider: See Loretha Stapler

## 2023-03-01 NOTE — Progress Notes (Signed)
NAME:  Morgan Dixon, MRN:  [id number], DOB:  15-Feb-1951, LOS: 1 ADMISSION DATE:  02/28/2023, CHIEF COMPLAINT:  Acute Stroke   History of Present Illness:   This is a 72 yo female with a PMH significant for prior left MCA territory cryptogenic embolic stroke with no deficits, loop recorder in place, chronic bradycardia (typical heart rate in the 50's), spinal cord stimulator in place for chronic pain, hypertension, hyperlipidemia, and obesity s/p gastrectomy.  She presented to [facility] ER via EMS from [facility] originally EMS notified for a fall.  However, when EMS arrived on the scene pt noted to have altered mental status, slurred speech, and left sided neglect with right sided deviation.  Her last known well time was on 11/13 at 1700.   ED Course Upon arrival to the ER Code Stroke activated at 1510 tele-neurologist paged.  Per ER notes pt able to verbalize her name, but otherwise inconsistent with hx.  Pt with left facial droop, left hemiparesis with right-sided deviation, and aphasia.  Initial CT Head revealed extensive hypodensity in the right MCA territory, likely cytotoxic edema from acute infarct, with 4 mm right-to-left midline shift along with petechial hemorrhage, no evidence of right hemorrhagic transformation, possible thrombus in the right ICA.  CTA Head/Neck confirmed thrombus in the right ICA with extension into M1.  Per neuro pt outside the window for surgical interventions for thrombus.  While in the ER pt developed worsening lethargy repeat CT Head revealed worsening midline shift.  Per neurology hypertonic saline ordered.  PCCM team contact for ICU admission.    Initial EKG: Sinus bradycardia/heart rate 46, no ST elevation  Significant lab results: glucose 164/PT 15.4   CT Head Code Stroke WO Contrast: Extensive hypodensity in the right MCA territory, likely cytotoxic edema from acute infarct, with 4 mm right-to-left midline shift. Possible petechial hemorrhage.  No evidence of right hemorrhagic transformation. ASPECTS is 0. Hyperdense right MCA, as well as a possible hyperdense right ICA, concerning for thrombus.   CTA Head/Neck W/WO CM:  Findings concerning for thrombus in the right cavernous and supraclinoid ICA, which may extend into the right M1, with minimal opacification of the right MCA and its branches. The right A1 is not opacified but more distal ACAs receive flow from the anterior communicating artery. No hemodynamically significant stenosis in the neck.   Repeat CT Head WO Contrast: Redemonstrated cytotoxic edema from acute infarct, with 7 mm of right-to-left midline shift at the level of the thalami, previously 4 mm. No evidence of hemorrhagic transformation. Possible petechial hemorrhage  Pertinent  Medical History   Left MCA Territory Cryptogenic Embolic Stroke (no deficits) Loop Recorder in Place  Chronic Bradycardia (typical heart rate in the 50's) Spinal Cord Stimulator for Chronic Pain  HTN HLD  Obesity s/p Gastrectomy  Edema   Significant Hospital Events: Including procedures, antibiotic start and stop dates in addition to other pertinent events   11/14: Pt admitted with worsening cytotoxic edema secondary to acute right MCA infarct with 7 mm right-to-left midline shift and thrombus in the right ICA with extension into M1 requiring hypertonic saline  11/15: repeat CT stable, off hypertonic saline  Interim History / Subjective:  Pt protecting her airway noted to have left-sided hemiparesis, mild aphasia, left-sided facial droop, right deviation of left eye. Following commands on the right.  No signs of respiratory distress    Objective   Blood pressure (!) 187/68, pulse (!) 53, temperature 98.1 F (36.7 C), temperature source Oral,  resp. rate (!) 25, height 5\' 6"  (1.676 m), weight 109 kg, SpO2 97%.        Intake/Output Summary (Last 24 hours) at 03/01/2023 1350 Last data filed at 03/01/2023 1134 Gross per 24 hour   Intake 1229.91 ml  Output 750 ml  Net 479.91 ml   Filed Weights   02/28/23 1855 03/01/23 0344  Weight: 109 kg 109 kg    Examination: Physical Exam Constitutional:      Appearance: She is ill-appearing.  Cardiovascular:     Rate and Rhythm: Normal rate and regular rhythm.     Pulses: Normal pulses.     Heart sounds: Normal heart sounds.  Pulmonary:     Effort: Pulmonary effort is normal.     Breath sounds: No wheezing or rales.  Abdominal:     Palpations: Abdomen is soft.  Neurological:     Mental Status: She is alert.     Sensory: Sensory deficit (left hemineglect) present.     Motor: Weakness (left sided hemiplegia) present.     Comments: Follows commands on the right, strength 3/5. Pupils equal and reactive      Assessment & Plan:   72 year old female with history of CVA presenting with AMS found to have a large right MCA stroke admitted to the ICU for further management.  Neurology #R. MCA Stroke #Cerebral Edema #Midline Shift  Last seen well on 11/13 at 5 pm, presented 24 hours later with left sided weakness and hemi-neglect with large right MCA stroke. She's had multiple head CT's showing the evolving stroke with some midline shift prompting initiation of hypertonic saline. She unfortunately presented outside the window for thrombolytic therapy or mechanical thrombectomy. Plan for close neurological monitoring given propensity for the swelling to worsen.  -close neuro-checks -goal sodium 145-150 -goal BP < 220/120 -continue with aspirin -MRI brain -TTE -Palliative care consult  Cardiovascular  Likely cardioembolic stroke. Will allow blood pressure to auto-regulate, with goal BP < 220/120. Awaiting echocardiogram.  Pulmonary  No active issues, high risk for aspiration. Maintain NPO for now. She would not want long term ventilation but would not be opposed to short term ventilation if necessary. They've clearly expressed not wanting a tracheostomy  tube.  Gastrointestinal  High risk for aspiration. Will maintain NPO, but will likely require feeding tube.  Renal  Kidney function is at baseline. We have initiated hypertonic saline yesterday to help alleviate the swelling with goal sodium between 145 and 150. Appreciate input from neurology, holding hypertonic saline for now, will re-initiate should swelling worsen.  -BMP and serum osmolarity q6hours  Endocrine  ICU glycemic protocol.   Hem/Onc  Holding DVT prophylaxis today  ID  No active issues, monitoring for any sign of aspiration.   Best Practice (right click and "Reselect all SmartList Selections" daily)   Diet/type: NPO DVT prophylaxis: SCD GI prophylaxis: N/A Lines: N/A Foley:  N/A Code Status:  full code Last date of multidisciplinary goals of care discussion [03/01/2023]  Labs   CBC: Recent Labs  Lab 02/28/23 1518 03/01/23 0440  WBC 10.1 4.9  NEUTROABS 8.8*  --   HGB 13.1 11.5*  HCT 39.5 33.9*  MCV 94.0 90.9  PLT 184 150    Basic Metabolic Panel: Recent Labs  Lab 02/28/23 1518 02/28/23 1536 02/28/23 1950 02/28/23 2308 03/01/23 0059 03/01/23 0440 03/01/23 0932  NA 140  --  143 147* 147* 150* 153*  K 3.9  --  3.6  --   --  3.5  --  CL 108  --  110  --   --  121*  --   CO2 23  --  23  --   --  19*  --   GLUCOSE 164*  --  128*  --   --  178*  --   BUN 22  --  22  --   --  23  --   CREATININE 0.93 1.00 0.90  --   --  0.71  --   CALCIUM 9.4  --  9.3  --   --  9.1  --   MG  --   --   --   --   --  2.3  --   PHOS  --   --   --   --   --  2.8  --    GFR: Estimated Creatinine Clearance: 79.5 mL/min (by C-G formula based on SCr of 0.71 mg/dL). Recent Labs  Lab 02/28/23 1518 03/01/23 0440  WBC 10.1 4.9    Liver Function Tests: Recent Labs  Lab 02/28/23 1518 03/01/23 0440  AST 33  --   ALT 17  --   ALKPHOS 68  --   BILITOT 2.2*  --   PROT 7.7  --   ALBUMIN 4.3 3.7   No results for input(s): "LIPASE", "AMYLASE" in the last 168  hours. No results for input(s): "AMMONIA" in the last 168 hours.  ABG No results found for: "PHART", "PCO2ART", "PO2ART", "HCO3", "TCO2", "ACIDBASEDEF", "O2SAT"   Coagulation Profile: Recent Labs  Lab 02/28/23 1518  INR 1.2    Cardiac Enzymes: No results for input(s): "CKTOTAL", "CKMB", "CKMBINDEX", "TROPONINI" in the last 168 hours.  HbA1C: Hgb A1c MFr Bld  Date/Time Value Ref Range Status  02/28/2023 07:48 PM 5.3 4.8 - 5.6 % Final    Comment:    (NOTE) Pre diabetes:          5.7%-6.4%  Diabetes:              >6.4%  Glycemic control for   <7.0% adults with diabetes   08/21/2020 02:38 AM 4.9 4.8 - 5.6 % Final    Comment:    (NOTE) Pre diabetes:          5.7%-6.4%  Diabetes:              >6.4%  Glycemic control for   <7.0% adults with diabetes     CBG: Recent Labs  Lab 02/28/23 1850 02/28/23 2357 03/01/23 0404 03/01/23 0749 03/01/23 1121  GLUCAP 123* 133* 158* 180* 146*    Past Medical History:  She,  has a past medical history of Chronic pain and Edema extremities.   Surgical History:   Past Surgical History:  Procedure Laterality Date   ABDOMINAL HYSTERECTOMY  1989   BACK SURGERY     GASTRIC BYPASS     ROBOTIC ASSISTED LAPAROSCOPIC LYSIS OF ADHESION N/A 12/30/2021   Procedure: XI ROBOTIC ASSISTED LAPAROSCOPIC LYSIS OF ADHESION;  Surgeon: Campbell Lerner, MD;  Location: [facility] ORS;  Service: General;  Laterality: N/A;   XI ROBOTIC ASSISTED VENTRAL HERNIA N/A 12/30/2021   Procedure: XI ROBOTIC ASSISTED VENTRAL HERNIA;  Surgeon: Campbell Lerner, MD;  Location: [facility] ORS;  Service: General;  Laterality: N/A;     Social History:   reports that she has never smoked. She has never used smokeless tobacco. She reports that she does not drink alcohol and does not use drugs.   Family History:  Her family history includes Breast cancer (age  of onset: 8) in her maternal aunt; Diabetes in her father; Stroke in her father.   Allergies Allergies  Allergen  Reactions   Bupropion Nausea And Vomiting   Diazepam Rash   Penicillins Itching   Rofecoxib Swelling   Latex Itching    When used gloves in the past   Metformin Other (See Comments)   Prozac [Fluoxetine Hcl] Itching and Rash   Zinc Oxide [Mexsana] Rash     Home Medications  Prior to Admission medications   Medication Sig Start Date End Date Taking? Authorizing Provider  acetaminophen (TYLENOL) 500 MG tablet Take 2 tablets (1,000 mg total) by mouth every 6 (six) hours as needed for moderate pain. 08/27/20   Calvert Cantor, MD  atorvastatin (LIPITOR) 40 MG tablet Take 1 tablet (40 mg total) by mouth daily. 09/05/20 11/15/20  Pokhrel, Rebekah Chesterfield, MD  buprenorphine (SUBUTEX) 8 MG SUBL SL tablet Place 8 mg under the tongue 2 (two) times daily.    [provider]  clopidogrel (PLAVIX) 75 MG tablet Take 75 mg by mouth daily.    [provider]  hydrochlorothiazide (HYDRODIURIL) 25 MG tablet Take 25 mg by mouth daily.    [provider]  losartan (COZAAR) 100 MG tablet Take 1 tablet (100 mg total) by mouth daily. 01/07/22   Arnetha Courser, MD  ondansetron (ZOFRAN-ODT) 4 MG disintegrating tablet Take 1 tablet (4 mg total) by mouth every 8 (eight) hours as needed. 10/02/22   Georga Hacking, MD  pregabalin (LYRICA) 100 MG capsule Take 200 mg by mouth daily. 08/12/20   [provider]  sertraline (ZOLOFT) 100 MG tablet Take 100 mg by mouth at bedtime. 05/18/20   [provider]  tiZANidine (ZANAFLEX) 4 MG tablet Take 4 mg by mouth 2 (two) times daily.    [provider]     Critical care time: 31 minutes    Raechel Chute, MD [facility] 03/01/2023 2:11 PM

## 2023-03-01 NOTE — Progress Notes (Signed)
Per Florentina Addison, RN hold off on PICC insertion as 3% NS has been stopped.  "ICU team okay to defer for now and follow up tomorrow if needed/saline restarted."

## 2023-03-01 NOTE — Consult Note (Addendum)
PHARMACY CONSULT NOTE - Hypertonic Saline  Pharmacy Consult for Hypertonic Saline Monitoring and Management  Recent Labs: Potassium (mmol/L)  Date Value  03/01/2023 3.5  12/11/2012 4.0   Magnesium (mg/dL)  Date Value  78/46/9629 2.3   Calcium (mg/dL)  Date Value  52/84/1324 9.1   Calcium, Total (mg/dL)  Date Value  40/01/2724 9.3   Albumin (g/dL)  Date Value  36/64/4034 3.7   Phosphorus (mg/dL)  Date Value  74/25/9563 2.8   Sodium (mmol/L)  Date Value  03/01/2023 153 (H)  12/11/2012 136   Assessment  Morgan Dixon is a 72 y.o. female presenting with stroke and cerebral edema. PMH significant for chronic pain s/p spinal cord stimulator and cryptogenic embolic stroke in 2022. She presented today with concerns for stroke and LKW was 1700 on 11/13. No thrombolytic was given. Pharmacy now been consulted to monitor hypertonic saline (3%) infusion in the setting of cerebral edema and concerns for impending herniation.  Pertinent medications: N/A  Baseline sodium: 140  Goal of Therapy:  Goal sodium level 145-150 per neurology Not to exceed Na <160  Monitoring:  Date Time Na Rate/Comment  11/15    0059   147      60 mL/hr 11/15    0440   150      30 mL/hr  11/15    0932   153      Holding 3% saline 11/15    1502   152      Holding 3% saline  Plan:  F/u 2100 for restart of 3% saline if necessary Check sodium every 6 hours as ordered by [facility] Notify attending and neurologist if Na >160 Na increases > 6 mEq/L in first 4 hours Na increases > 6 mEq/L in 6 hours   Littie Deeds, PharmD Pharmacy Resident  03/01/2023 12:34 PM

## 2023-03-01 NOTE — Consult Note (Signed)
PHARMACY CONSULT NOTE - Hypertonic Saline  Pharmacy Consult for Hypertonic Saline Monitoring and Management  Recent Labs: Potassium (mmol/L)  Date Value  03/01/2023 3.5  12/11/2012 4.0   Magnesium (mg/dL)  Date Value  16/01/9603 2.3   Calcium (mg/dL)  Date Value  54/12/8117 9.1   Calcium, Total (mg/dL)  Date Value  14/78/2956 9.3   Albumin (g/dL)  Date Value  21/30/8657 3.7   Phosphorus (mg/dL)  Date Value  84/69/6295 2.8   Sodium (mmol/L)  Date Value  03/01/2023 153 (H)  12/11/2012 136   Assessment  Morgan Dixon is a 72 y.o. female presenting with stroke and cerebral edema. PMH significant for chronic pain s/p spinal cord stimulator and cryptogenic embolic stroke in 2022. She presented today with concerns for stroke and LKW was 1700 on 11/13. No thrombolytic was given. Pharmacy now been consulted to monitor hypertonic saline (3%) infusion in the setting of cerebral edema and concerns for impending herniation.  Pertinent medications: N/A  Baseline sodium: 140  Goal of Therapy:  Goal sodium level 145-150 per neurology Not to exceed Na <160  Monitoring:  Date Time Na Rate/Comment  11/15    0059   147      60 mL/hr 11/15    0440   150      30 mL/hr  11/15    0932   153      Holding 3% saline 11/15    1502   152      Holding 3% saline 11/15  2102  153 Holding 3% saline  Plan:  F/u 0300 for restart of 3% saline if necessary Check sodium every 6 hours as ordered by CCM Notify attending and neurologist if Na >160 Na increases > 6 mEq/L in first 4 hours Na increases > 6 mEq/L in 6 hours   Thank you for involving pharmacy in this patient's care.   Rockwell Alexandria, PharmD Clinical Pharmacist 03/01/2023 9:36 PM

## 2023-03-01 NOTE — Progress Notes (Signed)
NEUROLOGY CONSULT FOLLOW UP NOTE   Date of service: March 01, 2023 Patient Name: Morgan Dixon MRN:  [id number] DOB:  23-Oct-1950  Brief HPI  Morgan Dixon is a 72 y.o. female with a past medical history significant for prior left MCA territory cryptogenic embolic stroke, loop recorder in place, baseline bradycardia (typically in the 44s), hypertension, hyperlipidemia, peripheral artery disease, obesity s/p gastrectomy, spinal cord stimulator in place for chronic pain on chronic opiates who presents with left sided weakness and neglect    She lives in an independent living facility and was last seen on 11/13 at around 5 PM. Brought to the ED by EMS when found by staff today.    Interval Hx/subjective  -Disoriented, complaining of pain, has been overall stable  Vitals   Current vital signs: BP (!) 194/77   Pulse (!) 38   Temp 98.1 F (36.7 C) (Oral)   Resp 19   Ht 5\' 6"  (1.676 m)   Wt 109 kg   SpO2 99%   BMI 38.79 kg/m  Vital signs in last 24 hours: Temp:  [98 F (36.7 C)-98.9 F (37.2 C)] 98.1 F (36.7 C) (11/15 0753) Pulse Rate:  [37-200] 38 (11/15 1102) Resp:  [16-25] 19 (11/15 1102) BP: (92-198)/(47-96) 194/77 (11/15 1102) SpO2:  [94 %-100 %] 99 % (11/15 1102) Weight:  [109 kg] 109 kg (11/15 0344)   Body mass index is 38.79 kg/m.  Physical Exam   Constitutional: Appears well-developed and well-nourished. Psych: Restless but cooperative Eyes: No scleral injection.  HENT: No OP obstruction.  Head is turned towards the right side but can be repositioned (quickly turns head back towards the right) Head: Normocephalic.  Cardiovascular: Normal rate and regular rhythm.  Respiratory: Effort normal, non-labored breathing.  GI: Soft.  No distension. There is no tenderness.   Neurologic Examination   Neuro: Mental Status: Awake, alert, reports she is 72 but incorrect month, reports she is at her independent living facility, perseverates on needing to urinate  and having difficulty processing instructions that she has a PureWick in place.  Able to follow some simple commands such as giving a thumbs up Cranial Nerves: II: Visual Fields are notable for a left hemianopia. Pupils are equal, round, and reactive to light.   III,IV, VI: Marked right gaze deviation.  Tracks very slightly, not fully to midline V: Facial sensation is symmetric to eyelash brush VII: Facial movement is notable for a marked left facial droop.  VIII: hearing is intact to voice Motor: Extensor posturing in the left upper extremity, 1/5 spontaneous movement in the left lower extremity.  Spontaneously moving right upper and lower extremity Sensory: Unable to feel stimuli on the left side    Labs and Diagnostic Imaging    Basic Metabolic Panel: Recent Labs  Lab 02/28/23 1518 02/28/23 1536 02/28/23 1950 02/28/23 2308 03/01/23 0059 03/01/23 0440 03/01/23 0932  NA 140  --  143 147* 147* 150* 153*  K 3.9  --  3.6  --   --  3.5  --   CL 108  --  110  --   --  121*  --   CO2 23  --  23  --   --  19*  --   GLUCOSE 164*  --  128*  --   --  178*  --   BUN 22  --  22  --   --  23  --   CREATININE 0.93 1.00 0.90  --   --  0.71  --   CALCIUM 9.4  --  9.3  --   --  9.1  --   MG  --   --   --   --   --  2.3  --   PHOS  --   --   --   --   --  2.8  --     CBC: Recent Labs  Lab 02/28/23 1518 03/01/23 0440  WBC 10.1 4.9  NEUTROABS 8.8*  --   HGB 13.1 11.5*  HCT 39.5 33.9*  MCV 94.0 90.9  PLT 184 150    Coagulation Studies: Recent Labs    02/28/23 1518  LABPROT 15.4*  INR 1.2      Lipid Panel:  Lab Results  Component Value Date   LDLCALC 32 03/01/2023   HgbA1c:  Lab Results  Component Value Date   HGBA1C 5.3 02/28/2023   Urine Drug Screen:     Component Value Date/Time   LABOPIA NONE DETECTED 08/17/2020 1615   COCAINSCRNUR NONE DETECTED 08/17/2020 1615   COCAINSCRNUR NONE DETECTED 05/24/2020 1959   LABBENZ NONE DETECTED 08/17/2020 1615   AMPHETMU  NONE DETECTED 08/17/2020 1615   THCU NONE DETECTED 08/17/2020 1615   LABBARB NONE DETECTED 08/17/2020 1615    Alcohol Level     Component Value Date/Time   ETH <10 02/28/2023 1518   INR  Lab Results  Component Value Date   INR 1.2 02/28/2023   APTT  Lab Results  Component Value Date   APTT 26 02/28/2023   AED levels: No results found for: "PHENYTOIN", "ZONISAMIDE", "LAMOTRIGINE", "LEVETIRACETA"  CT Head without contrast(Personally reviewed) 11/15 8 a.m.: 1. Continued evolution of a likely subacute large right MCA territory infarct. No significant change in associated edema or minimal leftward midline shift. No acute hemorrhage. 2. Unchanged hyperdense right MCA.  CT angio Head and Neck with contrast(Personally reviewed): 1. Findings concerning for thrombus in the right cavernous and supraclinoid ICA, which may extend into the right M1, with minimal opacification of the right MCA and its branches. 2. The right A1 is not opacified but more distal ACAs receive flow from the anterior communicating artery. 3. No hemodynamically significant stenosis in the neck.   Impression   Morgan Dixon is a 72 y.o. female presenting with a large completed right M1 stroke, with cerebral edema and mild midline shift present on admission.  Peak swelling typically 3 to 5 days post initial event and she came in likely 12 to 20 hours from initial event given last known well and head CT findings.  Therefore highest risk for worsening symptoms will be from 11/16 evening through 11/19 morning.  Given clinically stable examination and stable imaging this morning, will adjust sodium goal to 145-150 and continue to monitor closely  Recommendations  # Right MCA embolic stroke # Cerebral edema with midline shift - Stroke labs HgbA1c, fasting lipid panel - MRI brain  - Frequent neuro checks - Echocardiogram - ASA 325 mg daily  - Loop recorder interrogation pending - Risk factor modification -  Telemetry monitoring - Blood pressure goal              - Permissive hypertension to 220/120 due to acute stroke for ~24 hours, will likely start to very gradually normalize tomorrow (11/16) - PT consult, OT consult, Speech consult - Goal sodium 145-150, given trend to 150 had reduced rate to 30 cc/h at 9 AM, given sodium at 9:30 AM resulted at 153 stopped 3% at  1130, will follow-up 1500 sodium level for further rate adjustments - Appreciate CCM following given fluctuating mental status and high risk for worsening - Neurology will follow along closely    # Goals of care - Long discussion with daughter at bedside, including review of images. High risk of further edema; possible risk of death due to stroke size. Discussed at best would be total care due to left sided weakness, but still able to have meaningful conversations, with some difficulty with prosody. Discussed likely to need feeding tube for dysphagia given size of stroke. Family still processing but for now, full scope of care; they know she would not want long term ventilation  - Palliative care consultation  ______________________________________________________________________   Thank you for the opportunity to take part in the care of this patient. If you have any further questions, please contact the neurology consultation team on call. Updated oncall schedule is listed on AMION.  Signed,  Brooke Dare MD-PhD [facility] [phone]  CRITICAL CARE Performed by: Gordy Councilman   Total critical care time: 45 minutes  Critical care time was exclusive of separately billable procedures and treating other patients.  Critical care was necessary to treat or prevent imminent or life-threatening deterioration.  Critical care was time spent personally by me on the following activities: development of treatment plan with patient and/or surrogate as well as nursing, discussions with consultants, evaluation of  patient's response to treatment, examination of patient, obtaining history from patient or surrogate, ordering and performing treatments and interventions, ordering and review of laboratory studies, ordering and review of radiographic studies, pulse oximetry and re-evaluation of patient's condition.

## 2023-03-02 ENCOUNTER — Inpatient Hospital Stay: Payer: Medicare HMO

## 2023-03-02 DIAGNOSIS — R4182 Altered mental status, unspecified: Secondary | ICD-10-CM | POA: Diagnosis not present

## 2023-03-02 DIAGNOSIS — I63231 Cerebral infarction due to unspecified occlusion or stenosis of right carotid arteries: Secondary | ICD-10-CM | POA: Diagnosis not present

## 2023-03-02 DIAGNOSIS — I272 Pulmonary hypertension, unspecified: Secondary | ICD-10-CM

## 2023-03-02 DIAGNOSIS — Z515 Encounter for palliative care: Secondary | ICD-10-CM | POA: Diagnosis not present

## 2023-03-02 LAB — CBC
HCT: 33.4 % — ABNORMAL LOW (ref 36.0–46.0)
Hemoglobin: 11.1 g/dL — ABNORMAL LOW (ref 12.0–15.0)
MCH: 31.4 pg (ref 26.0–34.0)
MCHC: 33.2 g/dL (ref 30.0–36.0)
MCV: 94.6 fL (ref 80.0–100.0)
Platelets: 122 10*3/uL — ABNORMAL LOW (ref 150–400)
RBC: 3.53 MIL/uL — ABNORMAL LOW (ref 3.87–5.11)
RDW: 13.6 % (ref 11.5–15.5)
WBC: 5.7 10*3/uL (ref 4.0–10.5)
nRBC: 0 % (ref 0.0–0.2)

## 2023-03-02 LAB — BASIC METABOLIC PANEL
Anion gap: 7 (ref 5–15)
Anion gap: 8 (ref 5–15)
BUN: 32 mg/dL — ABNORMAL HIGH (ref 8–23)
BUN: 32 mg/dL — ABNORMAL HIGH (ref 8–23)
CO2: 21 mmol/L — ABNORMAL LOW (ref 22–32)
CO2: 22 mmol/L (ref 22–32)
Calcium: 9.3 mg/dL (ref 8.9–10.3)
Calcium: 9.3 mg/dL (ref 8.9–10.3)
Chloride: 126 mmol/L — ABNORMAL HIGH (ref 98–111)
Chloride: 125 mmol/L — ABNORMAL HIGH (ref 98–111)
Creatinine, Ser: 0.83 mg/dL (ref 0.44–1.00)
Creatinine, Ser: 0.86 mg/dL (ref 0.44–1.00)
GFR, Estimated: >60 mL/min (ref >60)
GFR, Estimated: >60 mL/min (ref >60)
Glucose, Bld: 165 mg/dL — ABNORMAL HIGH (ref 70–99)
Glucose, Bld: 144 mg/dL — ABNORMAL HIGH (ref 70–99)
Potassium: 3.3 mmol/L — ABNORMAL LOW (ref 3.5–5.1)
Potassium: 3.4 mmol/L — ABNORMAL LOW (ref 3.5–5.1)
Sodium: 154 mmol/L — ABNORMAL HIGH (ref 135–145)
Sodium: 155 mmol/L — ABNORMAL HIGH (ref 135–145)

## 2023-03-02 LAB — OSMOLALITY
Osmolality: 335 mosm/kg (ref 275–295)
Osmolality: 331 mosm/kg (ref 275–295)
Osmolality: 339 mosm/kg (ref 275–295)

## 2023-03-02 LAB — GLUCOSE, CAPILLARY
Glucose-Capillary: 140 mg/dL — ABNORMAL HIGH (ref 70–99)
Glucose-Capillary: 130 mg/dL — ABNORMAL HIGH (ref 70–99)
Glucose-Capillary: 159 mg/dL — ABNORMAL HIGH (ref 70–99)
Glucose-Capillary: 121 mg/dL — ABNORMAL HIGH (ref 70–99)
Glucose-Capillary: 114 mg/dL — ABNORMAL HIGH (ref 70–99)
Glucose-Capillary: 119 mg/dL — ABNORMAL HIGH (ref 70–99)

## 2023-03-02 LAB — PHOSPHORUS: Phosphorus: 2.8 mg/dL (ref 2.5–4.6)

## 2023-03-02 LAB — SODIUM: Sodium: 155 mmol/L — ABNORMAL HIGH (ref 135–145)

## 2023-03-02 LAB — MAGNESIUM: Magnesium: 2.5 mg/dL — ABNORMAL HIGH (ref 1.7–2.4)

## 2023-03-02 MED ORDER — POTASSIUM CHLORIDE 10 MEQ/100ML IV SOLN
10.0000 meq | INTRAVENOUS | Status: AC
  Administered 2023-03-02 (×2): 10 meq via INTRAVENOUS
  Filled 2023-03-02 (×2): qty 100

## 2023-03-02 NOTE — Progress Notes (Signed)
NEUROLOGY CONSULT FOLLOW UP NOTE   Date of service: March 02, 2023 Patient Name: Morgan Dixon MRN:  [id number] DOB:  [birth year]  Brief HPI  Morgan Dixon is a 72 y.o. female with a past medical history significant for prior left MCA territory cryptogenic embolic stroke, loop recorder in place, baseline bradycardia (typically in the 73s), hypertension, hyperlipidemia, peripheral artery disease, obesity s/p gastrectomy, spinal cord stimulator in place for chronic pain on chronic opiates who presents with left sided weakness and neglect    She lives in an independent living facility and was last seen on 11/13 at around 5 PM. Brought to the ED by EMS when found by staff today.    Interval Hx/subjective  -Sodium rising despite discontinuation of 3% -Code status has been updated to DNR/DNI in discussions with palliative and CCM, appreciate their assistance  -Intermittently brady to the 30s  Vitals   Current vital signs: BP (!) 185/60   Pulse (!) 33   Temp 98.1 F (36.7 C) (Oral)   Resp 19   Ht 5\' 6"  (1.676 m)   Wt 108 kg   SpO2 98%   BMI 38.43 kg/m  Vital signs in last 24 hours: Temp:  [98 F (36.7 C)-98.5 F (36.9 C)] 98.1 F (36.7 C) (11/16 0800) Pulse Rate:  [33-61] 33 (11/16 0800) Resp:  [16-27] 19 (11/16 0800) BP: (149-208)/(54-93) 185/60 (11/16 0800) SpO2:  [95 %-100 %] 98 % (11/16 0800) Weight:  [595 kg] 108 kg (11/16 0448)   Body mass index is 38.43 kg/m.  Physical Exam   Constitutional: Appears ill  Psych: Restless Eyes: No scleral injection.  HENT: No OP obstruction.  Head is turned towards the right side but can be repositioned (quickly turns head back towards the right) Head: Normocephalic.  Cardiovascular: Normal rate and regular rhythm.  Respiratory: Effort normal, non-labored breathing.  GI: Soft.  No distension. There is no tenderness.   Neurologic Examination   Neuro:  Mental Status: Awake, alert, not following commands other than  sticking out tongue, repeating "yes" to everything  Cranial Nerves: II: Visual Fields are notable for a left hemianopia.  III,IV, VI: Marked right gaze deviation.  Tracks very slightly, not fully to midline V: Facial sensation is symmetric to eyelash brush VII: Facial movement is notable for a marked left facial droop.  VIII: hearing is intact to voice Motor: Extensor posturing in the left upper extremity, 1/5 movement in the left lower extremity more brisk to tickle than yesterday.  Spontaneously moving right upper and lower extremity Sensory: More reactive to stimuli on the left than yesterday   Labs and Diagnostic Imaging    Basic Metabolic Panel: Recent Labs  Lab 02/28/23 1518 02/28/23 1536 02/28/23 1950 02/28/23 2308 03/01/23 0440 03/01/23 0932 03/01/23 1502 03/01/23 2102 03/02/23 0316  NA 140  --  143   < > 150* 153* 152* 153* 154*  K 3.9  --  3.6  --  3.5  --   --   --  3.3*  CL 108  --  110  --  121*  --   --   --  126*  CO2 23  --  23  --  19*  --   --   --  21*  GLUCOSE 164*  --  128*  --  178*  --   --   --  165*  BUN 22  --  22  --  23  --   --   --  32*  CREATININE 0.93 1.00 0.90  --  0.71  --   --   --  0.83  CALCIUM 9.4  --  9.3  --  9.1  --   --   --  9.3  MG  --   --   --   --  2.3  --   --   --  2.5*  PHOS  --   --   --   --  2.8  --   --   --  2.8   < > = values in this interval not displayed.    CBC: Recent Labs  Lab 02/28/23 1518 03/01/23 0440 03/02/23 0316  WBC 10.1 4.9 5.7  NEUTROABS 8.8*  --   --   HGB 13.1 11.5* 11.1*  HCT 39.5 33.9* 33.4*  MCV 94.0 90.9 94.6  PLT 184 150 122*    Coagulation Studies: Recent Labs    02/28/23 1518  LABPROT 15.4*  INR 1.2      Lipid Panel:  Lab Results  Component Value Date   LDLCALC 32 03/01/2023   HgbA1c:  Lab Results  Component Value Date   HGBA1C 5.3 02/28/2023   Urine Drug Screen:     Component Value Date/Time   LABOPIA NONE DETECTED 08/17/2020 1615   COCAINSCRNUR NONE DETECTED  08/17/2020 1615   COCAINSCRNUR NONE DETECTED 05/24/2020 1959   LABBENZ NONE DETECTED 08/17/2020 1615   AMPHETMU NONE DETECTED 08/17/2020 1615   THCU NONE DETECTED 08/17/2020 1615   LABBARB NONE DETECTED 08/17/2020 1615    Alcohol Level     Component Value Date/Time   ETH <10 02/28/2023 1518   INR  Lab Results  Component Value Date   INR 1.2 02/28/2023   APTT  Lab Results  Component Value Date   APTT 26 02/28/2023   AED levels: No results found for: "PHENYTOIN", "ZONISAMIDE", "LAMOTRIGINE", "LEVETIRACETA"  CT Head without contrast(Personally reviewed) 11/15 8 a.m.: 1. Continued evolution of a likely subacute large right MCA territory infarct. No significant change in associated edema or minimal leftward midline shift. No acute hemorrhage. 2. Unchanged hyperdense right MCA.  CT angio Head and Neck with contrast(Personally reviewed): 1. Findings concerning for thrombus in the right cavernous and supraclinoid ICA, which may extend into the right M1, with minimal opacification of the right MCA and its branches. 2. The right A1 is not opacified but more distal ACAs receive flow from the anterior communicating artery. 3. No hemodynamically significant stenosis in the neck.   Impression   Morgan Dixon is a 72 y.o. female presenting with a large completed right M1 stroke, with cerebral edema and mild midline shift present on admission.  Peak swelling typically 3 to 5 days post initial event and she came in likely 12 to 20 hours from initial event given last known well and head CT findings.  Therefore highest risk for worsening symptoms will be from 11/16 evening through 11/19 morning.    Exam most likely delirium but given worsening bradycardia will repeat Head CT   Recommendations  # Right MCA embolic stroke # Cerebral edema with midline shift - MRI brain pending clinical stability - Frequent neuro checks - Echocardiogram - ASA 300 mg PR daily (holding pending repeat  Head CT this morning) - Loop recorder interrogation pending - Risk factor modification - Telemetry monitoring - Blood pressure goal              - Permissive hypertension to 220/120 for now - PT consult,  OT consult, Speech consult - Goal sodium 145-150, has continue to gradually uptrend despite holding 3% since 11 AM 11/15, will continue to follow - Repeat Head CT - Appreciate CCM following given fluctuating mental status and high risk for worsening - Neurology will follow along closely    # Goals of care - On admission: long discussion with daughter at bedside, including review of images. High risk of further edema; possible risk of death due to stroke size. Discussed at best would be total care due to left sided weakness, but still able to have meaningful conversations, with some difficulty with prosody. Discussed likely to need feeding tube for dysphagia given size of stroke. Family still processing but for now, full scope of care; they know she would not want long term ventilation  - Palliative care following and CCM also involved, appreciate their efforts; note made of updated code status to DNR/DNI ______________________________________________________________________   Thank you for the opportunity to take part in the care of this patient. If you have any further questions, please contact the neurology consultation team on call. Updated oncall schedule is listed on AMION.  Signed,  Brooke Dare MD-PhD [facility] [phone]  CRITICAL CARE Performed by: Gordy Councilman   Total critical care time: 40 minutes  Critical care time was exclusive of separately billable procedures and treating other patients.  Critical care was necessary to treat or prevent imminent or life-threatening deterioration.  Critical care was time spent personally by me on the following activities: development of treatment plan with patient and/or surrogate as well as nursing, discussions  with consultants, evaluation of patient's response to treatment, examination of patient, obtaining history from patient or surrogate, ordering and performing treatments and interventions, ordering and review of laboratory studies, ordering and review of radiographic studies, pulse oximetry and re-evaluation of patient's condition.

## 2023-03-02 NOTE — Plan of Care (Signed)
  Problem: Education: Goal: Knowledge of General Education information will improve Description: Including pain rating scale, medication(s)/side effects and non-pharmacologic comfort measures Outcome: Not Progressing   Problem: Health Behavior/Discharge Planning: Goal: Ability to manage health-related needs will improve Outcome: Not Progressing   Problem: Clinical Measurements: Goal: Ability to maintain clinical measurements within normal limits will improve Outcome: Not Progressing Goal: Diagnostic test results will improve Outcome: Not Progressing   Problem: Activity: Goal: Risk for activity intolerance will decrease Outcome: Not Progressing   Problem: Nutrition: Goal: Adequate nutrition will be maintained Outcome: Not Progressing   Problem: Coping: Goal: Level of anxiety will decrease Outcome: Not Progressing   Problem: Elimination: Goal: Will not experience complications related to bowel motility Outcome: Not Progressing   Problem: Pain Management: Goal: General experience of comfort will improve Outcome: Not Progressing   Problem: Safety: Goal: Ability to remain free from injury will improve Outcome: Not Progressing   Problem: Skin Integrity: Goal: Risk for impaired skin integrity will decrease Outcome: Not Progressing   Problem: Education: Goal: Knowledge of disease or condition will improve Outcome: Not Progressing Goal: Knowledge of secondary prevention will improve (MUST DOCUMENT ALL) Outcome: Not Progressing Goal: Knowledge of patient specific risk factors will improve Loraine Leriche N/A or DELETE if not current risk factor) Outcome: Not Progressing   Problem: Ischemic Stroke/TIA Tissue Perfusion: Goal: Complications of ischemic stroke/TIA will be minimized Outcome: Not Progressing   Problem: Coping: Goal: Will verbalize positive feelings about self Outcome: Not Progressing Goal: Will identify appropriate support needs Outcome: Not Progressing   Problem:  Health Behavior/Discharge Planning: Goal: Ability to manage health-related needs will improve Outcome: Not Progressing Goal: Goals will be collaboratively established with patient/family Outcome: Not Progressing   Problem: Self-Care: Goal: Ability to participate in self-care as condition permits will improve Outcome: Not Progressing Goal: Verbalization of feelings and concerns over difficulty with self-care will improve Outcome: Not Progressing Goal: Ability to communicate needs accurately will improve Outcome: Not Progressing   Problem: Nutrition: Goal: Risk of aspiration will decrease Outcome: Not Progressing Goal: Dietary intake will improve Outcome: Not Progressing

## 2023-03-02 NOTE — Progress Notes (Signed)
PHARMACY CONSULT NOTE  Pharmacy Consult for Electrolyte Monitoring and Replacement   Recent Labs: Potassium (mmol/L)  Date Value  03/02/2023 3.3 (L)  12/11/2012 4.0   Magnesium (mg/dL)  Date Value  04/54/0981 2.5 (H)   Calcium (mg/dL)  Date Value  19/14/7829 9.3   Calcium, Total (mg/dL)  Date Value  56/21/3086 9.3   Albumin (g/dL)  Date Value  57/84/6962 3.7   Phosphorus (mg/dL)  Date Value  95/28/4132 2.8   Sodium (mmol/L)  Date Value  03/02/2023 155 (H)  12/11/2012 136     Assessment: 72 y.o. female presenting with stroke and cerebral edema. PMH significant for chronic pain s/p spinal cord stimulator and cryptogenic embolic stroke in 2022.   Goal of Therapy:  Electrolytes WNL  Plan:  ---10 mEq IV KCl x 2 ---recheck electrolytes in am  Lowella Bandy ,PharmD Clinical Pharmacist 03/02/2023 10:48 AM

## 2023-03-02 NOTE — Progress Notes (Signed)
Daily Progress Note   Patient Name: Morgan Dixon       Date: 03/02/2023 DOB: Jul 25, 1950  Age: 72 y.o. MRN#: [id number] Attending Physician: Raechel Chute, MD Primary Care Physician: Mick Sell, MD Admit Date: 02/28/2023  Reason for Consultation/Follow-up: Establishing goals of care  HPI/Brief Hospital Review: 72 y.o. 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 HLD and obesity s/p gastrectomy admitted from [facility] on 02/28/2023 s/p fall at facility but on EMS arrival she was found to have AMS, slurred speech and left sided neglect.    On arrival to ED, Code Stroke activated, CT head revealed extensive hypodensity in the right MCA territory, likely cytotoxic edema with 4 mm right-to-left midline shift and possible thrombus to right ICA. CT head/neck confirmed thrombus in right ICA with extension to M1.   While in ED, developed worsening lethargy and found to have worsening midline shift on repeat imaging-admitted to ICU for close neurological monitoring-placed on 3% saline which has now been discontinued   Palliative medicine was consulted for assisting with goals of care conversations. Noted initial GOC conversation had with neuro team, Dr. Iver Nestle and family, per notes risk of further edema and mortality discussed as well as potential long term effects and likely need for feeding tube. No decisions were made at that time as per note family requested time for processing information.  Subjective: Extensive chart review has been completed prior to meeting patient including labs, vital signs, imaging, progress notes, orders, and available  advanced directive documents from current and previous encounters.    Visited with Ms. Stanfill at her bedside.  She was resting in bed with her eyes closed and did not acknowledge my presence in room.  Daughter Dorann Lodge at bedside during time of my visit. Shared with Juanita repeat CT head from this AM remained stable. Mentation continues to wax and wane. Per nursing, Ms. Glazer was able to engage in therapy exercises this AM.  Daughter shares she remains hopeful for recovery but is prepared for the possible outcome that Ms. Quirke will have poor functional status and likely to require artificial nutrition and hydration for which she shares her mother would not be accepting of.  Continued time  for outcomes prior to making final goals of care decisions.  Answered and addressed all questions and concerns.  PMT to continue to follow for ongoing needs and support.  Care plan was discussed with nursing staff.  Thank you for allowing the Palliative Medicine Team to assist in the care of this patient.  Total time:  25 minutes  Time spent includes: Detailed review of medical records (labs, imaging, vital signs), medically appropriate exam (mental status, respiratory, cardiac, skin), discussed with treatment team, counseling and educating patient, family and staff, documenting clinical information, medication management and coordination of care.  Leeanne Deed, DNP, AGNP-C Palliative Medicine   Please contact Palliative Medicine Team phone at [phone] for questions and concerns.

## 2023-03-02 NOTE — Progress Notes (Signed)
NAME:  Morgan Dixon, MRN:  [id number], DOB:  May 13, 1950, LOS: 2 ADMISSION DATE:  02/28/2023, CHIEF COMPLAINT:  Acute Stroke   History of Present Illness:   This is a 72 yo female with a PMH significant for prior left MCA territory cryptogenic embolic stroke with no deficits, loop recorder in place, chronic bradycardia (typical heart rate in the 50's), spinal cord stimulator in place for chronic pain, hypertension, hyperlipidemia, and obesity s/p gastrectomy.  She presented to [facility] ER via EMS from [facility] originally EMS notified for a fall.  However, when EMS arrived on the scene pt noted to have altered mental status, slurred speech, and left sided neglect with right sided deviation.  Her last known well time was on 11/13 at 1700.   ED Course Upon arrival to the ER Code Stroke activated at 1510 tele-neurologist paged.  Per ER notes pt able to verbalize her name, but otherwise inconsistent with hx.  Pt with left facial droop, left hemiparesis with right-sided deviation, and aphasia.  Initial CT Head revealed extensive hypodensity in the right MCA territory, likely cytotoxic edema from acute infarct, with 4 mm right-to-left midline shift along with petechial hemorrhage, no evidence of right hemorrhagic transformation, possible thrombus in the right ICA.  CTA Head/Neck confirmed thrombus in the right ICA with extension into M1.  Per neuro pt outside the window for surgical interventions for thrombus.  While in the ER pt developed worsening lethargy repeat CT Head revealed worsening midline shift.  Per neurology hypertonic saline ordered.  PCCM team contact for ICU admission.    Initial EKG: Sinus bradycardia/heart rate 46, no ST elevation  Significant lab results: glucose 164/PT 15.4   CT Head Code Stroke WO Contrast: Extensive hypodensity in the right MCA territory, likely cytotoxic edema from acute infarct, with 4 mm right-to-left midline shift. Possible petechial hemorrhage.  No evidence of right hemorrhagic transformation. ASPECTS is 0. Hyperdense right MCA, as well as a possible hyperdense right ICA, concerning for thrombus.   CTA Head/Neck W/WO CM:  Findings concerning for thrombus in the right cavernous and supraclinoid ICA, which may extend into the right M1, with minimal opacification of the right MCA and its branches. The right A1 is not opacified but more distal ACAs receive flow from the anterior communicating artery. No hemodynamically significant stenosis in the neck.   Repeat CT Head WO Contrast: Redemonstrated cytotoxic edema from acute infarct, with 7 mm of right-to-left midline shift at the level of the thalami, previously 4 mm. No evidence of hemorrhagic transformation. Possible petechial hemorrhage  Pertinent  Medical History   Left MCA Territory Cryptogenic Embolic Stroke (no deficits) Loop Recorder in Place  Chronic Bradycardia (typical heart rate in the 50's) Spinal Cord Stimulator for Chronic Pain  HTN HLD  Obesity s/p Gastrectomy  Edema   Significant Hospital Events: Including procedures, antibiotic start and stop dates in addition to other pertinent events   11/14: Pt admitted with worsening cytotoxic edema secondary to acute right MCA infarct with 7 mm right-to-left midline shift and thrombus in the right ICA with extension into M1 requiring hypertonic saline  11/15: repeat CT stable, off hypertonic saline 11/16: Waxing and waning mental status, repeat head CT stable  Interim History / Subjective:  Continues to protect airway, follows simple commands.  Objective   Blood pressure (!) 213/100, pulse (!) 54, temperature 98.1 F (36.7 C), temperature source Oral, resp. rate (!) 25, height 5\' 6"  (1.676 m), weight 108 kg, SpO2 100%.  Intake/Output Summary (Last 24 hours) at 03/02/2023 1451 Last data filed at 03/02/2023 1134 Gross per 24 hour  Intake 242.43 ml  Output 925 ml  Net -682.57 ml   Filed Weights   02/28/23 1855  03/01/23 0344 03/02/23 0448  Weight: 109 kg 109 kg 108 kg    Examination: Physical Exam Constitutional:      Appearance: She is ill-appearing.  Cardiovascular:     Rate and Rhythm: Normal rate and regular rhythm.     Pulses: Normal pulses.     Heart sounds: Normal heart sounds.  Pulmonary:     Effort: Pulmonary effort is normal.     Breath sounds: No wheezing or rales.  Abdominal:     Palpations: Abdomen is soft.  Musculoskeletal:     Right lower leg: Edema present.     Left lower leg: Edema present.  Neurological:     Mental Status: She is alert.     Sensory: Sensory deficit (left hemineglect) present.     Motor: Weakness (left sided hemiplegia) present.     Comments: Follows commands on the right, withdraws to pain in the left lower extremity. strength 3/5. Pupils equal and reactive      Assessment & Plan:   72 year old female with history of CVA presenting with AMS found to have a large right MCA stroke admitted to the ICU for further management.  Neurology #R. MCA Stroke #Cerebral Edema #Midline Shift  Last seen well on 11/13 at 5 pm, presented 24 hours later with left sided weakness and hemi-neglect with large right MCA stroke. She's had multiple head CT's showing the evolving stroke with some midline shift prompting initiation of hypertonic saline (has since been discontinued). She unfortunately presented outside the window for thrombolytic therapy or mechanical thrombectomy. Plan for close neurological monitoring given propensity for the swelling to worsen.  -close neuro-checks -goal sodium 145-150 -goal BP < 220/120 -continue with aspirin -MRI brain when able -TTE > no shunt -Palliative care consult  Cardiovascular  #Pulmonary Hypertension  Noted to have an enlarged RV with a negative bubble study. No valvulopathy noted, but LV is enlarged so suspect a component of HFpEF contributing elevated PASP. On exam has findings of stasis dermatitis suggesting long  standing HFpEF. Allowing blood pressure to auto-regulate with goal < 220/120.   Pulmonary  No active issues, high risk for aspiration. Maintain NPO for now. She would not want long term ventilation but would not be opposed to short term ventilation if necessary. They've clearly expressed not wanting a tracheostomy tube.  Gastrointestinal  High risk for aspiration. Will maintain NPO, but will likely require feeding tube.  Renal  Kidney function is at baseline. She received hypertonic saline briefly on presentation and we've since allowed her sodium to auto-regulate, currently at 155. Appreciate input from neurology, holding hypertonic saline for now, will re-initiate should swelling worsen.  -Closely monitoring BMP and osm > switch to BMP BID  Endocrine  ICU glycemic protocol.   Hem/Onc  Holding DVT prophylaxis. Continue Aspirin  ID  No active issues, monitoring for any sign of aspiration.  Other - family (daughter and two sons) updated at the bedside. They confirm DNR/DNI status. Will continue with medical management, assessing for swelling.  Best Practice (right click and "Reselect all SmartList Selections" daily)   Diet/type: NPO DVT prophylaxis: SCD GI prophylaxis: N/A Lines: N/A Foley:  N/A Code Status:  DNR Last date of multidisciplinary goals of care discussion [03/02/2023]  Labs   CBC: Recent  Labs  Lab 02/28/23 1518 03/01/23 0440 03/02/23 0316  WBC 10.1 4.9 5.7  NEUTROABS 8.8*  --   --   HGB 13.1 11.5* 11.1*  HCT 39.5 33.9* 33.4*  MCV 94.0 90.9 94.6  PLT 184 150 122*    Basic Metabolic Panel: Recent Labs  Lab 02/28/23 1518 02/28/23 1536 02/28/23 1950 02/28/23 2308 03/01/23 0440 03/01/23 0932 03/01/23 1502 03/01/23 2102 03/02/23 0316 03/02/23 0952  NA 140  --  143   < > 150* 153* 152* 153* 154* 155*  K 3.9  --  3.6  --  3.5  --   --   --  3.3*  --   CL 108  --  110  --  121*  --   --   --  126*  --   CO2 23  --  23  --  19*  --   --   --   21*  --   GLUCOSE 164*  --  128*  --  178*  --   --   --  165*  --   BUN 22  --  22  --  23  --   --   --  32*  --   CREATININE 0.93 1.00 0.90  --  0.71  --   --   --  0.83  --   CALCIUM 9.4  --  9.3  --  9.1  --   --   --  9.3  --   MG  --   --   --   --  2.3  --   --   --  2.5*  --   PHOS  --   --   --   --  2.8  --   --   --  2.8  --    < > = values in this interval not displayed.   GFR: Estimated Creatinine Clearance: 76.2 mL/min (by C-G formula based on SCr of 0.83 mg/dL). Recent Labs  Lab 02/28/23 1518 03/01/23 0440 03/02/23 0316  WBC 10.1 4.9 5.7    Liver Function Tests: Recent Labs  Lab 02/28/23 1518 03/01/23 0440  AST 33  --   ALT 17  --   ALKPHOS 68  --   BILITOT 2.2*  --   PROT 7.7  --   ALBUMIN 4.3 3.7   No results for input(s): "LIPASE", "AMYLASE" in the last 168 hours. No results for input(s): "AMMONIA" in the last 168 hours.  ABG No results found for: "PHART", "PCO2ART", "PO2ART", "HCO3", "TCO2", "ACIDBASEDEF", "O2SAT"   Coagulation Profile: Recent Labs  Lab 02/28/23 1518  INR 1.2    Cardiac Enzymes: No results for input(s): "CKTOTAL", "CKMB", "CKMBINDEX", "TROPONINI" in the last 168 hours.  HbA1C: Hgb A1c MFr Bld  Date/Time Value Ref Range Status  02/28/2023 07:48 PM 5.3 4.8 - 5.6 % Final    Comment:    (NOTE) Pre diabetes:          5.7%-6.4%  Diabetes:              >6.4%  Glycemic control for   <7.0% adults with diabetes   08/21/2020 02:38 AM 4.9 4.8 - 5.6 % Final    Comment:    (NOTE) Pre diabetes:          5.7%-6.4%  Diabetes:              >6.4%  Glycemic control for   <7.0% adults with diabetes  CBG: Recent Labs  Lab 03/01/23 1950 03/01/23 2318 03/02/23 0347 03/02/23 0740 03/02/23 1130  GLUCAP 138* 135* 140* 130* 159*    Past Medical History:  She,  has a past medical history of Chronic pain and Edema extremities.   Surgical History:   Past Surgical History:  Procedure Laterality Date   ABDOMINAL  HYSTERECTOMY  1989   BACK SURGERY     GASTRIC BYPASS     ROBOTIC ASSISTED LAPAROSCOPIC LYSIS OF ADHESION N/A 12/30/2021   Procedure: XI ROBOTIC ASSISTED LAPAROSCOPIC LYSIS OF ADHESION;  Surgeon: Campbell Lerner, MD;  Location: [facility] ORS;  Service: General;  Laterality: N/A;   XI ROBOTIC ASSISTED VENTRAL HERNIA N/A 12/30/2021   Procedure: XI ROBOTIC ASSISTED VENTRAL HERNIA;  Surgeon: Campbell Lerner, MD;  Location: [facility] ORS;  Service: General;  Laterality: N/A;     Social History:   reports that she has never smoked. She has never used smokeless tobacco. She reports that she does not drink alcohol and does not use drugs.   Family History:  Her family history includes Breast cancer (age of onset: 11) in her maternal aunt; Diabetes in her father; Stroke in her father.   Allergies Allergies  Allergen Reactions   Bupropion Nausea And Vomiting   Diazepam Rash   Penicillins Itching   Rofecoxib Swelling   Latex Itching    When used gloves in the past   Metformin Other (See Comments)   Prozac [Fluoxetine Hcl] Itching and Rash   Zinc Oxide [Mexsana] Rash     Home Medications  Prior to Admission medications   Medication Sig Start Date End Date Taking? Authorizing Provider  acetaminophen (TYLENOL) 500 MG tablet Take 2 tablets (1,000 mg total) by mouth every 6 (six) hours as needed for moderate pain. 08/27/20   Calvert Cantor, MD  atorvastatin (LIPITOR) 40 MG tablet Take 1 tablet (40 mg total) by mouth daily. 09/05/20 11/15/20  Pokhrel, Rebekah Chesterfield, MD  buprenorphine (SUBUTEX) 8 MG SUBL SL tablet Place 8 mg under the tongue 2 (two) times daily.    [provider]  clopidogrel (PLAVIX) 75 MG tablet Take 75 mg by mouth daily.    [provider]  hydrochlorothiazide (HYDRODIURIL) 25 MG tablet Take 25 mg by mouth daily.    [provider]  losartan (COZAAR) 100 MG tablet Take 1 tablet (100 mg total) by mouth daily. 01/07/22   Arnetha Courser, MD  ondansetron (ZOFRAN-ODT) 4 MG  disintegrating tablet Take 1 tablet (4 mg total) by mouth every 8 (eight) hours as needed. 10/02/22   Georga Hacking, MD  pregabalin (LYRICA) 100 MG capsule Take 200 mg by mouth daily. 08/12/20   [provider]  sertraline (ZOLOFT) 100 MG tablet Take 100 mg by mouth at bedtime. 05/18/20   [provider]  tiZANidine (ZANAFLEX) 4 MG tablet Take 4 mg by mouth 2 (two) times daily.    [provider]     Critical care time: 35 minutes    Raechel Chute, MD [facility] 03/02/2023 2:58 PM

## 2023-03-02 NOTE — Evaluation (Addendum)
Speech Language Pathology Evaluation Patient Details Name: Morgan Dixon MRN: [id number] DOB: 09/27/1950 Today's Date: 03/02/2023 Time: 1100-1135 SLP Time Calculation (min) (ACUTE ONLY): 35 min  Problem List:  Patient Active Problem List   Diagnosis Date Noted   Acute stroke due to occlusion of right carotid artery (HCC) 02/28/2023   Lower limb ulcer, calf, left, limited to breakdown of skin (HCC) 11/27/2022   PAD (peripheral artery disease) (HCC) 08/28/2022   Lymphedema 08/28/2022   Status post laparoscopic hernia repair 01/30/2022   Obesity 08/27/2020   Benign essential HTN 08/27/2020   CVA (cerebral vascular accident) (HCC) 08/24/2020   Acute metabolic encephalopathy 08/18/2020   Acute lower UTI 08/17/2020   Hypokalemia 08/17/2020   Left renal mass 08/17/2020   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   Bilateral lower extremity edema 12/12/2017   Pain in both lower extremities 12/12/2017   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/16/2017   Chronic, continuous use of opioids 02/14/2015   Chronic pain 12/05/2012   Past Medical History:  Past Medical History:  Diagnosis Date   Chronic pain    Edema extremities    Past Surgical History:  Past Surgical History:  Procedure Laterality Date   ABDOMINAL HYSTERECTOMY  1989   BACK SURGERY     GASTRIC BYPASS     ROBOTIC ASSISTED LAPAROSCOPIC LYSIS OF ADHESION N/A 12/30/2021   Procedure: XI ROBOTIC ASSISTED LAPAROSCOPIC LYSIS OF ADHESION;  Surgeon: Campbell Lerner, MD;  Location: [facility] ORS;  Service: General;  Laterality: N/A;   XI ROBOTIC ASSISTED VENTRAL HERNIA N/A 12/30/2021   Procedure: XI ROBOTIC ASSISTED VENTRAL HERNIA;  Surgeon: Campbell Lerner, MD;  Location: [facility] ORS;  Service: General;  Laterality: N/A;   HPI:  Pt is a 72 y.o. female who resided at an independent living facility prior with PMH including:  prior left MCA territory cryptogenic embolic stroke, loop recorder in place, baseline bradycardia (typically in  the 66s), hypertension, hyperlipidemia, peripheral artery disease, Obesity s/p gastrectomy, spinal cord stimulator in place for chronic pain on chronic opiates who presents with left sided weakness and neglect.   Head CTs revealed:  Extensive hypodensity in the right MCA territory, involving  the right frontal, temporal, and parietal lobes, as well as the  right basal ganglia, likely cytotoxic edema, with narrowing of the  right lateral ventricle and third ventricle.  Redemonstrated cytotoxic edema from acute infarct, with 7 mm of  right-to-left midline shift at the level of the thalami, previously  4 mm. No evidence of hemorrhagic transformation. Petechial hemorrhage.(2nd exam).   Another f/u Head CT today: Stable swelling at the right MCA distribution infarct. Midline shift  is minimal. Suspicion of Delirium.   Per Neurology MD note: "pt presenting with a large completed right M1 stroke, with cerebral edema and mild midline shift present on admission.  Peak swelling typically 3 to 5 days post initial event and she came in likely 12 to 20 hours from initial event given last known well and head CT findings.  Therefore highest risk for worsening symptoms will be from 11/16 evening through 11/19 morning.".   Neurology has been meeting w/ Family re: the extensiveness of pt's type of stroke and the "likely to need feeding tube for dysphagia", as well as other prognostic points. Pt is a DNR/DNI code now.     LABS today include: wbc 5.7; Sodium 155. on RA. afebrile. Intermittently brady to the 30s.     Assessment / Plan / Recommendation Clinical Impression   Pt seen for  informal initiation of/assessment of current Cognitive-communication presentation. Please see Neurology notes and discussions (as above) re: pt's neurological presentation and impact of new CVA(large right MCA w/ associated edema). Neurology is following closely. Palliative Care is involved.  Pt remains NPO.   Pt presented w/ intermittent  awaking; sternal rub given x4-5 to realert pt to tasks. Verbal stim given consistently. Noted min+ fidgity motor agitation -- NSG endorsed ongoing all morning. Few verbal responses in form of yes/no only to direct question w/ F:2-3(ie, is your name__?, is your name Morgan Dixon?).   Pt was able to ID her name as Morgan Dixon w/ Y/N format; and ID that this SLP was washing her face/brushing gums/lotioning her hands via Y/N format. She required constant MAX verbal/tactile cues w/ pauses b/t tasks. Eyes remained closed the majority of session. She did not follow any 1 step commands. Oral care was given via swabs w/ strong, reflexive lingual bunching/guarding behaviors noted. Open-mouth posture noted at rest; dry tongue. Pt did not follow any OM commands.  R gaze preference; flaccid LUE.  As she became motor fidgity/agitated again, session stopped to lessen over-stimulation and stress in hopes to lessen agitation.   Recommend holding on further cognitive-linguistic assessment and tx at this time w/ Family and Staff to serve as support for language stimulation in a casual/comfortable manner at the bedside during the next ~48 hours (the "highest risk for worsening symptoms will be from 11/16 evening through 11/19 morning") d/t the cytotoxic edema from acute R MCA, as per Neurology notes.  ST services will f/u on Monday-Tuesday w/ further assessment per pt's status. Recommend frequent oral care for hygiene and stimulation of swallowing. NSG updated and agreed.    SLP Assessment  SLP Recommendation/Assessment: Patient needs continued Speech Lanaguage Pathology Services (at D/C also) SLP Visit Diagnosis: Cognitive communication deficit (R41.841) (further TBD)    Recommendations for follow up therapy are one component of a multi-disciplinary discharge planning process, led by the attending physician.  Recommendations may be updated based on patient status, additional functional criteria and insurance authorization.    Follow Up  Recommendations  Follow physician's recommendations for discharge plan and follow up therapies    Assistance Recommended at Discharge  Frequent or constant Supervision/Assistance  Functional Status Assessment Patient has had a recent decline in their functional status and/or demonstrates limited ability to make significant improvements in function in a reasonable and predictable amount of time (TBD as per MD notes)  Frequency and Duration min 2x/week  2 weeks      SLP Evaluation Cognition  Overall Cognitive Status: No family/caregiver present to determine baseline cognitive functioning Arousal/Alertness: Lethargic (Alert to stim given) Orientation Level: Oriented to person Attention: Focused;Sustained Focused Attention: Impaired Focused Attention Impairment: Verbal basic Sustained Attention: Impaired Sustained Attention Impairment: Verbal basic Awareness: Impaired Behaviors: Restless;Physical agitation (Lethargy) Rancho Mirant Scales of Cognitive Functioning: Confused/Agitated: Maximal Assistance (/III)       Comprehension  Auditory Comprehension Overall Auditory Comprehension:  (NT) Visual Recognition/Discrimination Discrimination: Not tested Reading Comprehension Reading Status: Not tested    Expression Expression Primary Mode of Expression: Verbal Verbal Expression Overall Verbal Expression:  (NT) Written Expression Written Expression: Not tested   Oral / Motor  Oral Motor/Sensory Function Overall Oral Motor/Sensory Function:  (impairment -- oral/lingual reactive behavior to direct stim but could not follow instructions for OMEs)              Jerilynn Som, MS, CCC-SLP Speech Language Pathologist Rehab Services; [facility] [place] [phone] (ascom)  Ngan Qualls 03/02/2023, 1:18 PM

## 2023-03-02 NOTE — Evaluation (Signed)
Physical Therapy Evaluation Patient Details Name: Morgan Dixon MRN: [id number] DOB: 09-Dec-1950 Today's Date: 03/02/2023  History of Present Illness  Pt is a 72 y.o. female, with PMH including  prior left MCA territory cryptogenic embolic stroke, loop recorder in place, baseline bradycardia (typically in the 5s), hypertension, hyperlipidemia, peripheral artery disease, obesity s/p gastrectomy, spinal cord stimulator in place for chronic pain on chronic opiates who presents with left sided weakness and neglect.   Clinical Impression  Patient lethargic, able to open eyes one or two times on command but primarily kept them closed. Able to stated her name and birthday with extended time and repetition of command, as well as name some family members (not currently present, confirmed in chart). Pt did not provide PLOF but per OT eval yesterday pt stated she was ambulatory with rollator. Pt noted for LUE and LLE deficits, unable to move LLE on command as well as L neglect. maxA to come to sitting EOB and repositioning but with time and BLE support able to sit with CGA. Also able to show resistance to perturbations and right reactions, as well as returning to midline from propped on RUE. Returned to supine maxAx2.  Overall the patient demonstrated deficits (see "PT Problem List") that impede the patient's functional abilities, safety, and mobility and would benefit from skilled PT intervention.          If plan is discharge home, recommend the following: Two people to help with walking and/or transfers;Two people to help with bathing/dressing/bathroom;Direct supervision/assist for medications management;Help with stairs or ramp for entrance;Assist for transportation;Assistance with feeding;Assistance with cooking/housework;Direct supervision/assist for financial management;Supervision due to cognitive status   Can travel by private vehicle   No    Equipment Recommendations Other (comment) (TBD at  next venue of care)  Recommendations for Other Services       Functional Status Assessment Patient has had a recent decline in their functional status and demonstrates the ability to make significant improvements in function in a reasonable and predictable amount of time.     Precautions / Restrictions Precautions Precautions: Fall Restrictions Weight Bearing Restrictions: No      Mobility  Bed Mobility Overal bed mobility: Needs Assistance Bed Mobility: Supine to Sit, Sit to Supine Rolling: +2 for safety/equipment, Independent   Supine to sit: Max assist, HOB elevated Sit to supine: Max assist, +2 for physical assistance        Transfers                        Ambulation/Gait                  Stairs            Wheelchair Mobility     Tilt Bed    Modified Rankin (Stroke Patients Only)       Balance Overall balance assessment: Needs assistance Sitting-balance support: Feet supported Sitting balance-Leahy Scale: Fair Sitting balance - Comments: progressed to sitting with fair balance after positioned in midline and extended time. able to perform righting reactions posteriorly, resistant to perturbations, able to sit upright from propping on RUE                                     Pertinent Vitals/Pain Pain Assessment Pain Assessment: Faces Faces Pain Scale: No hurt    Home Living Family/patient expects to be discharged to:: Assisted  living                        Prior Function Prior Level of Function : Patient poor historian/Family not available             Mobility Comments: per OT documentation, pt stated she ambulates with RW but pt did not tell PT herself       Extremity/Trunk Assessment   Upper Extremity Assessment Upper Extremity Assessment: Defer to OT evaluation    Lower Extremity Assessment LLE Deficits / Details: L sided neglect. unable to fully assess due to cognition but unable to  move LLE on command , unable to perform sensation testing.       Communication      Cognition Arousal: Lethargic Behavior During Therapy: Flat affect Overall Cognitive Status: No family/caregiver present to determine baseline cognitive functioning Area of Impairment: Orientation, Memory, Following commands, Safety/judgement, Problem solving                     Memory: Decreased short-term memory, Decreased recall of precautions Following Commands: Follows one step commands inconsistently Safety/Judgement: Decreased awareness of safety, Decreased awareness of deficits   Problem Solving: Slow processing, Decreased initiation, Difficulty sequencing, Requires verbal cues, Requires tactile cues General Comments: did not speak much to therapist today, but able to report her name, her birthday, and her families names correctly. followed 1 step commands with extra time, intermittently        General Comments      Exercises     Assessment/Plan    PT Assessment Patient needs continued PT services  PT Problem List Decreased strength;Decreased coordination;Decreased range of motion;Decreased cognition;Decreased knowledge of use of DME;Decreased activity tolerance;Decreased balance;Decreased safety awareness;Decreased mobility;Decreased knowledge of precautions       PT Treatment Interventions DME instruction;Neuromuscular re-education;Gait training;Cognitive remediation;Patient/family education;Stair training;Functional mobility training;Therapeutic activities;Therapeutic exercise;Balance training    PT Goals (Current goals can be found in the Care Plan section)  Acute Rehab PT Goals PT Goal Formulation: Patient unable to participate in goal setting Time For Goal Achievement: 03/16/23 Potential to Achieve Goals: Good    Frequency Min 1X/week     Co-evaluation               AM-PAC PT "6 Clicks" Mobility  Outcome Measure Help needed turning from your back to your  side while in a flat bed without using bedrails?: A Lot Help needed moving from lying on your back to sitting on the side of a flat bed without using bedrails?: A Lot Help needed moving to and from a bed to a chair (including a wheelchair)?: Total Help needed standing up from a chair using your arms (e.g., wheelchair or bedside chair)?: Total Help needed to walk in hospital room?: Total Help needed climbing 3-5 steps with a railing? : Total 6 Click Score: 8    End of Session   Activity Tolerance: Patient tolerated treatment well Patient left: in bed;with call bell/phone within reach;with bed alarm set Nurse Communication: Mobility status PT Visit Diagnosis: Other abnormalities of gait and mobility (R26.89);Difficulty in walking, not elsewhere classified (R26.2);Muscle weakness (generalized) (M62.81)    Time: 1610-9604 PT Time Calculation (min) (ACUTE ONLY): 12 min   Charges:   PT Evaluation $PT Eval Moderate Complexity: 1 Mod PT Treatments $Therapeutic Activity: 8-22 mins PT General Charges $$ ACUTE PT VISIT: 1 Visit        Olga Coaster PT, DPT 11:54 AM,03/02/23

## 2023-03-03 DIAGNOSIS — I639 Cerebral infarction, unspecified: Secondary | ICD-10-CM

## 2023-03-03 DIAGNOSIS — I63231 Cerebral infarction due to unspecified occlusion or stenosis of right carotid arteries: Secondary | ICD-10-CM | POA: Diagnosis not present

## 2023-03-03 DIAGNOSIS — Z515 Encounter for palliative care: Secondary | ICD-10-CM | POA: Diagnosis not present

## 2023-03-03 LAB — BASIC METABOLIC PANEL
Anion gap: 10 (ref 5–15)
Anion gap: 9 (ref 5–15)
BUN: 31 mg/dL — ABNORMAL HIGH (ref 8–23)
BUN: 28 mg/dL — ABNORMAL HIGH (ref 8–23)
CO2: 21 mmol/L — ABNORMAL LOW (ref 22–32)
CO2: 22 mmol/L (ref 22–32)
Calcium: 9.2 mg/dL (ref 8.9–10.3)
Calcium: 9.2 mg/dL (ref 8.9–10.3)
Chloride: 121 mmol/L — ABNORMAL HIGH (ref 98–111)
Chloride: 121 mmol/L — ABNORMAL HIGH (ref 98–111)
Creatinine, Ser: 0.78 mg/dL (ref 0.44–1.00)
Creatinine, Ser: 0.78 mg/dL (ref 0.44–1.00)
GFR, Estimated: >60 mL/min (ref >60)
GFR, Estimated: >60 mL/min (ref >60)
Glucose, Bld: 144 mg/dL — ABNORMAL HIGH (ref 70–99)
Glucose, Bld: 122 mg/dL — ABNORMAL HIGH (ref 70–99)
Potassium: 3.2 mmol/L — ABNORMAL LOW (ref 3.5–5.1)
Potassium: 3.1 mmol/L — ABNORMAL LOW (ref 3.5–5.1)
Sodium: 152 mmol/L — ABNORMAL HIGH (ref 135–145)
Sodium: 152 mmol/L — ABNORMAL HIGH (ref 135–145)

## 2023-03-03 LAB — CBC
HCT: 35.8 % — ABNORMAL LOW (ref 36.0–46.0)
Hemoglobin: 11.8 g/dL — ABNORMAL LOW (ref 12.0–15.0)
MCH: 31.1 pg (ref 26.0–34.0)
MCHC: 33 g/dL (ref 30.0–36.0)
MCV: 94.2 fL (ref 80.0–100.0)
Platelets: 136 10*3/uL — ABNORMAL LOW (ref 150–400)
RBC: 3.8 MIL/uL — ABNORMAL LOW (ref 3.87–5.11)
RDW: 13.4 % (ref 11.5–15.5)
WBC: 7.1 10*3/uL (ref 4.0–10.5)
nRBC: 0 % (ref 0.0–0.2)

## 2023-03-03 LAB — GLUCOSE, CAPILLARY
Glucose-Capillary: 129 mg/dL — ABNORMAL HIGH (ref 70–99)
Glucose-Capillary: 139 mg/dL — ABNORMAL HIGH (ref 70–99)
Glucose-Capillary: 91 mg/dL (ref 70–99)
Glucose-Capillary: 92 mg/dL (ref 70–99)
Glucose-Capillary: 106 mg/dL — ABNORMAL HIGH (ref 70–99)
Glucose-Capillary: 107 mg/dL — ABNORMAL HIGH (ref 70–99)

## 2023-03-03 MED ORDER — POTASSIUM CHLORIDE 10 MEQ/100ML IV SOLN
10.0000 meq | INTRAVENOUS | Status: DC
  Filled 2023-03-03 (×2): qty 100

## 2023-03-03 MED ORDER — HYDRALAZINE HCL 20 MG/ML IJ SOLN
10.0000 mg | Freq: Four times a day (QID) | INTRAMUSCULAR | Status: DC | PRN
  Administered 2023-03-03 (×2): 10 mg via INTRAVENOUS
  Filled 2023-03-03 (×3): qty 1

## 2023-03-03 MED ORDER — POTASSIUM CHLORIDE 10 MEQ/100ML IV SOLN
10.0000 meq | INTRAVENOUS | Status: AC
Start: 2023-03-03 — End: 2023-03-03
  Administered 2023-03-03 (×3): 10 meq via INTRAVENOUS
  Filled 2023-03-03 (×3): qty 100

## 2023-03-03 NOTE — TOC Initial Note (Addendum)
Transition of Care [facility]) - Initial/Assessment Note    Patient Details  Name: Morgan Dixon MRN: [id number] Date of Birth: 12/02/1950  [facility] CM/SW Contact:    Colette Ribas, LCSWA Phone Number: 03/03/2023, 11:01 AM  Clinical Narrative:                  CSW received IM from NP-Taylor. Family is wanting to pursue hospice-likely to be a LTC facility with hospice, CSW reached out to the daughter to offer choices. Left voicemail.   1106AM: Spoke with daugther, she would like to speak with younger brother around hospice services & LTC and they will reconven tomorrow. 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 (HCC) 02/28/2023   Lower limb ulcer, calf, left, limited to breakdown of skin (HCC) 11/27/2022   PAD (peripheral artery disease) (HCC) 08/28/2022   Lymphedema 08/28/2022   Status post laparoscopic hernia repair 01/30/2022   Obesity 08/27/2020   Benign essential HTN 08/27/2020   CVA (cerebral vascular accident) (HCC) 08/24/2020   Acute metabolic encephalopathy 08/18/2020   Acute lower UTI 08/17/2020   Hypokalemia 08/17/2020   Left renal mass 08/17/2020   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 [place] [address] Phone: [phone] Fax: [phone]     Social Determinants of Health (SDOH) Social History: SDOH Screenings   Food Insecurity: No Food Insecurity (12/29/2021)  Housing: Low Risk  (12/29/2021)  Transportation Needs: No Transportation Needs (12/29/2021)  Utilities: Not At Risk (12/29/2021)  Depression (PHQ2-9): Low Risk  (11/15/2020)  Tobacco Use: Low Risk  (02/28/2023)   SDOH Interventions:     Readmission Risk Interventions    08/19/2020    2:01 PM  Readmission Risk Prevention Plan  Medication Screening Complete  Transportation Screening Complete

## 2023-03-03 NOTE — Progress Notes (Signed)
PHARMACY CONSULT NOTE  Pharmacy Consult for Electrolyte Monitoring and Replacement   Recent Labs: Potassium (mmol/L)  Date Value  03/03/2023 3.2 (L)  12/11/2012 4.0   Magnesium (mg/dL)  Date Value  78/29/5621 2.5 (H)   Calcium (mg/dL)  Date Value  30/86/5784 9.2   Calcium, Total (mg/dL)  Date Value  69/62/9528 9.3   Albumin (g/dL)  Date Value  41/32/4401 3.7   Phosphorus (mg/dL)  Date Value  02/72/5366 2.8   Sodium (mmol/L)  Date Value  03/03/2023 152 (H)  12/11/2012 136     Assessment: 72 y.o. female presenting with stroke and cerebral edema. PMH significant for chronic pain s/p spinal cord stimulator and cryptogenic embolic stroke in 2022.   Goal of Therapy:  Electrolytes WNL  Plan:  ---10 mEq IV KCl x 3 ---recheck electrolytes in am  Lowella Bandy ,PharmD Clinical Pharmacist 03/03/2023 9:09 AM

## 2023-03-03 NOTE — Progress Notes (Signed)
NEUROLOGY CONSULT FOLLOW UP NOTE   Date of service: March 03, 2023 Patient Name: Morgan Dixon MRN:  [id number] DOB:  1950-08-13  Brief HPI  Morgan Dixon is a 72 y.o. 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 Brought to the ED by EMS when found by staff today.    Interval Hx/subjective  -Sodium now stable 150-155 range -Code status has been updated to DNR/DNI in discussions with palliative and CCM, appreciate their assistance  -Continues to be intermittently brady to the 30s -Reporting pain in her right hip, restless  Vitals   Current vital signs: BP (!) 192/58   Pulse (!) 39   Temp 98.4 F (36.9 C) (Oral)   Resp (!) 24   Ht 5\' 6"  (1.676 m)   Wt 109 kg   SpO2 100%   BMI 38.79 kg/m  Vital signs in last 24 hours: Temp:  [97.8 F (36.6 C)-98.6 F (37 C)] 98.4 F (36.9 C) (11/17 0800) Pulse Rate:  [38-58] 39 (11/17 1019) Resp:  [17-27] 24 (11/17 1019) BP: (171-213)/(54-134) 192/58 (11/17 1019) SpO2:  [98 %-100 %] 100 % (11/17 1019) Weight:  [109 kg] 109 kg (11/17 0402)   Body mass index is 38.79 kg/m.  Physical Exam   Constitutional: Appears ill  Psych: Restless Eyes: No scleral injection.  HENT: No OP obstruction.  Head is turned towards the right side but can be repositioned (quickly turns head back towards the right) Head: Normocephalic.  Cardiovascular: Normal rate and regular rhythm.  Respiratory: Effort normal, non-labored breathing.  GI: Soft.  No distension. There is no tenderness.   Neurologic Examination   Neuro:  Mental  Status: Awake, alert, follows some simple commands intermittently.  Gestures to her right hip when asked where she is in pain after she responds "yes" to pain.  Overall paucity of speech/participation Cranial Nerves: II: Visual Fields are notable for a left hemianopia.  III,IV, VI: Marked right gaze deviation.  With VOR can move eyes slightly past midline but they quickly flipped to midline and then back to right gaze V: Facial sensation is slightly reduced on the left to eyelash brush compared to the right VII: Facial movement is notable for a marked left facial droop.  VIII: hearing is intact to voice Motor: Extensor posturing in the left upper extremity, 1/5 movement in the left lower extremity (does appear to withdraw).  Spontaneously moving right upper and lower extremity Sensory: Reactive to stimuli in all 4 extremities, more briskly on the right than the left   Labs and Diagnostic Imaging    Basic Metabolic Panel: Recent Labs  Lab 02/28/23 1950 02/28/23 2308 03/01/23 0440 03/01/23 0932 03/01/23 2102 03/02/23 0316 03/02/23 0952 03/02/23 1807 03/03/23 0838  NA 143   < > 150*   < > 153* 154* 155* 155* 152*  K 3.6  --  3.5  --   --  3.3*  --  3.4* 3.2*  CL 110  --  121*  --   --  126*  --  125* 121*  CO2 23  --  19*  --   --  21*  --  22 21*  GLUCOSE 128*  --  178*  --   --  165*  --  144* 144*  BUN 22  --  23  --   --  32*  --  32* 31*  CREATININE 0.90  --  0.71  --   --  0.83  --  0.86 0.78  CALCIUM 9.3  --  9.1  --   --  9.3  --  9.3 9.2  MG  --   --  2.3  --   --  2.5*  --   --   --   PHOS  --   --  2.8  --   --  2.8  --   --   --    < > = values in this interval not displayed.    CBC: Recent Labs  Lab 02/28/23 1518 03/01/23 0440 03/02/23 0316 03/03/23 0421  WBC 10.1 4.9 5.7 7.1  NEUTROABS 8.8*  --   --   --   HGB 13.1 11.5* 11.1* 11.8*  HCT 39.5 33.9* 33.4* 35.8*  MCV 94.0 90.9 94.6 94.2  PLT 184 150 122* 136*    Coagulation Studies: Recent Labs     02/28/23 1518  LABPROT 15.4*  INR 1.2      Lipid Panel:  Lab Results  Component Value Date   LDLCALC 32 03/01/2023   HgbA1c:  Lab Results  Component Value Date   HGBA1C 5.3 02/28/2023   Urine Drug Screen:     Component Value Date/Time   LABOPIA NONE DETECTED 08/17/2020 1615   COCAINSCRNUR NONE DETECTED 08/17/2020 1615   COCAINSCRNUR NONE DETECTED 05/24/2020 1959   LABBENZ NONE DETECTED 08/17/2020 1615   AMPHETMU NONE DETECTED 08/17/2020 1615   THCU NONE DETECTED 08/17/2020 1615   LABBARB NONE DETECTED 08/17/2020 1615    Alcohol Level     Component Value Date/Time   ETH <10 02/28/2023 1518   INR  Lab Results  Component Value Date   INR 1.2 02/28/2023   APTT  Lab Results  Component Value Date   APTT 26 02/28/2023   AED levels: No results found for: "PHENYTOIN", "ZONISAMIDE", "LAMOTRIGINE", "LEVETIRACETA"  CT Head without contrast(Personally reviewed) 11/15 8 a.m.: 1. Continued evolution of a likely subacute large right MCA territory infarct. No significant change in associated edema or minimal leftward midline shift. No acute hemorrhage. 2. Unchanged hyperdense right MCA.  CT angio Head and Neck with contrast(Personally reviewed): 1. Findings concerning for thrombus in the right cavernous and supraclinoid ICA, which may extend into the right M1, with minimal opacification of the right MCA and its branches. 2. The right A1 is not opacified but more distal ACAs receive flow from the anterior communicating artery. 3. No hemodynamically significant stenosis in the neck.  ECHO  1. Left ventricular ejection fraction, by estimation, is 55 to 60%. The  left ventricle has normal function. The left ventricle has no regional  wall motion abnormalities. There is mild left ventricular hypertrophy.  Left ventricular diastolic parameters  are indeterminate.   2. Right ventricular systolic function is normal. The right ventricular  size is mildly enlarged. There is  moderately elevated pulmonary artery  systolic pressure. The estimated right ventricular systolic pressure is  52.6 mmHg.   3. Left atrial size was moderately dilated.   4. The mitral valve is normal in structure. Trivial mitral valve  regurgitation.   5. The aortic valve is tricuspid. Aortic valve regurgitation is not  visualized.   6. Agitated  saline contrast bubble study was negative, with no evidence  of any interatrial shunt.  ... Left Atrium: Left atrial size was moderately dilated.  Right Atrium: Right atrial size was normal in size.   Impression   Morgan Dixon is a 72 y.o. female presenting with a large completed right M1 stroke, with cerebral edema and mild midline shift present on admission.  Peak swelling typically 3 to 5 days post initial event and she came in likely 12 to 20 hours from initial event given last known well and head CT findings.  Therefore highest risk for worsening symptoms will be from 11/16 evening through 11/19 morning.    Regarding stroke etiology, with left atrial enlargement there is certainly the possibility of occult atrial fibrillation; loop recorder interrogation pending at this time to confirm.  If loop recorder interrogation is negative would appear to be a cryptogenic stroke and would consider hypercoagulability workup etc. if within goals of care  Recommendations  # Right MCA embolic stroke # Cerebral edema with midline shift - MRI brain cannot be obtained due to spinal cord stimulator - Frequent neuro checks - ASA 300 mg PR daily - Loop recorder interrogation pending, if negative will consider further workup such as hypercoag panel for cryptogenic stroke - Risk factor modification - Telemetry monitoring; continues to have intermittent bradycardia to the 30s - Blood pressure goal gradually start normalizing per standard hypertensive urgency/emergency protocols - PT consult, OT consult, Speech consult - Goal sodium 150-155 at this time, at  goal at 152 this morning without any current intervention, will not initiate any IV fluids at this time - Repeat Head CT for any significant change in neuro examination, noting that patient does have some times waxing and waning participation in examination consistent with delirium - Appreciate Dr. Allena Katz following and managing comorbidities - Neurology will follow along closely; discussed with pharmacy and primary team via secure chat and in person   # Goals of care - On admission: long discussion with daughter at bedside, including review of images. High risk of further edema; possible risk of death due to stroke size. Discussed at best would be total care due to left sided weakness, but still able to have meaningful conversations, with some difficulty with prosody. Discussed likely to need feeding tube for dysphagia given size of stroke. Family still processing but for now, full scope of care; they know she would not want long term ventilation  - Today discussed with daughter at bedside the patient did fail swallow evaluation yesterday with SLP and is likely not to be able to swallow safely due to the size of her stroke.  We discussed that options would include either feeding tube or transition to hospice, appreciate NP Ladona Ridgel at bedside leading discussion on what transition to hospice might look like.  Re-confirmed patient is DNR/DNI CODE STATUS (which was updated on 11/15 in discussion with CCM and palliative care teams) ______________________________________________________________________   Thank you for the opportunity to take part in the care of this patient. If you have any further questions, please contact the neurology consultation team on call. Updated oncall schedule is listed on AMION.  Signed,  Brooke Dare MD-PhD [facility] [phone]  CRITICAL CARE Performed by: Gordy Councilman   Total critical care time: 40 minutes  Critical care time was exclusive of  separately billable procedures and treating other patients.  Critical care was necessary to treat or prevent imminent or life-threatening deterioration.  Critical care was time spent personally by  me on the following activities: development of treatment plan with patient and/or surrogate as well as nursing, discussions with consultants, evaluation of patient's response to treatment, examination of patient, obtaining history from patient or surrogate, ordering and performing treatments and interventions, ordering and review of laboratory studies, ordering and review of radiographic studies, pulse oximetry and re-evaluation of patient's condition.

## 2023-03-03 NOTE — Progress Notes (Signed)
[facility]  - [place] [facility]   PATIENT NAME: Stephaniemarie Felten    MR#:  [id number]  DATE OF BIRTH:  11/05/1950  SUBJECTIVE:  daughter and patient's first cousin at bedside. Patient moving right upper nor lower extremity intermittently very well. She has significant hemiplegia on the left upper and lower extremity. Her right gaze. Unable to communicate much other than telling her name and recognizing daughter. Came in after she had experience a fall and found to have acute massive right MCA territory stroke with midline shift.    VITALS:  Blood pressure (!) 183/63, pulse (!) 43, temperature 99.9 F (37.7 C), temperature source Oral, resp. rate (!) 27, height 5\' 6"  (1.676 m), weight 109 kg, SpO2 99%.  PHYSICAL EXAMINATION:  Limited exam GENERAL:  72 y.o.-year-old patient with no acute distress.  LUNGS: Normal breath sounds bilaterally, no wheezing CARDIOVASCULAR: S1, S2 normal. No murmur   ABDOMEN: Soft, non tender EXTREMITIES: No  edema b/l.    NEUROLOGIC: left side dense Hemiplegia with right gaze  LABORATORY PANEL:  CBC Recent Labs  Lab 03/03/23 0421  WBC 7.1  HGB 11.8*  HCT 35.8*  PLT 136*    Chemistries  Recent Labs  Lab 02/28/23 1518 02/28/23 1536 03/02/23 0316 03/02/23 0952 03/03/23 0838  NA 140   < > 154*   < > 152*  K 3.9   < > 3.3*   < > 3.2*  CL 108   < > 126*   < > 121*  CO2 23   < > 21*   < > 21*  GLUCOSE 164*   < > 165*   < > 144*  BUN 22   < > 32*   < > 31*  CREATININE 0.93   < > 0.83   < > 0.78  CALCIUM 9.4   < > 9.3   < > 9.2  MG  --    < > 2.5*  --   --   AST 33  --   --   --   --   ALT 17  --   --   --   --   ALKPHOS 68  --   --   --   --   BILITOT 2.2*  --   --   --   --    < > = values in this interval not displayed.   Cardiac Enzymes No results for input(s): "TROPONINI" in the last 168 hours. RADIOLOGY:  CT HEAD WO CONTRAST ( )  Result Date: 03/02/2023 CLINICAL DATA:  Altered mental status with stroke.  Follow-up midline shift EXAM: CT HEAD WITHOUT CONTRAST TECHNIQUE: Contiguous axial images were obtained from the base of the skull through the vertex without intravenous contrast. RADIATION DOSE REDUCTION: This exam was performed according to the departmental dose-optimization program which includes automated exposure control, adjustment of the mA and/or kV according to patient size and/or use of iterative reconstruction technique. COMPARISON:  Left yesterday FINDINGS: Brain: Acute infarct involving the right MCA distribution with cytotoxic edema causing local mass effect and minimal midline shift. No hydrocephalus or entrapment. Moderate to large remote left MCA branch infarct centered at the parietal lobe. No acute hemorrhage, hydrocephalus, or masslike finding Vascular: Stable Skull: Normal. Negative for fracture or focal lesion. Sinuses/Orbits: Negative IMPRESSION: Stable swelling at the right MCA distribution infarct. Midline shift is minimal. Electronically Signed   By: Tiburcio Pea M.D.   On: 03/02/2023 09:26   ECHOCARDIOGRAM COMPLETE  Result Date:  03/01/2023    ECHOCARDIOGRAM REPORT   Patient Name:   ANALYA SALVADOR Date of Exam: 03/01/2023 Medical Rec #:  [id number]         Height:       66.0 in Accession #:    [id number]        Weight:       240.3 lb Date of Birth:  August 12, 1950         BSA:          2.162 m Patient Age:    72 years          BP:           187/65 mmHg Patient Gender: F                 HR:           49 bpm. Exam Location:  [facility] Procedure: 2D Echo, Cardiac Doppler, Color Doppler and Saline Contrast Bubble            Study Indications:     Stroke I63.9  History:         Patient has prior history of Echocardiogram examinations, most                  recent 08/21/2020. No cardiac history listed in chart.  Sonographer:     Cristela Blue Referring Phys:  [id number] SRISHTI L BHAGAT Diagnosing Phys: Mellody Drown Alluri IMPRESSIONS  1. Left ventricular ejection fraction, by estimation, is 55 to 60%. The  left ventricle has normal function. The left ventricle has no regional wall motion abnormalities. There is mild left ventricular hypertrophy. Left ventricular diastolic parameters are indeterminate.  2. Right ventricular systolic function is normal. The right ventricular size is mildly enlarged. There is moderately elevated pulmonary artery systolic pressure. The estimated right ventricular systolic pressure is 52.6 mmHg.  3. Left atrial size was moderately dilated.  4. The mitral valve is normal in structure. Trivial mitral valve regurgitation.  5. The aortic valve is tricuspid. Aortic valve regurgitation is not visualized.  6. Agitated saline contrast bubble study was negative, with no evidence of any interatrial shunt. FINDINGS  Left Ventricle: Left ventricular ejection fraction, by estimation, is 55 to 60%. The left ventricle has normal function. The left ventricle has no regional wall motion abnormalities. The left ventricular internal cavity size was normal in size. There is  mild left ventricular hypertrophy. Left ventricular diastolic parameters are indeterminate. Right Ventricle: The right ventricular size is mildly enlarged. No increase in right ventricular wall thickness. Right ventricular systolic function is normal. There is moderately elevated pulmonary artery systolic pressure. The tricuspid regurgitant velocity is 3.45 m/s, and with an assumed right atrial pressure of 5 mmHg, the estimated right ventricular systolic pressure is 52.6 mmHg. Left Atrium: Left atrial size was moderately dilated. Right Atrium: Right atrial size was normal in size. Pericardium: There is no evidence of pericardial effusion. Mitral Valve: The mitral valve is normal in structure. Mild mitral annular calcification. Trivial mitral valve regurgitation. Tricuspid Valve: The tricuspid valve is normal in structure. Tricuspid valve regurgitation is mild. Aortic Valve: The aortic valve is tricuspid. Aortic valve regurgitation is not  visualized. Aortic valve mean gradient measures 5.0 mmHg. Aortic valve peak gradient measures 11.0 mmHg. Aortic valve area, by VTI measures 2.38 cm. Pulmonic Valve: The pulmonic valve was not well visualized. Pulmonic valve regurgitation is not visualized. Aorta: The aortic root is normal in size and structure. Venous: The inferior vena cava was not  well visualized. IAS/Shunts: No atrial level shunt detected by color flow Doppler. Agitated saline contrast was given intravenously to evaluate for intracardiac shunting. Agitated saline contrast bubble study was negative, with no evidence of any interatrial shunt.  LEFT VENTRICLE PLAX 2D LVIDd:         4.00 cm   Diastology LVIDs:         2.95 cm   LV e' medial:    11.00 cm/s LV PW:         1.10 cm   LV E/e' medial:  12.7 LV IVS:        1.10 cm   LV e' lateral:   9.46 cm/s LVOT diam:     2.00 cm   LV E/e' lateral: 14.8 LV SV:         96 LV SV Index:   45 LVOT Area:     3.14 cm  RIGHT VENTRICLE RV Basal diam:  4.90 cm RV Mid diam:    4.50 cm LEFT ATRIUM              Index        RIGHT ATRIUM           Index LA diam:        4.10 cm  1.90 cm/m   RA Area:     16.50 cm LA Vol (A2C):   114.0 ml 52.73 ml/m  RA Volume:   40.90 ml  18.92 ml/m LA Vol (A4C):   64.7 ml  29.92 ml/m LA Biplane Vol: 92.0 ml  42.55 ml/m  AORTIC VALVE AV Area (Vmax):    2.21 cm AV Area (Vmean):   2.04 cm AV Area (VTI):     2.38 cm AV Vmax:           166.00 cm/s AV Vmean:          106.000 cm/s AV VTI:            0.405 m AV Peak Grad:      11.0 mmHg AV Mean Grad:      5.0 mmHg LVOT Vmax:         117.00 cm/s LVOT Vmean:        68.800 cm/s LVOT VTI:          0.307 m LVOT/AV VTI ratio: 0.76  AORTA Ao Root diam: 2.80 cm MITRAL VALVE                TRICUSPID VALVE MV Area (PHT): 3.99 cm     TR Peak grad:   47.6 mmHg MV Decel Time: 190 msec     TR Vmax:        345.00 cm/s MV E velocity: 140.00 cm/s MV A velocity: 61.30 cm/s   SHUNTS MV E/A ratio:  2.28         Systemic VTI:  0.31 m                              Systemic Diam: 2.00 cm Windell Norfolk Electronically signed by Windell Norfolk Signature Date/Time: 03/01/2023/5:30:33 PM    Final     Assessment and Plan  72 yo female with a PMH significant for prior left MCA territory cryptogenic embolic stroke with no deficits, loop recorder in place, chronic bradycardia (typical heart rate in the 50's), spinal cord stimulator in place for chronic pain, hypertension, hyperlipidemia, and obesity s/p gastrectomy.  She presented to [facility] ER via EMS from  [facility] originally EMS notified for a fall.  However, when EMS arrived on the scene pt noted to have altered mental status, slurred speech, and left sided neglect with right sided deviation.  Her last known well time was on 11/13 at 1700.   CT head on admission extensive hypodensity in the right MCA territory, likely cytotoxic edema from acute infarct, with 4 mm right-to-left midline shift. Possible petechial hemorrhage. No evidence of right hemorrhagic transformation. ASPECTS is 0. 2. Hyperdense right MCA, as well as a possible hyperdense right ICA, concerning for thrombus.  CT angiogram head and neck 1. Findings concerning for thrombus in the right cavernous and supraclinoid ICA, which may extend into the right M1, with minimal opacification of the right MCA and its branches. 2. The right A1 is not opacified but more distal ACAs receive flow from the anterior communicating artery. 3. No hemodynamically significant stenosis in the neck.  Acute encephalopathy secondary to cytotoxic edema from acute right MCA infarct with right to left midline shift Thrombus in the right ICA with extension into M1 history of left MCA territory crypto genic embolic stroke -- patient seen by neurology. Received 3% hypertonic saline secondary to edema. Hypertonic saline on hold given elevated sodium. -- Keeping sodium level between 150- 155 -- patient has left-sided hemiplegia with right gaze., Able  to communicate minimal. --High risk for aspiration continue NPO status seen by speech therapy -- Per rectal aspirin -- discussion with family they do not want feeding tube.  Chronic bradycardia history of loop recorder placement couple years ago -- patient used to follow-up [facility] cardiology --will try to get LOOP recorder interrogated depending on GOC  Palliative Care NP input appreciated Overall poor prognosis given extent of Stroke Awaiting other family member to arrive Option for Full comfort care also was discussed with dter at bedside  Procedures: Family communication :Dter Dorann Lodge Consults :Neurology CODE STATUS: DNR DNI DVT Prophylaxis :SCD Level of care: Stepdown Status is: Inpatient Remains inpatient appropriate because: Massive CVA    TOTAL TIME TAKING CARE OF THIS PATIENT: 40 minutes.  >50% time spent on counselling and coordination of care  Note: This dictation was prepared with Dragon dictation along with smaller phrase technology. Any transcriptional errors that result from this process are unintentional.  Enedina Finner M.D    [facility]   CC: Primary care physician; Mick Sell, MD

## 2023-03-03 NOTE — Progress Notes (Signed)
Daily Progress Note   Patient Name: Morgan Dixon       Date: 03/03/2023 DOB: 06/02/1950  Age: 72 y.o. MRN#: [id number] Attending Physician: Enedina Finner, MD Primary Care Physician: Mick Sell, MD Admit Date: 02/28/2023  Reason for Consultation/Follow-up: Establishing goals of care  HPI/Brief Hospital Review: 72 y.o. 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 HLD and obesity s/p gastrectomy admitted from [facility] on 02/28/2023 s/p fall at facility but on EMS arrival she was found to have AMS, slurred speech and left sided neglect.    On arrival to ED, Code Stroke activated, CT head revealed extensive hypodensity in the right MCA territory, likely cytotoxic edema with 4 mm right-to-left midline shift and possible thrombus to right ICA. CT head/neck confirmed thrombus in right ICA with extension to M1.   While in ED, developed worsening lethargy and found to have worsening midline shift on repeat imaging-admitted to ICU for close neurological monitoring-placed on 3% saline which has now been discontinued   Palliative medicine was consulted for assisting with goals of care conversations. Noted initial GOC conversation had with neuro team, Dr. Iver Nestle and family, per notes risk of further edema and mortality discussed as well as potential long term effects and likely need for feeding tube. No decisions were made at that time as per note family requested time for processing information.  Subjective: Extensive chart review has been completed prior to meeting patient including labs, vital signs, imaging, progress notes, orders, and available advanced  directive documents from current and previous encounters.    Visited collaboratively with Dr. Iver Nestle and Dr. Allena Katz with daughter-Juanita at bedside. Dr. Iver Nestle reviewed most recent updates, again discussed her prognosis and the likelihood of [name] being bed bound and requiring artificial nutrition. Daughter shares her mother would not want to live with this quality of life and would not be accepting of artificial nutrition through PEG placement. Dr. Allena Katz and I discussed transitioning to full comfort care with Juanita.  We discussed [name] would no longer receive aggressive medical interventions such as continuous vital signs, lab work, radiology testing, or medications not focused on comfort. All care would focus on how the patient is looking and  feeling. This would include management of any symptoms that may cause discomfort, pain, shortness of breath, cough, nausea, agitation, anxiety, and/or secretions etc. Symptoms would be managed with medications and other non-pharmacological interventions such as spiritual support if requested, repositioning, music therapy, or therapeutic listening. Family verbalized understanding and appreciation. Juanita voices understanding and would like to transition to full comfort measures tomorrow. Multiple family members from out of town are scheduled to visit bedside today.  Will have provider from PMT follow up with daughter tomorrow and assist with transition to comfort care. TOC and [facility] hospice liaison have also been engaged and plan to visit with daughter tomorrow.  Care plan was discussed with primary team and nursing staff.  Thank you for allowing the Palliative Medicine Team to assist in the care of this patient.  Total time:  35 minutes  Time spent includes: Detailed review of medical records (labs, imaging, vital signs), medically appropriate exam (mental status, respiratory, cardiac, skin), discussed with treatment team, counseling and educating  patient, family and staff, documenting clinical information, medication management and coordination of care.  Leeanne Deed, DNP, AGNP-C Palliative Medicine   Please contact Palliative Medicine Team phone at [phone] for questions and concerns.

## 2023-03-04 DIAGNOSIS — Z515 Encounter for palliative care: Secondary | ICD-10-CM | POA: Diagnosis not present

## 2023-03-04 DIAGNOSIS — I63231 Cerebral infarction due to unspecified occlusion or stenosis of right carotid arteries: Secondary | ICD-10-CM | POA: Diagnosis not present

## 2023-03-04 DIAGNOSIS — I639 Cerebral infarction, unspecified: Secondary | ICD-10-CM | POA: Diagnosis not present

## 2023-03-04 LAB — GLUCOSE, CAPILLARY
Glucose-Capillary: 122 mg/dL — ABNORMAL HIGH (ref 70–99)
Glucose-Capillary: 125 mg/dL — ABNORMAL HIGH (ref 70–99)
Glucose-Capillary: 127 mg/dL — ABNORMAL HIGH (ref 70–99)

## 2023-03-04 LAB — BASIC METABOLIC PANEL
Anion gap: 13 (ref 5–15)
BUN: 31 mg/dL — ABNORMAL HIGH (ref 8–23)
CO2: 21 mmol/L — ABNORMAL LOW (ref 22–32)
Calcium: 9.4 mg/dL (ref 8.9–10.3)
Chloride: 119 mmol/L — ABNORMAL HIGH (ref 98–111)
Creatinine, Ser: 0.76 mg/dL (ref 0.44–1.00)
GFR, Estimated: >60 mL/min (ref >60)
Glucose, Bld: 138 mg/dL — ABNORMAL HIGH (ref 70–99)
Potassium: 3.2 mmol/L — ABNORMAL LOW (ref 3.5–5.1)
Sodium: 153 mmol/L — ABNORMAL HIGH (ref 135–145)

## 2023-03-04 LAB — MAGNESIUM: Magnesium: 2.5 mg/dL — ABNORMAL HIGH (ref 1.7–2.4)

## 2023-03-04 MED ORDER — ACETAMINOPHEN 325 MG PO TABS
650.0000 mg | ORAL_TABLET | Freq: Four times a day (QID) | ORAL | Status: DC | PRN
Start: 2023-03-04 — End: 2023-03-05

## 2023-03-04 MED ORDER — GLYCOPYRROLATE 0.2 MG/ML IJ SOLN: 0.2000 mg | INTRAMUSCULAR | Status: DC | PRN

## 2023-03-04 MED ORDER — LORAZEPAM 2 MG/ML IJ SOLN
2.0000 mg | INTRAMUSCULAR | Status: DC | PRN
  Administered 2023-03-05 – 2023-03-06 (×2): 2 mg via INTRAVENOUS
  Administered 2023-03-06: 4 mg via INTRAVENOUS
  Administered 2023-03-07: 2 mg via INTRAVENOUS
  Administered 2023-03-07: 4 mg via INTRAVENOUS
  Administered 2023-03-07: 2 mg via INTRAVENOUS
  Filled 2023-03-04 (×3): qty 1
  Filled 2023-03-04 (×2): qty 2
  Filled 2023-03-04: qty 1

## 2023-03-04 MED ORDER — POLYVINYL ALCOHOL 1.4 % OP SOLN: 1.0000 [drp] | Freq: Four times a day (QID) | OPHTHALMIC | Status: DC | PRN

## 2023-03-04 MED ORDER — MORPHINE SULFATE (PF) 2 MG/ML IV SOLN
1.0000 mg | INTRAVENOUS | Status: DC | PRN
  Administered 2023-03-04: 2 mg via INTRAVENOUS
  Administered 2023-03-04: 1 mg via INTRAVENOUS
  Administered 2023-03-06 – 2023-03-07 (×4): 2 mg via INTRAVENOUS
  Filled 2023-03-04 (×6): qty 1

## 2023-03-04 MED ORDER — ACETAMINOPHEN 650 MG RE SUPP
650.0000 mg | Freq: Four times a day (QID) | RECTAL | Status: DC | PRN
Start: 2023-03-04 — End: 2023-03-05

## 2023-03-04 MED ORDER — GLYCOPYRROLATE 1 MG PO TABS: 1.0000 mg | ORAL_TABLET | ORAL | Status: DC | PRN

## 2023-03-04 NOTE — Progress Notes (Signed)
[facility]  - [place] [facility]   PATIENT NAME: Morgan Dixon    MR#:  [id number]  DATE OF BIRTH:  1950/11/17  SUBJECTIVE:  daughter and son at bedside. Patient moving right upper nor lower extremity intermittently very well. She has significant hemiplegia on the left upper and lower extremity. Her right gaze. Unable to communicate much other than few basic questions Came in after she had experience a fall and found to have acute massive right MCA territory stroke with midline shift.    VITALS:  Blood pressure (!) 164/52, pulse (!) 47, temperature 99.6 F (37.6 C), temperature source Axillary, resp. rate (!) 27, height 5\' 6"  (1.676 m), weight 109 kg, SpO2 100%.  PHYSICAL EXAMINATION:  Limited exam GENERAL:  72 y.o.-year-old patient with no acute distress.  LUNGS: Normal breath sounds bilaterally, no wheezing CARDIOVASCULAR: S1, S2 normal. No murmur   ABDOMEN: Soft, non tender EXTREMITIES: No  edema b/l.    NEUROLOGIC: left side dense Hemiplegia with right gaze  LABORATORY PANEL:  CBC Recent Labs  Lab 03/03/23 0421  WBC 7.1  HGB 11.8*  HCT 35.8*  PLT 136*    Chemistries  Recent Labs  Lab 02/28/23 1518 02/28/23 1536 03/04/23 0335 03/04/23 0804  NA 140   < >  --  153*  K 3.9   < >  --  3.2*  CL 108   < >  --  119*  CO2 23   < >  --  21*  GLUCOSE 164*   < >  --  138*  BUN 22   < >  --  31*  CREATININE 0.93   < >  --  0.76  CALCIUM 9.4   < >  --  9.4  MG  --    < > 2.5*  --   AST 33  --   --   --   ALT 17  --   --   --   ALKPHOS 68  --   --   --   BILITOT 2.2*  --   --   --    < > = values in this interval not displayed.    RADIOLOGY:  No results found.  Assessment and Plan  72 yo female with a PMH significant for prior left MCA territory cryptogenic embolic stroke with no deficits, loop recorder in place, chronic bradycardia (typical heart rate in the 50's), spinal cord stimulator in place for chronic pain, hypertension,  hyperlipidemia, and obesity s/p gastrectomy.  She presented to [facility] ER via EMS from [facility] originally EMS notified for a fall.  However, when EMS arrived on the scene pt noted to have altered mental status, slurred speech, and left sided neglect with right sided deviation.  Her last known well time was on 11/13 at 1700.   CT head on admission extensive hypodensity in the right MCA territory, likely cytotoxic edema from acute infarct, with 4 mm right-to-left midline shift. Possible petechial hemorrhage. No evidence of right hemorrhagic transformation. ASPECTS is 0. 2. Hyperdense right MCA, as well as a possible hyperdense right ICA, concerning for thrombus.  CT angiogram head and neck 1. Findings concerning for thrombus in the right cavernous and supraclinoid ICA, which may extend into the right M1, with minimal opacification of the right MCA and its branches. 2. The right A1 is not opacified but more distal ACAs receive flow from the anterior communicating artery. 3. No hemodynamically significant stenosis in the neck.  Acute encephalopathy secondary to cytotoxic edema from acute right MCA infarct with right to left midline shift Thrombus in the right ICA with extension into M1 history of left MCA territory crypto genic embolic stroke -- patient seen by neurology. Received 3% hypertonic saline secondary to edema. Hypertonic saline on hold given elevated sodium. -- Patient now transition to full comfort care  Chronic bradycardia history of loop recorder placement couple years ago -- patient used to follow-up [facility] cardiology  Palliative Care NP input appreciated Overall poor prognosis given extent of Stroke  Cost with patient's daughter Morgan Dixon and son at bedside. There are in agreement with moving forward with full comfort care. Will please comfort care orders. [facility] hospice has been notified by TOC.    Family communication :Dter Morgan Dixon and pt's  son Consults :Neurology CODE STATUS: DNR DNI DVT Prophylaxis :SCD Level of care: Med-Surg Status is: Inpatient Remains inpatient appropriate because: Massive CVA  TOC for discharge planning to long-term care with hospice overall poor prognosis  TOTAL TIME TAKING CARE OF THIS PATIENT: 35 minutes.  >50% time spent on counselling and coordination of care  Note: This dictation was prepared with Dragon dictation along with smaller phrase technology. Any transcriptional errors that result from this process are unintentional.  Enedina Finner M.D    [facility]   CC: Primary care physician; Mick Sell, MD

## 2023-03-04 NOTE — Progress Notes (Signed)
SLP Cancellation Note  Patient Details Name: Morgan Dixon MRN: [id number] DOB: 1950/06/10   Cancelled treatment:       Reason Eval/Treat Not Completed:  (chart reviewed.  This pt transitioned to comfort care pre chart notes.  ST order was cancelled by MD.)      Jerilynn Som, MS, CCC-SLP Speech Language Pathologist Rehab Services; [facility] [phone] (ascom) Nahjae Hoeg 03/04/2023, 1:02 PM

## 2023-03-04 NOTE — Progress Notes (Signed)
PHARMACY CONSULT NOTE  Pharmacy Consult for Electrolyte Monitoring and Replacement   Recent Labs: Potassium (mmol/L)  Date Value  03/03/2023 3.1 (L)  12/11/2012 4.0   Magnesium (mg/dL)  Date Value  16/01/9603 2.5 (H)   Calcium (mg/dL)  Date Value  54/12/8117 9.2   Calcium, Total (mg/dL)  Date Value  14/78/2956 9.3   Albumin (g/dL)  Date Value  21/30/8657 3.7   Phosphorus (mg/dL)  Date Value  84/69/6295 2.8   Sodium (mmol/L)  Date Value  03/03/2023 152 (H)  12/11/2012 136    Assessment: 72 y.o. female presenting with stroke and cerebral edema. PMH significant for chronic pain s/p spinal cord stimulator and cryptogenic embolic stroke in 2022.   Goal of Therapy:  Electrolytes WNL  Plan:  --In review of chart, appears plan is to transition to comfort care today --Will discontinue electrolyte consult at this time. Defer further ordering of labs --Pharmacy will continue to monitor peripherally  Tressie Ellis 03/04/2023 7:39 AM

## 2023-03-04 NOTE — Progress Notes (Signed)
[facility] Liaison Note  Follow up on new referral for hospice services at LTC post hospital discharge.  Spoke with patient's daughter and son earlier this morning, who were planning on being at the hospital.  We discussed meeting after lunch.  Currently there is no family present and no one in the waiting room.  Asked CCU staff and they stated family was here earlier.  I left a message on Juanita's (daughter) cell phone to please call me back.  [facility] folder left at the bedside for the family.  Please call with any hospice related questions or concerns.  Norris Cross, RN Nurse Liaison [phone]

## 2023-03-04 NOTE — Progress Notes (Signed)
[facility] LIAISON NOTE   Received request from Joannie Springs, Transitions of Care Manager, for hospice services at LTC after discharge from [facility].  Vernona Rieger states family would like to speak with [facility] Liaison today.   Spoke with Salome Spotted, daughter, to initiate education related to hospice philosophy, services, and team approach to care. Patient/family verbalized understanding of information given. During discussion on the phone, her brother arrived and he was updated and brought into the conversation of hospice.  They stated they would be at the hospital later today and we arranged to meet after lunch.  Per phone conversation, they will be looking at hospice services at LTC facility as patient will not be able to return to her prior living arrangements a [facility].  Darrian Genelle Gather, TOC and hospital team updated.  Hospital liaison team will follow through final disposition.  Please call with any hospice related questions or concerns.  Thank you for the opportunity to participate in this patient's care.  Norris Cross, RN Nurse Liaison [phone]

## 2023-03-04 NOTE — Plan of Care (Signed)
Per palliative care note, family is planning to transition to full comfort care today. Neurology will not continue to actively follow, but please re-engage if we can be of any further assistance.  Bing Neighbors, MD [facility] [phone]  If 7pm- 7am, please page neurology on call as listed in AMION.

## 2023-03-04 NOTE — Progress Notes (Signed)
PT Cancellation Note  Patient Details Name: Morgan Dixon MRN: [id number] DOB: 08-14-1950   Cancelled Treatment:    Reason Eval/Treat Not Completed: Other (comment). Per discussion with care team, pt is pending goals of care discussion when family arrives. Will hold session at this time until further plan decided. Of note, pt with poor overall prognosis.   Lashanta Elbe 03/04/2023, 11:32 AM Elizabeth Palau, PT, DPT, GCS [phone]

## 2023-03-04 NOTE — Progress Notes (Signed)
OT Cancellation Note  Patient Details Name: Morgan Dixon MRN: [id number] DOB: 05-23-1950   Cancelled Treatment:    Per chart review, pt transitioned to comfort care 11/18 per family request. OT signing off.   Butch Penny, SOT

## 2023-03-04 NOTE — Progress Notes (Signed)
Palliative Care Progress Note, Assessment & Plan   Patient Name: Morgan Dixon       Date: 03/04/2023 DOB: 1950/07/08  Age: 72 y.o. MRN#: [id number] Attending Physician: Enedina Finner, MD Primary Care Physician: Mick Sell, MD Admit Date: 02/28/2023  Subjective: Patient is lying in bed and leaning on her right side.  She is awake.  She acknowledges my presence and is able to make her wishes known.  Her sister-in-law Darl Pikes is at bedside during my visit.  She is endorses patient has been complaining of dry mouth for which patient nods her head and says yes.   HPI: 72 y.o. 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 HLD and obesity s/p gastrectomy admitted from [facility] ALF on 02/28/2023 s/p fall at facility but on EMS arrival she was found to have AMS, slurred speech and left sided neglect.    On arrival to ED, Code Stroke activated, CT head revealed extensive hypodensity in the right MCA territory, likely cytotoxic edema with 4 mm right-to-left midline shift and possible thrombus to right ICA. CT head/neck confirmed thrombus in right ICA with extension to M1.   While in ED, developed worsening lethargy and found to have worsening midline shift on repeat imaging-admitted to ICU for close neurological monitoring-placed on 3% saline which has now been discontinued   Palliative medicine was consulted for assisting with goals of care conversations. Noted initial GOC conversation had with neuro team, Dr. Iver Nestle and family, per notes risk of further edema and mortality discussed as well as potential long term effects and likely need for feeding tube. No decisions were  made at that time as per note family requested time for processing information.  Summary of counseling/coordination of care: Extensive chart review completed prior to meeting patient including labs, vital signs, imaging, progress notes, orders, and available advanced directive documents from current and previous encounters.   After reviewing the patient's chart and assessing the patient at bedside, I spoke with patient and sister-in-law Darl Pikes in regards to symptom management and plan of care.  Symptoms assessed.  Patient endorses that her mouth stays dry.  She has no other acute complaints at this time. Discussed aggressive oral care, including swabs and oral moisturizer.   I attempted to elicit goals and values and portioned the patient and family.  As per discussions with my colleague, family needed time to process.  Sister-in-law at bedside shares that family has processed and that they are leaning towards hospice.  Juanita, patient's daughter, plans to be at the hospital later today.   I counseled with attending Dr. Allena Katz.  Dr. Allena Katz spoke with patient's daughter and son at bedside.  Decision was made to shift to full comfort measures.  Attending placed orders.  TOC made aware of shift to comfort measures and following closely to assist with discharge planning with hospice services.   PMT remains available to patient and family throughout her hospitalization.  Physical Exam Vitals reviewed.  Constitutional:      General: She is not in acute distress.    Appearance: She is normal weight.  HENT:     Head: Normocephalic.     Mouth/Throat:     Mouth: Mucous membranes are dry.  Eyes:     Pupils: Pupils are equal, round, and reactive to light.  Abdominal:     Palpations: Abdomen is soft.  Musculoskeletal:     Comments: Generalized weakness  Skin:    General: Skin is warm and dry.  Neurological:     Mental Status: She is alert.  Psychiatric:        Mood and Affect: Mood normal.         Behavior: Behavior normal.             Total Time 25 minutes   Time spent includes: Detailed review of medical records (labs, imaging, vital signs), medically appropriate exam (mental status, respiratory, cardiac, skin), discussed with treatment team, counseling and educating patient, family and staff, documenting clinical information, medication management and coordination of care.  Samara Deist L. Bonita Quin, DNP, FNP-BC Palliative Medicine Team

## 2023-03-05 DIAGNOSIS — I63231 Cerebral infarction due to unspecified occlusion or stenosis of right carotid arteries: Secondary | ICD-10-CM | POA: Diagnosis not present

## 2023-03-05 DIAGNOSIS — Z515 Encounter for palliative care: Secondary | ICD-10-CM | POA: Diagnosis not present

## 2023-03-05 DIAGNOSIS — I639 Cerebral infarction, unspecified: Secondary | ICD-10-CM | POA: Diagnosis not present

## 2023-03-05 MED ORDER — ONDANSETRON HCL 4 MG/2ML IJ SOLN: 4.0000 mg | Freq: Four times a day (QID) | INTRAMUSCULAR | Status: DC | PRN

## 2023-03-05 MED ORDER — METHOCARBAMOL 1000 MG/10ML IJ SOLN
500.0000 mg | Freq: Four times a day (QID) | INTRAMUSCULAR | Status: DC
  Administered 2023-03-05 – 2023-03-07 (×10): 500 mg via INTRAVENOUS
  Filled 2023-03-05 (×11): qty 5

## 2023-03-05 MED ORDER — ONDANSETRON 4 MG PO TBDP: 4.0000 mg | ORAL_TABLET | Freq: Four times a day (QID) | ORAL | Status: DC | PRN

## 2023-03-05 MED ORDER — ACETAMINOPHEN 10 MG/ML IV SOLN
1000.0000 mg | Freq: Four times a day (QID) | INTRAVENOUS | Status: AC
  Administered 2023-03-05 – 2023-03-06 (×4): 1000 mg via INTRAVENOUS
  Filled 2023-03-05 (×4): qty 100

## 2023-03-05 NOTE — Progress Notes (Signed)
  [facility]- [employer]  Received a message from RN that patient's daughter wanted to speak with Hospital Liaison.  HL met with daughter at the bedside.  She had questions about the LTC placement process and cost of LTC. An overview of the process was shared with the daughter. She would like to talk with TOC more about the details of looking at LTC placement. She does not think that the patient would qualify for Medicaid. Hospice will follow at a LTC facility when one is found.   Above information shared with TOC, Jonetta Speak, RN, and medical care team at [facility].  We are happy to assess for IPU appropriateness if patient's medical condition changes.  Please don't hesitate to call with any Hospice related questions or concerns.     Thank you for the opportunity to participate in this patient's care. [facility] Liaison [phone]

## 2023-03-05 NOTE — Progress Notes (Signed)
Nutrition Brief Note  Chart reviewed. Pt now transitioning to comfort care.  No further nutrition interventions planned at this time.  Please re-consult as needed.   Levada Schilling, RD, LDN, CDCES Registered Dietitian III Certified Diabetes Care and Education Specialist Please refer to [facility] for RD and/or RD on-call/weekend/after hours pager

## 2023-03-05 NOTE — Care Management Important Message (Signed)
Important Message  Patient Details  Name: Morgan Dixon MRN: [id number] Date of Birth: 1951/01/04   Important Message Given:  Yes - Medicare IM     [name], Stephan Minister 03/05/2023, 10:35 AM

## 2023-03-05 NOTE — Progress Notes (Signed)
Palliative Care Progress Note, Assessment & Plan   Patient Name: Morgan Dixon       Date: 03/05/2023 DOB: 1950-05-29  Age: 72 y.o. MRN#: [id number] Attending Physician: Enedina Finner, MD Primary Care Physician: Mick Sell, MD Admit Date: 02/28/2023  Subjective: Patient is lying flat in bed with her daughter at bedside.  She acknowledges my presence.  She is able to answer simple questions and make her wishes known.  Distress noted.  Patient complains of headache and low back pain.  HPI: 72 y.o. 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 HLD and obesity s/p gastrectomy admitted from [facility]F on 02/28/2023 s/p fall at facility but on EMS arrival she was found to have AMS, slurred speech and left sided neglect.    On arrival to ED, Code Stroke activated, CT head revealed extensive hypodensity in the right MCA territory, likely cytotoxic edema with 4 mm right-to-left midline shift and possible thrombus to right ICA. CT head/neck confirmed thrombus in right ICA with extension to M1.   While in ED, developed worsening lethargy and found to have worsening midline shift on repeat imaging-admitted to ICU for close neurological monitoring-placed on 3% saline which has now been discontinued   Palliative medicine was consulted for assisting with goals of care conversations. Noted initial GOC conversation had with neuro team, Dr. Iver Nestle and family, per notes risk of further edema and mortality discussed as well as potential long term effects and likely need for feeding tube. No decisions were made at that time as per note family requested time for processing  information.  Summary of counseling/coordination of care: Extensive chart review completed prior to meeting patient including labs, vital signs, imaging, progress notes, orders, and available advanced directive documents from current and previous encounters.   After reviewing the patient's chart and assessing the patient at bedside, I spoke with patient's daughter when needed at bedside as far as symptom management and plan of care.  Hospice liaison Ree Kida has just spoken with daughter in regards to next steps.  When he does shares concern that she is not able to care for patient at home, patient does not qualify for Medicaid, and cannot afford to go to a LTC with hospice to follow.  Therapeutic silence, active listening, and emotional support provided.  Symptoms assessed.  Patient endorses headache as well as low back pain.  Patient endorses that morphine helped improve headache but that it goes away quickly.  I discussed other modalities and interventions for better managing patient's discomfort and pain.  Daughter was under the impression that starting an IV would mean giving IV fluids and prolonging patient's life.  I clarified that giving IV pain medications would be just that and not adding fluids.  She was in agreement with initiating additional medications to minimize patient's pain and discomfort.  My recommendations include Robaxin and Tylenol scheduled.  Morphine remains available as needed.  Daughter shared understanding and appreciation for adjustment to medications.  Adjustments made to [facility]  Attending, RN, TOC, and hospice liaison Ree Kida made aware.  PMT will continue to  follow and support patient throughout her hospitalization.  Physical Exam Vitals reviewed.  Constitutional:      General: She is not in acute distress. HENT:     Head: Normocephalic.     Comments: C/o head Eyes:     Pupils: Pupils are equal, round, and reactive to light.  Cardiovascular:     Rate and Rhythm:  Normal rate.  Pulmonary:     Effort: Pulmonary effort is normal.  Abdominal:     Palpations: Abdomen is soft.  Musculoskeletal:     Comments: Generalized weakness  Skin:    General: Skin is warm and dry.  Neurological:     Mental Status: She is alert.     Comments: Oriented to self and situation  Psychiatric:        Mood and Affect: Mood normal.        Behavior: Behavior normal.             Total Time 50 minutes   Time spent includes: Detailed review of medical records (labs, imaging, vital signs), medically appropriate exam (mental status, respiratory, cardiac, skin), discussed with treatment team, counseling and educating patient, family and staff, documenting clinical information, medication management and coordination of care.  Samara Deist L. Bonita Quin, DNP, FNP-BC Palliative Medicine Team

## 2023-03-05 NOTE — Progress Notes (Signed)
[facility]  - [place] [facility]   PATIENT NAME: Morgan Dixon    MR#:  [id number]  DATE OF BIRTH:  [birth year]  SUBJECTIVE:  daughter and son at bedside. Patient receiving IV morphine as needed for headache and back pain. She is currently resting quietly.   VITALS:  Blood pressure (!) 207/67, pulse (!) 46, temperature 98.5 F (36.9 C), resp. rate 16, height 5\' 6"  (1.676 m), weight 109 kg, SpO2 98%.  PHYSICAL EXAMINATION:  Limited exam GENERAL:  72 y.o.-year-old patient with no acute distress.  LUNGS: Normal breath sounds bilaterally NEUROLOGIC: left side dense Hemiplegia with right gaze  LABORATORY PANEL:  CBC Recent Labs  Lab 03/03/23 0421  WBC 7.1  HGB 11.8*  HCT 35.8*  PLT 136*    Chemistries  Recent Labs  Lab 02/28/23 1518 02/28/23 1536 03/04/23 0335 03/04/23 0804  NA 140   < >  --  153*  K 3.9   < >  --  3.2*  CL 108   < >  --  119*  CO2 23   < >  --  21*  GLUCOSE 164*   < >  --  138*  BUN 22   < >  --  31*  CREATININE 0.93   < >  --  0.76  CALCIUM 9.4   < >  --  9.4  MG  --    < > 2.5*  --   AST 33  --   --   --   ALT 17  --   --   --   ALKPHOS 68  --   --   --   BILITOT 2.2*  --   --   --    < > = values in this interval not displayed.    RADIOLOGY:  No results found.  Assessment and Plan  72 yo female with a PMH significant for prior left MCA territory cryptogenic embolic stroke with no deficits, loop recorder in place, chronic bradycardia (typical heart rate in the 50's), spinal cord stimulator in place for chronic pain, hypertension, hyperlipidemia, and obesity s/p gastrectomy.  She presented to [facility] ER via EMS from [facility] originally EMS notified for a fall.  However, when EMS arrived on the scene pt noted to have altered mental status, slurred speech, and left sided neglect with right sided deviation.  Her last known well time was on 11/13 at 1700.   CT head on admission extensive hypodensity in the  right MCA territory, likely cytotoxic edema from acute infarct, with 4 mm right-to-left midline shift. Possible petechial hemorrhage. No evidence of right hemorrhagic transformation. ASPECTS is 0. 2. Hyperdense right MCA, as well as a possible hyperdense right ICA, concerning for thrombus.  CT angiogram head and neck 1. Findings concerning for thrombus in the right cavernous and supraclinoid ICA, which may extend into the right M1, with minimal opacification of the right MCA and its branches. 2. The right A1 is not opacified but more distal ACAs receive flow from the anterior communicating artery. 3. No hemodynamically significant stenosis in the neck.  Acute encephalopathy secondary to cytotoxic edema from acute right MCA infarct with right to left midline shift Thrombus in the right ICA with extension into M1 history of left MCA territory crypto genic embolic stroke -- patient seen by neurology. Received 3% hypertonic saline secondary to edema. Hypertonic saline on hold given elevated sodium. -- Patient now transition to full comfort care  Chronic  bradycardia history of loop recorder placement couple years ago -- patient used to follow-up [facility] cardiology  Palliative Care NP input appreciated Overall poor prognosis given extent of Stroke  Discussed with patient's daughter Morgan Dixon and son at bedside. There are in agreement with moving forward with full comfort care. Continue full comfort care orders. [facility] hospice has been following    Family communication :Dter Morgan Dixon and pt's son Consults :Neurology CODE STATUS: DNR DNI DVT Prophylaxis :SCD Level of care: Med-Surg Status is: Inpatient Remains inpatient appropriate because: Massive CVA  TOC for discharge planning to long-term care with hospice overall poor prognosis  TOTAL TIME TAKING CARE OF THIS PATIENT: 25 minutes.  >50% time spent on counselling and coordination of care  Note: This dictation was prepared with  Dragon dictation along with smaller phrase technology. Any transcriptional errors that result from this process are unintentional.  Enedina Finner M.D    [facility]   CC: Primary care physician; Mick Sell, MD

## 2023-03-06 DIAGNOSIS — I639 Cerebral infarction, unspecified: Secondary | ICD-10-CM | POA: Diagnosis not present

## 2023-03-06 DIAGNOSIS — Z515 Encounter for palliative care: Secondary | ICD-10-CM | POA: Diagnosis not present

## 2023-03-06 DIAGNOSIS — I63231 Cerebral infarction due to unspecified occlusion or stenosis of right carotid arteries: Secondary | ICD-10-CM | POA: Diagnosis not present

## 2023-03-06 MED ORDER — ACETAMINOPHEN 10 MG/ML IV SOLN
1000.0000 mg | Freq: Four times a day (QID) | INTRAVENOUS | Status: AC
  Administered 2023-03-06 – 2023-03-07 (×4): 1000 mg via INTRAVENOUS
  Filled 2023-03-06 (×4): qty 100

## 2023-03-06 NOTE — Plan of Care (Signed)
Education provided to pt about medications and her plan of care.  Unclear what pt is actually retaining.  Problem: Pain Management: Goal: General experience of comfort will improve Outcome: Progressing   Problem: Safety: Goal: Ability to remain free from injury will improve Outcome: Progressing   Problem: Skin Integrity: Goal: Risk for impaired skin integrity will decrease Outcome: Progressing

## 2023-03-06 NOTE — Progress Notes (Signed)
Palliative Care Progress Note, Assessment & Plan   Patient Name: Morgan Dixon       Date: 03/06/2023 DOB: Mar 28, 1951  Age: 72 y.o. MRN#: [id number] Attending Physician: Lurene Shadow, MD Primary Care Physician: Mick Sell, MD Admit Date: 02/28/2023  Subjective: Patient is lying in flat in bed in no apparent distress.  She is awake, acknowledges my presence, and is able to make her wishes known.  No family or friends present during my visit.  HPI: 72 y.o. 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 HLD and obesity s/p gastrectomy admitted from [facility] ALF on 02/28/2023 s/p fall at facility but on EMS arrival she was found to have AMS, slurred speech and left sided neglect.    On arrival to ED, Code Stroke activated, CT head revealed extensive hypodensity in the right MCA territory, likely cytotoxic edema with 4 mm right-to-left midline shift and possible thrombus to right ICA. CT head/neck confirmed thrombus in right ICA with extension to M1.   While in ED, developed worsening lethargy and found to have worsening midline shift on repeat imaging-admitted to ICU for close neurological monitoring-placed on 3% saline which has now been discontinued   Palliative medicine was consulted for assisting with goals of care conversations. Noted initial GOC conversation had with neuro team, Dr. Iver Nestle and family, per notes risk of further edema and mortality discussed as well as potential long term effects and likely need for feeding tube. No decisions were made at that time as per note family requested time for processing information.  Summary of  counseling/coordination of care: Extensive chart review completed prior to meeting patient including labs, vital signs, imaging, progress notes, orders, and available advanced directive documents from current and previous encounters.   After reviewing the patient's chart and assessing the patient at bedside, I discussed into management and next steps with patient.  Symptoms assessed.  Patient continues to endorse low back pain with associated headache.  I discussed the adjustments I made as far as IV Tylenol and Robaxin yesterday.  She endorses that they help but that they wore off.  In review of MAR, IV Tylenol was only ordered for 12 hours.  She has not had a dose recently.  IV Tylenol reordered and patient notified that it would be continues to help minimize her pain.  She continues to have right upper and lower extremity shaking/spasms.  Patient endorses they are more annoying than anything else.  She has had doses of Ativan which she endorses have helped minimize the restlessness.  I counseled with attending and RN and encouraged more frequent use of Ativan to address these symptoms.  No further adjustment to [facility] needed at this time.  Patient remains n.p.o. and unable to take medications by mouth.  She is requiring multiple IV medications for symptom management.  I recommended reevaluation by hospice inpatient unit for placement.  Attending made aware.  PMT remains available to patient and family throughout her hospitalization.  PMT will continue to monitor and support.  Physical Exam Vitals reviewed.  Constitutional:  General: She is not in acute distress.    Appearance: She is normal weight.  HENT:     Head: Normocephalic.     Mouth/Throat:     Mouth: Mucous membranes are moist.  Eyes:     Pupils: Pupils are equal, round, and reactive to light.  Pulmonary:     Effort: Pulmonary effort is normal.  Abdominal:     Palpations: Abdomen is soft.  Musculoskeletal:     Comments:  Right sided UE and LE shaking/spasms  Skin:    General: Skin is warm and dry.     Coloration: Skin is pale.  Neurological:     Mental Status: She is alert.  Psychiatric:        Mood and Affect: Mood normal.        Behavior: Behavior normal.             Total Time 35 minutes   Time spent includes: Detailed review of medical records (labs, imaging, vital signs), medically appropriate exam (mental status, respiratory, cardiac, skin), discussed with treatment team, counseling and educating patient, family and staff, documenting clinical information, medication management and coordination of care.  Samara Deist L. Bonita Quin, DNP, FNP-BC Palliative Medicine Team

## 2023-03-06 NOTE — Progress Notes (Addendum)
Progress Note    Morgan Dixon  ZOX:[id number] DOB: 10-Aug-1950  DOA: 02/28/2023 PCP: Mick Sell, MD      Brief Narrative:    Medical records reviewed and are as summarized below:  Morgan Dixon is a 72 y.o. female with PMH significant for prior left MCA territory cryptogenic embolic stroke with no deficits, loop recorder in place, chronic bradycardia (typical heart rate in the 50's), spinal cord stimulator in place for chronic pain, hypertension, hyperlipidemia, and obesity s/p gastrectomy.  She presented to [facility] ER via EMS from [facility] originally EMS notified for a fall.  However, when EMS arrived on the scene pt noted to have altered mental status, slurred speech, and left sided neglect with right sided deviation.  Her last known well time was on 11/13 at 1700.       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  (Obesity)   Acute right MCA embolic stroke (infarct) with right-to-left midline shift, acute encephalopathy secondary to cytotoxic edema, thrombus in the right ICA with extension into L1, history of left MCA cryptogenic embolic stroke: Patient with right-sided gaze preference,, left-sided hemispatial neglect, slurred speech and left hemiplegia. She has been transitioned to comfort care. She was evaluated by the hospice team but she is deemed not to be a candidate for comfort care at the hospice house at this time.  Awaiting placement to long-term care facility.   Chronic bradycardia, history of loop recorder placement   Hypertension   Continue comfort measures.  Follow-up with palliative care and hospice team.  Follow-up with TOC to assist with disposition.  Diet Order             Diet NPO time specified  Diet effective now                            Consultants: Neurologist Palliative  care  Procedures: None    Medications:    methocarbamol (ROBAXIN) injection  500 mg Intravenous Q6H   Continuous Infusions:  acetaminophen 1,000 mg (03/06/23 1204)     Anti-infectives (From admission, onward)    None              Family Communication/Anticipated D/C date and plan/Code Status   DVT prophylaxis:      Code Status: Do not attempt resuscitation (DNR) - Comfort care  Family Communication: None Disposition Plan: Plan to discharge to long-term care facility   Status is: Inpatient Remains inpatient appropriate because: Acute stroke on comfort care       Subjective:   Interval events noted.  She is confused and cannot provide any history.  Objective:    Vitals:   03/04/23 2041 03/05/23 0813 03/05/23 2025 03/06/23 0753  BP: (!) 174/123 (!) 207/67 (!) 168/85 (!) 199/85  Pulse:   (!) 54 (!) 52  Resp: 16 16 18 18   Temp: 97.8 F (36.6 C) 98.5 F (36.9 C) 98.3 F (36.8 C) 98.2 F (36.8 C)  TempSrc:      SpO2: 98% 98% 96% 100%  Weight:      Height:       No data found.   Intake/Output Summary (Last 24 hours) at 03/06/2023 1504 Last data filed at 03/05/2023 1734 Gross per 24 hour  Intake --  Output 550 ml  Net -550 ml  Filed Weights   03/02/23 0448 03/03/23 0402 03/04/23 0345  Weight: 108 kg 109 kg 109 kg    Exam:  GEN: NAD SKIN: Warm and dry EYES: No pallor or icterus ENT: MMM CV: RRR PULM: CTA B ABD: soft, obese, NT, +BS CNS: AAO x 1 (person), right-sided gaze preference, left-sided hemispatial neglect, slurred speech, dysphasic, left-sided hemiplegia EXT: No edema or tenderness        Data Reviewed:   I have personally reviewed following labs and imaging studies:  Labs: Labs show the following:   Basic Metabolic Panel: Recent Labs  Lab 03/01/23 0440 03/01/23 0932 03/02/23 0316 03/02/23 0952 03/02/23 1807 03/03/23 0838 03/03/23 1904 03/04/23 0335 03/04/23 0804  NA 150*   < > 154* 155* 155* 152*  152*  --  153*  K 3.5  --  3.3*  --  3.4* 3.2* 3.1*  --  3.2*  CL 121*  --  126*  --  125* 121* 121*  --  119*  CO2 19*  --  21*  --  22 21* 22  --  21*  GLUCOSE 178*  --  165*  --  144* 144* 122*  --  138*  BUN 23  --  32*  --  32* 31* 28*  --  31*  CREATININE 0.71  --  0.83  --  0.86 0.78 0.78  --  0.76  CALCIUM 9.1  --  9.3  --  9.3 9.2 9.2  --  9.4  MG 2.3  --  2.5*  --   --   --   --  2.5*  --   PHOS 2.8  --  2.8  --   --   --   --   --   --    < > = values in this interval not displayed.   GFR Estimated Creatinine Clearance: 79.5 mL/min (by C-G formula based on SCr of 0.76 mg/dL). Liver Function Tests: Recent Labs  Lab 02/28/23 1518 03/01/23 0440  AST 33  --   ALT 17  --   ALKPHOS 68  --   BILITOT 2.2*  --   PROT 7.7  --   ALBUMIN 4.3 3.7   No results for input(s): "LIPASE", "AMYLASE" in the last 168 hours. No results for input(s): "AMMONIA" in the last 168 hours. Coagulation profile Recent Labs  Lab 02/28/23 1518  INR 1.2    CBC: Recent Labs  Lab 02/28/23 1518 03/01/23 0440 03/02/23 0316 03/03/23 0421  WBC 10.1 4.9 5.7 7.1  NEUTROABS 8.8*  --   --   --   HGB 13.1 11.5* 11.1* 11.8*  HCT 39.5 33.9* 33.4* 35.8*  MCV 94.0 90.9 94.6 94.2  PLT 184 150 122* 136*   Cardiac Enzymes: No results for input(s): "CKTOTAL", "CKMB", "CKMBINDEX", "TROPONINI" in the last 168 hours. BNP (last 3 results) No results for input(s): "PROBNP" in the last 8760 hours. CBG: Recent Labs  Lab 03/03/23 1955 03/03/23 2316 03/04/23 0344 03/04/23 0718 03/04/23 1115  GLUCAP 106* 107* 122* 125* 127*   D-Dimer: No results for input(s): "DDIMER" in the last 72 hours. Hgb A1c: No results for input(s): "HGBA1C" in the last 72 hours. Lipid Profile: No results for input(s): "CHOL", "HDL", "LDLCALC", "TRIG", "CHOLHDL", "LDLDIRECT" in the last 72 hours. Thyroid function studies: No results for input(s): "TSH", "T4TOTAL", "T3FREE", "THYROIDAB" in the last 72 hours.  Invalid  input(s): "FREET3" Anemia work up: No results for input(s): "VITAMINB12", "FOLATE", "FERRITIN", "TIBC", "IRON", "RETICCTPCT" in the  last 72 hours. Sepsis Labs: Recent Labs  Lab 02/28/23 1518 03/01/23 0440 03/02/23 0316 03/03/23 0421  WBC 10.1 4.9 5.7 7.1    Microbiology Recent Results (from the past 240 hour(s))  MRSA Next Gen by PCR, Nasal     Status: None   Collection Time: 02/28/23  7:03 PM   Specimen: Nasal Mucosa; Nasal Swab  Result Value Ref Range Status   MRSA by PCR Next Gen NOT DETECTED NOT DETECTED Final    Comment: (NOTE) The GeneXpert MRSA Assay (FDA approved for NASAL specimens only), is one component of a comprehensive MRSA colonization surveillance program. It is not intended to diagnose MRSA infection nor to guide or monitor treatment for MRSA infections. Test performance is not FDA approved in patients less than 46 years old. Performed at [facility], [address] [place], [place] [address]     Procedures and diagnostic studies:  No results found.             LOS: 6 days   Sherika Kubicki  [facility]   Pager on [email or web address]. If 7PM-7AM, please contact night-coverage at [email or web address]     03/06/2023, 3:04 PM

## 2023-03-07 DIAGNOSIS — R531 Weakness: Secondary | ICD-10-CM

## 2023-03-07 DIAGNOSIS — I63231 Cerebral infarction due to unspecified occlusion or stenosis of right carotid arteries: Secondary | ICD-10-CM | POA: Diagnosis not present

## 2023-03-07 DIAGNOSIS — I639 Cerebral infarction, unspecified: Secondary | ICD-10-CM | POA: Diagnosis not present

## 2023-03-07 DIAGNOSIS — Z515 Encounter for palliative care: Secondary | ICD-10-CM | POA: Diagnosis not present

## 2023-03-07 MED ORDER — ACETAMINOPHEN 10 MG/ML IV SOLN
1000.0000 mg | Freq: Four times a day (QID) | INTRAVENOUS | Status: DC
  Administered 2023-03-07: 1000 mg via INTRAVENOUS
  Filled 2023-03-07 (×3): qty 100

## 2023-03-07 MED ORDER — ACETAMINOPHEN 10 MG/ML IV SOLN
1000.0000 mg | Freq: Four times a day (QID) | INTRAVENOUS | Status: DC
  Filled 2023-03-07 (×2): qty 100

## 2023-03-07 NOTE — Progress Notes (Signed)
Progress Note    [name]  ZOX:[id number] DOB: [birth year]  DOA: 02/28/2023 PCP: Morgan Sell, MD      Brief Narrative:    Medical records reviewed and are as summarized below:  Morgan Dixon is a 72 y.o. female with PMH significant for prior left MCA territory cryptogenic embolic stroke with no deficits, loop recorder in place, chronic bradycardia (typical heart rate in the 50's), spinal cord stimulator in place for chronic pain, hypertension, hyperlipidemia, and obesity s/p gastrectomy.  She presented to [facility] ER via EMS from [facility] originally EMS notified for a fall.  However, when EMS arrived on the scene pt noted to have altered mental status, slurred speech, and left sided neglect with right sided deviation.  Her last known well time was on 11/13 at 1700.       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  (Obesity)   Acute right MCA embolic stroke (infarct) with right-to-left midline shift, acute encephalopathy secondary to cytotoxic edema, thrombus in the right ICA with extension into L1, history of left MCA cryptogenic embolic stroke: Patient with aphasia, right-sided gaze preference,, left-sided hemispatial neglect, slurred speech and left hemiplegia. She has been transitioned to comfort care. She was evaluated by the hospice team but she is deemed not to be a candidate for comfort care at the hospice house at this time.  Awaiting placement to long-term care facility.   Chronic bradycardia, history of loop recorder placement   Hypertension   Continue comfort measures.  Her condition appears to have worsened within the last 24 hours.  She stopped speaking. Prognosis is poor.  I think she would be appropriate for hospice home at this time.  Hospice team has been asked to reassess the patient to determine candidacy for hospice  house.    Diet Order             Diet NPO time specified  Diet effective now                            Consultants: Neurologist Palliative care  Procedures: None    Medications:    methocarbamol (ROBAXIN) injection  500 mg Intravenous Q6H   Continuous Infusions:  acetaminophen 1,000 mg (03/07/23 1414)     Anti-infectives (From admission, onward)    None              Family Communication/Anticipated D/C date and plan/Code Status   DVT prophylaxis:      Code Status: Do not attempt resuscitation (DNR) - Comfort care  Family Communication: None Disposition Plan: Plan to discharge to long-term care facility   Status is: Inpatient Remains inpatient appropriate because: Acute stroke on comfort care       Subjective:   Interval events noted.  She is aphasic and unable to speak this morning (at the time of my visit).  Objective:    Vitals:   03/05/23 2025 03/06/23 0753 03/07/23 0820 03/07/23 0851  BP: (!) 168/85 (!) 199/85 (!) 178/66 (!) 193/84  Pulse: (!) 54 (!) 52 (!) 55 (!) 53  Resp: 18 18 20 16   Temp: 98.3 F (36.8 C) 98.2 F (36.8 C)  (!) 97.4 F (36.3 C)  TempSrc:      SpO2: 96% 100%  97%  Weight:  Height:       No data found.   Intake/Output Summary (Last 24 hours) at 03/07/2023 1415 Last data filed at 03/07/2023 0003 Gross per 24 hour  Intake 300.05 ml  Output --  Net 300.05 ml   Filed Weights   03/02/23 0448 03/03/23 0402 03/04/23 0345  Weight: 108 kg 109 kg 109 kg    Exam:   GEN: NAD SKIN: Warm and dry EYES: No pallor or icterus ENT: MMM CV: RRR PULM: CTA B ABD: soft, obese, NT, +BS CNS: Alert, aphasic, right-sided gaze preference, left hemispatial neglect, left-sided hemiplegia EXT: No edema or tenderness       Data Reviewed:   I have personally reviewed following labs and imaging studies:  Labs: Labs show the following:   Basic Metabolic Panel: Recent Labs  Lab  03/01/23 0440 03/01/23 0932 03/02/23 0316 03/02/23 0952 03/02/23 1807 03/03/23 0838 03/03/23 1904 03/04/23 0335 03/04/23 0804  NA 150*   < > 154* 155* 155* 152* 152*  --  153*  K 3.5  --  3.3*  --  3.4* 3.2* 3.1*  --  3.2*  CL 121*  --  126*  --  125* 121* 121*  --  119*  CO2 19*  --  21*  --  22 21* 22  --  21*  GLUCOSE 178*  --  165*  --  144* 144* 122*  --  138*  BUN 23  --  32*  --  32* 31* 28*  --  31*  CREATININE 0.71  --  0.83  --  0.86 0.78 0.78  --  0.76  CALCIUM 9.1  --  9.3  --  9.3 9.2 9.2  --  9.4  MG 2.3  --  2.5*  --   --   --   --  2.5*  --   PHOS 2.8  --  2.8  --   --   --   --   --   --    < > = values in this interval not displayed.   GFR Estimated Creatinine Clearance: 79.5 mL/min (by C-G formula based on SCr of 0.76 mg/dL). Liver Function Tests: Recent Labs  Lab 02/28/23 1518 03/01/23 0440  AST 33  --   ALT 17  --   ALKPHOS 68  --   BILITOT 2.2*  --   PROT 7.7  --   ALBUMIN 4.3 3.7   No results for input(s): "LIPASE", "AMYLASE" in the last 168 hours. No results for input(s): "AMMONIA" in the last 168 hours. Coagulation profile Recent Labs  Lab 02/28/23 1518  INR 1.2    CBC: Recent Labs  Lab 02/28/23 1518 03/01/23 0440 03/02/23 0316 03/03/23 0421  WBC 10.1 4.9 5.7 7.1  NEUTROABS 8.8*  --   --   --   HGB 13.1 11.5* 11.1* 11.8*  HCT 39.5 33.9* 33.4* 35.8*  MCV 94.0 90.9 94.6 94.2  PLT 184 150 122* 136*   Cardiac Enzymes: No results for input(s): "CKTOTAL", "CKMB", "CKMBINDEX", "TROPONINI" in the last 168 hours. BNP (last 3 results) No results for input(s): "PROBNP" in the last 8760 hours. CBG: Recent Labs  Lab 03/03/23 1955 03/03/23 2316 03/04/23 0344 03/04/23 0718 03/04/23 1115  GLUCAP 106* 107* 122* 125* 127*   D-Dimer: No results for input(s): "DDIMER" in the last 72 hours. Hgb A1c: No results for input(s): "HGBA1C" in the last 72 hours. Lipid Profile: No results for input(s): "CHOL", "HDL", "LDLCALC", "TRIG",  "CHOLHDL", "LDLDIRECT" in the  last 72 hours. Thyroid function studies: No results for input(s): "TSH", "T4TOTAL", "T3FREE", "THYROIDAB" in the last 72 hours.  Invalid input(s): "FREET3" Anemia work up: No results for input(s): "VITAMINB12", "FOLATE", "FERRITIN", "TIBC", "IRON", "RETICCTPCT" in the last 72 hours. Sepsis Labs: Recent Labs  Lab 02/28/23 1518 03/01/23 0440 03/02/23 0316 03/03/23 0421  WBC 10.1 4.9 5.7 7.1    Microbiology Recent Results (from the past 240 hour(s))  MRSA Next Gen by PCR, Nasal     Status: None   Collection Time: 02/28/23  7:03 PM   Specimen: Nasal Mucosa; Nasal Swab  Result Value Ref Range Status   MRSA by PCR Next Gen NOT DETECTED NOT DETECTED Final    Comment: (NOTE) The GeneXpert MRSA Assay (FDA approved for NASAL specimens only), is one component of a comprehensive MRSA colonization surveillance program. It is not intended to diagnose MRSA infection nor to guide or monitor treatment for MRSA infections. Test performance is not FDA approved in patients less than 2 years old. Performed at [facility], [address] [place], [place] [address]     Procedures and diagnostic studies:  No results found.             LOS: 7 days   Corlette Ciano  [facility]   Pager on [email or web address]. If 7PM-7AM, please contact night-coverage at [email or web address]     03/07/2023, 2:15 PM

## 2023-03-07 NOTE — Discharge Summary (Signed)
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 *  Hospital Course:  Morgan Dixon is a 72 y.o. female with PMH significant for prior left MCA territory cryptogenic embolic stroke with no deficits, loop recorder in place, chronic bradycardia (typical heart rate in the 50's), spinal cord stimulator in place for chronic pain, hypertension, hyperlipidemia, and obesity s/p gastrectomy.  She presented to [facility] ER via EMS from [facility] originally EMS notified for a fall.  However, when EMS arrived on the scene pt noted to have altered mental status, slurred speech, and left sided neglect with right sided deviation.  Her last known well time was on 11/13 at 1700.         Assessment and Plan:  Acute right MCA embolic stroke (infarct) with right-to-left midline shift, acute encephalopathy secondary to cytotoxic edema, thrombus in the right ICA with extension into L1, history of left MCA cryptogenic embolic stroke: Patient with aphasia, right-sided gaze preference,, left-sided hemispatial neglect, slurred speech and left hemiplegia. She has been transitioned to comfort care. She was evaluated by the hospice team and she is deemed eligible for discharge to the hospice house today.    Other comorbidities include hypertension, chronic bradycardia, history of loop recorder placement    Discharge plan with Morgan Dixon, over the phone.  She agrees with the plan.          Consultants: Neurologist, palliative care team Procedures  performed: None Disposition: Hospice care Diet recommendation:  No safe diet at this time DISCHARGE MEDICATION: Allergies as of 03/07/2023       Reactions   Bupropion Nausea And Vomiting   Diazepam Rash   Penicillins Itching   Rofecoxib Swelling   Latex Itching   When used gloves in the past   Metformin Other (See Comments)   Prozac [fluoxetine Hcl] Itching, Rash   Zinc Oxide [mexsana] Rash        Medication List     STOP taking these medications    acetaminophen 500 MG tablet Commonly known as: TYLENOL   atorvastatin 40 MG tablet Commonly known as: LIPITOR   buprenorphine 8 MG Subl SL tablet Commonly known as: SUBUTEX   clopidogrel 75 MG tablet Commonly known as: PLAVIX   hydrochlorothiazide 25 MG tablet Commonly known as: HYDRODIURIL   losartan 100 MG tablet Commonly known as: COZAAR   ondansetron 4 MG disintegrating tablet Commonly known as: ZOFRAN-ODT   pregabalin 100 MG capsule Commonly known as: LYRICA   sertraline 100 MG tablet Commonly known as: ZOLOFT   tiZANidine 4 MG tablet Commonly known as: ZANAFLEX        Discharge Exam: Filed Weights   03/02/23 0448 03/03/23 0402 03/04/23 0345  Weight: 108 kg 109 kg 109 kg   GEN: NAD SKIN: Warm and dry EYES: No pallor or icterus ENT: MMM CV: RRR PULM: CTA B ABD: soft, obese, NT, +BS CNS: Alert, aphasic, right-sided gaze preference, left hemispatial neglect, left-sided hemiplegia EXT: No edema or tenderness    Condition at discharge: poor  The results  of significant diagnostics from this hospitalization (including imaging, microbiology, ancillary and laboratory) are listed below for reference.   Imaging Studies: CT HEAD WO CONTRAST ( )  Result Date: 03/02/2023 CLINICAL DATA:  Altered mental status with stroke. Follow-up midline shift EXAM: CT HEAD WITHOUT CONTRAST TECHNIQUE: Contiguous axial images were obtained from the base of the skull through the vertex without intravenous  contrast. RADIATION DOSE REDUCTION: This exam was performed according to the departmental dose-optimization program which includes automated exposure control, adjustment of the mA and/or kV according to patient size and/or use of iterative reconstruction technique. COMPARISON:  Left yesterday FINDINGS: Brain: Acute infarct involving the right MCA distribution with cytotoxic edema causing local mass effect and minimal midline shift. No hydrocephalus or entrapment. Moderate to large remote left MCA branch infarct centered at the parietal lobe. No acute hemorrhage, hydrocephalus, or masslike finding Vascular: Stable Skull: Normal. Negative for fracture or focal lesion. Sinuses/Orbits: Negative IMPRESSION: Stable swelling at the right MCA distribution infarct. Midline shift is minimal. Electronically Signed   By: Tiburcio Pea M.D.   On: 03/02/2023 09:26   ECHOCARDIOGRAM COMPLETE  Result Date: 03/01/2023    ECHOCARDIOGRAM REPORT   Patient Name:   Morgan Dixon Date of Exam: 03/01/2023 Medical Rec #:  [id number]         Height:       66.0 in Accession #:    [id number]        Weight:       240.3 lb Date of Birth:  October 16, 1950         BSA:          2.162 m Patient Age:    72 years          BP:           187/65 mmHg Patient Gender: F                 HR:           49 bpm. Exam Location:  [facility] Procedure: 2D Echo, Cardiac Doppler, Color Doppler and Saline Contrast Bubble            Study Indications:     Stroke I63.9  History:         Patient has prior history of Echocardiogram examinations, most                  recent 08/21/2020. No cardiac history listed in chart.  Sonographer:     Cristela Blue Referring Phys:  [id number] SRISHTI L BHAGAT Diagnosing Phys: Mellody Drown Alluri IMPRESSIONS  1. Left ventricular ejection fraction, by estimation, is 55 to 60%. The left ventricle has normal function. The left ventricle has no regional wall motion abnormalities. There is mild left ventricular hypertrophy. Left ventricular diastolic  parameters are indeterminate.  2. Right ventricular systolic function is normal. The right ventricular size is mildly enlarged. There is moderately elevated pulmonary artery systolic pressure. The estimated right ventricular systolic pressure is 52.6 mmHg.  3. Left atrial size was moderately dilated.  4. The mitral valve is normal in structure. Trivial mitral valve regurgitation.  5. The aortic valve is tricuspid. Aortic valve regurgitation is not visualized.  6. Agitated saline contrast bubble study was negative, with no evidence of any interatrial shunt. FINDINGS  Left Ventricle: Left ventricular ejection fraction, by estimation, is 55 to 60%. The left ventricle has normal function. The left ventricle has no regional wall motion abnormalities. The left ventricular internal cavity size was normal in size.  There is  mild left ventricular hypertrophy. Left ventricular diastolic parameters are indeterminate. Right Ventricle: The right ventricular size is mildly enlarged. No increase in right ventricular wall thickness. Right ventricular systolic function is normal. There is moderately elevated pulmonary artery systolic pressure. The tricuspid regurgitant velocity is 3.45 m/s, and with an assumed right atrial pressure of 5 mmHg, the estimated right ventricular systolic pressure is 52.6 mmHg. Left Atrium: Left atrial size was moderately dilated. Right Atrium: Right atrial size was normal in size. Pericardium: There is no evidence of pericardial effusion. Mitral Valve: The mitral valve is normal in structure. Mild mitral annular calcification. Trivial mitral valve regurgitation. Tricuspid Valve: The tricuspid valve is normal in structure. Tricuspid valve regurgitation is mild. Aortic Valve: The aortic valve is tricuspid. Aortic valve regurgitation is not visualized. Aortic valve mean gradient measures 5.0 mmHg. Aortic valve peak gradient measures 11.0 mmHg. Aortic valve area, by VTI measures 2.38 cm. Pulmonic Valve: The  pulmonic valve was not well visualized. Pulmonic valve regurgitation is not visualized. Aorta: The aortic root is normal in size and structure. Venous: The inferior vena cava was not well visualized. IAS/Shunts: No atrial level shunt detected by color flow Doppler. Agitated saline contrast was given intravenously to evaluate for intracardiac shunting. Agitated saline contrast bubble study was negative, with no evidence of any interatrial shunt.  LEFT VENTRICLE PLAX 2D LVIDd:         4.00 cm   Diastology LVIDs:         2.95 cm   LV e' medial:    11.00 cm/s LV PW:         1.10 cm   LV E/e' medial:  12.7 LV IVS:        1.10 cm   LV e' lateral:   9.46 cm/s LVOT diam:     2.00 cm   LV E/e' lateral: 14.8 LV SV:         96 LV SV Index:   45 LVOT Area:     3.14 cm  RIGHT VENTRICLE RV Basal diam:  4.90 cm RV Mid diam:    4.50 cm LEFT ATRIUM              Index        RIGHT ATRIUM           Index LA diam:        4.10 cm  1.90 cm/m   RA Area:     16.50 cm LA Vol (A2C):   114.0 ml 52.73 ml/m  RA Volume:   40.90 ml  18.92 ml/m LA Vol (A4C):   64.7 ml  29.92 ml/m LA Biplane Vol: 92.0 ml  42.55 ml/m  AORTIC VALVE AV Area (Vmax):    2.21 cm AV Area (Vmean):   2.04 cm AV Area (VTI):     2.38 cm AV Vmax:           166.00 cm/s AV Vmean:          106.000 cm/s AV VTI:            0.405 m AV Peak Grad:      11.0 mmHg AV Mean Grad:      5.0 mmHg LVOT Vmax:         117.00 cm/s LVOT Vmean:        68.800 cm/s LVOT VTI:          0.307 m LVOT/AV VTI ratio: 0.76  AORTA Ao Root diam: 2.80 cm MITRAL VALVE  TRICUSPID VALVE MV Area (PHT): 3.99 cm     TR Peak grad:   47.6 mmHg MV Decel Time: 190 msec     TR Vmax:        345.00 cm/s MV E velocity: 140.00 cm/s MV A velocity: 61.30 cm/s   SHUNTS MV E/A ratio:  2.28         Systemic VTI:  0.31 m                             Systemic Diam: 2.00 cm Windell Norfolk Electronically signed by Windell Norfolk Signature Date/Time: 03/01/2023/5:30:33 PM    Final    [place] EKG SITE  RITE  Result Date: 03/01/2023 If Site Rite image not attached, placement could not be confirmed due to current cardiac rhythm.  CT HEAD WO CONTRAST ( )  Result Date: 03/01/2023 CLINICAL DATA:  stroke, follow up midline shift. EXAM: CT HEAD WITHOUT CONTRAST TECHNIQUE: Contiguous axial images were obtained from the base of the skull through the vertex without intravenous contrast. RADIATION DOSE REDUCTION: This exam was performed according to the departmental dose-optimization program which includes automated exposure control, adjustment of the mA and/or kV according to patient size and/or use of iterative reconstruction technique. COMPARISON:  Head CT 02/28/2023. FINDINGS: Brain: Continued evolution of a likely subacute large right MCA territory infarct. No significant change in associated edema or minimal leftward midline shift. No acute hemorrhage. Unchanged encephalomalacia from prior left parietal infarcts with cortical laminar necrosis along the medial margin. No new loss of gray-white differentiation. No hydrocephalus or extra-axial collection. Vascular: Unchanged hyperdense right MCA. Skull: No calvarial fracture or suspicious bone lesion. Skull base is unremarkable. Sinuses/Orbits: No acute finding. Other: None. IMPRESSION: 1. Continued evolution of a likely subacute large right MCA territory infarct. No significant change in associated edema or minimal leftward midline shift. No acute hemorrhage. 2. Unchanged hyperdense right MCA. Electronically Signed   By: Orvan Falconer M.D.   On: 03/01/2023 08:36   CT HEAD WO CONTRAST ( )  Result Date: 02/28/2023 CLINICAL DATA:  Previous stroke code, worsening mental status EXAM: CT HEAD WITHOUT CONTRAST TECHNIQUE: Contiguous axial images were obtained from the base of the skull through the vertex without intravenous contrast. RADIATION DOSE REDUCTION: This exam was performed according to the departmental dose-optimization program which includes  automated exposure control, adjustment of the mA and/or kV according to patient size and/or use of iterative reconstruction technique. COMPARISON:  02/28/2023 CT head 3:30 p.m. FINDINGS: Brain: Redemonstrated extensive hypodensity in the right MCA territory, most likely cytotoxic edema, which may have increased compared to the prior exam. Although there is not significantly more effacement of the right lateral ventricle, there appears to be 7 mm of right-to-left midline shift at the level of the thalami, previously 4 mm. No evidence of frank hemorrhagic transformation, although some hyperdensity within the area of infarct may represent petechial hemorrhage. The basilar cisterns remain patent. No evidence of additional acute infarct, mass, hydrocephalus, or extra-axial collection. Vascular: Redemonstrated hyperdense right ICA and MCA. Skull: Negative for fracture or focal lesion. Sinuses/Orbits: No acute finding. Other: The mastoid air cells are well aerated. IMPRESSION: Redemonstrated cytotoxic edema from acute infarct, with 7 mm of right-to-left midline shift at the level of the thalami, previously 4 mm. No evidence of hemorrhagic transformation. Possible petechial hemorrhage. Code stroke imaging results were communicated on 02/28/2023 at 5:09 pm to provider BHAGAT via secure text paging. Electronically Signed   By: Jill Side  Vasan M.D.   On: 02/28/2023 17:10   CT ANGIO HEAD NECK W WO CM  Result Date: 02/28/2023 CLINICAL DATA:  Stroke code EXAM: CT ANGIOGRAPHY HEAD AND NECK WITH AND WITHOUT CONTRAST TECHNIQUE: Multidetector CT imaging of the head and neck was performed using the standard protocol during bolus administration of intravenous contrast. Multiplanar CT image reconstructions and MIPs were obtained to evaluate the vascular anatomy. Carotid stenosis measurements (when applicable) are obtained utilizing NASCET criteria, using the distal internal carotid diameter as the denominator. RADIATION DOSE  REDUCTION: This exam was performed according to the departmental dose-optimization program which includes automated exposure control, adjustment of the mA and/or kV according to patient size and/or use of iterative reconstruction technique. CONTRAST:  75mL OMNIPAQUE IOHEXOL 350 MG/ML SOLN COMPARISON:  CTA 08/20/2020, correlation is also made with CT head 02/28/2023 FINDINGS: CT HEAD FINDINGS For noncontrast findings, please see same day CT head. CTA NECK FINDINGS Aortic arch: Two-vessel arch with a common origin of the brachiocephalic and left common carotid arteries. Imaged portion shows no evidence of aneurysm or dissection. No significant stenosis of the major arch vessel origins. Right carotid system: The right common and external carotid arteries are patent, without evidence of stenosis or dissection. Diminishing opacification of the right extracranial ICA as it approaches the skull base (series 7, image 132), without focal stenosis. Left carotid system: No evidence of dissection, occlusion, or hemodynamically significant stenosis (greater than 50%). Vertebral arteries: No evidence of dissection, occlusion, or hemodynamically significant stenosis (greater than 50%). Skeleton: No acute osseous abnormality. Degenerative changes in the cervical spine. Other neck: No acute finding. Upper chest: No focal pulmonary opacity or pleural effusion. Review of the MIP images confirms the above findings CTA HEAD FINDINGS Anterior circulation: Diminishing opacification of the right intracranial ICA, with contrast outlining what is likely thrombus in the right cavernous and supraclinoid segment (series 6, image 127 series 6, image 120). The left ICA is patent to the terminus without significant stenosis. Nonopacification of the right A1. The left A1 is patent. Normal anterior communicating artery. Anterior cerebral arteries are patent to their distal aspects without significant stenosis. Thrombus may extend into the right M1,  with minimal opacification of right MCA branches. The left M1 and its branches are patent without significant stenosis. Posterior circulation: Vertebral arteries patent to the vertebrobasilar junction without significant stenosis. Posterior inferior cerebellar arteries patent proximally. Basilar patent to its distal aspect without significant stenosis. Superior cerebellar arteries patent proximally. Patent P1 segments. PCAs perfused to their distal aspects without significant stenosis. The bilateral posterior communicating arteries are not visualized. Venous sinuses: As permitted by contrast timing, patent. Anatomic variants: None significant. No evidence of aneurysm or vascular malformation. Review of the MIP images confirms the above findings IMPRESSION: 1. Findings concerning for thrombus in the right cavernous and supraclinoid ICA, which may extend into the right M1, with minimal opacification of the right MCA and its branches. 2. The right A1 is not opacified but more distal ACAs receive flow from the anterior communicating artery. 3. No hemodynamically significant stenosis in the neck. Code stroke imaging results were communicated on 02/28/2023 at 3:59 pm to provider BHAGAT via secure text paging. Electronically Signed   By: Wiliam Ke M.D.   On: 02/28/2023 16:01   CT HEAD CODE STROKE WO CONTRAST  Result Date: 02/28/2023 CLINICAL DATA:  Code stroke. EXAM: CT HEAD WITHOUT CONTRAST TECHNIQUE: Contiguous axial images were obtained from the base of the skull through the vertex without intravenous contrast. RADIATION  DOSE REDUCTION: This exam was performed according to the departmental dose-optimization program which includes automated exposure control, adjustment of the mA and/or kV according to patient size and/or use of iterative reconstruction technique. COMPARISON:  05/11/2021 FINDINGS: Brain: Extensive hypodensity in the right MCA territory, involving the right frontal, temporal, and parietal lobes,  as well as the right basal ganglia, likely cytotoxic edema, with narrowing of the right lateral ventricle and third ventricle. 4 mm right-to-left midline shift some curvilinear hyperdensity within the area of hypodensity may reflect petechial hemorrhage. No evidence of hemorrhagic transformation. No mass. No extra-axial collection or hydrocephalus. Remote left frontoparietal infarct. Vascular: Hyperdense right MCA, as well as a possible hyperdense right ICA. Skull: Negative for fracture or focal lesion. Sinuses/Orbits: Minimal mucosal thickening in the ethmoid air cells. No acute finding in the orbits. Other: The mastoid air cells are well aerated. ASPECTS [facility] Stroke Program Early CT Score) - Ganglionic level infarction (caudate, lentiform nuclei, internal capsule, insula, M1-M3 cortex): 0 - Supraganglionic infarction (M4-M6 cortex): 0 Total score (0-10 with 10 being normal): 0 IMPRESSION: 1. Extensive hypodensity in the right MCA territory, likely cytotoxic edema from acute infarct, with 4 mm right-to-left midline shift. Possible petechial hemorrhage. No evidence of right hemorrhagic transformation. ASPECTS is 0. 2. Hyperdense right MCA, as well as a possible hyperdense right ICA, concerning for thrombus. Code stroke imaging results were communicated on 02/28/2023 at 3:37 pm to provider BHAGAT via secure text paging. Electronically Signed   By: Wiliam Ke M.D.   On: 02/28/2023 15:40    Microbiology: Results for orders placed or performed during the hospital encounter of 02/28/23  MRSA Next Gen by PCR, Nasal     Status: None   Collection Time: 02/28/23  7:03 PM   Specimen: Nasal Mucosa; Nasal Swab  Result Value Ref Range Status   MRSA by PCR Next Gen NOT DETECTED NOT DETECTED Final    Comment: (NOTE) The GeneXpert MRSA Assay (FDA approved for NASAL specimens only), is one component of a comprehensive MRSA colonization surveillance program. It is not intended to diagnose MRSA infection nor to  guide or monitor treatment for MRSA infections. Test performance is not FDA approved in patients less than 70 years old. Performed at [facility], [address] [place], [place] [address]     Labs: CBC: Recent Labs  Lab 03/01/23 0440 03/02/23 0316 03/03/23 0421  WBC 4.9 5.7 7.1  HGB 11.5* 11.1* 11.8*  HCT 33.9* 33.4* 35.8*  MCV 90.9 94.6 94.2  PLT 150 122* 136*   Basic Metabolic Panel: Recent Labs  Lab 03/01/23 0440 03/01/23 0932 03/02/23 0316 03/02/23 0952 03/02/23 1807 03/03/23 0838 03/03/23 1904 03/04/23 0335 03/04/23 0804  NA 150*   < > 154* 155* 155* 152* 152*  --  153*  K 3.5  --  3.3*  --  3.4* 3.2* 3.1*  --  3.2*  CL 121*  --  126*  --  125* 121* 121*  --  119*  CO2 19*  --  21*  --  22 21* 22  --  21*  GLUCOSE 178*  --  165*  --  144* 144* 122*  --  138*  BUN 23  --  32*  --  32* 31* 28*  --  31*  CREATININE 0.71  --  0.83  --  0.86 0.78 0.78  --  0.76  CALCIUM 9.1  --  9.3  --  9.3 9.2 9.2  --  9.4  MG 2.3  --  2.5*  --   --   --   --  2.5*  --   PHOS 2.8  --  2.8  --   --   --   --   --   --    < > = values in this interval not displayed.   Liver Function Tests: Recent Labs  Lab 03/01/23 0440  ALBUMIN 3.7   CBG: Recent Labs  Lab 03/03/23 1955 03/03/23 2316 03/04/23 0344 03/04/23 0718 03/04/23 1115  GLUCAP 106* 107* 122* 125* 127*    Discharge time spent: greater than 30 minutes.  Signed: Lurene Shadow, MD [facility] 03/07/2023

## 2023-03-07 NOTE — TOC Transition Note (Signed)
Transition of Care [facility]) - CM/SW Discharge Note   Patient Details  Name: Morgan Dixon MRN: [id number] Date of Birth: [birth year]  Transition of Care [facility]) CM/SW Contact:  Garret Reddish, RN Phone Number: 03/07/2023, 3:56 PM   Clinical Narrative:    Chart reviewed. Patient will be a discharge for today.  Patient's daughter has requested Hospice Consult with [facility]. I have asked Annice Pih with [facility] to consult on patient.     Annice Pih reports that patient is appropriate for  Hospice Home. Patient's daughter is agreeable to patient being transferred to [facility].      I have made staff nurse aware.     Final next level of care: Hospice Medical Facility Barriers to Discharge: No Barriers Identified   Patient Goals and CMS Choice CMS Medicare.gov Compare Post Acute Care list provided to::  (Patient's daughter)    Discharge Placement                  Patient to be transferred to facility by: [facility]   Patient and family notified of of transfer: 03/07/23  Discharge Plan and Services Additional resources added to the After Visit Summary for                                       Social Determinants of Health (SDOH) Interventions SDOH Screenings   Food Insecurity: No Food Insecurity (12/29/2021)  Housing: Patient Unable To Answer (03/04/2023)  Transportation Needs: No Transportation Needs (12/29/2021)  Utilities: Not At Risk (12/29/2021)  Depression (PHQ2-9): Low Risk  (11/15/2020)  Tobacco Use: Low Risk  (02/28/2023)     Readmission Risk Interventions    08/19/2020    2:01 PM  Readmission Risk Prevention Plan  Medication Screening Complete  Transportation Screening Complete

## 2023-03-07 NOTE — Plan of Care (Signed)
Report called to [facility] @ hospice home.  Problem: Education: Goal: Knowledge of General Education information will improve Description: Including pain rating scale, medication(s)/side effects and non-pharmacologic comfort measures Outcome: Not Progressing   Problem: Health Behavior/Discharge Planning: Goal: Ability to manage health-related needs will improve Outcome: Not Progressing   Problem: Clinical Measurements: Goal: Ability to maintain clinical measurements within normal limits will improve Outcome: Not Progressing Goal: Diagnostic test results will improve Outcome: Not Progressing   Problem: Activity: Goal: Risk for activity intolerance will decrease Outcome: Not Progressing   Problem: Nutrition: Goal: Adequate nutrition will be maintained Outcome: Not Progressing   Problem: Coping: Goal: Level of anxiety will decrease Outcome: Not Progressing   Problem: Elimination: Goal: Will not experience complications related to bowel motility Outcome: Not Progressing   Problem: Pain Management: Goal: General experience of comfort will improve Outcome: Not Progressing   Problem: Safety: Goal: Ability to remain free from injury will improve Outcome: Not Progressing   Problem: Skin Integrity: Goal: Risk for impaired skin integrity will decrease Outcome: Not Progressing   Problem: Education: Goal: Knowledge of disease or condition will improve Outcome: Not Progressing Goal: Knowledge of secondary prevention will improve (MUST DOCUMENT ALL) Outcome: Not Progressing Goal: Knowledge of patient specific risk factors will improve Loraine Leriche N/A or DELETE if not current risk factor) Outcome: Not Progressing   Problem: Ischemic Stroke/TIA Tissue Perfusion: Goal: Complications of ischemic stroke/TIA will be minimized Outcome: Not Progressing   Problem: Coping: Goal: Will verbalize positive feelings about self Outcome: Not Progressing Goal: Will identify appropriate support  needs Outcome: Not Progressing   Problem: Health Behavior/Discharge Planning: Goal: Ability to manage health-related needs will improve Outcome: Not Progressing Goal: Goals will be collaboratively established with patient/family Outcome: Not Progressing   Problem: Self-Care: Goal: Ability to participate in self-care as condition permits will improve Outcome: Not Progressing Goal: Verbalization of feelings and concerns over difficulty with self-care will improve Outcome: Not Progressing Goal: Ability to communicate needs accurately will improve Outcome: Not Progressing   Problem: Nutrition: Goal: Risk of aspiration will decrease Outcome: Not Progressing Goal: Dietary intake will improve Outcome: Not Progressing   Problem: Education: Goal: Knowledge of the prescribed therapeutic regimen will improve Outcome: Not Progressing   Problem: Coping: Goal: Ability to identify and develop effective coping behavior will improve Outcome: Not Progressing   Problem: Clinical Measurements: Goal: Quality of life will improve Outcome: Not Progressing   Problem: Respiratory: Goal: Verbalizations of increased ease of respirations will increase Outcome: Not Progressing   Problem: Role Relationship: Goal: Family's ability to cope with current situation will improve Outcome: Not Progressing Goal: Ability to verbalize concerns, feelings, and thoughts to partner or family member will improve Outcome: Not Progressing   Problem: Pain Management: Goal: Satisfaction with pain management regimen will improve Outcome: Not Progressing

## 2023-03-07 NOTE — Progress Notes (Addendum)
Patient transferred to [facility] with EMS, v/s done belongings packed and taken by family, IV remains in place. Pt responds to pain, no distress noted. Report already given receiving facility by AM RN, Report and documents given to EMS transport. Pt left unit at 2023.

## 2023-03-07 NOTE — Plan of Care (Signed)
[name] current situation doesn't allow for adequate educating, and family is not available to offer updates or information to on night shift.  Pt is stable.  Problem: Clinical Measurements: Goal: Ability to maintain clinical measurements within normal limits will improve Outcome: Progressing Goal: Diagnostic test results will improve Outcome: Progressing   Problem: Safety: Goal: Ability to remain free from injury will improve Outcome: Progressing   Problem: Skin Integrity: Goal: Risk for impaired skin integrity will decrease Outcome: Progressing   Problem: Ischemic Stroke/TIA Tissue Perfusion: Goal: Complications of ischemic stroke/TIA will be minimized Outcome: Progressing   Problem: Role Relationship: Goal: Family's ability to cope with current situation will improve Outcome: Progressing

## 2023-03-07 NOTE — Progress Notes (Addendum)
[facility]- Civil engineer, contracting [facility])    Received request from Transitions of Care Manger for family interest in [facility].  Eligibility has been confirmed.  Spoke with daughter to confirm interest and explain services.  Daughter agreeable to transfer today.  Transitions of Care manager aware.  RN please call report to [phone] [facility]) prior to patient leaving the unit.  Please send signed and completed DNR with patient at discharge.  Thank you  Redge Gainer,  [facility] Liaison 336 [phone]

## 2023-03-07 NOTE — Progress Notes (Signed)
Palliative Care Progress Note, Assessment & Plan   Patient Name: Morgan Dixon       Date: 03/07/2023 DOB: Sep 03, 1950  Age: 72 y.o. MRN#: [id number] Attending Physician: Lurene Shadow, MD Primary Care Physician: Mick Sell, MD Admit Date: 02/28/2023  Subjective: Patient is lying in bed in mild distress.  Her respirations are even but right is increased.  She acknowledges my presence and is able to answer simple questions by nodding her head or saying yes or no.  She endorses mild anxiety, feeling uncomfortable, experiencing low back pain, and feeling very sweaty.  No family or friends present during my visit.  HPI: 72 y.o. 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 HLD and obesity s/p gastrectomy admitted from [facility] on 02/28/2023 s/p fall at facility but on EMS arrival she was found to have AMS, slurred speech and left sided neglect.    On arrival to ED, Code Stroke activated, CT head revealed extensive hypodensity in the right MCA territory, likely cytotoxic edema with 4 mm right-to-left midline shift and possible thrombus to right ICA. CT head/neck confirmed thrombus in right ICA with extension to M1.   While in ED, developed worsening lethargy and found to have worsening midline shift on repeat imaging-admitted to ICU for close neurological monitoring-placed on 3% saline which has now been discontinued   Palliative medicine was consulted for assisting with goals of care conversations. Noted initial GOC conversation had with neuro team, Dr. Iver Nestle and family, per notes risk of further edema and mortality discussed as well as potential long term  effects and likely need for feeding tube. No decisions were made at that time as per note family requested time for processing information.  11/18 - Family decided to shift to full comfort measures.   Summary of counseling/coordination of care: Extensive chart review completed prior to meeting patient including labs, vital signs, imaging, progress notes, orders, and available advanced directive documents from current and previous encounters.   After reviewing the patient's chart and assessing the patient at bedside, discussed symptom management and plan of care with patient.  Pain and discomfort assessed.  Patient has low back pain as well as a headache.  She endorses feeling somewhat worried and anxious about being in pain and not being able to get comfortable or feel settled.  She is also sweaty to touch and looks flushed.  She endorses feeling hot and then cold.  Patient readjusted in bed for comfort.  In review of MAR, patient has Ativan and morphine available but has not been given any today.  Advised RN to give dose of Ativan and morphine now.  I reordered IV Tylenol to help with body temperature dysregulation.  I continue to believe patient is appropriate for evaluation for hospice inpatient unit.  She is requiring multiple IVs for symptom management will continue to require IV or subcu route given she is n.p.o.  PMT will continue to follow and support patient throughout her hospitalization.  Full comfort measures continue.  Physical Exam Vitals reviewed.  Constitutional:      General: She  is in acute distress.     Appearance: She is ill-appearing.  HENT:     Mouth/Throat:     Mouth: Mucous membranes are dry.  Cardiovascular:     Rate and Rhythm: Normal rate.     Pulses: Normal pulses.  Abdominal:     Palpations: Abdomen is soft.  Musculoskeletal:     Comments: Generalized weakness, spasms/tremors of right LE  Neurological:     Mental Status: She is alert.  Psychiatric:         Mood and Affect: Mood normal.        Behavior: Behavior normal.        Judgment: Judgment normal.             Total Time 50 minutes   Time spent includes: Detailed review of medical records (labs, imaging, vital signs), medically appropriate exam (mental status, respiratory, cardiac, skin), discussed with treatment team, counseling and educating patient, family and staff, documenting clinical information, medication management and coordination of care.  Samara Deist L. Bonita Quin, DNP, FNP-BC Palliative Medicine Team

## 2023-03-07 NOTE — Progress Notes (Signed)
Paged for family support. Daughter of pt requesting prayer as her mom was being moved to hospice. Shared sadness around her moms diagnosis after her stroke. Wanted comfort and peace for her.appreciative of chaplain support-encouraged her to reach out to chaplains in hospice if she wanted

## 2023-03-17 DEATH — deceased

## 2023-08-30 IMAGING — CT CT HEAD W/O CM
4 series · 16 of 47 positions shown, 18 images · non-contrast
Comparison: Prior head CT examinations 09/01/2020 and earlier.

CLINICAL DATA: Dizziness, nonspecific. Additional history provided:
Worsening weakness, previous stroke.

EXAM:
CT HEAD WITHOUT CONTRAST
TECHNIQUE: Contiguous axial images were obtained from the base of the skull
through the vertex without intravenous contrast.

[Series 2: head wo · axial · 0.44mm/px · z∈[-146,-26]mm · 7 of 33 slices shown, 9 images]
[im 5/33  brain]
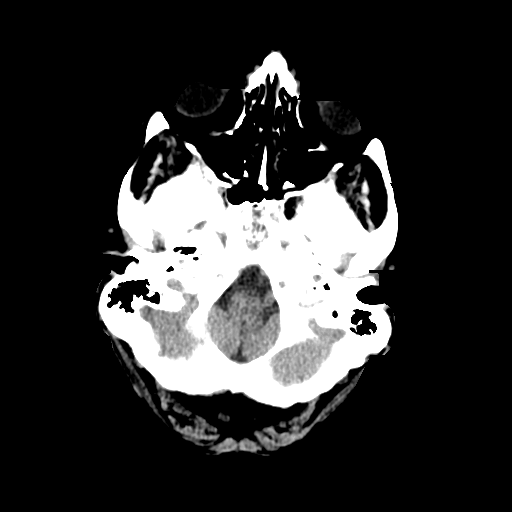
[im 5/33  bone]
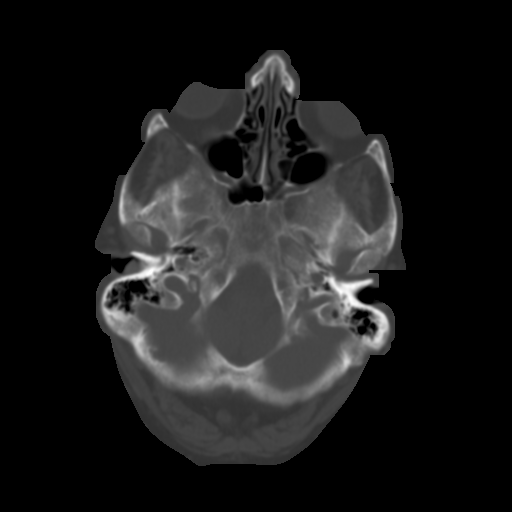
[im 9/33  brain]
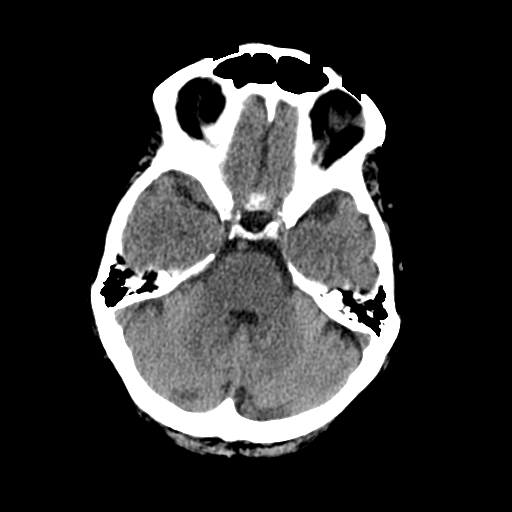
[im 13/33  brain]
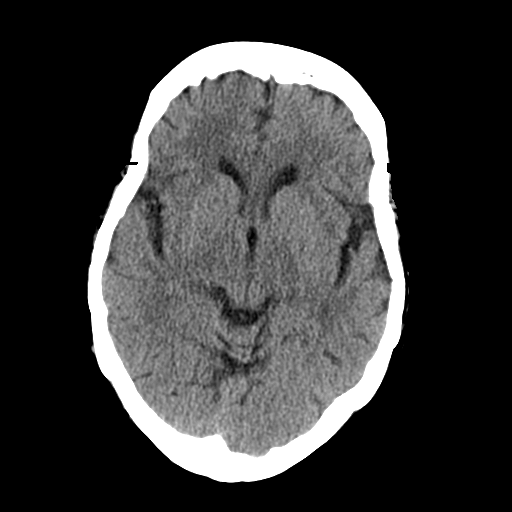
[im 17/33  brain]
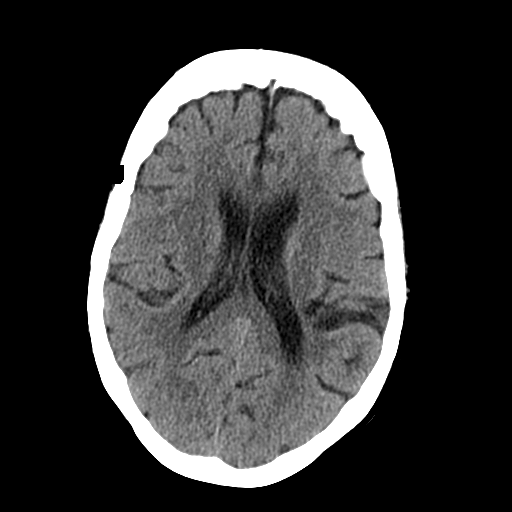
[im 21/33  brain]
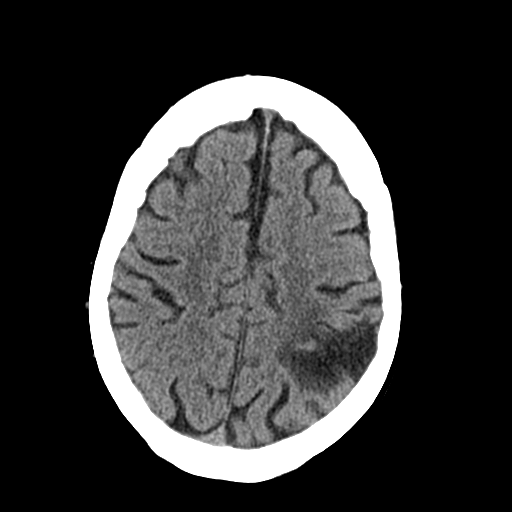
[im 21/33  bone]
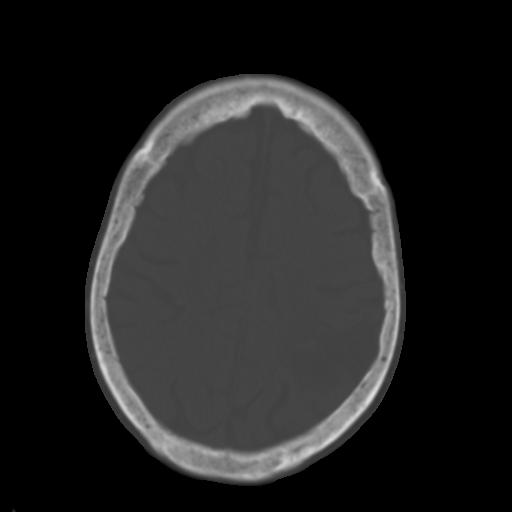
[im 25/33  brain]
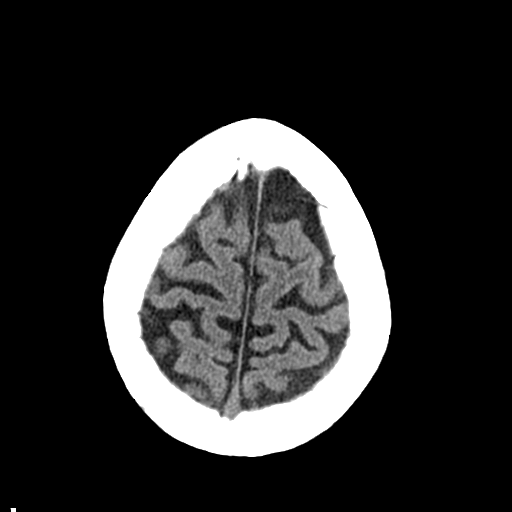
[im 29/33  brain]
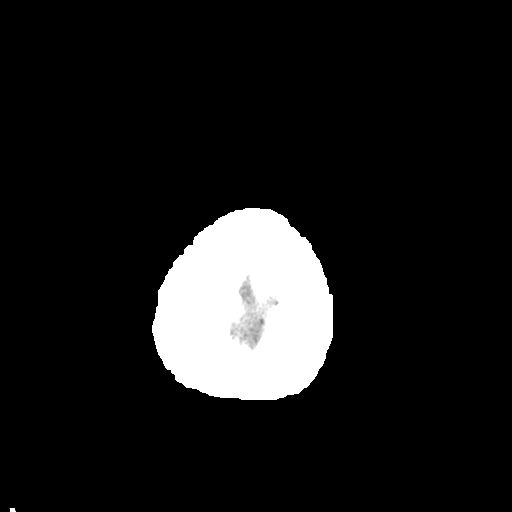

[Series 3: head bone · axial · 0.44mm/px · z∈[-150,-118]mm · 3 of 83 slices shown]
[im 9/83  bone]
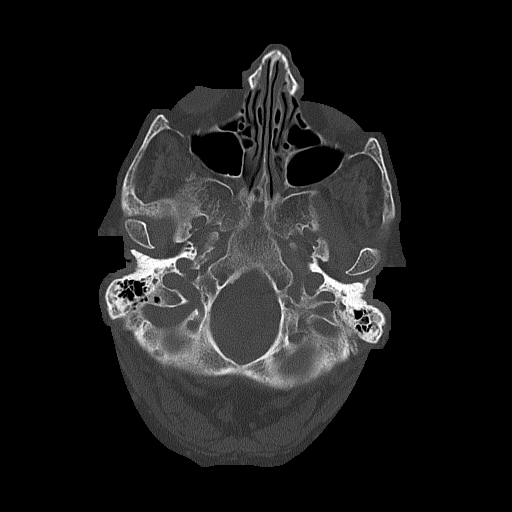
[im 17/83  bone]
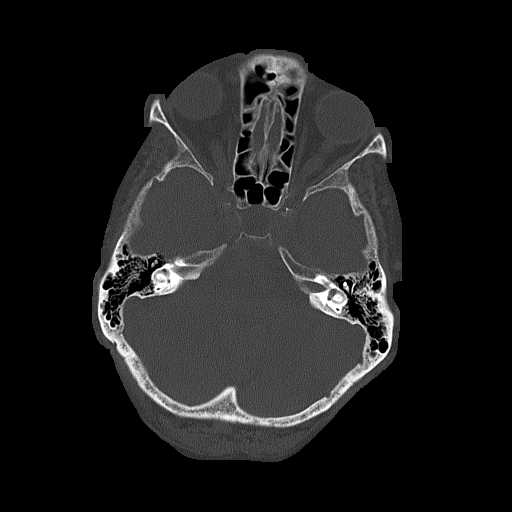
[im 25/83  bone]
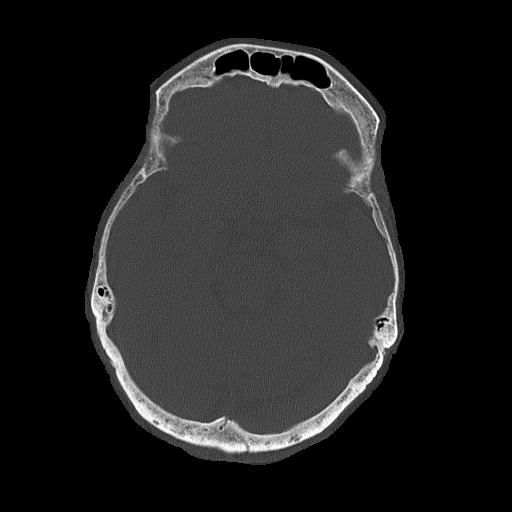

[Series 4: coronal soft tissue · coronal · 0.33mm/px · 3 of 72 slices shown]
[im 24/72  brain]
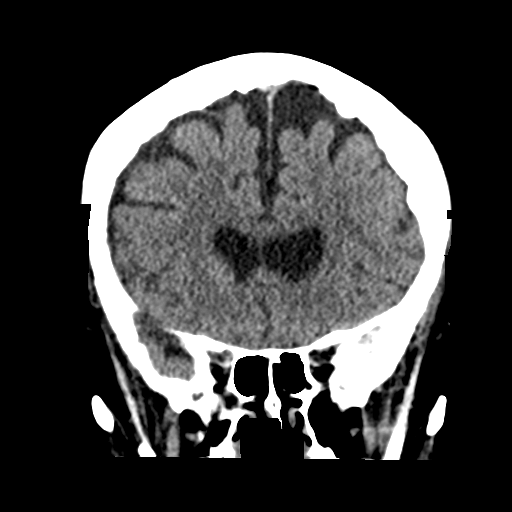
[im 32/72  brain]
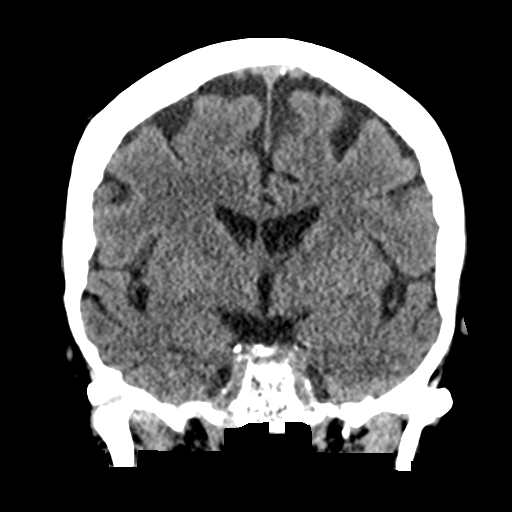
[im 40/72  brain]
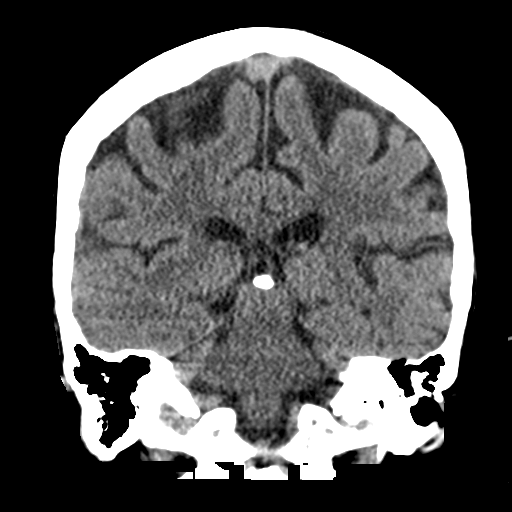

[Series 5: sagittal soft tissue · sagittal · 0.33mm/px · 3 of 55 slices shown]
[im 19/55  brain]
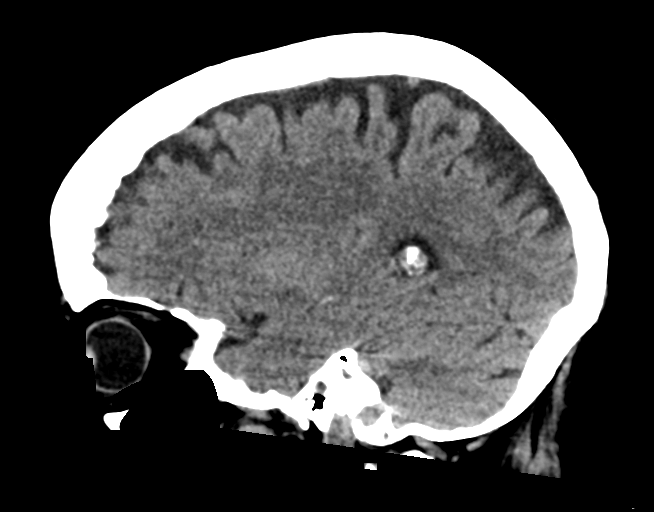
[im 28/55  brain]
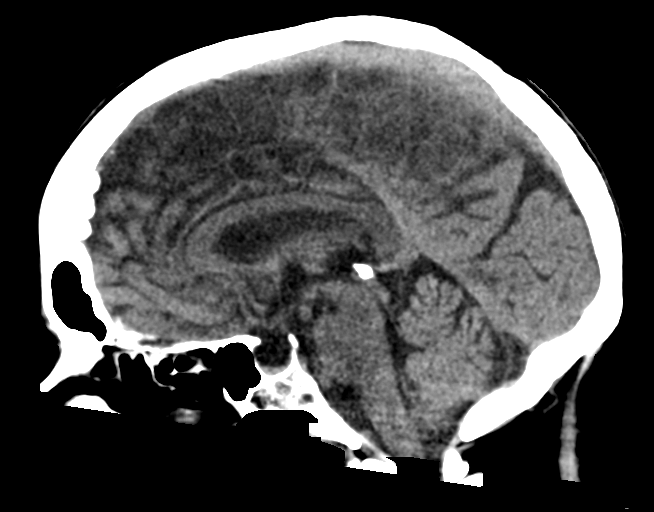
[im 37/55  brain]
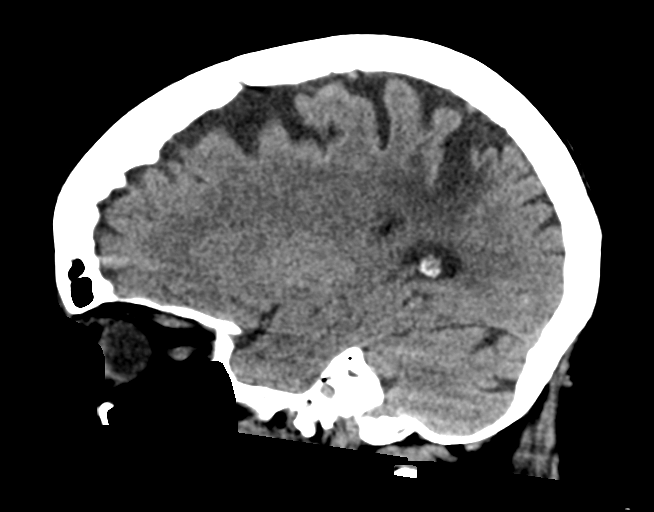

[16 of 47 positions shown; findings below may reference images not displayed]

FINDINGS: Brain:

Moderate-sized chronic cortical/subcortical infarct within the left
frontal and parietal lobes. This infarct was subacute at time of the
prior head CT of 09/01/2020.

There is no acute intracranial hemorrhage.

No demarcated cortical infarct.

No extra-axial fluid collection.

No evidence of an intracranial mass.

No midline shift.

Partially empty sella turcica.

Vascular: No hyperdense vessel.  Atherosclerotic calcifications.

Skull: Normal. Negative for fracture or focal lesion.

Sinuses/Orbits: Visualized orbits show no acute finding. Trace left
frontal and bilateral ethmoid sinus mucosal thickening.
IMPRESSION: No evidence of acute intracranial abnormality.

Moderate-sized chronic cortical/subcortical infarct within the left
frontal and parietal lobes (left MCA vascular territory). This
infarct was subacute at time of the prior head CT on 09/01/2020.
